# Patient Record
Sex: Male | Born: 1935 | Race: White | Hispanic: No | Smoking: Former smoker
Health system: Southern US, Community
[De-identification: ages and names within clinical notes are randomized; demographics above are authoritative.]

## PROBLEM LIST (undated history)

## (undated) DIAGNOSIS — Z950 Presence of cardiac pacemaker: Secondary | ICD-10-CM

## (undated) DIAGNOSIS — E782 Mixed hyperlipidemia: Secondary | ICD-10-CM

## (undated) DIAGNOSIS — I739 Peripheral vascular disease, unspecified: Secondary | ICD-10-CM

## (undated) DIAGNOSIS — I10 Essential (primary) hypertension: Secondary | ICD-10-CM

## (undated) DIAGNOSIS — F99 Mental disorder, not otherwise specified: Secondary | ICD-10-CM

## (undated) DIAGNOSIS — J309 Allergic rhinitis, unspecified: Secondary | ICD-10-CM

## (undated) DIAGNOSIS — E1129 Type 2 diabetes mellitus with other diabetic kidney complication: Secondary | ICD-10-CM

## (undated) DIAGNOSIS — R531 Weakness: Secondary | ICD-10-CM

## (undated) DIAGNOSIS — I442 Atrioventricular block, complete: Secondary | ICD-10-CM

## (undated) DIAGNOSIS — E1165 Type 2 diabetes mellitus with hyperglycemia: Secondary | ICD-10-CM

## (undated) DIAGNOSIS — D638 Anemia in other chronic diseases classified elsewhere: Secondary | ICD-10-CM

## (undated) DIAGNOSIS — IMO0001 Reserved for inherently not codable concepts without codable children: Secondary | ICD-10-CM

## (undated) DIAGNOSIS — D696 Thrombocytopenia, unspecified: Secondary | ICD-10-CM

## (undated) DIAGNOSIS — G629 Polyneuropathy, unspecified: Secondary | ICD-10-CM

## (undated) DIAGNOSIS — F0390 Unspecified dementia without behavioral disturbance: Secondary | ICD-10-CM

## (undated) DIAGNOSIS — J45909 Unspecified asthma, uncomplicated: Secondary | ICD-10-CM

## (undated) DIAGNOSIS — N400 Enlarged prostate without lower urinary tract symptoms: Secondary | ICD-10-CM

## (undated) DIAGNOSIS — I5042 Chronic combined systolic (congestive) and diastolic (congestive) heart failure: Secondary | ICD-10-CM

## (undated) HISTORY — PX: ROTATOR CUFF REPAIR: SHX139

## (undated) HISTORY — DX: Mixed hyperlipidemia: E78.2

## (undated) HISTORY — DX: Anemia in other chronic diseases classified elsewhere: D63.8

## (undated) HISTORY — DX: Unspecified asthma, uncomplicated: J45.909

## (undated) HISTORY — DX: Chronic combined systolic (congestive) and diastolic (congestive) heart failure: I50.42

## (undated) HISTORY — DX: Type 2 diabetes mellitus with hyperglycemia: E11.65

## (undated) HISTORY — DX: Allergic rhinitis, unspecified: J30.9

## (undated) HISTORY — DX: Presence of cardiac pacemaker: Z95.0

## (undated) HISTORY — DX: Polyneuropathy, unspecified: G62.9

## (undated) HISTORY — DX: Atrioventricular block, complete: I44.2

## (undated) HISTORY — DX: Weakness: R53.1

## (undated) HISTORY — DX: Peripheral vascular disease, unspecified: I73.9

## (undated) HISTORY — DX: Type 2 diabetes mellitus with other diabetic kidney complication: E11.29

## (undated) HISTORY — DX: Thrombocytopenia, unspecified: D69.6

## (undated) HISTORY — DX: Benign prostatic hyperplasia without lower urinary tract symptoms: N40.0

## (undated) HISTORY — DX: Essential (primary) hypertension: I10

## (undated) HISTORY — DX: Reserved for inherently not codable concepts without codable children: IMO0001

---

## 2000-06-16 ENCOUNTER — Ambulatory Visit (HOSPITAL_COMMUNITY): Admission: RE | Admit: 2000-06-16 | Discharge: 2000-06-16 | Payer: Self-pay | Admitting: Gastroenterology

## 2002-08-17 ENCOUNTER — Emergency Department (HOSPITAL_COMMUNITY): Admission: EM | Admit: 2002-08-17 | Discharge: 2002-08-17 | Payer: Self-pay | Admitting: Emergency Medicine

## 2002-11-29 ENCOUNTER — Observation Stay (HOSPITAL_COMMUNITY): Admission: RE | Admit: 2002-11-29 | Discharge: 2002-11-30 | Payer: Self-pay | Admitting: Orthopedic Surgery

## 2004-12-02 ENCOUNTER — Ambulatory Visit: Payer: Self-pay | Admitting: Internal Medicine

## 2004-12-03 ENCOUNTER — Ambulatory Visit (HOSPITAL_COMMUNITY): Admission: RE | Admit: 2004-12-03 | Discharge: 2004-12-04 | Payer: Self-pay | Admitting: Urology

## 2005-12-10 ENCOUNTER — Ambulatory Visit: Payer: Self-pay | Admitting: Internal Medicine

## 2006-10-28 ENCOUNTER — Ambulatory Visit: Payer: Self-pay | Admitting: Internal Medicine

## 2007-07-04 ENCOUNTER — Encounter: Admission: RE | Admit: 2007-07-04 | Discharge: 2007-07-04 | Payer: Self-pay | Admitting: Internal Medicine

## 2007-07-13 ENCOUNTER — Encounter: Admission: RE | Admit: 2007-07-13 | Discharge: 2007-10-11 | Payer: Self-pay | Admitting: Internal Medicine

## 2007-11-08 DIAGNOSIS — J42 Unspecified chronic bronchitis: Secondary | ICD-10-CM | POA: Insufficient documentation

## 2007-11-08 DIAGNOSIS — J309 Allergic rhinitis, unspecified: Secondary | ICD-10-CM | POA: Insufficient documentation

## 2007-11-08 DIAGNOSIS — G609 Hereditary and idiopathic neuropathy, unspecified: Secondary | ICD-10-CM | POA: Insufficient documentation

## 2007-11-09 ENCOUNTER — Ambulatory Visit: Payer: Self-pay | Admitting: Internal Medicine

## 2007-11-17 DIAGNOSIS — J45909 Unspecified asthma, uncomplicated: Secondary | ICD-10-CM

## 2007-11-17 DIAGNOSIS — I10 Essential (primary) hypertension: Secondary | ICD-10-CM

## 2008-06-04 ENCOUNTER — Emergency Department (HOSPITAL_COMMUNITY): Admission: EM | Admit: 2008-06-04 | Discharge: 2008-06-04 | Payer: Self-pay | Admitting: Emergency Medicine

## 2008-09-22 ENCOUNTER — Encounter: Admission: RE | Admit: 2008-09-22 | Discharge: 2008-09-22 | Payer: Self-pay | Admitting: Orthopedic Surgery

## 2008-10-15 ENCOUNTER — Ambulatory Visit (HOSPITAL_COMMUNITY): Admission: RE | Admit: 2008-10-15 | Discharge: 2008-10-16 | Payer: Self-pay | Admitting: Orthopedic Surgery

## 2008-11-08 ENCOUNTER — Ambulatory Visit: Payer: Self-pay | Admitting: Internal Medicine

## 2008-12-19 ENCOUNTER — Ambulatory Visit: Payer: Self-pay | Admitting: Internal Medicine

## 2008-12-25 ENCOUNTER — Telehealth: Payer: Self-pay | Admitting: Internal Medicine

## 2009-03-03 ENCOUNTER — Ambulatory Visit: Payer: Self-pay | Admitting: Surgery

## 2009-05-07 ENCOUNTER — Encounter: Admission: RE | Admit: 2009-05-07 | Discharge: 2009-05-07 | Payer: Self-pay | Admitting: Internal Medicine

## 2009-11-10 ENCOUNTER — Telehealth: Payer: Self-pay | Admitting: Internal Medicine

## 2009-12-09 ENCOUNTER — Encounter (INDEPENDENT_AMBULATORY_CARE_PROVIDER_SITE_OTHER): Payer: Self-pay | Admitting: Internal Medicine

## 2009-12-09 ENCOUNTER — Inpatient Hospital Stay (HOSPITAL_COMMUNITY)
Admission: EM | Admit: 2009-12-09 | Discharge: 2009-12-14 | Payer: Self-pay | Source: Home / Self Care | Admitting: Internal Medicine

## 2009-12-19 ENCOUNTER — Ambulatory Visit: Payer: Self-pay | Admitting: Internal Medicine

## 2010-03-03 NOTE — Assessment & Plan Note (Signed)
Summary: 12 months/apc   Primary Provider/Referring Provider:  Dr. Selena Batten  CC:  yearly follow up visit-requests flu shot.  History of Present Illness:   From 10/28/06: 1. Allergic rhinitis. 2. Chronic bronchitis. 3. Diabetes with peripheral neuropathy.   HISTORY:  He continues followup through the [place].    Cough has been much better and he used Tussionex only in latewinter.  Does use Combivent 2 puffs t.i.d. and rarely uses nebulizer, especially if he is exerting himself off of lisinopril.  We discussed lisinopril.  11/09/07- 1 yr f/u requesting flu vac. Med talk. Walking limited by back pain, up to 2 miles per day, rides stationary bike, treadmill for 40 minutes each day.  No routine cough.  Rarely needs nebulizer.  Uses Combivent inhaler t.i.d..  Denies sputum.  Had chest x-ray at primary office.  Has had pneumococcal vaccine twice. [facility] for meds.  November 08, 2008- Allergic rhinits, chronic bronchitis, DM/ peripheral neuropathy yearly follow up visit-requests flu shot 3 weeks s/p left rotator cuff surgery without respiratory complication. His allergy complaints are mild and controlled with loratadine. He uses Combivent three times a day and neb albuterol once / month. He notices dyspnea with stable exertion level "I'm satisfied". He denies heart problem or chest pain.   Allergies (verified): 1)  ! * Altace  Past History:  Past Surgical History: bilateral rotator cuffs  cystoscopic kidney stone removal  Social History: Patient states former smoker. - 69 Married with 2 children  Still working IT trainer  Review of Systems      See HPI       The patient complains of dyspnea on exertion.  The patient denies anorexia, fever, weight loss, weight gain, vision loss, decreased hearing, hoarseness, chest pain, syncope, peripheral edema, prolonged cough, headaches, hemoptysis, abdominal pain, melena, hematochezia, severe indigestion/heartburn, suspicious skin lesions, transient blindness, abnormal  bleeding, and enlarged lymph nodes.    Vital Signs:  Patient profile:   77 year old male Weight:      196 pounds O2 Sat:      98 % on Room air Pulse rate:   83 / minute BP sitting:   154 / 96  (right arm) Cuff size:   regular  Vitals Entered By: Reynaldo Minium CMA (November 08, 2008 9:02 AM)  O2 Flow:  Room air  Physical Exam  Additional Exam:  General: A/Ox3; pleasant and cooperative, NAD, SKIN: no rash, lesions NODES: no lymphadenopathy HEENT: [place]/AT, EOM- WNL, Conjuctivae- clear, PERRLA, TM-WNL, hearing aid,, Nose- clear, Throat- clear and wnl NECK: Supple w/ fair ROM, JVD- none, normal carotid impulses w/o bruits Thyroid- normal to palpation CHEST:Few crackles, unlabored without dullness. HEART: RRR, no m/g/r heard ABDOMEN: Soft and nl; nml bowel sounds; no organomegaly or masses noted ZOX:WRUE, nl pulses, no edema, left arm sling NEURO: Grossly intact to observation      Impression & Recommendations:  Problem # 1:  ASTHMA (ICD-493.90) Fair control with stable medication. We will want to get update CXR and PFT in a month or so when he is out of his sling.  Problem # 2:  ALLERGIC RHINITIS (ICD-477.9)  Loratadine gives satisfactory control.  His updated medication list for this problem includes:    Loratadine 10 Mg Tabs (Loratadine) .Marland Kitchen... Take 1 tablet by mouth once a day    Zyrtec Allergy 10 Mg Tabs (Cetirizine hcl) .Marland Kitchen... As needed    Benadryl 25 Mg Caps (Diphenhydramine hcl) ..... Use as directed as needed  Orders: Est. Patient Level III (  52841)  Other Orders: Future Orders: T-2 View CXR (71020TC) ... 12/13/2008 Pulmonary Referral (Pulmonary) ... 12/13/2008  Patient Instructions: 1)  Schedule appointment for 1 year follow-up, earlier if needed 2)  We are setting up PFT and CXR to be done next month, when you are out of your sling. You will be able to call for those results. 3)  Flu vax

## 2010-03-03 NOTE — Assessment & Plan Note (Signed)
Summary: rov/jd   Primary Provider/Referring Provider:  Dr. Selena Batten  CC:  Follow up visit.  History of Present Illness: HISTORY:  He continues followup through the [place].    Cough has been much better and he used Tussionex only in latewinter.  Does use Combivent 2 puffs t.i.d. and rarely uses nebulizer, especially if he is exerting himself off of lisinopril.  We discussed lisinopril.  11/09/07- 1 yr f/u requesting flu vac. Med talk. Walking limited by back pain, up to 2 miles per day, rides stationary bike, treadmill for 40 minutes each day.  No routine cough.  Rarely needs nebulizer.  Uses Combivent inhaler t.i.d..  Denies sputum.  Had chest x-ray at primary office.  Has had pneumococcal vaccine twice. [facility] for meds.  November 08, 2008- Allergic rhinits, chronic bronchitis, DM/ peripheral neuropathy yearly follow up visit-requests flu shot 3 weeks s/p left rotator cuff surgery without respiratory complication. His allergy complaints are mild and controlled with loratadine. He uses Combivent three times a day and neb albuterol once / month. He notices dyspnea with stable exertion level "I'm satisfied". He denies heart problem or chest pain.   December 19, 2009- Allergic rhinits, chronic bronchitis, DM/ peripheral neuropathy.........dtr here Nurse-CC: Follow up visit Just out of hospital for diabetes control with renal insufficiency- Cr 2.0 PFT- mild small airway obstruction w/ insignif resp to BD, otw WNL. FEV1/FVC 0.67 Using Advair rarely as a rescue med- not needed. He hasn't needed his nebulizer in 6 months. Uses Combivent rarely. He denies routine cough, dyspnea with usual activity, or waking from dyspnea. Had flu shot. Daughter observes he coughs less and is not short of breath.      Asthma History    Initial Asthma Severity Rating:    Age range: 12+ years    Symptoms: 0-2 days/week    Nighttime Awakenings: 0-2/month    Interferes w/ normal activity: no limitations    SABA use (not for  EIB): 0-2 days/week    Asthma Severity Assessment: Intermittent   Preventive Screening-Counseling & Management  Alcohol-Tobacco     Smoking Status: quit     Year Quit: 05-16-1965  Allergies: 1)  ! * Altace  Past History:  Past Surgical History: Last updated: 11/08/2008 bilateral rotator cuffs  cystoscopic kidney stone removal  Family History: Last updated: 11-27-2007 Mother- deceased age 27 Father- deceased age 22 Sibling 1- living age 62 Sibling 2- living age 31 Sibling 34- living age 49  Social History: Last updated: 11/08/2008 Patient states former smoker. - 49 Married with 2 children  Still working IT trainer  Risk Factors: Smoking Status: quit (12/19/2009)  Past Medical History: Allergic Rhinitis Asthma/ chronic bronchitis- PFT 12/19/08- FEV1 2.68/ 100%, FEV1/FVC 0.67, mild reversible small airway obst. Diabetes, Type 2 Hypertension BPH  Review of Systems      See HPI  The patient denies shortness of breath with activity, shortness of breath at rest, productive cough, non-productive cough, coughing up blood, chest pain, irregular heartbeats, acid heartburn, indigestion, loss of appetite, weight change, abdominal pain, difficulty swallowing, sore throat, tooth/dental problems, headaches, nasal congestion/difficulty breathing through nose, sneezing, itching, ear ache, rash, change in color of mucus, and fever.    Vital Signs:  Patient profile:   77 year old male Weight:      180 pounds O2 Sat:      99 % on Room air Pulse rate:   81 / minute BP sitting:   110 / 64  (left arm) Cuff size:   regular  Vitals Entered By: Reynaldo Minium CMA (December 19, 2009 9:28 AM)  O2 Flow:  Room air CC: Follow up visit   Physical Exam  Additional Exam:  General: A/Ox3; pleasant and cooperative, NAD, SKIN: no rash, lesions NODES: no lymphadenopathy HEENT: [place]/AT, EOM- WNL, Conjuctivae- clear, PERRLA, TM-WNL, hearing aid,, Nose- clear, Throat- clear and wnl.m II-III NECK:  Supple w/ fair ROM, JVD- none, normal carotid impulses w/o bruits Thyroid- normal to palpation CHEST:Few crackles, unlabored without dullness. HEART: RRR, no m/g/r heard ABDOMEN:medium build ZOX:WRUE, nl pulses, no edema NEURO: Grossly intact to observation      CXR  Procedure date:  12/19/2008  Findings:      DG CHEST 2 VIEW - [id number]   Clinical Data: bronchitis   CHEST - 2 VIEW   Comparison: Chest radiograph 10/11/2008   Findings: Normal mediastinum and heart silhouette.  Costophrenic angles are clear.  No evidence effusion, infiltrates, or pneumothorax. Bronchitic markings are stable.   IMPRESSION:   1.  No acute cardiopulmonary process. 2.  Central bronchitic markings   Read By:  Genevive Bi,  M.D.     Released By:  Genevive Bi,  M.D.  Impression & Recommendations:  Problem # 1:  BRONCHITIS, CHRONIC (ICD-491.9)  He denies routine cough, phlegm, wheeze or dyspnea now and pulmonary status seems under good control. He doesn't need a maintenance inhaler.  I suggested he follow with Dr Selena Batten and I will be happy to see as needed.  Other Orders: Est. Patient Level IV (91478)  Patient Instructions: 1)  Please schedule a follow-up appointment as needed. I will be happy to see you as needed. Dr Selena Batten can manage your routine needs and refill yuour meds. 2)  You don't seem to need Advair now.  3)  Suggest you keep Combivent available as your rescue inhaler for occasional use, and a small amount of medicine for your nebulizer machine.  4)  cc Dr Selena Batten   CXR  Procedure date:  12/19/2008  Findings:      DG CHEST 2 VIEW - [id number]   Clinical Data: bronchitis   CHEST - 2 VIEW   Comparison: Chest radiograph 10/11/2008   Findings: Normal mediastinum and heart silhouette.  Costophrenic angles are clear.  No evidence effusion, infiltrates, or pneumothorax. Bronchitic markings are stable.   IMPRESSION:   1.  No acute cardiopulmonary process. 2.  Central  bronchitic markings   Read By:  Genevive Bi,  M.D.     Released By:  Genevive Bi,  M.D.

## 2010-03-03 NOTE — Progress Notes (Signed)
Summary: nos appt  Phone Note Call from Patient   Caller: [email or web address]  Call For: Rajanee Schuelke Summary of Call: Rsc nos from 10/7 to 11/18. Initial call taken by: Darletta Moll,  November 10, 2009 9:39 AM

## 2010-04-14 LAB — URINALYSIS, ROUTINE W REFLEX MICROSCOPIC
Bilirubin Urine: NEGATIVE
Glucose, UA: >1000 mg/dL — AB
Hgb urine dipstick: NEGATIVE
Ketones, ur: NEGATIVE mg/dL
Leukocytes, UA: NEGATIVE
Nitrite: NEGATIVE
Protein, ur: NEGATIVE mg/dL
Specific Gravity, Urine: 1.011 (ref 1.005–1.030)
Urobilinogen, UA: 0.2 mg/dL (ref 0.0–1.0)
pH: 5.5 (ref 5.0–8.0)

## 2010-04-14 LAB — UIFE/LIGHT CHAINS/TP QN, 24-HR UR
Albumin, U: DETECTED
Alpha 1, Urine: DETECTED — AB
Alpha 2, Urine: DETECTED — AB
Beta, Urine: DETECTED — AB
Free Kappa Lt Chains,Ur: 16.8 mg/dL — ABNORMAL HIGH (ref 0.04–1.51)
Free Lambda Lt Chains,Ur: 1.18 mg/dL — ABNORMAL HIGH (ref 0.08–1.01)
Gamma Globulin, Urine: DETECTED — AB
Total Protein, Urine: 18.7 mg/dL

## 2010-04-14 LAB — GLUCOSE, CAPILLARY
Glucose-Capillary: 101 mg/dL — ABNORMAL HIGH (ref 70–99)
Glucose-Capillary: 138 mg/dL — ABNORMAL HIGH (ref 70–99)
Glucose-Capillary: 155 mg/dL — ABNORMAL HIGH (ref 70–99)
Glucose-Capillary: 171 mg/dL — ABNORMAL HIGH (ref 70–99)
Glucose-Capillary: 173 mg/dL — ABNORMAL HIGH (ref 70–99)
Glucose-Capillary: 232 mg/dL — ABNORMAL HIGH (ref 70–99)
Glucose-Capillary: 349 mg/dL — ABNORMAL HIGH (ref 70–99)
Glucose-Capillary: 350 mg/dL — ABNORMAL HIGH (ref 70–99)
Glucose-Capillary: 393 mg/dL — ABNORMAL HIGH (ref 70–99)
Glucose-Capillary: 46 mg/dL — ABNORMAL LOW (ref 70–99)
Glucose-Capillary: 60 mg/dL — ABNORMAL LOW (ref 70–99)
Glucose-Capillary: 73 mg/dL (ref 70–99)
Glucose-Capillary: 93 mg/dL (ref 70–99)

## 2010-04-14 LAB — CBC
HCT: 43 % (ref 39.0–52.0)
Hemoglobin: 15.1 g/dL (ref 13.0–17.0)
MCH: 32.2 pg (ref 26.0–34.0)
MCHC: 35.1 g/dL (ref 30.0–36.0)
MCV: 91.7 fL (ref 78.0–100.0)
Platelets: 132 10*3/uL — ABNORMAL LOW (ref 150–400)
RBC: 4.69 MIL/uL (ref 4.22–5.81)
RDW: 13.8 % (ref 11.5–15.5)
WBC: 7.3 10*3/uL (ref 4.0–10.5)

## 2010-04-14 LAB — PROTEIN ELECTROPHORESIS, SERUM
Albumin ELP: 61.3 % (ref 55.8–66.1)
Alpha-1-Globulin: 3.8 % (ref 2.9–4.9)
Alpha-2-Globulin: 11.6 % (ref 7.1–11.8)
Beta 2: 5.3 % (ref 3.2–6.5)
Beta Globulin: 5.1 % (ref 4.7–7.2)
Gamma Globulin: 12.9 % (ref 11.1–18.8)
M-Spike, %: NOT DETECTED g/dL
Total Protein ELP: 7.5 g/dL (ref 6.0–8.3)

## 2010-04-14 LAB — COMPREHENSIVE METABOLIC PANEL
ALT: 16 U/L (ref 0–53)
AST: 20 U/L (ref 0–37)
Albumin: 4 g/dL (ref 3.5–5.2)
Alkaline Phosphatase: 73 U/L (ref 39–117)
BUN: 67 mg/dL — ABNORMAL HIGH (ref 6–23)
CO2: 23 mEq/L (ref 19–32)
Calcium: 8.9 mg/dL (ref 8.4–10.5)
Chloride: 92 mEq/L — ABNORMAL LOW (ref 96–112)
Creatinine, Ser: 6.14 mg/dL — ABNORMAL HIGH (ref 0.4–1.5)
GFR calc Af Amer: 11 mL/min — ABNORMAL LOW (ref >60)
GFR calc non Af Amer: 9 mL/min — ABNORMAL LOW (ref >60)
Glucose, Bld: 297 mg/dL — ABNORMAL HIGH (ref 70–99)
Potassium: 4 mEq/L (ref 3.5–5.1)
Sodium: 129 mEq/L — ABNORMAL LOW (ref 135–145)
Total Bilirubin: 0.9 mg/dL (ref 0.3–1.2)
Total Protein: 7.4 g/dL (ref 6.0–8.3)

## 2010-04-14 LAB — SODIUM, URINE, RANDOM: Sodium, Ur: 25 mEq/L

## 2010-04-14 LAB — BASIC METABOLIC PANEL
BUN: 43 mg/dL — ABNORMAL HIGH (ref 6–23)
CO2: 24 mEq/L (ref 19–32)
CO2: 26 mEq/L (ref 19–32)
Calcium: 8.3 mg/dL — ABNORMAL LOW (ref 8.4–10.5)
Calcium: 8.4 mg/dL (ref 8.4–10.5)
Creatinine, Ser: 1.81 mg/dL — ABNORMAL HIGH (ref 0.4–1.5)
GFR calc Af Amer: 27 mL/min — ABNORMAL LOW (ref >60)
GFR calc Af Amer: 39 mL/min — ABNORMAL LOW (ref >60)
GFR calc non Af Amer: 22 mL/min — ABNORMAL LOW (ref >60)
Glucose, Bld: 86 mg/dL (ref 70–99)
Potassium: 3.3 mEq/L — ABNORMAL LOW (ref 3.5–5.1)
Sodium: 137 mEq/L (ref 135–145)
Sodium: 138 mEq/L (ref 135–145)

## 2010-04-14 LAB — PTH-RELATED PEPTIDE

## 2010-04-14 LAB — URINE MICROSCOPIC-ADD ON

## 2010-04-14 LAB — PHOSPHORUS: Phosphorus: 6.1 mg/dL — ABNORMAL HIGH (ref 2.3–4.6)

## 2010-04-14 LAB — MAGNESIUM: Magnesium: 2.8 mg/dL — ABNORMAL HIGH (ref 1.5–2.5)

## 2010-04-14 LAB — CREATININE, URINE, RANDOM: Creatinine, Urine: 76.3 mg/dL

## 2010-04-14 LAB — CK: Total CK: 92 U/L (ref 7–232)

## 2010-05-08 LAB — PROTIME-INR
INR: 1 (ref 0.00–1.49)
Prothrombin Time: 13 seconds (ref 11.6–15.2)

## 2010-05-08 LAB — COMPREHENSIVE METABOLIC PANEL
AST: 33 U/L (ref 0–37)
Albumin: 4.5 g/dL (ref 3.5–5.2)
CO2: 25 mEq/L (ref 19–32)
Calcium: 9.4 mg/dL (ref 8.4–10.5)
Creatinine, Ser: 1.6 mg/dL — ABNORMAL HIGH (ref 0.4–1.5)
GFR calc Af Amer: 51 mL/min — ABNORMAL LOW (ref >60)
GFR calc non Af Amer: 42 mL/min — ABNORMAL LOW (ref >60)

## 2010-05-08 LAB — DIFFERENTIAL
Eosinophils Relative: 2 % (ref 0–5)
Lymphocytes Relative: 17 % (ref 12–46)
Lymphs Abs: 1 10*3/uL (ref 0.7–4.0)
Neutro Abs: 4.3 10*3/uL (ref 1.7–7.7)

## 2010-05-08 LAB — GLUCOSE, CAPILLARY
Glucose-Capillary: 143 mg/dL — ABNORMAL HIGH (ref 70–99)
Glucose-Capillary: 84 mg/dL (ref 70–99)

## 2010-05-08 LAB — URINALYSIS, ROUTINE W REFLEX MICROSCOPIC
Nitrite: NEGATIVE
Protein, ur: NEGATIVE mg/dL
Urobilinogen, UA: 0.2 mg/dL (ref 0.0–1.0)

## 2010-05-08 LAB — CBC
MCHC: 34.7 g/dL (ref 30.0–36.0)
MCV: 97.8 fL (ref 78.0–100.0)
Platelets: 138 10*3/uL — ABNORMAL LOW (ref 150–400)

## 2010-06-10 ENCOUNTER — Other Ambulatory Visit: Payer: Self-pay | Admitting: Internal Medicine

## 2010-06-10 DIAGNOSIS — E041 Nontoxic single thyroid nodule: Secondary | ICD-10-CM

## 2010-06-15 ENCOUNTER — Inpatient Hospital Stay: Admission: RE | Admit: 2010-06-15 | Payer: Self-pay | Source: Ambulatory Visit

## 2010-06-16 NOTE — Procedures (Signed)
DUPLEX DEEP VENOUS EXAM - LOWER EXTREMITY   INDICATION:  Venous stasis, blue feet.   HISTORY:  Edema:  No  Trauma/Surgery:  No  Pain:  Right lower extremity  PE:  No  Previous DVT:  No  Anticoagulants:  No  Other:  Patient complains of speckles in the bilateral lower  extremities.   DUPLEX EXAM:                CFV   SFV   PopV  PTV    GSV                R  L  R  L  R  L  R   L  R  L  Thrombosis    o  o  o  o  o  o  o   o  o  o  Spontaneous   +  +  +  +  +  +  +   +  +  +  Phasic        +  +  +  +  +  +  +   +  +  +  Augmentation  +  +  +  +  +  +  +   +  +  +  Compressible  +  +  +  +  +  +  +   +  +  +  Competent     o  o  o  o  o  o  +   +  +  +   Legend:  + - yes  o - no  p - partial  D - decreased   IMPRESSION:  1. No evidence of thrombus noted in the bilateral lower extremities.  2. Reflux is noted in the bilateral common femoral, superficial      femoral, and popliteal veins.    _____________________________  V. Charlena Cross, MD   CH/MEDQ  D:  03/04/2009  T:  03/05/2009  Job:  [id number]

## 2010-06-16 NOTE — Assessment & Plan Note (Signed)
OFFICE VISIT   WING, SCHOCH  DOB:  10/26/1928                                       03/03/2009  ZOXWR#:[id number]   REASON FOR VISIT:  Skin color change, both legs.   HISTORY:  This is a 77 year old gentleman I am seeing at the request of  Dr. Selena Batten for evaluation of hyperpigmentation and blue toes on both lower  extremities.  The patient states that this has been present for quite  some time without significant change.  He denies having any swelling.  He does complains of some pain in both of his ankles that is described  as annoying, constant and dull in nature.  He also has right hip pain.   The patient has a longstanding history of diabetes, hypertension and  hypercholesterolemia, all of which are medically managed.   FAMILY HISTORY:  Negative for cardiovascular disease at an early age.   SOCIAL HISTORY:  He is widowed with two children.  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  Does not smoke.  Has a history smoking, quit in 1967.  Does  not drink alcohol.   REVIEW OF SYSTEMS:  CARDIAC:  Positive for shortness of breath with  exertion.  VASCULAR:  Positive for pain in legs.  All other systems are negative as documented in the encounter form.   ALLERGIES:  None.   PHYSICAL EXAMINATION:  Heart rate 76, blood pressure 117/73, O2  saturation is 99%.  Generally, he is well-appearing, in no distress.  HEENT is normal.  Respirations are nonlabored.  Cardiovascular:  He has  palpable dorsalis pedis and posterior tibial pulses bilaterally and  palpable popliteal pulses.  Musculoskeletal is without major  deformities.  He does have hyperpigmentation in both lower extremities  from the knee down.  There is mild cyanosis of the toes.  Neurologically, he does not any focal deficits.  Skin is without rash  and without ulceration.   DIAGNOSTIC STUDIES:  Vein mapping was ordered and independently  reviewed.  This shows reflux in both common femoral and superficial  femoral veins.   ASSESSMENT:  Mild venous insufficiency.   PLAN:  At this point the patient does not have significant edema.  He  has hyperpigmentation in both legs.  I have talked to him about  compression stockings and leg elevation; however, without significant  swelling and/or ulceration, I do not think he would benefit from these  at this time.  Certainly, in the future if this progresses, this would  be the next course of action.  He will follow up with me on a p.r.n.  basis.     Jorge Ny, MD  Electronically Signed   VWB/MEDQ  D:  03/03/2009  T:  03/04/2009  Job:  [id number]   cc:   Massie Maroon, MD

## 2010-06-16 NOTE — Assessment & Plan Note (Signed)
[facility]                             PULMONARY OFFICE NOTE   NAME:White, Dakota YOUTZ                      MRN:          [id number]  DATE:10/28/2006                            DOB:          04/06/1928    PULMONARY FOLLOWUP   PROBLEM LIST:  1. Allergic rhinitis.  2. Chronic bronchitis.  3. Diabetes with peripheral neuropathy.   HISTORY:  He continues followup through the [place].  With Dr. Bascom Levels  retirement, he is establishing with a different physician for primary  care.  Cough has been much better and he used Tussionex only in late  winter.  Does use Combivent 2 puffs t.i.d. and rarely uses nebulizer,  especially if he is exerting himself off of lisinopril.  We discussed  lisinopril.   MEDICATIONS:  1. Nebulized albuterol 0.83 mg q.i.d. p.r.n.  2. Lantus.  3. Lisinopril 40 mg.  4. Avandia 80 mg.  5. Finasteride 5 mg.  6. Aspirin 81 mg.  7. Loratadine 10 mg p.r.n.  8. Zyrtec.  9. Testosterone.  10.Lyrica.  11.P.r.n. use of Combivent.  12.Promethazine with codeine.  13.Tussionex.   DRUG INTOLERANCES:  ALTACE.   OBJECTIVE:  Weight 198 pounds, BP 120/66, pulse 76, room air saturation  96%.  He looks comfortable and his chest is very clear.  There is no post-nasal drainage visible.  No stridor.  Voice quality is  good.  HEART:  Sounds are regular.  No cyanosis, clubbing, or edema.   IMPRESSION:  Allergic rhinitis and bronchitis.   PLAN:  Flu vaccine discussed and given.  Tussionex refilled.  Schedule  return in 1 year, earlier p.r.n.     Clinton D. Maple Hudson, MD, Tonny Bollman, FACP  Electronically Signed    CDY/MedQ  DD: 10/28/2006  DT: 10/29/2006  Job #: [id number]   cc:   Juluis Rainier, M.D., Deboraha Sprang at [facility]

## 2010-06-19 NOTE — Assessment & Plan Note (Signed)
[facility]                               PULMONARY OFFICE NOTE   NAME:Dakota White, Dakota White                      MRN:          [id number]  DATE:12/10/2005                            DOB:          1929-01-12    PULMONARY/ALLERGY FOLLOWUP   PROBLEMS:  1. Allergic rhinitis.  2. Chronic bronchitis.  3. Diabetes with peripheral neuropathy.   HISTORY:  He continues Combivent using 2 puffs b.i.d. and only very rarely  using his nebulized albuterol.  He asks to keep a prescription of Tussionex  available and I agreed after discussion.  Dr. Janey Greaser has handled some minor  bronchitis flares, but overall, Mr. Vansickle notes little change in exercise  tolerance or cough, and feels pretty functional.  He is going to water  aerobics 3 times a week.  He has had pneumococcal vaccine twice and flu  vaccine for this year.  He is satisfied with his current status.   MEDICATIONS:  1. Home nebulizer with albuterol.  2. Insulin.  3. Lisinopril 40 mg.  4. Avandia 8 mg.  5. Zocor 80 mg.  6. Finasteride 5 mg.  7. Aspirin 81 mg.  8. Loratadine 10 mg or Zyrtec.  9. Testosterone injection twice a month.  10.Lyrica.  11.Combivent rescue inhaler.  12.Phenergan with codeine cough syrup or Tussionex used very occasionally.   DRUG INTOLERANCES:  ALTACE.   OBJECTIVE:  Weight 203 pounds, BP 146/72, pulse regular 68, room air  saturation 98%.  I heard a few trace crackles right base.  There was no cough with deep  breath or forced exhalation and he looked comfortable.  HEART:  Sounds were regular with no neck vein distension or edema.  Moderate abdominal obesity.  I saw no evidence of significant nasal congestion or post-nasal drip today.   IMPRESSION:  Allergic rhinitis and asthmatic bronchitis are controlled.  We  have discussed the potential for his angiotensin-converting enzyme inhibitor  to cause cough.  We reviewed his past history of positive allergy skin  testing  without allergy vaccine trial.  He is satisfied with his present  status recognizing maintenance use of a short-term bronchodilator as  maintenance therapy without use of an anti-inflammatory medication.  After  discussion we have chosen to leave him where he is for now, but if he gets  worse, appropriate adjustments in medications should be considered.  Schedule return in 1 year, earlier p.r.n.     Clinton D. Maple Hudson, MD, Tonny Bollman, FACP  Electronically Signed    CDY/MedQ  DD: 12/12/2005  DT: 12/12/2005  Job #: [id number]   cc:   Al Decant. Janey Greaser, MD  Claudette Laws, M.D.

## 2010-06-19 NOTE — Discharge Summary (Signed)
[name], Dakota White               ACCOUNT NO.:  [id number]   MEDICAL RECORD NO.:  [id number]          PATIENT TYPE:  OIB   LOCATION:  1431                         FACILITY:  [facility]   PHYSICIAN:  Claudette Laws, M.D.  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  Over the years, he has tried various regimens, including the  phosphodiesterase inhibitors without much success.  Recently, he was on  penile injections with prostaglandin.  He has also tried the vacuum device  without much success.  Patient otherwise is rather active.  We discussed the  possibility of inflatable penile prosthesis preoperatively.  We discussed  this in detail.  A decision was made to put in a three piece inflatable  penile prosthesis.  We discussed the operation in detail, including possible  complications such as postop bleeding and infection, which may require  removal of the prosthesis.  He was counseled that approximately 8% of  diabetics end up with infection.  He understands and agreed to the proposed  surgery.   His chest x-ray showed no acute changes.  He does have mild COPD, for which  he sees Dr. Jetty Duhamel.  His EKG showed a normal sinus rhythm.   LABORATORY DATA:  His electrolytes were normal.  His BUN was 23.  Creatinine  1.4.  His hemoglobin was 12.9, hematocrit 37.7.   HOSPITAL COURSE:  The patient came in on the morning of the day of surgery  and underwent insertion of a three piece AMS CX100 penile prosthesis without  incident.  Postop, he was left with a Foley catheter, which we removed the  next day.  He was sent home with the incision dry.  He was able to void on  his own.   FINAL DIAGNOSES:  1.  Organic erectile dysfunction.  2.   Insulin-dependent diabetes mellitus.  3.  History of chronic obstructive pulmonary disease.   OPERATION:  Insertion of a three piece inflatable AMS CX penile prosthesis.   COMPLICATIONS:  None.   CONDITION ON DISCHARGE:  Stable.   DISCHARGE MEDICATIONS:  To include Vicodin 1-2 every 4 hours #40 p.r.n.  pain, Cipro 500 mg 1 b.i.d. #14, and also to renew all of his home  medications, including albuterol sulfate 0.5 mg as needed, Combivent 2 puffs  three times a day, __________ insulin 50 units a day, lisinopril 40 mg  daily, Rosiglitazone/Avandia 8 mg once daily, Zocor 80 mg daily.   DISPOSITION:  Regular diet.  Force fluids.  Limited activity.  He was  instructed to keep the penis up on his abdomen for the next three weeks and  not to engage in any sexual activity for at least six weeks.   He is to come back in the office in one week for followup.      Claudette Laws, M.D.  Electronically Signed  RFS/MEDQ  D:  12/04/2004  T:  12/04/2004  Job:  [id number]

## 2010-06-19 NOTE — Op Note (Signed)
[place]  Patient:    HERSEY, Dakota White                        MRN: [id number] Proc. Date: 06/16/00 Attending:  Verlin Grills, M.D. CC:         Al Decant. Janey Greaser, M.D.                           Operative Report  PROCEDURE PERFORMED:  Surveillance colonoscopy  INDICATION FOR PROCEDURE:  Dakota White (date of birth Dec 15, 1928) is a 77 year old male who is due for surveillance colonoscopy and possible polypectomy to prevent colon cancer.  ENDOSCOPIST:  Verlin Grills, M.D.  PREMEDICATION:  Fentanyl 50 mcg, Versed 5 mg.  ENDOSCOPE:  Olympus pediatric colonoscope.  DESCRIPTION OF PROCEDURE:  After obtaining informed consent, the patient was placed in the left lateral decubitus position.  I administered intravenous Fentanyl and intravenous Versed to achieve conscious sedation for the procedure.  The patients blood pressure, oxygen saturation and cardiac rhythm were monitored throughout the procedure and documented in the medical record.  Anal inspection was normal.  Digital rectal exam revealed a non-nodular prostate.  The Olympus pediatric video colonoscope was introduced into the rectum and easily advanced to the cecum.  Colonic preparation for the exam today was excellent.  Rectum normal.  Sigmoid colon and descending colon:  Left colonic diverticulosis without diverticulitis or stricture.  Splenic flexure normal.  Transverse colon normal.  Hepatic flexure normal.  Ascending colon normal.  Cecum and ileocecal valve normal.  ASSESSMENT:  Left colonic diverticulosis; otherwise normal proctocolonoscopy to the cecum.  No endoscopic evidence for the presence of colorectal neoplasia.  RECOMMENDATIONS:  Repeat colonoscopy in five - ten years. DD:  06/16/00 TD:  06/16/00 Job: [id number]

## 2010-06-19 NOTE — Op Note (Signed)
NAME:  RIEN, MARLAND                         ACCOUNT NO.:  [id number]   MEDICAL RECORD NO.:  [id number]                   PATIENT TYPE:  AMB   LOCATION:  DAY                                  FACILITY:  [facility]   PHYSICIAN:  Vania Rea. Supple, M.D.               DATE OF BIRTH:  12-Sep-1928   DATE OF PROCEDURE:  11/29/2002  DATE OF DISCHARGE:                                 OPERATIVE REPORT   PREOPERATIVE DIAGNOSES:  1. Right shoulder full-thickness retracted rotator cuff tear.  2. Right shoulder acromioclavicular joint arthrosis.   POSTOPERATIVE DIAGNOSES:  1. Right shoulder full-thickness retracted rotator cuff tear.  2. Right shoulder acromioclavicular joint arthrosis.   PROCEDURES:  1. Open right shoulder rotator cuff repair.  2. Open subacromial decompression.  3. Open distal clavicle resection.   SURGEON OF RECORD:  Vania Rea. Supple, M.D.   Threasa HeadsFrench Ana A. Shuford, P.A.-C.   ANESTHESIA:  General endotracheal as well as a preoperative interscalene  block.   ESTIMATED BLOOD LOSS:  Minimal.   DRAINS:  None.   HISTORY:  Mr. Youngberg is a 77 year old gentleman who has had persistent right  shoulder pain with limitations in motion which has been refractory to  attempts at conservative management.  His clinical examination shows  weakness of testing of the rotator cuff, and preoperative MRI scan confirms  a large retracted tear of the rotator cuff as well as advanced AC joint  arthrosis.  Due to his ongoing pain and functional limitations, he is  brought to the operating room at this time for planned right shoulder  surgery as described below.   Preoperatively I counseled Mr. Forero on treatment options as well as risks  versus benefits thereof.  Possible surgical complications of bleeding,  infection, neurovascular injury, persistence of pain, loss of motion,  recurrence of rotator cuff tear, potential anesthetic complications, and  possible need for additional surgery  are reviewed.  He understands and  accepts and agrees with our planned procedure.   PROCEDURE IN DETAIL:  After undergoing routine preoperative evaluation, the  patient received prophylactic antibiotics and interscalene block established  in the holding area by the anesthesia department.  Placed supine on the  operating table and underwent smooth induction of general endotracheal  anesthesia.  Placed in the beach chair position, appropriately padded and  protected.  The right shoulder girdle region was sterilely prepped and  draped in a standard fashion.  The proposed skin incision was outlined  beginning at the [facility] joint and extending laterally and distally for the  length of approximately 6 cm.  Skin flaps were elevated medially and  laterally with sharp dissection and electrocautery was used for hemostasis.  We should mention that we did place 5 mL of 0.5% Marcaine with epinephrine  along the proposed skin incision at the beginning of the case to improve  hemostasis.  The deep fascia was identified in  line with the skin incision,  developing the interval between the anterior and mid-thirds of the deltoid.  The deltoid was split along the course of its fibers and then careful  subperiosteal elevation was used to expose the anterior acromion and the  distal clavicle.  An oscillating saw was then used to perform a distal  clavicle resection, removing approximately 1 cm of bone.  Palpation  confirmed that there was appropriate decompression of the subacromial space  at the level of the distal clavicle.  We then performed a subacromial  bursectomy and then used an oscillating saw to perform an acromionectomy,  creating a type 1 morphology.  There had been a previous surgical procedure  on the [facility] joint and anterior deltoid and multiple Ethibond sutures were  identified, and these were removed.  We then completed a  subdeltoid/subacromial bursectomy and meticulously removed all adhesions  from  the subacromial space.  The rotator cuff was inspected and there was  found to be a large longitudinal split between the supraspinatus and  infraspinatus extending back all the way to the glenoid.  However, the  supraspinatus and infraspinatus maintained distal attachments and had good  elasticity as the tendons were placed under traction.  Anteriorly there  appeared to be a small defect at the rotator interval between the  subscapularis and supraspinatus.  We mobilized this region and the  subscapularis also appeared to be in good condition.  The biceps tendon was  identified, found to be in good condition.  After the rotator cuff was  completely mobilized and freed up of adhesions, we then placed two figure-of-  eight sutures for closure of the rotator interval defect using #2 Fibrewire.  We then trimmed the torn margins of the supraspinatus and infraspinatus back  to the level of the glenoid, removing all apparently necrotic and  degenerative tissue.  We then placed two figure-of-eight side-by-side repair  sutures, and this nicely reapproximated the defect between the supraspinatus  and infraspinatus.  More distally at the greater tuberosity insertion, there  was a large exposed portion of the greater tuberosity and we used an  osteotome to create a small bony cancellous bed approximately 2 cm wide by 1  cm long, just off the osteoarticular junction.  We placed an Arthrex  Biocorkscrew suture anchor in the center of this and then brought the limbs  up through the adjacent portions of the supraspinatus and infraspinatus.  All these sutures were then tied beginning with the rotator interval sutures  and then the side-to-side sutures in the supraspinatus and infraspinatus,  and then the suture anchor sutures.  This allowed excellent reapposition of  all aspects of the rotator cuff with excellent coverage of the humeral head. We did reinforce the repair at the greater tuberosity with an  additional  figure-of-eight Vicryl suture, #1.  The arm was taken through a range of  motion and no evidence for excessive tension on the repair.  Final  irrigation was performed.  Hemostasis was obtained.  The deltoid split was  then closed initially with a #2 Fibrewire, bringing the upper margin of the  anterior deltoid back through bony tunnels on the anterior acromion.  The  remaining aspects of the deltoid split were closed with interrupted figure-  of-eight #1 Vicryl sutures.  Vicryl 2-0 used for the subcu and  intracuticular 3-0 Monocryl used for the skin, followed by Steri-Strips.  Bulky dry dressing was placed.  The arm was placed in a shoulder  immobilizer.  The patient was then placed supine, extubated, and taken to  the recovery room in stable condition.                                               Vania Rea. Supple, M.D.    KMS/MEDQ  D:  11/29/2002  T:  11/29/2002  Job:  [id number]

## 2010-06-19 NOTE — Op Note (Signed)
Dakota White, Dakota White               ACCOUNT NO.:  [id number]   MEDICAL RECORD NO.:  [id number]          PATIENT TYPE:  AMB   LOCATION:  DAY                          FACILITY:  [facility]   PHYSICIAN:  Claudette Laws, M.D.  DATE OF BIRTH:  Feb 20, 1928   DATE OF PROCEDURE:  12/03/2004  DATE OF DISCHARGE:                                 OPERATIVE REPORT   PREOPERATIVE DIAGNOSIS:  Erectile dysfunction.   POSTOPERATIVE DIAGNOSIS:  Erectile dysfunction.   PROCEDURE:  Placement of inflatable penile prosthesis using infrapubic  approach.   SURGEON:  Claudette Laws, MD.   ASSISTANT:  Glade Nurse, MD.   ANESTHESIA:  General endotracheal.   IMPLANT:  AMS 700CX 15 cm inflatable penile prosthesis with bilateral 2 cm  rear tip extension.   DESCRIPTION OF PROCEDURE:  The patient was identified by his wrist bracelet  and brought to room 10 where he received preprocedural antibiotics and was  administered general anesthesia. He was prepped and draped in the usual  sterile fashion taking care to minimize the risk of peripheral neuropathy  and compartment syndrome. Next, the patient underwent a 10-minute prep.  Following this, we placed a Foley catheter to the level of his urinary  bladder. His bladder was drained and the Foley catheter was capped. Next, we  made a 5 cm infrapubic incision extending from the base of his penis at the  midline to the area just over his pubic symphysis. We dissected down through  the dermis, subcutaneous fat and Scarpa's fascia to the level of the  anterior fascial sheath with Bovie cautery. Hemostasis was maintained  throughout. Next at the superior apex of the incision, we made a 1.5-cm  incision in the anterior sheath with Bovie cautery. Next with the surgeon's  first finger we developed a plane between the bodies of the rectus abdominis  and placed the surgeon's finger in the space of Retzius. We developed the  space bluntly so it would accommodate the  reservoir. Next we placed our 60  mL reservoir in the aforementioned space. We inflated it with 60 mL of  sterile normal saline and then we checked for back pressure and there was no  back pressure. We then removed 5 mL of sterile saline and shotted the  tubing. Next, we closed the fascia with interrupted figure-of-eight 2-0  Vicryl being careful not to involve the tubing over the reservoir pump in  our closure. Following this, we bluntly developed the inferior aspect of the  incision exposing the corporal bodies. We were able to bluntly separate  areolar tissue over the base of the penis. Being cognizant to palpate the  Foley catheter and the urethra, we palpated and exposed the corporal bodies  in the inferior portion of the incision. Next, we placed stay sutures  adjacent to our planned corporotomy sites on each corpora. These were done  with interrupted 2-0 Vicryl's approximately 0.5 cm apart. This was done at  the same level bilaterally. We then made a corporotomy between each pair of  stay sutures with Bovie cautery. The corporotomy was approximately 1 cm in  length. We then started our proximal and distal dissection with Metzenbaum  scissors. We then used the Mentor dilators bilaterally dilating bilaterally  from 8 sequentially to 14-French with the Mentor dilators. This was done  without any great resistance. We were careful to dilate to the glans penis  bilaterally and all the dilations posteriorly. The dilator seated nicely on  the ischium. Next, we bluntly dissected a subdartos pouch along the right  hemiscrotum to the most dependent portion of the scrotum with the surgeon's  first and second finger. We everted the scrotum to bring the subdartos pouch  out the infrapubic incision. We then checked for hemostasis which was noted  to be excellent. Next, we measured each corporal length. When measuring to  the mid glandular level, they were found to be 17 cm bilaterally. Next the   three-piece device was prepped off the field. Next using a furlough, we  passed the right corporal cylinder into place. We then placed a 2 cm rear  tip extender and it seated nicely proximally and distally. This was then  repeated on the left. Next we inflated the corporal cylinders with  approximately 30 mL of sterile normal saline. The corporotomy was then  inspected and there was no buckling of the device noted. The contour of the  erect phallus was noted to be excellent. However known preoperatively there  was some Peyronie's disease with a mild 10-15 degree curvature to the left.  Penile modeling was then carried out and the penis was noted to be markedly  straight after this was completed. Next we again irrigated the wound and the  protective sleeves over the tubing were removed. Following this, we closed  our corporotomy using the preplaced sutures. Each corporotomy required a  distal interrupted 2-0 Vicryl placed along the proximal aspect of the  corporotomy. We used the blue plastic instrument included in the three-piece  device kit to ensure we did not damage the corporal cylinder on our closure.  Next, we placed the pump in our aforementioned subdartos pouch. Next, we  trimmed the tubing from reservoir to the pump using the connection kit being  careful to make sure there was no debris or air in the tubing. The tubing  was trimmed to length and connected. Following this, we inflated our  prosthesis. It functioned well, it inflated to erect itself after  approximately 4 cycles of a pump. We then deflated the corporal cylinders  leaving a moderately flaccid phallus but providing some tamponade on the  interior of the corpora. We then closed the wound in two layers closing  Scarpa's fascia with running 2-0 Vicryl and then closing the skin with a  running 4-0 subcuticular Monocryl. Dressings were applied. Scrotal support  was applied. The Foley catheter was attached to the drainage  bag and taped to the patient's abdomen. He was then reversed from his anesthesia which he  tolerated without complications. Please note Dr. Javier Glazier was present  and participated in all aspects of this case.     ______________________________  Glade Nurse, MD      Claudette Laws, M.D.  Electronically Signed    MT/MEDQ  D:  12/03/2004  T:  12/03/2004  Job:  [id number]

## 2012-01-02 HISTORY — PX: PACEMAKER INSERTION: SHX728

## 2012-01-30 ENCOUNTER — Encounter (HOSPITAL_COMMUNITY): Payer: Self-pay | Admitting: Emergency Medicine

## 2012-01-30 ENCOUNTER — Emergency Department (HOSPITAL_COMMUNITY): Payer: Medicare Other

## 2012-01-30 ENCOUNTER — Inpatient Hospital Stay (HOSPITAL_COMMUNITY)
Admission: EM | Admit: 2012-01-30 | Discharge: 2012-02-02 | DRG: 242 | Disposition: A | Discharge status: Home Health | Payer: Medicare Other | Attending: Interventional Cardiology | Admitting: Interventional Cardiology

## 2012-01-30 DIAGNOSIS — F0390 Unspecified dementia without behavioral disturbance: Secondary | ICD-10-CM

## 2012-01-30 DIAGNOSIS — I5033 Acute on chronic diastolic (congestive) heart failure: Secondary | ICD-10-CM | POA: Diagnosis present

## 2012-01-30 DIAGNOSIS — IMO0001 Reserved for inherently not codable concepts without codable children: Secondary | ICD-10-CM | POA: Diagnosis present

## 2012-01-30 DIAGNOSIS — N189 Chronic kidney disease, unspecified: Secondary | ICD-10-CM

## 2012-01-30 DIAGNOSIS — IMO0002 Reserved for concepts with insufficient information to code with codable children: Secondary | ICD-10-CM | POA: Diagnosis present

## 2012-01-30 DIAGNOSIS — I5032 Chronic diastolic (congestive) heart failure: Secondary | ICD-10-CM

## 2012-01-30 DIAGNOSIS — Z79899 Other long term (current) drug therapy: Secondary | ICD-10-CM

## 2012-01-30 DIAGNOSIS — I442 Atrioventricular block, complete: Principal | ICD-10-CM | POA: Diagnosis present

## 2012-01-30 DIAGNOSIS — Z7982 Long term (current) use of aspirin: Secondary | ICD-10-CM

## 2012-01-30 DIAGNOSIS — Z794 Long term (current) use of insulin: Secondary | ICD-10-CM

## 2012-01-30 DIAGNOSIS — I498 Other specified cardiac arrhythmias: Secondary | ICD-10-CM | POA: Diagnosis present

## 2012-01-30 DIAGNOSIS — N179 Acute kidney failure, unspecified: Secondary | ICD-10-CM | POA: Diagnosis present

## 2012-01-30 DIAGNOSIS — I10 Essential (primary) hypertension: Secondary | ICD-10-CM

## 2012-01-30 DIAGNOSIS — I5031 Acute diastolic (congestive) heart failure: Secondary | ICD-10-CM

## 2012-01-30 DIAGNOSIS — E1165 Type 2 diabetes mellitus with hyperglycemia: Secondary | ICD-10-CM

## 2012-01-30 DIAGNOSIS — I129 Hypertensive chronic kidney disease with stage 1 through stage 4 chronic kidney disease, or unspecified chronic kidney disease: Secondary | ICD-10-CM | POA: Diagnosis present

## 2012-01-30 DIAGNOSIS — G609 Hereditary and idiopathic neuropathy, unspecified: Secondary | ICD-10-CM | POA: Diagnosis present

## 2012-01-30 HISTORY — DX: Unspecified dementia, unspecified severity, without behavioral disturbance, psychotic disturbance, mood disturbance, and anxiety: F03.90

## 2012-01-30 HISTORY — DX: Mental disorder, not otherwise specified: F99

## 2012-01-30 LAB — CREATININE, SERUM
GFR calc Af Amer: 18 mL/min — ABNORMAL LOW (ref >90)
GFR calc non Af Amer: 16 mL/min — ABNORMAL LOW (ref >90)

## 2012-01-30 LAB — CBC WITH DIFFERENTIAL/PLATELET
Basophils Absolute: 0 10*3/uL (ref 0.0–0.1)
Eosinophils Relative: 2 % (ref 0–5)
Lymphocytes Relative: 25 % (ref 12–46)
MCV: 93.4 fL (ref 78.0–100.0)
Neutro Abs: 5.4 10*3/uL (ref 1.7–7.7)
Neutrophils Relative %: 66 % (ref 43–77)
Platelets: 159 10*3/uL (ref 150–400)
RDW: 14.4 % (ref 11.5–15.5)
WBC: 8.3 10*3/uL (ref 4.0–10.5)

## 2012-01-30 LAB — GLUCOSE, CAPILLARY: Glucose-Capillary: 157 mg/dL — ABNORMAL HIGH (ref 70–99)

## 2012-01-30 LAB — MRSA PCR SCREENING: MRSA by PCR: NEGATIVE

## 2012-01-30 LAB — CBC
MCHC: 32.4 g/dL (ref 30.0–36.0)
Platelets: 134 10*3/uL — ABNORMAL LOW (ref 150–400)
RDW: 14.7 % (ref 11.5–15.5)
WBC: 8.2 10*3/uL (ref 4.0–10.5)

## 2012-01-30 LAB — PROTIME-INR: Prothrombin Time: 14.3 seconds (ref 11.6–15.2)

## 2012-01-30 LAB — TROPONIN I: Troponin I: <0.3 ng/mL (ref <0.30)

## 2012-01-30 LAB — BASIC METABOLIC PANEL
CO2: 20 mEq/L (ref 19–32)
Calcium: 9.2 mg/dL (ref 8.4–10.5)
Sodium: 139 mEq/L (ref 135–145)

## 2012-01-30 MED ORDER — DOPAMINE-DEXTROSE 3.2-5 MG/ML-% IV SOLN
INTRAVENOUS | Status: AC
  Administered 2012-01-30: 3 ug/kg/min via INTRAVENOUS
  Filled 2012-01-30: qty 250

## 2012-01-30 MED ORDER — SERTRALINE HCL 100 MG PO TABS
100.0000 mg | ORAL_TABLET | Freq: Every day | ORAL | Status: DC
  Administered 2012-01-30 – 2012-02-01 (×3): 100 mg via ORAL
  Filled 2012-01-30 (×4): qty 1

## 2012-01-30 MED ORDER — FINASTERIDE 5 MG PO TABS
5.0000 mg | ORAL_TABLET | Freq: Every day | ORAL | Status: DC
  Administered 2012-01-30 – 2012-02-01 (×3): 5 mg via ORAL
  Filled 2012-01-30 (×4): qty 1

## 2012-01-30 MED ORDER — MEMANTINE HCL 10 MG PO TABS
10.0000 mg | ORAL_TABLET | Freq: Every day | ORAL | Status: DC
  Administered 2012-01-30 – 2012-02-01 (×3): 10 mg via ORAL
  Filled 2012-01-30 (×4): qty 1

## 2012-01-30 MED ORDER — INSULIN ASPART 100 UNIT/ML ~~LOC~~ SOLN
0.0000 [IU] | Freq: Three times a day (TID) | SUBCUTANEOUS | Status: DC
  Administered 2012-01-31 – 2012-02-01 (×3): 3 [IU] via SUBCUTANEOUS

## 2012-01-30 MED ORDER — SODIUM CHLORIDE 0.9 % IV SOLN: 250.0000 mL | INTRAVENOUS | Status: DC | PRN

## 2012-01-30 MED ORDER — MIRTAZAPINE 7.5 MG PO TABS
7.5000 mg | ORAL_TABLET | Freq: Every day | ORAL | Status: DC
  Administered 2012-01-30 – 2012-02-01 (×3): 7.5 mg via ORAL
  Filled 2012-01-30 (×4): qty 1

## 2012-01-30 MED ORDER — SODIUM CHLORIDE 0.9 % IJ SOLN: 3.0000 mL | INTRAMUSCULAR | Status: DC | PRN

## 2012-01-30 MED ORDER — DOPAMINE-DEXTROSE 3.2-5 MG/ML-% IV SOLN
3.0000 ug/kg/min | Freq: Once | INTRAVENOUS | Status: AC
  Administered 2012-01-30: 3 ug/kg/min via INTRAVENOUS
  Filled 2012-01-30: qty 250

## 2012-01-30 MED ORDER — DONEPEZIL HCL 10 MG PO TABS
10.0000 mg | ORAL_TABLET | Freq: Every day | ORAL | Status: DC
  Administered 2012-01-30 – 2012-02-01 (×3): 10 mg via ORAL
  Filled 2012-01-30 (×4): qty 1

## 2012-01-30 MED ORDER — INSULIN ASPART 100 UNIT/ML ~~LOC~~ SOLN
0.0000 [IU] | Freq: Every day | SUBCUTANEOUS | Status: DC
  Administered 2012-01-30: 2 [IU] via SUBCUTANEOUS

## 2012-01-30 MED ORDER — SODIUM CHLORIDE 0.9 % IJ SOLN
3.0000 mL | Freq: Two times a day (BID) | INTRAMUSCULAR | Status: DC
  Administered 2012-01-31 – 2012-02-01 (×2): 3 mL via INTRAVENOUS

## 2012-01-30 MED ORDER — ASPIRIN EC 81 MG PO TBEC
81.0000 mg | DELAYED_RELEASE_TABLET | Freq: Every day | ORAL | Status: DC
  Administered 2012-01-30 – 2012-02-01 (×3): 81 mg via ORAL
  Filled 2012-01-30 (×4): qty 1

## 2012-01-30 MED ORDER — ATORVASTATIN CALCIUM 10 MG PO TABS
10.0000 mg | ORAL_TABLET | Freq: Every day | ORAL | Status: DC
  Administered 2012-01-30 – 2012-02-01 (×3): 10 mg via ORAL
  Filled 2012-01-30 (×4): qty 1

## 2012-01-30 MED ORDER — SODIUM CHLORIDE 0.9 % IJ SOLN
3.0000 mL | Freq: Two times a day (BID) | INTRAMUSCULAR | Status: DC
  Administered 2012-01-30: 3 mL via INTRAVENOUS

## 2012-01-30 MED ORDER — ENOXAPARIN SODIUM 30 MG/0.3ML ~~LOC~~ SOLN
30.0000 mg | SUBCUTANEOUS | Status: DC
  Administered 2012-01-30 – 2012-02-01 (×3): 30 mg via SUBCUTANEOUS
  Filled 2012-01-30 (×4): qty 0.3

## 2012-01-30 MED ORDER — SODIUM CHLORIDE 0.9 % IV SOLN
INTRAVENOUS | Status: DC
  Administered 2012-01-30 – 2012-01-31 (×2): via INTRAVENOUS

## 2012-01-30 MED ORDER — DOPAMINE-DEXTROSE 3.2-5 MG/ML-% IV SOLN
5.0000 ug/kg/min | INTRAVENOUS | Status: DC
  Administered 2012-01-30: 5 ug/kg/min via INTRAVENOUS
  Filled 2012-01-30: qty 250

## 2012-01-30 MED ORDER — INSULIN ASPART 100 UNIT/ML ~~LOC~~ SOLN: 12.0000 [IU] | Freq: Three times a day (TID) | SUBCUTANEOUS | Status: DC

## 2012-01-30 MED ORDER — INSULIN GLARGINE 100 UNIT/ML ~~LOC~~ SOLN
20.0000 [IU] | Freq: Every day | SUBCUTANEOUS | Status: DC
  Administered 2012-01-30 – 2012-01-31 (×2): 70 [IU] via SUBCUTANEOUS

## 2012-01-30 NOTE — ED Notes (Signed)
Pt's son, Diante Barley, can be reached at [phone], if pt gets transported to [facility] before he returns, went home to get pt's belongings

## 2012-01-30 NOTE — H&P (Signed)
Dakota White is a 78 y.o. male  Admit date: 01/30/2012 Referring Physician: [facility] department Primary Cardiologist::  Gwynneth Albright, M.D. Chief complaint / reason for admission: Recurrent syncope and third-degree heart block  HPI: The patient is 34 and suffers with dementia. He is a 6-8 week history of recurring weakness and fainting with seizure-like activity. He has been admitted to the [facility] and [facility] twice during this time frame but no abnormalities found. This morning he was basically unable to get out of bed. His gait was abnormal. His son fell the villous something wrong and decided to bring him to the emergency room here at [facility]. Over the weakness he has no complaints of chest pain, dyspnea, or other cardiac symptoms. There is no peripheral edema to   PMH:    Past Medical History  Diagnosis Date  . Diabetes mellitus without complication   . Hypertension   . Mental disorder   . Dementia     PSH:    Past Surgical History  Procedure Date  . Rotator cuff repair     ALLERGIES:   Actos and Ramipril  Prior to Admit Meds:   (Not in a hospital admission) Family HX:   No family history on file. Social HX:    History   Social History  . Marital Status: Married    Spouse Name: N/A    Number of Children: N/A  . Years of Education: N/A   Occupational History  . Retired Cytogeneticist    Social History Main Topics  . Smoking status: Never Smoker   . Smokeless tobacco: Never Used  . Alcohol Use: No  . Drug Use: No  . Sexually Active: No   Other Topics Concern  . Not on file   Social History Narrative   The patient is widowed     ROS: He denies chest pain, acute/new neurological symptoms, bleeding, melena, head trauma, prior myocardial infarction, heart failure, stroke, and lung disease.  Physical Exam: Blood pressure 152/36, pulse 26, temperature 97.3 F (36.3 C), temperature source Oral, resp. rate 16, weight 84.1 kg (185 lb  6.5 oz), SpO2 98.00%.    Patient is lying comfortably in the hospital gurney and nearly 0. There is no dyspnea. This is despite a heart rate of 30 beats per minute.  The patient's hands and feet are cold but not cyanotic.  A left carotid bruit is heard. No JVD is noted.  Cardiac exam is difficult to 2 external pacemaker pads. No obvious murmurs heard. No gallop is heard.  Lungs clear to auscultation and percussion.  Abdomen is nontender and soft. Bowel sounds are normal. No bruits.  Femoral pulses are 2+. Posterior tibial pulses are 2+. Is no peripheral edema.  Neurological exam reveals no focal motor or sensory deficits however the patient does have significant decrease in memory noted while attempting to obtain a history. Labs:   Lab Results  Component Value Date   WBC 8.3 01/30/2012   HGB 12.8* 01/30/2012   HCT 38.2* 01/30/2012   MCV 93.4 01/30/2012   PLT 159 01/30/2012    Lab 01/30/12 1345  NA 139  K 5.1  CL 104  CO2 20  BUN 41*  CREATININE 2.86*  CALCIUM 9.2  PROT --  BILITOT --  ALKPHOS --  ALT --  AST --  GLUCOSE 153*   Lab Results  Component Value Date   CKTOTAL 92 12/09/2009   TROPONINI <0.30 01/30/2012  Radiology:   Clinical Data: Elevated blood pressure  PORTABLE CHEST - 1 VIEW  Comparison: Chest radiograph 12/19/2008  Findings: Normal cardiac silhouette. There is mild increase in  interstitial edema pattern compared to prior. No focal  consolidation. No pneumothorax.  IMPRESSION:  Mild interstitial edema pattern.   EKG:  Marked bradycardia with narrow QRS complex, third-degree heart block, and a rate of 30 beats per minute  ASSESSMENT: 1. 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 Long-standing diabetes  3. Acute diastolic heart failure due to bradycardia/heart block.  4. Dementia  5. Acute on chronic kidney injury related to low cardiac output  Plan:  1. The mid to the coronary care unit at  [facility] and have external pacing capability. I have decided against a temporary pacemaker this time given the patient's stability.  2. Add low-dose dopamine to see if this will increase the junctional rhythm  3. N.p.o. after midnight for permanent pacemaker insertion on 01/30/2012  4. The patient desires to be a no CODE BLUE, I have discussed this with the patient and his son. This order will be temporarily rescinded until pacemaker therapy has been completed. Lesleigh Noe 01/30/2012 2:25 PM

## 2012-01-30 NOTE — ED Notes (Addendum)
Notified Dr. Katrinka Blazing of pt's HR remaining around 25-28 with the Dopamine at 32mcg/kg/min. No new orders are given at this time

## 2012-01-30 NOTE — ED Notes (Signed)
Called to give report to floor at [facility]. RN not available at this time, awaiting call back.

## 2012-01-30 NOTE — ED Notes (Signed)
Per pt's son, pt has had several "syncopial/stroke/fainting" spells over the past couple months and has been seen at the [place] but havent been able to figure out the cause. Pt presents today with HR 29-30bpm.  Son states that pt felt "bad" on Friday but by the evening felt better, then yesterday was doing pretty good, but this morning just hasnt "felt well" and son felt he needed to be evaluated.

## 2012-01-30 NOTE — ED Notes (Signed)
Bed:RESB<BR> Expected date:<BR> Expected time:<BR> Means of arrival:<BR> Comments:<BR>

## 2012-01-30 NOTE — ED Provider Notes (Signed)
History     CSN: [id number]  Arrival date & time 01/30/12  1239   First MD Initiated Contact with Patient 01/30/12 1322      Chief Complaint  Patient presents with  . Bradycardia    (Consider location/radiation/quality/duration/timing/severity/associated sxs/prior treatment) HPI Comments: Patient comes to the ER for evaluation of weakness and dizziness. He says that he has not been feeling well for quite some time. In the last 2 days, he has had increased shortness of breath, confusion and dizziness. The patient's son reports that he has been having episodes of syncope and possible seizure activity over the last several months. He has been admitted to the [place] twice for this and had multiple workups but the reason was never found. Patient denies any chest pain.   Past Medical History  Diagnosis Date  . Diabetes mellitus without complication   . Dementia   . Hypertension     Past Surgical History  Procedure Date  . Rotator cuff repair     No family history on file.  History  Substance Use Topics  . Smoking status: Never Smoker   . Smokeless tobacco: Never Used  . Alcohol Use: No      Review of Systems  Respiratory: Positive for shortness of breath.   Cardiovascular: Negative for chest pain.  Musculoskeletal: Positive for back pain.  Neurological: Positive for seizures and syncope.  Psychiatric/Behavioral: Positive for confusion.  All other systems reviewed and are negative.    Allergies  Actos and Ramipril  Home Medications   Current Outpatient Rx  Name  Route  Sig  Dispense  Refill  . AMLODIPINE BESYLATE 10 MG PO TABS   Oral   Take 10 mg by mouth at bedtime.         . ASPIRIN EC 81 MG PO TBEC   Oral   Take 81 mg by mouth at bedtime.         . DONEPEZIL HCL 10 MG PO TABS   Oral   Take 10 mg by mouth at bedtime.         Marland Kitchen FINASTERIDE 5 MG PO TABS   Oral   Take 5 mg by mouth at bedtime.         . INSULIN ASPART 100 UNIT/ML [place] SOLN  Subcutaneous   Inject 12-16 Units into the skin 3 (three) times daily before meals. Sliding scale if glucose is over 150         . INSULIN GLARGINE 100 UNIT/ML [place] SOLN   Subcutaneous   Inject 20-70 Units into the skin at bedtime. 20 units in the morning and  70 units in the evening         . MEMANTINE HCL 10 MG PO TABS   Oral   Take 10 mg by mouth at bedtime.         Marland Kitchen MIRTAZAPINE 15 MG PO TABS   Oral   Take 7.5 mg by mouth at bedtime.         Marland Kitchen ROSUVASTATIN CALCIUM 5 MG PO TABS   Oral   Take 5 mg by mouth at bedtime.         . SERTRALINE HCL 100 MG PO TABS   Oral   Take 100 mg by mouth at bedtime.           BP 152/36  Pulse 29  Temp 97.3 F (36.3 C) (Oral)  Resp 16  SpO2 98%  Physical Exam  Constitutional: He is oriented to person,  place, and time. He appears well-developed and well-nourished. No distress.  HENT:  Head: Normocephalic and atraumatic.  Right Ear: Hearing normal.  Nose: Nose normal.  Mouth/Throat: Oropharynx is clear and moist and mucous membranes are normal.  Eyes: Conjunctivae normal and EOM are normal. Pupils are equal, round, and reactive to light.  Neck: Normal range of motion. Neck supple.  Cardiovascular: Regular rhythm, S1 normal, S2 normal and normal heart sounds.  Bradycardia present.  Exam reveals no gallop and no friction rub.   No murmur heard. Pulmonary/Chest: Effort normal and breath sounds normal. No respiratory distress. He exhibits no tenderness.  Abdominal: Soft. Normal appearance and bowel sounds are normal. There is no hepatosplenomegaly. There is no tenderness. There is no rebound, no guarding, no tenderness at McBurney's point and negative Murphy's sign. No hernia.  Musculoskeletal: Normal range of motion.  Neurological: He is alert and oriented to person, place, and time. He has normal strength. No cranial nerve deficit or sensory deficit. Coordination normal. GCS eye subscore is 4. GCS verbal subscore is 5. GCS motor  subscore is 6.  Skin: Skin is warm, dry and intact. No rash noted. No cyanosis.  Psychiatric: He has a normal mood and affect. His speech is normal and behavior is normal. Thought content normal.    ED Course  Procedures (including critical care time)  Labs Reviewed  GLUCOSE, CAPILLARY - Abnormal; Notable for the following:    Glucose-Capillary 157 (*)     All other components within normal limits  CBC WITH DIFFERENTIAL - Abnormal; Notable for the following:    RBC 4.09 (*)     Hemoglobin 12.8 (*)     HCT 38.2 (*)     All other components within normal limits  BASIC METABOLIC PANEL - Abnormal; Notable for the following:    Glucose, Bld 153 (*)     BUN 41 (*)     Creatinine, Ser 2.86 (*)     GFR calc non Af Amer 19 (*)     GFR calc Af Amer 22 (*)     All other components within normal limits  TROPONIN I   Dg Chest Port 1 View  01/30/2012  *RADIOLOGY REPORT*  Clinical Data: Elevated blood pressure  PORTABLE CHEST - 1 VIEW  Comparison: Chest radiograph 12/19/2008  Findings: Normal cardiac silhouette.  There is mild increase in interstitial edema pattern compared to prior.  No focal consolidation.  No pneumothorax.  IMPRESSION: Mild interstitial edema pattern.   Original Report Authenticated By: Genevive Bi, M.D.      1. Complete heart block       MDM  Patient presents to the ER for evaluation of generalized weakness and dizziness. The patient's son tells me that the patient has been worked up several times at the [place] for syncopal episodes. The son indicates that the [place] was working him up for possible seizures. Arrival to the ER, however, history is evaluation revealed significant bradycardia. An EKG was performed and it was revealed as complete heart block. Patient has been slightly hypertensive throughout, remains stable. Cardiology, Dr. Katrinka Blazing, was consulted and has evaluated the patient in the ER. Patient to be admitted for definitive treatment of complete heart  block.        Gilda Crease, MD 01/30/12 [phone]

## 2012-01-31 ENCOUNTER — Encounter (HOSPITAL_COMMUNITY)
Admission: EM | Disposition: A | Discharge status: Home Health | Payer: Self-pay | Source: Home / Self Care | Attending: Interventional Cardiology

## 2012-01-31 DIAGNOSIS — I442 Atrioventricular block, complete: Secondary | ICD-10-CM

## 2012-01-31 DIAGNOSIS — N179 Acute kidney failure, unspecified: Secondary | ICD-10-CM | POA: Diagnosis present

## 2012-01-31 HISTORY — PX: PERMANENT PACEMAKER INSERTION: SHX5480

## 2012-01-31 LAB — GLUCOSE, CAPILLARY
Glucose-Capillary: 192 mg/dL — ABNORMAL HIGH (ref 70–99)
Glucose-Capillary: 106 mg/dL — ABNORMAL HIGH (ref 70–99)

## 2012-01-31 LAB — CBC
MCHC: 33.6 g/dL (ref 30.0–36.0)
Platelets: 122 10*3/uL — ABNORMAL LOW (ref 150–400)
RDW: 14.5 % (ref 11.5–15.5)

## 2012-01-31 LAB — HEMOGLOBIN A1C
Hgb A1c MFr Bld: 8.3 % — ABNORMAL HIGH (ref <5.7)
Mean Plasma Glucose: 192 mg/dL — ABNORMAL HIGH (ref <117)

## 2012-01-31 LAB — TROPONIN I: Troponin I: <0.3 ng/mL (ref <0.30)

## 2012-01-31 LAB — BASIC METABOLIC PANEL
BUN: 53 mg/dL — ABNORMAL HIGH (ref 6–23)
Creatinine, Ser: 3.79 mg/dL — ABNORMAL HIGH (ref 0.50–1.35)
GFR calc Af Amer: 16 mL/min — ABNORMAL LOW (ref >90)
GFR calc non Af Amer: 14 mL/min — ABNORMAL LOW (ref >90)

## 2012-01-31 SURGERY — PERMANENT PACEMAKER INSERTION
Anesthesia: LOCAL

## 2012-01-31 MED ORDER — CHLORHEXIDINE GLUCONATE 4 % EX LIQD
CUTANEOUS | Status: AC
  Filled 2012-01-31: qty 30

## 2012-01-31 MED ORDER — SODIUM CHLORIDE 0.9 % IJ SOLN: 3.0000 mL | Freq: Two times a day (BID) | INTRAMUSCULAR | Status: DC

## 2012-01-31 MED ORDER — FENTANYL CITRATE 0.05 MG/ML IJ SOLN
INTRAMUSCULAR | Status: AC
  Filled 2012-01-31: qty 2

## 2012-01-31 MED ORDER — SODIUM CHLORIDE 0.9 % IJ SOLN: 3.0000 mL | INTRAMUSCULAR | Status: DC | PRN

## 2012-01-31 MED ORDER — MIDAZOLAM HCL 5 MG/5ML IJ SOLN
INTRAMUSCULAR | Status: AC
  Filled 2012-01-31: qty 5

## 2012-01-31 MED ORDER — CEFAZOLIN SODIUM 1-5 GM-% IV SOLN
INTRAVENOUS | Status: AC
  Filled 2012-01-31: qty 100

## 2012-01-31 MED ORDER — SODIUM CHLORIDE 0.9 % IR SOLN
80.0000 mg | Status: DC
  Filled 2012-01-31: qty 2

## 2012-01-31 MED ORDER — CEFAZOLIN SODIUM-DEXTROSE 2-3 GM-% IV SOLR
2.0000 g | INTRAVENOUS | Status: DC
  Filled 2012-01-31: qty 50

## 2012-01-31 MED ORDER — CHLORHEXIDINE GLUCONATE 4 % EX LIQD
60.0000 mL | Freq: Once | CUTANEOUS | Status: AC
  Administered 2012-01-31: 4 via TOPICAL

## 2012-01-31 MED ORDER — HEPARIN (PORCINE) IN NACL 2-0.9 UNIT/ML-% IJ SOLN
INTRAMUSCULAR | Status: AC
  Filled 2012-01-31: qty 500

## 2012-01-31 MED ORDER — LIDOCAINE HCL (PF) 1 % IJ SOLN
INTRAMUSCULAR | Status: AC
  Filled 2012-01-31: qty 60

## 2012-01-31 MED ORDER — CEFAZOLIN SODIUM-DEXTROSE 2-3 GM-% IV SOLR
2.0000 g | Freq: Four times a day (QID) | INTRAVENOUS | Status: AC
  Administered 2012-01-31 – 2012-02-01 (×3): 2 g via INTRAVENOUS
  Filled 2012-01-31 (×3): qty 50

## 2012-01-31 MED ORDER — FUROSEMIDE 10 MG/ML IJ SOLN
40.0000 mg | Freq: Once | INTRAMUSCULAR | Status: AC
  Administered 2012-01-31: 40 mg via INTRAVENOUS
  Filled 2012-01-31: qty 4

## 2012-01-31 MED ORDER — SODIUM CHLORIDE 0.45 % IV SOLN: INTRAVENOUS | Status: DC

## 2012-01-31 MED ORDER — SODIUM CHLORIDE 0.9 % IV SOLN: 250.0000 mL | INTRAVENOUS | Status: DC

## 2012-01-31 MED ORDER — CHLORHEXIDINE GLUCONATE 4 % EX LIQD: 60.0000 mL | Freq: Once | CUTANEOUS | Status: DC

## 2012-01-31 NOTE — Progress Notes (Signed)
Orthopedic Tech Progress Note Patient Details:  Dakota White 1928/08/21 [id number] Patient is wearing left arm sling. No action from [employer]. Patient ID: Dakota White, male   DOB: 05-Sep-1928, 78 y.o.   MRN: [id number]   [name] 01/31/2012, 1:21 PM

## 2012-01-31 NOTE — Op Note (Signed)
DDD PPM insertion via the left cephalic vein without immediate complication. [id number].

## 2012-01-31 NOTE — Progress Notes (Signed)
  Echocardiogram 2D Echocardiogram has been performed.  Jorje Guild 01/31/2012, 8:53 AM

## 2012-01-31 NOTE — Progress Notes (Signed)
Patient Name: Dakota White Date of Encounter: 01/31/2012    SUBJECTIVE: No major problems overnight. He denies chest pain and dyspnea. Able to lay flat in bed.  TELEMETRY:  Third degree AV block with effective heart rate of approximately 30 beats per minute: Filed Vitals:   01/31/12 0500 01/31/12 0600 01/31/12 0630 01/31/12 0700  BP: 120/42  135/38 136/103  Pulse: 29 29 29 29   Temp:      TempSrc:      Resp: 17 20 15 18   Height:      Weight:      SpO2: 96% 95% 93% 95%    Intake/Output Summary (Last 24 hours) at 01/31/12 0748 Last data filed at 01/31/12 0700  Gross per 24 hour  Intake 1400.6 ml  Output     80 ml  Net 1320.6 ml    LABS: Basic Metabolic Panel:  Basename 01/31/12 0600 01/30/12 1800 01/30/12 1345  NA 137 -- 139  K 5.1 -- 5.1  CL 103 -- 104  CO2 18* -- 20  GLUCOSE 173* -- 153*  BUN 53* -- 41*  CREATININE 3.79* 3.40* --  CALCIUM 9.0 -- 9.2  MG -- -- --  PHOS -- -- --   CBC:  Basename 01/31/12 0600 01/30/12 1800 01/30/12 1345  WBC 8.4 8.2 --  NEUTROABS -- -- 5.4  HGB 11.4* 10.8* --  HCT 33.9* 33.3* --  MCV 93.4 94.9 --  PLT 122* 134* --   Cardiac Enzymes:  Basename 01/31/12 0600 01/30/12 1345  CKTOTAL -- --  CKMB -- --  CKMBINDEX -- --  TROPONINI <0.30 <0.30    Hemoglobin A1C:  Basename 01/30/12 1800  HGBA1C 8.3*   Radiology/Studies:   RADIOLOGY REPORT*  Clinical Data: Elevated blood pressure  PORTABLE CHEST - 1 VIEW  Comparison: Chest radiograph 12/19/2008  Findings: Normal cardiac silhouette. There is mild increase in  interstitial edema pattern compared to prior. No focal  consolidation. No pneumothorax.  IMPRESSION:  Mild interstitial edema pattern.  Original Report Authenticated By: Genevive Bi, M.D.  Physical Exam: Blood pressure 136/103, pulse 29, temperature 97.9 F (36.6 C), temperature source Oral, resp. rate 18, height 5\' 11"  (1.803 m), weight 81.8 kg (180 lb 5.4 oz), SpO2 95.00%. Weight change:    Lungs  clear anteriorly  Cardiac exam reveals significant bradycardia and one of 6 systolic murmur. No obvious gallop.  ASSESSMENT:  1. Third-degree heart block with Stokes-Adams attacks over the past 6 months. Assume that he has had intermittent third-degree heart block until this admission.  2. Heart failure, suspect acute on chronic diastolic, but echo will assess LV function. Heart patient improved with increased heart rate and diuresis.  3. Acute on chronic kidney failure. Suspect a major component of the kidney injury is related to low cardiac output from third-degree heart block.  Plan:  1. Plan diuresis after pacemaker insertion  2. Clear liquid breakfast and n.p.o. for DDD pacer insertion by Dr. Lewayne Bunting  Signed, Lesleigh Noe 01/31/2012, 7:48 AM

## 2012-01-31 NOTE — Care Management Note (Addendum)
    Page 1 of 2   02/03/2012     3:03:09 PM   CARE MANAGEMENT NOTE 02/03/2012  Patient:  NAJEH, CREDIT   Account Number:  [id number]  Date Initiated:  01/31/2012  Documentation initiated by:  Junius Creamer  Subjective/Objective Assessment:   adem w thrid degree av block, pacer on 12-30     Action/Plan:   lives w fam   Anticipated DC Date:  02/02/2012   Anticipated DC Plan:  HOME W HOME HEALTH SERVICES      DC Planning Services  CM consult  Medication Assistance      [facility] Choice  HOME HEALTH  Resumption Of Svcs/PTA Provider   Choice offered to / List presented to:  C-1 Patient        HH arranged  HH-1 RN  HH-2 PT  HH-3 OT  HH-4 NURSE'S AIDE      HH agency  [facility]   Status of service:  Completed, signed off Medicare Important Message given?   (If response is "NO", the following Medicare IM given date fields will be blank) Date Medicare IM given:   Date Additional Medicare IM given:    Discharge Disposition:  HOME W HOME HEALTH SERVICES  Per UR Regulation:  Reviewed for med. necessity/level of care/duration of stay  If discussed at Long Length of Stay Meetings, dates discussed:    Comments:  02/03/12 Daune Divirgilio,RN,BSN [phone] PT ACTIVE WITH [facility] HEALTH SERVICES PRIOR TO ADMISSION.  [facility] AWARE OF PT DC ON 02/02/12 AND IS TO RESUME SERVICES AS PRIOR TO ADMISSION--VERIFIED WITH DEBBIE TAYLOR FROM [facility].  12/30 1255p debbie dowell rn,bsn [phone]

## 2012-01-31 NOTE — Progress Notes (Signed)
EP physician and NP spoke with family regarding procedure. All questions answered at this time. Pt states understanding. Daughter signed consent due to patients short term memory impairment. Hibclens complete. Leads and linen changed. EKG leads cleaned prior to reapplication to patient.

## 2012-01-31 NOTE — Progress Notes (Signed)
Inpatient Diabetes Program Recommendations  AACE/ADA: New Consensus Statement on Inpatient Glycemic Control (2013)  Target Ranges:  Prepandial:   less than 140 mg/dL      Peak postprandial:   less than 180 mg/dL (1-2 hours)      Critically ill patients:  140 - 180 mg/dL   Inpatient Diabetes Program Recommendations HgbA1C: =8.3 Cr=3.79 Pharmacy communication sent this morning to clarify Lantus range order.   Will follow. Thank you  Dakota White [facility] Inpatient Diabetes Coordinator [phone]

## 2012-01-31 NOTE — Consult Note (Signed)
ELECTROPHYSIOLOGY CONSULT NOTE  Patient ID: Dakota White MRN: [id number], DOB/AGE: [birth year]   Admit date: 01/30/2012 Date of Consult: 01/31/2012  Primary Physician: [facility], [place], [place] Primary Cardiologist: Verdis Prime, MD Reason for Consultation: Complete heart block  History of Present Illness Dakota White is a 78 year old man with HTN, DM and dementia who was admitted with recurrent weakness, dizziness and syncope x 6-8 weeks. On admission he was found to be significantly bradycardic with a heart rate in the 20s. His 12-lead ECG and telemetry show complete heart block with ventricular escape at 28 bpm. He is not taking any AV nodal blockers. There have been no reversible causes identified. He denies CP or SOB. His daughter who is his healthcare POA is at the bedside and assists with history questions.   Past Medical History Past Medical History  Diagnosis Date  . Diabetes mellitus without complication   . Hypertension   . Mental disorder   . Dementia     Past Surgical History Past Surgical History  Procedure Date  . Rotator cuff repair      Allergies/Intolerances Allergies  Allergen Reactions  . Actos (Pioglitazone)   . Ramipril     Inpatient Medications    . aspirin EC  81 mg Oral QHS  . atorvastatin  10 mg Oral q1800  . donepezil  10 mg Oral QHS  . enoxaparin (LOVENOX) injection  30 mg Subcutaneous Q24H  . finasteride  5 mg Oral QHS  . furosemide  40 mg Intravenous Once  . insulin aspart  0-15 Units Subcutaneous TID WC  . insulin aspart  0-5 Units Subcutaneous QHS  . insulin glargine  20-70 Units Subcutaneous QHS  . memantine  10 mg Oral QHS  . mirtazapine  7.5 mg Oral QHS  . sertraline  100 mg Oral QHS  . sodium chloride  3 mL Intravenous Q12H      . sodium chloride 75 mL/hr at 01/31/12 0135  . DOPamine 5 mcg/kg/min (01/30/12 1638)    Family History Positive for CAD   Social History Social History  . Marital Status: Widowed    Occupational History  . Retired Cytogeneticist    Social History Main Topics  . Smoking status: Never Smoker   . Smokeless tobacco: Never Used  . Alcohol Use: No  . Drug Use: No   Social History Narrative   The patient is widowed    Review of Systems General: No chills, fever, night sweats or weight changes  Cardiovascular: No chest pain, dyspnea on exertion, edema, orthopnea, palpitations, paroxysmal nocturnal dyspnea Dermatological: No rash, lesions or masses Respiratory: No cough, dyspnea Urologic: No hematuria, dysuria Abdominal: No nausea, vomiting, diarrhea, bright red blood per rectum, melena, or hematemesis Neurologic: +weakness +LOC  No visual changes All other systems reviewed and are otherwise negative except as noted above.  Physical Exam Blood pressure 136/103, pulse 29, temperature 97.9 F (36.6 C), temperature source Oral, resp. rate 18, height 5\' 11"  (1.803 m), weight 180 lb 5.4 oz (81.8 kg), SpO2 95.00%.  General: Well developed, elderly 78 year old male in no acute distress. HEENT: Normocephalic, atraumatic. EOMs intact. Sclera nonicteric. Oropharynx clear.  Neck: Supple without bruits. No JVD. Lungs: Respirations regular and unlabored, CTA bilaterally. No wheezes, rales or rhonchi. Heart: Bradycardic. S1, S2 present. No murmurs, rub, S3 or S4. Abdomen: Soft, non-tender, non-distended. BS present x 4 quadrants. No hepatosplenomegaly.  Extremities: No clubbing, cyanosis or edema. DP/PT/Radials 2+ and equal bilaterally. Psych: Normal affect.  Neuro: Alert. Moves all extremities spontaneously. Musculoskeletal: No kyphosis. Skin: Intact. Warm and dry. No rashes or petechiae in exposed areas.   Labs  Basename 01/31/12 0600 01/30/12 1345  CKTOTAL -- --  CKMB -- --  TROPONINI <0.30 <0.30   Lab Results  Component Value Date   WBC 8.4 01/31/2012   HGB 11.4* 01/31/2012   HCT 33.9* 01/31/2012   MCV 93.4 01/31/2012   PLT 122* 01/31/2012    Lab 01/31/12 0600  NA  137  K 5.1  CL 103  CO2 18*  BUN 53*  CREATININE 3.79*  CALCIUM 9.0  PROT --  BILITOT --  ALKPHOS --  ALT --  AST --  GLUCOSE 173*    Basename 01/30/12 1800  TSH 2.933  T4TOTAL --  T3FREE --  THYROIDAB --    Basename 01/30/12 1800  INR 1.13    Radiology/Studies Dg Chest Port 1 View  01/30/2012  *RADIOLOGY REPORT*  Clinical Data: Elevated blood pressure  PORTABLE CHEST - 1 VIEW  Comparison: Chest radiograph 12/19/2008  Findings: Normal cardiac silhouette.  There is mild increase in interstitial edema pattern compared to prior.  No focal consolidation.  No pneumothorax.  IMPRESSION: Mild interstitial edema pattern.   Original Report Authenticated By: Genevive Bi, M.D.     Echocardiogram done but report is pending this AM  12-lead ECG on admission shows complete heart block at 28 bpm  Telemetry shows complete heart block with ventricular escape at 28 bpm   Assessment and Plan 1. Complete heart block 2. Dementia 3. HTN 4. DM 5.Renal insufficiency,question acute on chronic vs acute vs chronic renal failure, possibly due to low output state. Dakota White has complete heart block. He will need PPM. Risks, benefits and alternatives to PPM implantation were discussed in detail with Dakota White and his daughter today. These risks include, but are not limited to, bleeding, infection, pneumothorax, perforation, tamponade, vascular damage, renal failure, MI, lead dislodgement, stroke and death. Dakota White and his daughter expressed verbal understanding and agree to proceed.   Signed, EDMISTEN, Nehemiah Settle, PA-C 01/31/2012, 8:16 AM  EP Attending  Patient seen and independently examined. I have reviewed and revised as needed the note above. I agree with the history, physical exam, assessment and plan as outlined. He has complete heart block and Stokes-Adams syncope and will undergo PPM. The risks/benefits/goals/expectations of the procedure have been discussed with the patient and he  wishes to proceed.  Leonia Reeves.D.

## 2012-02-01 ENCOUNTER — Inpatient Hospital Stay (HOSPITAL_COMMUNITY): Payer: Medicare Other

## 2012-02-01 DIAGNOSIS — E1129 Type 2 diabetes mellitus with other diabetic kidney complication: Secondary | ICD-10-CM | POA: Diagnosis present

## 2012-02-01 LAB — URINALYSIS, ROUTINE W REFLEX MICROSCOPIC
Bilirubin Urine: NEGATIVE
Glucose, UA: NEGATIVE mg/dL
Ketones, ur: NEGATIVE mg/dL
Leukocytes, UA: NEGATIVE
Nitrite: NEGATIVE
Protein, ur: NEGATIVE mg/dL
Specific Gravity, Urine: 1.013 (ref 1.005–1.030)
Urobilinogen, UA: 0.2 mg/dL (ref 0.0–1.0)
pH: 6.5 (ref 5.0–8.0)

## 2012-02-01 LAB — GLUCOSE, CAPILLARY
Glucose-Capillary: 53 mg/dL — ABNORMAL LOW (ref 70–99)
Glucose-Capillary: 60 mg/dL — ABNORMAL LOW (ref 70–99)
Glucose-Capillary: 199 mg/dL — ABNORMAL HIGH (ref 70–99)
Glucose-Capillary: 173 mg/dL — ABNORMAL HIGH (ref 70–99)

## 2012-02-01 MED ORDER — INSULIN GLARGINE 100 UNIT/ML ~~LOC~~ SOLN
20.0000 [IU] | Freq: Every day | SUBCUTANEOUS | Status: DC
Start: 2012-02-01 — End: 2012-02-01

## 2012-02-01 MED ORDER — INSULIN GLARGINE 100 UNIT/ML ~~LOC~~ SOLN: 70.0000 [IU] | Freq: Every day | SUBCUTANEOUS | Status: DC

## 2012-02-01 MED ORDER — INSULIN GLARGINE 100 UNIT/ML ~~LOC~~ SOLN
60.0000 [IU] | Freq: Every day | SUBCUTANEOUS | Status: DC
  Administered 2012-02-01: 60 [IU] via SUBCUTANEOUS

## 2012-02-01 NOTE — Op Note (Signed)
Dakota [name], [name]               ACCOUNT NO.:  [id number]  MEDICAL RECORD NO.:  [id number]  LOCATION:  2901                         FACILITY:  [facility]  PHYSICIAN:  Doylene Canning. Ladona Ridgel, MD    DATE OF BIRTH:  1928-04-29  DATE OF PROCEDURE:  01/31/2012 DATE OF DISCHARGE:                              OPERATIVE REPORT   PROCEDURE PERFORMED:  Insertion of a dual-chamber pacemaker.  INDICATION:  Complete heart block with syncope.  INTRODUCTION:  The patient is an 78 year old male with recurrent syncopal episodes, which were initially undiagnosed.  He was subsequently found to have complete heart block and weakness in the setting of syncope and was admitted to the hospital.  He is now referred for insertion of a dual-chamber pacemaker.  PROCEDURE:  After informed consent was obtained, the patient was taken to the diagnostic EP lab in a fasting state.  After usual preparation draping, intravenous fentanyl and midazolam was given for sedation.  30 mL of lidocaine was infiltrated into the left infraclavicular region.  A 6-cm incision was carried out over this region and electrocautery was utilized to dissect down to the fascial plane.  Initial attempts to puncture the left subclavian vein were unsuccessful.  At this point, the incision was expanded slightly and electrocautery was utilized along with blunt dissection to dissect down to the cephalic vein.  As I was dissecting the cephalic vein out, the patient developed asystole.  He had no escape rhythm.  Transcutaneous pacing was then carried out allowing the patient to awaken and regain a palpable pulse.  At this point, attempts to cannulate the cephalic vein were unsuccessful, although it has been isolated.  The mechanical motion of the subcutaneous pacemaker made his chest move up and down in a way that made unsuitable to try to cannulate the cephalic vein further.  At this point, the left subclavian vein was punctured and a St. Jude 58  cm active fixation pacing lead was advanced into the right ventricle.  At its placement, pacing was confirmed.  The lead was actively fixed and the transcutaneous pacemaker was discontinued.  At this point, the lead was secured to the subpectoral fascia while pacing was commenced.  The lead was inadvertently punctured with a silk suture, and the patient again developed asystole.  Unipolar pacing was then carried out resulting in the pacing gaining a pulse back.  At this point, the cephalic vein was cannulated and a new St. Jude 2088 T 58 cm lead was advanced into the right ventricle and the St. Jude model 2088 T 52 cm pacing lead advanced to the right atrium.  The ventricular lead serial number was [id number] and the atrial lead serial number was [id number]. The leads were placed in the right ventricle and the right atrium in the usual manner.  The R-waves (paced) were greater than 30 mV and the P- waves were 3 mV.  The impedance was 980 ohms in the ventricle and 430 in the atrium.  The threshold was less than a V at 0.5 milliseconds in both right ventricle and right atrium.  With these satisfactory parameters, the atrial and ventricular leads were secured to the subpectoral fascia with  a figure-of-eight silk suture.  The sewing sleeve was secured with silk suture.  Electrocautery was then utilized to assure hemostasis. The St. Jude Accent DR RF dual-chamber pacemaker serial number [id number] was connected to the atrial and RV lead.  At this point, the damaged initial RV lead was removed and electrocautery was utilized to make a subcutaneous pocket.  The generator was secured to the fascial plane with silk suture.  The pocket was again irrigated with antibiotic irrigation and the incision was closed with 2-0 and 3-0 Vicryl.  Benzoin and Steri-Strips were painted on the skin and pressure dressing was applied.  The patient was returned to his room in satisfactory condition.  COMPLICATIONS:   There were no immediate procedure complications.  The patient did develop asystole spontaneously, which was treated with transcutaneous pacing.     Doylene Canning. Ladona Ridgel, MD     GWT/MEDQ  D:  01/31/2012  T:  01/31/2012  Job:  [id number]  cc:   Lyn Records, M.D.

## 2012-02-01 NOTE — Progress Notes (Signed)
   ELECTROPHYSIOLOGY ROUNDING NOTE    Patient Name: Dakota [name] Date of Encounter: 02-01-2012    SUBJECTIVE:Patient feels well.  No chest pain or shortness of breath.  Minimal incisional soreness.  S/p dual chamber pacemaker yesterday for symptomatic complete heart block.   TELEMETRY: Reviewed telemetry pt in sinus rhythm with ventricular pacing Filed Vitals:   02/01/12 0400 02/01/12 0500 02/01/12 0600 02/01/12 0624  BP: 149/47 142/50 149/59 158/51  Pulse: 74 72 81 81  Temp:      TempSrc:      Resp: 21 29 21 22   Height:      Weight:    179 lb 14.3 oz (81.6 kg)  SpO2: 91% 93% 96% 95%    Intake/Output Summary (Last 24 hours) at 02/01/12 0755 Last data filed at 02/01/12 0600  Gross per 24 hour  Intake  554.8 ml  Output   2050 ml  Net -1495.2 ml    LABS: Basic Metabolic Panel:  Basename 01/31/12 0600 01/30/12 1800 01/30/12 1345  NA 137 -- 139  K 5.1 -- 5.1  CL 103 -- 104  CO2 18* -- 20  GLUCOSE 173* -- 153*  BUN 53* -- 41*  CREATININE 3.79* 3.40* --  CALCIUM 9.0 -- 9.2  MG -- -- --  PHOS -- -- --   CBC:  Basename 01/31/12 0600 01/30/12 1800 01/30/12 1345  WBC 8.4 8.2 --  NEUTROABS -- -- 5.4  HGB 11.4* 10.8* --  HCT 33.9* 33.3* --  MCV 93.4 94.9 --  PLT 122* 134* --   Cardiac Enzymes:  Basename 01/31/12 0600 01/30/12 1345  CKTOTAL -- --  CKMB -- --  CKMBINDEX -- --  TROPONINI <0.30 <0.30   Hemoglobin A1C:  Basename 01/30/12 1800  HGBA1C 8.3*   Thyroid Function Tests:  Basename 01/30/12 1800  TSH 2.933  T4TOTAL --  T3FREE --  THYROIDAB --    Radiology/Studies:  Dg Chest Port 1 View 01/30/2012  *RADIOLOGY REPORT*  Clinical Data: Elevated blood pressure  PORTABLE CHEST - 1 VIEW  Comparison: Chest radiograph 12/19/2008  Findings: Normal cardiac silhouette.  There is mild increase in interstitial edema pattern compared to prior.  No focal consolidation.  No pneumothorax.  IMPRESSION: Mild interstitial edema pattern.   Original Report  Authenticated By: Genevive Bi, M.D.    Final result pending, leads in stable position.  PHYSICAL EXAM Left chest without hematoma or ecchymosis.   DEVICE INTERROGATION: Device interrogated by industry.  Lead values including impedence, sensing, threshold within normal values.    Wound care, arm mobility, restrictions reviewed with patient.  Dr Katrinka Blazing to do wound check appt in 1 week.  We will see as needed.   EP Attending  Patient seen and examined. Agree with above exam, assessment and plan.  Leonia Reeves.D.

## 2012-02-01 NOTE — Progress Notes (Signed)
Inpatient Diabetes Program Recommendations  AACE/ADA: New Consensus Statement on Inpatient Glycemic Control (2013)  Target Ranges:  Prepandial:   less than 140 mg/dL      Peak postprandial:   less than 180 mg/dL (1-2 hours)      Critically ill patients:  140 - 180 mg/dL  Inpatient Diabetes Program Recommendations Insulin - Basal: Decrease Lantus to 55 units at HS  HgbA1C: =8.3 HYPOglycemia this morning. CBG=55,60 Thank you  Piedad Climes [facility] Inpatient Diabetes Coordinator [phone]

## 2012-02-01 NOTE — Progress Notes (Signed)
Patient Name: Dakota [name] Date of Encounter: 02/01/2012    SUBJECTIVE: The patient is accompanied this morning by his daughter. The pacemaker was eventfully placed yesterday by Dr. Ladona Ridgel. He has done well overnight. He denies chest pain and dyspnea.  TELEMETRY:  Appropriate AV sequential paced: Filed Vitals:   02/01/12 0900 02/01/12 1000 02/01/12 1100 02/01/12 1200  BP: 139/77 128/55 147/57 138/52  Pulse: 80 75 75 78  Temp:   98.5 F (36.9 C)   TempSrc:      Resp: 23 16 20 19   Height:      Weight:      SpO2: 93% 93% 95% 94%    Intake/Output Summary (Last 24 hours) at 02/01/12 1240 Last data filed at 02/01/12 1200  Gross per 24 hour  Intake    509 ml  Output   2450 ml  Net  -1941 ml    LABS: Basic Metabolic Panel:  Basename 01/31/12 0600 01/30/12 1800 01/30/12 1345  NA 137 -- 139  K 5.1 -- 5.1  CL 103 -- 104  CO2 18* -- 20  GLUCOSE 173* -- 153*  BUN 53* -- 41*  CREATININE 3.79* 3.40* --  CALCIUM 9.0 -- 9.2  MG -- -- --  PHOS -- -- --   CBC:  Basename 01/31/12 0600 01/30/12 1800 01/30/12 1345  WBC 8.4 8.2 --  NEUTROABS -- -- 5.4  HGB 11.4* 10.8* --  HCT 33.9* 33.3* --  MCV 93.4 94.9 --  PLT 122* 134* --   Cardiac Enzymes:  Basename 01/31/12 0600 01/30/12 1345  CKTOTAL -- --  CKMB -- --  CKMBINDEX -- --  TROPONINI <0.30 <0.30   Hemoglobin A1C:  Basename 01/30/12 1800  HGBA1C 8.3*   Radiology/Studies:  Clinical Data: Status post pacemaker placement.  CHEST - 2 VIEW  Comparison: Chest x-ray 01/30/2012.  Findings: Interval placement of a left sided pacemaker device with  leads projecting over the expected location of the right atrium and  right ventricular apex. No associated pneumothorax. Linear  opacity in the left base is most compatible with an area of  subsegmental atelectasis. No consolidative airspace disease.  Trace bilateral pleural effusions. Pulmonary venous congestion  without frank pulmonary edema. Heart size is within normal  limits.  Mediastinal contours are unremarkable. Atherosclerosis in the  thoracic aorta.  IMPRESSION:  1. Pacemaker in place, as above, without acute complicating  features.  2. Trace bilateral pleural effusions.  3. Subsegmental atelectasis in the left lower lobe.  4. Atherosclerosis.  Original Report Authenticated By: Trudie Reed, M.D  ECHOCARDIOGRAM: 01/31/2012 ------------------------------------------------------------ LV EF: 60% - 65%  ------------------------------------------------------------ History: PMH: Third degree heart block. Risk factors: Peripheral neuropathy. Asthma. Chronic bronchitis. Hypertension. Diabetes mellitus.  ------------------------------------------------------------ Study Conclusions  - Left ventricle: The cavity size was normal. There was mild concentric hypertrophy. Systolic function was normal. The estimated ejection fraction was in the range of 60% to 65%. Although no diagnostic regional wall motion abnormality was identified, this possibility cannot be completely excluded on the basis of this study. - Mitral valve: Calcified annulus. - Left atrium: The atrium was mildly dilated. Impressions:  - Severe bradycardia noted.   Physical Exam: Blood pressure 138/52, pulse 78, temperature 98.5 F (36.9 C), temperature source Oral, resp. rate 19, height 5\' 11"  (1.803 m), weight 81.6 kg (179 lb 14.3 oz), SpO2 94.00%. Weight change: -2.5 kg (-5 lb 8.2 oz)   No pericardial rub is heard.  Trace rales are heard at the bases. Breath sounds are diminished bilaterally  The left subclavicular pacer pocket is unremarkable. No hematoma or bleeding  No edema.  Neurologically the patient is alert and awake. He appears at times to be confused.  ASSESSMENT:  1. Third-degree heart block treated with DDD pacemaker, which is now functioning normally.  2.  Acute on chronic diastolic heart failure secondary to severe bradycardia, hopefully the acute  component will improve with pacing.  3. Acute on chronic kidney injury, at least some of which I believe is secondary to low cardiac output.  4. Diabetes, not well:   Plan:  1. Diuresis  2. Ambulate as tolerated  3. Follow renal function  4. Consider discharge home when stable. This may take another one to 3 days depending upon his rate of improvement  5. Regulate diabetes  Signed, Lesleigh Noe 02/01/2012, 12:40 PM

## 2012-02-02 LAB — BASIC METABOLIC PANEL
CO2: 25 mEq/L (ref 19–32)
Calcium: 9.4 mg/dL (ref 8.4–10.5)
Creatinine, Ser: 1.85 mg/dL — ABNORMAL HIGH (ref 0.50–1.35)
GFR calc Af Amer: 38 mL/min — ABNORMAL LOW (ref >90)

## 2012-02-02 LAB — CBC
MCV: 93 fL (ref 78.0–100.0)
Platelets: 127 10*3/uL — ABNORMAL LOW (ref 150–400)
RDW: 14.1 % (ref 11.5–15.5)
WBC: 5 10*3/uL (ref 4.0–10.5)

## 2012-02-02 LAB — GLUCOSE, CAPILLARY: Glucose-Capillary: 207 mg/dL — ABNORMAL HIGH (ref 70–99)

## 2012-02-02 MED ORDER — INSULIN GLARGINE 100 UNIT/ML ~~LOC~~ SOLN: 60.0000 [IU] | Freq: Every day | SUBCUTANEOUS | Status: DC

## 2012-02-02 MED ORDER — YOU HAVE A PACEMAKER BOOK
Freq: Once | Status: DC
  Filled 2012-02-02: qty 1

## 2012-02-02 NOTE — Progress Notes (Signed)
02/02/2012 5:05 PM Nursing note Discharge avs form, medications already taken today and those due this evening given and explained to patient and daughter. Follow up appointments, when to call MD and incision site care reviewed. Gauze dressing removed from pacer site and betadine applied to steri strips site per protocol.  Activity restrictions sheet reviewed. D/c iv line. D/c tele. D/c home per orders. Pt. Had RW and HHRN, PT, PT and Aide set up with [facility] at time of discharge.

## 2012-02-02 NOTE — Progress Notes (Signed)
02/02/2012 3:12 PM Nursing notes  Pt. And daughter viewed educational video #118 on pacemakers as well as received Life with pacemaker booklet. Questions and concerns addressed.  Shalva Rozycki, Blanchard Kelch

## 2012-02-02 NOTE — Discharge Summary (Signed)
Patient ID: Dakota White MRN: [id number] DOB/AGE: [birth year] 79 y.o.  Admit date: 01/30/2012 Discharge date: 02/02/2012  Patient Active Problem List  Diagnosis  . PERIPHERAL NEUROPATHY  . HYPERTENSION  . ALLERGIC RHINITIS  . BRONCHITIS, CHRONIC  . ASTHMA  . Third degree heart block  . Acute diastolic heart failure  . Dementia  . Acute on chronic kidney failure  . DM (diabetes mellitus), type 2, uncontrolled    Primary Discharge Diagnosis: Third-degree heart block with recurrent Stokes-Adams attacks  Secondary Discharge Diagnosis: 1. Dementia   2. Diabetes mellitus, poorly controlled  3. Acute on chronic kidney injury secondary to poor cardiac output  4. Acute diastolic heart failure secondary to bradycardia  6. Hypertension  Significant Diagnostic Studies:  1. 2-D Doppler echocardiogram  2. DDD pacemaker implantation 01/31/2012, Carver Fila  Consults: Electrophysiology service, Dr. Nena Jordan Course:  This 79 year old gentleman with a history of dementia was brought to the [facility] long emergency room by his son because of fatigue and recurring episodes of loss of consciousness associated with twitching. He been seen at the [facility] on a least 2 prior occasions over the past 6-8 weeks with these episodes but no abnormalities were identified. In the emergency room the [facility] long he was then complete heart block with a ventricular rate of 29 beats per minute.  He was admitted to the hospital and the next morning underwent DDD pacemaker implantation by Dr. Ladona Ridgel.  Of note is significant elevation in creatinine of 3.8 on admission that decreased to 1.8 by the time of discharge due to improved cardiac output. The patient's mobility appeared to be increased. He also notes that his breathing had improved by the morning of discharge.   Discharge Exam: Blood pressure 155/71, pulse 74, temperature 98.1 F (36.7 C), temperature source Oral, resp. rate  18, height 5\' 11"  (1.803 m), weight 81.6 kg (179 lb 14.3 oz), SpO2 97.00%.   Clear lung fields.  No rub or murmur.  The neurological exam reveals no focal abnormality. Somewhat bland affect. Memory deficit is noticeable .    Labs:   Lab Results  Component Value Date   WBC 5.0 02/02/2012   HGB 11.2* 02/02/2012   HCT 33.2* 02/02/2012   MCV 93.0 02/02/2012   PLT 127* 02/02/2012    Lab 02/02/12 0550  NA 142  K 4.9  CL 107  CO2 25  BUN 34*  CREATININE 1.85*  CALCIUM 9.4  PROT --  BILITOT --  ALKPHOS --  ALT --  AST --  GLUCOSE 97   Lab Results  Component Value Date   CKTOTAL 92 12/09/2009   TROPONINI <0.30 01/31/2012      Radiology: CHEST - 2 VIEW  Comparison: Chest x-ray 01/30/2012.  Findings: Interval placement of a left sided pacemaker device with  leads projecting over the expected location of the right atrium and  right ventricular apex. No associated pneumothorax. Linear  opacity in the left base is most compatible with an area of  subsegmental atelectasis. No consolidative airspace disease.  Trace bilateral pleural effusions. Pulmonary venous congestion  without frank pulmonary edema. Heart size is within normal limits.  Mediastinal contours are unremarkable. Atherosclerosis in the  thoracic aorta.  IMPRESSION:  1. Pacemaker in place, as above, without acute complicating  features.  2. Trace bilateral pleural effusions.  3. Subsegmental atelectasis in the left lower lobe.  4. Atherosclerosis.  Original Report Authenticated By: Trudie Reed, M.D.   EKG: Telemetry  after pacemaker insertion revealed AV sequential pacing  ECHOCARDIOGRAM: 01/31/2012 ------------------------------------------------------------ LV EF: 60% - 65%  ------------------------------------------------------------ History: PMH: Third degree heart block. Risk factors: Peripheral neuropathy. Asthma. Chronic bronchitis. Hypertension. Diabetes  mellitus.  ------------------------------------------------------------ Study Conclusions  - Left ventricle: The cavity size was normal. There was mild concentric hypertrophy. Systolic function was normal. The estimated ejection fraction was in the range of 60% to 65%. Although no diagnostic regional wall motion abnormality was identified, this possibility cannot be completely excluded on the basis of this study. - Mitral valve: Calcified annulus. - Left atrium: The atrium was mildly dilated. Impressions:  - Severe bradycardia noted.   FOLLOW UP PLANS AND APPOINTMENTS    Medication List     As of 02/02/2012 12:13 PM    TAKE these medications         amLODipine 10 MG tablet   Commonly known as: NORVASC   Take 10 mg by mouth at bedtime.      aspirin EC 81 MG tablet   Take 81 mg by mouth at bedtime.      donepezil 10 MG tablet   Commonly known as: ARICEPT   Take 10 mg by mouth at bedtime.      finasteride 5 MG tablet   Commonly known as: PROSCAR   Take 5 mg by mouth at bedtime.      insulin aspart 100 UNIT/ML injection   Commonly known as: novoLOG   Inject 12-16 Units into the skin 3 (three) times daily before meals. Sliding scale if glucose is over 150      insulin glargine 100 UNIT/ML injection   Commonly known as: LANTUS   Inject 60 Units into the skin at bedtime.      memantine 10 MG tablet   Commonly known as: NAMENDA   Take 10 mg by mouth at bedtime.      mirtazapine 15 MG tablet   Commonly known as: REMERON   Take 7.5 mg by mouth at bedtime.      rosuvastatin 5 MG tablet   Commonly known as: CRESTOR   Take 5 mg by mouth at bedtime.      sertraline 100 MG tablet   Commonly known as: ZOLOFT   Take 100 mg by mouth at bedtime.           Follow-up Information    Follow up with Lesleigh Noe, MD. (Office will call with appointment to see the doctor and enroll in the pacer clinic)    Contact information:   [address] [place] [address] [phone]          BRING ALL MEDICATIONS WITH YOU TO FOLLOW UP APPOINTMENTS  Time spent with patient to include physician time: 40 minutes . Additional time was required to arrange home health care, coordinated care, and arrange followup  Signed: Lesleigh Noe 02/02/2012, 12:13 PM

## 2012-10-18 ENCOUNTER — Encounter: Payer: Self-pay | Admitting: *Deleted

## 2012-11-13 ENCOUNTER — Encounter: Payer: Medicare Other | Admitting: *Deleted

## 2012-11-15 ENCOUNTER — Encounter: Payer: Self-pay | Admitting: *Deleted

## 2013-02-19 ENCOUNTER — Encounter: Payer: Self-pay | Admitting: Family Medicine

## 2013-02-19 ENCOUNTER — Ambulatory Visit (INDEPENDENT_AMBULATORY_CARE_PROVIDER_SITE_OTHER): Payer: Medicare Other | Admitting: Family Medicine

## 2013-02-19 VITALS — BP 127/71 | HR 86 | Temp 98.0°F | Ht 71.0 in | Wt 181.0 lb

## 2013-02-19 DIAGNOSIS — IMO0002 Reserved for concepts with insufficient information to code with codable children: Secondary | ICD-10-CM

## 2013-02-19 DIAGNOSIS — IMO0001 Reserved for inherently not codable concepts without codable children: Secondary | ICD-10-CM

## 2013-02-19 DIAGNOSIS — E1165 Type 2 diabetes mellitus with hyperglycemia: Secondary | ICD-10-CM

## 2013-02-19 LAB — CBC WITH DIFFERENTIAL/PLATELET
BASOS ABS: 0 10*3/uL (ref 0.0–0.1)
Basophils Relative: 1 % (ref 0–1)
EOS PCT: 2 % (ref 0–5)
Eosinophils Absolute: 0.1 10*3/uL (ref 0.0–0.7)
HCT: 39.2 % (ref 39.0–52.0)
Hemoglobin: 13.5 g/dL (ref 13.0–17.0)
LYMPHS PCT: 26 % (ref 12–46)
Lymphs Abs: 1.4 10*3/uL (ref 0.7–4.0)
MCH: 32.2 pg (ref 26.0–34.0)
MCHC: 34.4 g/dL (ref 30.0–36.0)
MCV: 93.6 fL (ref 78.0–100.0)
Monocytes Absolute: 0.4 10*3/uL (ref 0.1–1.0)
Monocytes Relative: 7 % (ref 3–12)
NEUTROS ABS: 3.4 10*3/uL (ref 1.7–7.7)
NEUTROS PCT: 64 % (ref 43–77)
PLATELETS: 132 10*3/uL — AB (ref 150–400)
RBC: 4.19 MIL/uL — AB (ref 4.22–5.81)
RDW: 14.6 % (ref 11.5–15.5)
WBC: 5.4 10*3/uL (ref 4.0–10.5)

## 2013-02-19 LAB — COMPREHENSIVE METABOLIC PANEL
ALT: 24 U/L (ref 0–53)
AST: 21 U/L (ref 0–37)
Albumin: 4.2 g/dL (ref 3.5–5.2)
Alkaline Phosphatase: 49 U/L (ref 39–117)
BUN: 31 mg/dL — ABNORMAL HIGH (ref 6–23)
CALCIUM: 9 mg/dL (ref 8.4–10.5)
CHLORIDE: 102 meq/L (ref 96–112)
CO2: 24 mEq/L (ref 19–32)
CREATININE: 1.47 mg/dL — AB (ref 0.50–1.35)
Glucose, Bld: 195 mg/dL — ABNORMAL HIGH (ref 70–99)
POTASSIUM: 4.8 meq/L (ref 3.5–5.3)
SODIUM: 137 meq/L (ref 135–145)
TOTAL PROTEIN: 6.8 g/dL (ref 6.0–8.3)
Total Bilirubin: 0.5 mg/dL (ref 0.3–1.2)

## 2013-02-19 LAB — POCT GLYCOSYLATED HEMOGLOBIN (HGB A1C): HEMOGLOBIN A1C: 10.9

## 2013-02-19 MED ORDER — METFORMIN HCL 500 MG PO TABS: 500.0000 mg | ORAL_TABLET | Freq: Two times a day (BID) | ORAL | Status: DC

## 2013-02-19 MED ORDER — ONETOUCH DELICA LANCETS 33G MISC: 1.0000 | Freq: Four times a day (QID) | Status: DC | PRN

## 2013-02-19 MED ORDER — ROSUVASTATIN CALCIUM 5 MG PO TABS: 5.0000 mg | ORAL_TABLET | Freq: Every day | ORAL | Status: DC

## 2013-02-19 MED ORDER — GLUCOSE BLOOD VI STRP: ORAL_STRIP | Status: DC

## 2013-02-19 MED ORDER — DONEPEZIL HCL 10 MG PO TABS: 10.0000 mg | ORAL_TABLET | Freq: Every day | ORAL | Status: DC

## 2013-02-19 MED ORDER — AMLODIPINE BESYLATE 10 MG PO TABS: 10.0000 mg | ORAL_TABLET | Freq: Every day | ORAL | Status: DC

## 2013-02-19 MED ORDER — SERTRALINE HCL 100 MG PO TABS: 100.0000 mg | ORAL_TABLET | Freq: Every day | ORAL | Status: DC

## 2013-02-19 MED ORDER — MEMANTINE HCL 10 MG PO TABS: 10.0000 mg | ORAL_TABLET | Freq: Every day | ORAL | Status: DC

## 2013-02-19 NOTE — Patient Instructions (Addendum)
Great to meet you guys!  Lets get records Dr. Selena BattenKim sent  Add back 3 units novolog three times daily with meals.   Keep track of fasting blood sugars and some 2 hours after meals.   Come back in 1 month

## 2013-02-19 NOTE — Progress Notes (Signed)
Patient ID: Dakota White, male   DOB: 04-19-1928, 80 y.o.   MRN: [id number]  Kevin Fenton, MD Phone: [phone]  Subjective:  Chief complaint-noted  # Pt here to establish care  His daughter [name] ([phone]) is his health care power of attorney accompanied him to his visit this morning. She explains that he is at least moderately demented on Namenda and Aricept and is having lots of troubles with controlling his diabetes.  She lives in [place] her lawn and so cannot help him with day-to-day control. He lives at home with his son whom it sounds like cannot help him 100% with his medications either.   He takes 20 units of Lantus in the morning and 70 2 at night. He's not taken NovoLog and several years but is currently prescribed. With his dementia it's very difficult to control his diet and he eats lots of sugar in the form of candy. He also forgets to brush his teeth and says had several cavities needing pulling teeth recently.  Review blood sugars range is 80-450. Average is 175 to 250  Patient is seen at the [place] and states that he does not have a primary care physician  ROS- Per HPI  Past Medical History Patient Active Problem List   Diagnosis Date Noted  . DM (diabetes mellitus), type 2, uncontrolled 02/01/2012    Class: Chronic  . Acute on chronic kidney failure 01/31/2012    Class: Acute  . Third degree heart block 01/30/2012    Class: Acute  . Acute diastolic heart failure 01/30/2012    Class: Acute  . Dementia 01/30/2012    Class: Chronic  . HYPERTENSION 11/17/2007  . ASTHMA 11/17/2007  . PERIPHERAL NEUROPATHY 11/08/2007  . ALLERGIC RHINITIS 11/08/2007  . BRONCHITIS, CHRONIC 11/08/2007    Medications- reviewed and updated Current Outpatient Prescriptions  Medication Sig Dispense Refill  . amLODipine (NORVASC) 10 MG tablet Take 1 tablet (10 mg total) by mouth at bedtime.  30 tablet  11  . aspirin EC 81 MG tablet Take 81 mg by mouth at bedtime.       . donepezil (ARICEPT) 10 MG tablet Take 1 tablet (10 mg total) by mouth at bedtime.  30 tablet  11  . finasteride (PROSCAR) 5 MG tablet Take 5 mg by mouth at bedtime.      . insulin glargine (LANTUS) 100 UNIT/ML injection Inject 60 Units into the skin at bedtime.  10 mL  1  . memantine (NAMENDA) 10 MG tablet Take 1 tablet (10 mg total) by mouth at bedtime.  30 tablet  11  . mirtazapine (REMERON) 15 MG tablet Take 7.5 mg by mouth at bedtime.      . rosuvastatin (CRESTOR) 5 MG tablet Take 1 tablet (5 mg total) by mouth at bedtime.  30 tablet  11  . saw palmetto 500 MG capsule Take 450 mg by mouth daily.      . sertraline (ZOLOFT) 100 MG tablet Take 1 tablet (100 mg total) by mouth at bedtime.  30 tablet  11  . Bromocriptine Mesylate (CYCLOSET) 0.8 MG TABS Take 0.8 mg by mouth every morning.      Marland Kitchen glucose blood (ONE TOUCH ULTRA TEST) test strip Use as instructed  200 each  12  . insulin aspart (NOVOLOG) 100 UNIT/ML injection Inject 12-16 Units into the skin 3 (three) times daily before meals. Sliding scale if glucose is over 150      . ONETOUCH DELICA LANCETS 33G MISC 1 Device  by Does not apply route 4 (four) times daily as needed.  200 each  11   No current facility-administered medications for this visit.    Objective: BP 127/71  Pulse 86  Temp(Src) 98 F (36.7 C) (Oral)  Ht 5\' 11"  (1.803 m)  Wt 181 lb (82.101 kg)  BMI 25.26 kg/m2 Gen: NAD, alert, cooperative with exam,  HEENT: NCAT, MMM CV:   regular rate, no murmur,  S1/S2, pacemaker palpable in left upper chest Resp: CTABL, no wheezes, non-labored Abd: SNTND, BS present, no guarding or organomegaly Ext: No edema, warm   Assessment/Plan:  DM (diabetes mellitus), type 2, uncontrolled Uncontrolled with A1c of 10.9 today Continue current Lantus dose 20 units in the morning and 72 units at night, discussed that 45 twice a day but had the same effect Had back 5 units of NovoLog 3 times a day a.c. Consider metformin, prescribed,  but this is contraindicated with his last creatinine of 1.8 so called daughter and advised not to take the medication. The big challenge will be getting his medications and blood sugar recordings given his moderate dementia. Will followup in one month.     Orders Placed This Encounter  Procedures  . CBC with Differential  . Comprehensive metabolic panel  . POCT A1C    Meds ordered this encounter  Medications  . saw palmetto 500 MG capsule    Sig: Take 450 mg by mouth daily.  . Bromocriptine Mesylate (CYCLOSET) 0.8 MG TABS    Sig: Take 0.8 mg by mouth every morning.  . sertraline (ZOLOFT) 100 MG tablet    Sig: Take 1 tablet (100 mg total) by mouth at bedtime.    Dispense:  30 tablet    Refill:  11  . rosuvastatin (CRESTOR) 5 MG tablet    Sig: Take 1 tablet (5 mg total) by mouth at bedtime.    Dispense:  30 tablet    Refill:  11  . donepezil (ARICEPT) 10 MG tablet    Sig: Take 1 tablet (10 mg total) by mouth at bedtime.    Dispense:  30 tablet    Refill:  11  . amLODipine (NORVASC) 10 MG tablet    Sig: Take 1 tablet (10 mg total) by mouth at bedtime.    Dispense:  30 tablet    Refill:  11  . memantine (NAMENDA) 10 MG tablet    Sig: Take 1 tablet (10 mg total) by mouth at bedtime.    Dispense:  30 tablet    Refill:  11  . glucose blood (ONE TOUCH ULTRA TEST) test strip    Sig: Use as instructed    Dispense:  200 each    Refill:  12  . ONETOUCH DELICA LANCETS 33G MISC    Sig: 1 Device by Does not apply route 4 (four) times daily as needed.    Dispense:  200 each    Refill:  11  . DISCONTD: metFORMIN (GLUCOPHAGE) 500 MG tablet    Sig: Take 1 tablet (500 mg total) by mouth 2 (two) times daily with a meal.    Dispense:  60 tablet    Refill:  2

## 2013-02-19 NOTE — Assessment & Plan Note (Signed)
Uncontrolled with A1c of 10.9 today Continue current Lantus dose 20 units in the morning and 72 units at night, discussed that 45 twice a day but had the same effect Had back 5 units of NovoLog 3 times a day a.c. Consider metformin, prescribed, but this is contraindicated with his last creatinine of 1.8 so called daughter and advised not to take the medication. The big challenge will be getting his medications and blood sugar recordings given his moderate dementia. Will followup in one month.

## 2013-02-23 ENCOUNTER — Encounter: Payer: Self-pay | Admitting: Family Medicine

## 2013-03-29 ENCOUNTER — Ambulatory Visit: Payer: Medicare Other | Admitting: Family Medicine

## 2013-04-05 ENCOUNTER — Encounter: Payer: Self-pay | Admitting: Family Medicine

## 2013-04-05 ENCOUNTER — Ambulatory Visit (INDEPENDENT_AMBULATORY_CARE_PROVIDER_SITE_OTHER): Payer: Medicare Other | Admitting: Family Medicine

## 2013-04-05 VITALS — BP 145/65 | HR 70 | Ht 71.0 in | Wt 180.0 lb

## 2013-04-05 DIAGNOSIS — IMO0001 Reserved for inherently not codable concepts without codable children: Secondary | ICD-10-CM

## 2013-04-05 DIAGNOSIS — E1165 Type 2 diabetes mellitus with hyperglycemia: Secondary | ICD-10-CM

## 2013-04-05 DIAGNOSIS — IMO0002 Reserved for concepts with insufficient information to code with codable children: Secondary | ICD-10-CM

## 2013-04-05 DIAGNOSIS — F0390 Unspecified dementia without behavioral disturbance: Secondary | ICD-10-CM

## 2013-04-05 DIAGNOSIS — G609 Hereditary and idiopathic neuropathy, unspecified: Secondary | ICD-10-CM | POA: Diagnosis not present

## 2013-04-05 MED ORDER — GLUCOSE BLOOD VI STRP: ORAL_STRIP | Status: DC

## 2013-04-05 NOTE — Assessment & Plan Note (Signed)
Diabetic neuropathy, he's experienced some pain intermittently As this seems mild and not limiting his activities I will avoid the use of gabapentin or similar drugs considering his dementia and mental status. Continue to follow this and encouraged his caretaker to check his feet daily.

## 2013-04-05 NOTE — Assessment & Plan Note (Signed)
And controlled last A1c 10.9 At this time I think it's most appropriate to shift our focus from trying to get an A1c of 7.0 to keeping him from becoming dehydrated or hypoglycemic. The use of insulin this patient given his social situation and support seems like a very dangerous situation. Currently he scheduled 70 units of Lantus at night when his college student grandson is responsible for injecting it We'll discontinue NovoLog and reduce the dose of his Lantus to 70 units daily to be given by his caretaker. He is currently on 90 units total of Lantus daily, however I am hesitant to start 90 units by his caretaker once daily as I think he'll get much better compliance this way and may experience hypoglycemia. Continue frequent blood sugar checks, followup in one month.

## 2013-04-05 NOTE — Patient Instructions (Signed)
Decrease his insulin regimen to just 70 units of lantus once a day.   Stop the novolog Continue the 2-3 times a day blood sugar checks  Come back in 1 month

## 2013-04-05 NOTE — Assessment & Plan Note (Signed)
So far I have not had a chance to formally test him using and in an MMSE Will plan to do this on my next visit in 2 months.  Will see my colleague next month to ensure safety with his insulin.

## 2013-04-05 NOTE — Progress Notes (Signed)
Patient ID: Dakota White, male   DOB: 1928-05-14, 80 y.o.   MRN: [id number]  Kevin Fenton, MD Phone: [phone]  Subjective:  Chief complaint-noted  # Followup for diabetes and diabetic neuropathy  Diabetes Provided log of blood sugars with fasting blood sugars ranging 90 to 130 and postprandial blood sugars from 170-340  Medication compliance is definitely concern with him, he has severe dementia and is dependent on caretaker during the day and family members to help him with insulin at night. I talked to his daughter has been most involved with his health care, but lives in [place], who states that her brother who lives in the house with them and has recently gotten a DUI had a red and is in alcohol rehabilitation now. If person lives in the house is Christiane Ha his grandson who is a Archivist and comes and goes.  His caretaker, rose, takes care of him 5 days a week and ensure that he gets the Lantus shots and no one shots prescribed She is concerned that he's not getting insulin or blood sugar checks during the weekend as everyone is frequently gone from the house.  Diabetic neuropathy Caretaker states that he sometimes complains of pain in his bilateral feet whenever walking but this is infrequent. It also does not seem to keep him from walking. His daughter states that he stumbles around the house.  During the interviewdiscuss his case by phone with his daughter. I explained that I am concerned that his insulin use is unsafe at this time and obviously unobtainable to do correctly with his current social support. I feel that he can get good care during the day 5 days a week but as of right now he is relying on a college student to help him at night on the weekends with the bulk of his diabetic treatment. She explains that she agrees that he's not getting great care with her brother and grandchild and has considered having him move to [facility] relevant. She  states that he very much does not want to leave the house that he and his wife built, and also to her house and she also will not support him as he stumbles around in the night. She's consider pursuing nursing home care but is afraid that he be in the memory unit and that that would be very hard on him.   As of right now he is taking 20 units of Lantus in the morning and 70 units at night. He was previously taking a sliding scale of NovoLog which we changed to 5 units at every meal.  ROS- No fever, chills, sweats   some numbness and tingling in the hands and feet No chest pain No dyspnea    Past Medical History Patient Active Problem List   Diagnosis Date Noted  . DM (diabetes mellitus), type 2, uncontrolled 02/01/2012    Class: Chronic  . Acute on chronic kidney failure 01/31/2012    Class: Acute  . Third degree heart block 01/30/2012    Class: Acute  . Acute diastolic heart failure 01/30/2012    Class: Acute  . Dementia 01/30/2012    Class: Chronic  . HYPERTENSION 11/17/2007  . ASTHMA 11/17/2007  . PERIPHERAL NEUROPATHY 11/08/2007  . ALLERGIC RHINITIS 11/08/2007  . BRONCHITIS, CHRONIC 11/08/2007    Medications- reviewed and updated Current Outpatient Prescriptions  Medication Sig Dispense Refill  . amLODipine (NORVASC) 10 MG tablet Take 1 tablet (10 mg total) by mouth at bedtime.  30 tablet  11  . aspirin EC 81 MG tablet Take 81 mg by mouth at bedtime.      . Bromocriptine Mesylate (CYCLOSET) 0.8 MG TABS Take 0.8 mg by mouth every morning.      . donepezil (ARICEPT) 10 MG tablet Take 1 tablet (10 mg total) by mouth at bedtime.  30 tablet  11  . finasteride (PROSCAR) 5 MG tablet Take 5 mg by mouth at bedtime.      Marland Kitchen. glucose blood (ONE TOUCH ULTRA TEST) test strip Use as instructed  200 each  12  . insulin aspart (NOVOLOG) 100 UNIT/ML injection Inject 12-16 Units into the skin 3 (three) times daily before meals. Sliding scale if glucose is over 150      . insulin glargine  (LANTUS) 100 UNIT/ML injection Inject 60 Units into the skin at bedtime.  10 mL  1  . memantine (NAMENDA) 10 MG tablet Take 1 tablet (10 mg total) by mouth at bedtime.  30 tablet  11  . mirtazapine (REMERON) 15 MG tablet Take 7.5 mg by mouth at bedtime.      [name]. ONETOUCH DELICA LANCETS 33G MISC 1 Device by Does not apply route 4 (four) times daily as needed.  200 each  11  . rosuvastatin (CRESTOR) 5 MG tablet Take 1 tablet (5 mg total) by mouth at bedtime.  30 tablet  11  . saw palmetto 500 MG capsule Take 450 mg by mouth daily.      . sertraline (ZOLOFT) 100 MG tablet Take 1 tablet (100 mg total) by mouth at bedtime.  30 tablet  11   No current facility-administered medications for this visit.    Objective: BP 145/65  Pulse 70  Ht 5\' 11"  (1.803 m)  Wt 180 lb (81.647 kg)  BMI 25.12 kg/m2 Gen: NAD, alert, cooperative with exam HEENT: NCAT CV: RRR, good S1/S2, no murmur Resp: CTABL, no wheezes, non-labored Ext: No edema, warm  Diabetic foot exam: Sensation present to monofilament except on left great toe and the pad of his right heel. 2+ DP pulse, no lesions   Assessment/Plan:  DM (diabetes mellitus), type 2, uncontrolled And controlled last A1c 10.9 At this time I think it's most appropriate to shift our focus from trying to get an A1c of 7.0 to keeping him from becoming dehydrated or hypoglycemic. The use of insulin this patient given his social situation and support seems like a very dangerous situation. Currently he scheduled 70 units of Lantus at night when his college student grandson is responsible for injecting it We'll discontinue NovoLog and reduce the dose of his Lantus to 70 units daily to be given by his caretaker. He is currently on 90 units total of Lantus daily, however I am hesitant to start 90 units by his caretaker once daily as I think he'll get much better compliance this way and may experience hypoglycemia. Continue frequent blood sugar checks, followup in one  month.  PERIPHERAL NEUROPATHY Diabetic neuropathy, he's experienced some pain intermittently As this seems mild and not limiting his activities I will avoid the use of gabapentin or similar drugs considering his dementia and mental status. Continue to follow this and encouraged his caretaker to check his feet daily.  Dementia So far I have not had a chance to formally test him using and in an MMSE Will plan to do this on my next visit in 2 months.  Will see my colleague next month to ensure safety with his insulin.

## 2013-04-13 ENCOUNTER — Other Ambulatory Visit: Payer: Self-pay | Admitting: Family Medicine

## 2013-04-13 DIAGNOSIS — E1165 Type 2 diabetes mellitus with hyperglycemia: Secondary | ICD-10-CM

## 2013-04-13 DIAGNOSIS — IMO0002 Reserved for concepts with insufficient information to code with codable children: Secondary | ICD-10-CM

## 2013-04-13 MED ORDER — GLUCOSE BLOOD VI STRP: ORAL_STRIP | Status: DC

## 2013-04-16 ENCOUNTER — Telehealth: Payer: Self-pay | Admitting: Family Medicine

## 2013-04-16 ENCOUNTER — Other Ambulatory Visit: Payer: Self-pay | Admitting: Family Medicine

## 2013-04-16 DIAGNOSIS — IMO0002 Reserved for concepts with insufficient information to code with codable children: Secondary | ICD-10-CM

## 2013-04-16 DIAGNOSIS — E1165 Type 2 diabetes mellitus with hyperglycemia: Secondary | ICD-10-CM

## 2013-04-16 MED ORDER — GLUCOSE BLOOD VI STRP: ORAL_STRIP | Status: DC

## 2013-04-16 NOTE — Telephone Encounter (Signed)
Sent another Rx for Ultra blue test strips with indication.   Murtis SinkSam Bradshaw, MD [facility] Resident, PGY-2 04/16/2013, 3:49 PM

## 2013-04-16 NOTE — Telephone Encounter (Signed)
Per [facility], they need a new prescription for patient's test strips.  Medicare requires a written Rx, faxed, with specific quantity of strips and specific directions, along with ICD9 code.  Will fwd to MD for completion.  Dakota White, Darlyne RussianKristen L, CMA

## 2013-04-16 NOTE — Telephone Encounter (Signed)
Daughter called because [facility] said that the diagnoses code is incorrect for the one touch test strips and can not fill them. Can we call over there and get this straighten out since [facility] is out of strips and the daughter is 5 hours away.  Please call Tamela OddiBetsy and let her know when this is done. jw

## 2013-04-18 NOTE — Telephone Encounter (Signed)
Mrs. Newman NipBranch-Lewis,daughter of [name] called back regarding corrected rx for Mr. Verna CzechBranch's test strips.  Had to purchase 10 from pharmacy until the right rx came through.  Were purchased on Monday, so need to have these sent in asap.  Please call her at [phone] when sent.  Don't want to keep calling [facility] if not processed.

## 2013-04-18 NOTE — Telephone Encounter (Signed)
Will ask staff to call pharmacy to fax the test strip Rx form. I have sent this Rx several times but it doesn't seem to be working.  \ Murtis SinkSam Tan Clopper, MD [facility] Resident, PGY-2 04/18/2013, 3:48 PM

## 2013-04-20 NOTE — Telephone Encounter (Signed)
Spoke with Clydie BraunKaren the pharmacist at [facility].  A new Rx faxed with all current info for One Touch Ultra Test Strips, check sugar four times daily, disp #200 with 11RFs, ICD9 code: 250.02.  Faxed to [phone].  Notified patient's daughter.  Alfrieda Tarry, Darlyne RussianKristen L, CMA

## 2013-04-28 ENCOUNTER — Encounter: Payer: Self-pay | Admitting: *Deleted

## 2013-05-08 ENCOUNTER — Ambulatory Visit: Payer: Medicare Other | Admitting: Family Medicine

## 2013-05-21 ENCOUNTER — Ambulatory Visit: Payer: Medicare Other | Admitting: Family Medicine

## 2013-05-21 ENCOUNTER — Telehealth: Payer: Self-pay | Admitting: Family Medicine

## 2013-05-21 NOTE — Telephone Encounter (Signed)
Pt cancelled appt today.  Will FWD to MD as Lorain ChildesFYI.  Radene OuKristen L Yudit Modesitt, CMA

## 2013-05-21 NOTE — Telephone Encounter (Signed)
Covering for Dr. Ermalinda MemosBradshaw while he is away. Will leave this message in his inbox so he can see it when he returns.  Latrelle DodrillBrittany J McIntyre, MD
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
## 2013-05-28 NOTE — Telephone Encounter (Signed)
Labs and OV from 02/19/2013 and OV from 04/05/2013 printed and mailed to [place], healthcare power of attorney for patient.  Radene OuKristen L Keyle Doby, CMA

## 2013-05-28 NOTE — Telephone Encounter (Signed)
Will ask nursing to arrange printing and mailing notes to [name]. It sounds like his dementia is progressing, will plan to [facility].   Murtis SinkSam Ziomara Birenbaum, MD [facility] Resident, PGY-2 05/28/2013, 11:23 AM

## 2013-06-13 ENCOUNTER — Encounter: Payer: Self-pay | Admitting: Family Medicine

## 2013-06-13 ENCOUNTER — Ambulatory Visit (INDEPENDENT_AMBULATORY_CARE_PROVIDER_SITE_OTHER): Payer: Medicare Other | Admitting: Family Medicine

## 2013-06-13 VITALS — BP 109/69 | HR 77 | Temp 97.7°F | Wt 178.9 lb

## 2013-06-13 DIAGNOSIS — F0390 Unspecified dementia without behavioral disturbance: Secondary | ICD-10-CM | POA: Diagnosis not present

## 2013-06-13 DIAGNOSIS — N400 Enlarged prostate without lower urinary tract symptoms: Secondary | ICD-10-CM | POA: Diagnosis not present

## 2013-06-13 DIAGNOSIS — IMO0002 Reserved for concepts with insufficient information to code with codable children: Secondary | ICD-10-CM

## 2013-06-13 DIAGNOSIS — E1165 Type 2 diabetes mellitus with hyperglycemia: Secondary | ICD-10-CM

## 2013-06-13 DIAGNOSIS — I442 Atrioventricular block, complete: Secondary | ICD-10-CM | POA: Diagnosis not present

## 2013-06-13 DIAGNOSIS — IMO0001 Reserved for inherently not codable concepts without codable children: Secondary | ICD-10-CM

## 2013-06-13 LAB — POCT GLYCOSYLATED HEMOGLOBIN (HGB A1C): Hemoglobin A1C: 8.5

## 2013-06-13 NOTE — Assessment & Plan Note (Signed)
Denies urinary problems on questioning, continue finasteride and saw palmetto

## 2013-06-13 NOTE — Assessment & Plan Note (Signed)
A1c today improved to 8.5. CBGs reasonable, he's only had better compliance for the last month or so so I think his A1c will improve to better than 0.5 in 3 months. Continue Lantus 70 units daily, no hypoglycemic episodes. Followup 3 months
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
## 2013-06-13 NOTE — Assessment & Plan Note (Addendum)
Patient appears to be slowly declining for his dementia, now making sexual enhances. Continue Aricept and Namenda Continue Zoloft and Remeron I think the Zoloft may provide some benefit with his hypersexuality and this is likely a normal progression of his dementia. I think a neurologist opinion be helpful, asked them to see if they could [facility] with [facility], otherwise they'll gladly make referral. Interestingly, was previously on, bromocriptine and the family can explain it, no signs of parkinsonism today. MMSE today is scored 23 of 30 losing points mostly on orientation questions as well as copying a picture. I wrote a letter today stating that his dementia severe enough to limit his financial making responsibilities and ask that his daughter, Kathie RhodesBetty Lipton, act on his behalf.    I think he probably needs 24-hour supervision as he clearly can't perform all of his ADLs and time alone for him is dangerous. It will be difficult for him and the family that this time we should pursue placement in a memory unit. Will ask social work for assistance accomplishing this.

## 2013-06-13 NOTE — Patient Instructions (Signed)
Great to see you guys today!  I will talk to our social worker about trying to get him placed in a nursing home.   Check to see [facility].

## 2013-06-14 NOTE — Progress Notes (Signed)
Is the family wanting placement for the pt? Unfortunately they will have to pay privately... I can call the patient's family with this information.  Theresia BoughNorma Ember Gottwald, MSW, LCSW [phone]

## 2013-06-18 ENCOUNTER — Telehealth: Payer: Self-pay | Admitting: Clinical

## 2013-06-18 NOTE — Telephone Encounter (Signed)
CSW received a referral to contact pt daughter to discuss SNF placement. Daughter very appreciative of call however stated she does not feel pt would do well in a nursing home for several reasons (new environment, small space and being away from his home.) CSW suggested an [facility] program [facility] as pt could receive care during the week (8-5p?) and return home in the evenings. Daughter stated she would do some research and possibly call [facility] as this seemed like a better option for pt. Daughter also confirmed that pt has a caretaker from [phone] and pt son lives with pt. Daughter will contact CSW with outcome/support.  Theresia BoughNorma Chozen Latulippe, MSW, LCSW [phone]

## 2013-07-01 ENCOUNTER — Other Ambulatory Visit: Payer: Self-pay | Admitting: Family Medicine

## 2013-07-10 ENCOUNTER — Encounter: Payer: Self-pay | Admitting: Interventional Cardiology

## 2013-07-25 ENCOUNTER — Encounter: Payer: Self-pay | Admitting: *Deleted

## 2013-08-08 ENCOUNTER — Ambulatory Visit: Payer: Medicare Other | Admitting: Interventional Cardiology

## 2013-08-20 ENCOUNTER — Ambulatory Visit (INDEPENDENT_AMBULATORY_CARE_PROVIDER_SITE_OTHER): Payer: Medicare Other | Admitting: *Deleted

## 2013-08-20 ENCOUNTER — Encounter: Payer: Self-pay | Admitting: Interventional Cardiology

## 2013-08-20 ENCOUNTER — Ambulatory Visit (INDEPENDENT_AMBULATORY_CARE_PROVIDER_SITE_OTHER): Payer: Medicare Other | Admitting: Interventional Cardiology

## 2013-08-20 VITALS — BP 103/58 | HR 86 | Ht 71.0 in | Wt 173.0 lb

## 2013-08-20 DIAGNOSIS — I10 Essential (primary) hypertension: Secondary | ICD-10-CM | POA: Diagnosis not present

## 2013-08-20 DIAGNOSIS — N179 Acute kidney failure, unspecified: Secondary | ICD-10-CM | POA: Diagnosis not present

## 2013-08-20 DIAGNOSIS — I442 Atrioventricular block, complete: Secondary | ICD-10-CM | POA: Diagnosis not present

## 2013-08-20 DIAGNOSIS — Z950 Presence of cardiac pacemaker: Secondary | ICD-10-CM

## 2013-08-20 DIAGNOSIS — N189 Chronic kidney disease, unspecified: Secondary | ICD-10-CM

## 2013-08-20 LAB — MDC_IDC_ENUM_SESS_TYPE_INCLINIC
Battery Remaining Longevity: 139.2 mo
Battery Voltage: 2.99 V
Brady Statistic RV Percent Paced: 99.88 %
Date Time Interrogation Session: 20150720130425
Implantable Pulse Generator Model: 2210
Implantable Pulse Generator Serial Number: 7438506
Lead Channel Pacing Threshold Amplitude: 0.625 V
Lead Channel Pacing Threshold Amplitude: 0.625 V
Lead Channel Pacing Threshold Pulse Width: 0.5 ms
Lead Channel Sensing Intrinsic Amplitude: 3.4 mV
Lead Channel Setting Pacing Amplitude: 0.875
Lead Channel Setting Pacing Pulse Width: 0.5 ms
MDC IDC MSMT LEADCHNL RA IMPEDANCE VALUE: 512.5 Ohm
MDC IDC MSMT LEADCHNL RV IMPEDANCE VALUE: 637.5 Ohm
MDC IDC MSMT LEADCHNL RV PACING THRESHOLD PULSEWIDTH: 0.5 ms
MDC IDC MSMT LEADCHNL RV SENSING INTR AMPL: 12 mV
MDC IDC SET LEADCHNL RA PACING AMPLITUDE: 1.625
MDC IDC SET LEADCHNL RV SENSING SENSITIVITY: 4 mV
MDC IDC STAT BRADY RA PERCENT PACED: 0.68 %

## 2013-08-20 NOTE — Progress Notes (Signed)
Pacemaker check in clinic. Normal device function. Thresholds, sensing, impedances consistent with previous measurements. Device programmed to maximize longevity. 3 mode switches--- <1%, longest 302min54 sec, peak-A 167bpm. No high ventricular rates noted. 1 PMT. Device programmed at appropriate safety margins. Histogram distribution appropriate for patient activity level. Device programmed to optimize intrinsic conduction. Estimated longevity 11.0-11.4172yrs. ROV w/ Dr. Ladona Ridgelaylor in 33mo unless pt connects Merlin.

## 2013-08-20 NOTE — Progress Notes (Addendum)
Patient ID: Dakota White, male   DOB: 08/21/1928, 80 y.o.   MRN: [id number]    [address] [place], [place]  [address] Phone: [phone] Fax:  [phone]  Date:  08/20/2013   ID:  Dakota White, DOB 16-Mar-1928, MRN [id number]  PCP:  Kevin Fenton, MD   ASSESSMENT:  1. Third-degree heart block with DDD pacemaker, and normal function. No interval symptoms 2. Dementia 3. Chronic diastolic heart failure, asymptomatic 4. Hypertension with good blood pressure control  PLAN:  1. Continue to monitor pacemaker function device clinic 2. Clinical followup in one year   SUBJECTIVE: Dakota White is a 80 y.o. male who has no complaints. He is accompanied by his daughter. He has significant dementia. He voices no complaints.   Wt Readings from Last 3 Encounters:  08/20/13 173 lb (78.472 kg)  06/13/13 178 lb 14.4 oz (81.149 kg)  04/05/13 180 lb (81.647 kg)     Past Medical History  Diagnosis Date  . Type II or unspecified type diabetes mellitus without mention of complication, uncontrolled   . Essential hypertension, benign   . Mental disorder   . Dementia   . Peripheral neuropathy   . Allergic rhinitis   . Asthma with bronchitis   . Complete heart block     Requiring permanent [employer] DDD pacemaker by Dr. Ladona Ridgel  . Peripheral vascular disease, unspecified   . Cardiac pacemaker in situ     Normal function  . History of echocardiogram 01/2012    EF 60-65%   . Mixed hyperlipidemia     Current Outpatient Prescriptions  Medication Sig Dispense Refill  . amLODipine (NORVASC) 10 MG tablet Take 1 tablet (10 mg total) by mouth at bedtime.  30 tablet  11  . aspirin EC 81 MG tablet Take 81 mg by mouth at bedtime.      . donepezil (ARICEPT) 10 MG tablet Take 1 tablet (10 mg total) by mouth at bedtime.  30 tablet  11  . finasteride (PROSCAR) 5 MG tablet TAKE 1 TABLET BY MOUTH EVERY DAY  60 tablet  1  . glucose blood test strip Once daily      . insulin  glargine (LANTUS) 100 UNIT/ML injection Inject 70 Units into the skin at bedtime.      . memantine (NAMENDA) 10 MG tablet Take 1 tablet (10 mg total) by mouth at bedtime.  30 tablet  11  . mirtazapine (REMERON) 15 MG tablet Take 7.5 mg by mouth at bedtime.      [name] DELICA LANCETS 33G MISC 1 Device by Does not apply route 4 (four) times daily as needed.  200 each  11  . rosuvastatin (CRESTOR) 5 MG tablet Take 1 tablet (5 mg total) by mouth at bedtime.  30 tablet  11  . saw palmetto 500 MG capsule Take 450 mg by mouth daily.      . sertraline (ZOLOFT) 100 MG tablet Take 1 tablet (100 mg total) by mouth at bedtime.  30 tablet  11   No current facility-administered medications for this visit.    Allergies:    Allergies  Allergen Reactions  . Actos [Pioglitazone]   . Ramipril     Social History:  The patient  reports that he has quit smoking. His smoking use included Cigarettes. He smoked 0.00 packs per day. He has never used smokeless tobacco. He reports that he does not drink alcohol or use illicit drugs.   ROS:  Please see the history of present illness.   No claudication or neurological complaints   All other systems reviewed and negative.   OBJECTIVE: VS:  BP 103/58  Pulse 86  Ht 5\' 11"  (1.803 m)  Wt 173 lb (78.472 kg)  BMI 24.14 kg/m2 Well nourished, well developed, in no acute distress, elderly HEENT: normal Neck: JVD flat. Carotid bruit absent  Cardiac:  normal S1, S2; RRR; no murmur Lungs:  clear to auscultation bilaterally, no wheezing, rhonchi or rales Abd: soft, nontender, no hepatomegaly Ext: Edema absent. Pulses 2+ Skin: warm and dry Neuro:  CNs 2-12 intact, no focal abnormalities noted  EKG:  Left atrial with atrial tracking and ventricular pacing appear    Signed, Darci NeedleHenry W. B. Chirsty Armistead III, MD 08/20/2013 11:52 AM

## 2013-08-20 NOTE — Patient Instructions (Signed)
Your physician recommends that you continue on your current medications as directed. Please refer to the Current Medication list given to you today.  Your physician wants you to follow-up in: 1 year with Dr.Smith You will receive a reminder letter in the mail two months in advance. If you don't receive a letter, please call our office to schedule the follow-up appointment.  

## 2013-09-26 ENCOUNTER — Encounter: Payer: Self-pay | Admitting: Internal Medicine

## 2014-01-10 ENCOUNTER — Encounter (HOSPITAL_COMMUNITY): Payer: Self-pay | Admitting: Internal Medicine

## 2014-01-30 ENCOUNTER — Telehealth: Payer: Self-pay | Admitting: *Deleted

## 2014-01-30 NOTE — Telephone Encounter (Signed)
Spoke with pt daughter and she said she would need to contact her brother to see if her father had already had his flu shot and if not she would get him scheduled to get it after he gets over sinus/bronchitis type symptoms. Dakota White, [name] D

## 2014-03-07 ENCOUNTER — Encounter: Payer: Self-pay | Admitting: *Deleted

## 2014-03-28 ENCOUNTER — Encounter: Payer: Self-pay | Admitting: *Deleted

## 2014-05-02 ENCOUNTER — Encounter: Payer: Self-pay | Admitting: *Deleted

## 2014-05-13 ENCOUNTER — Ambulatory Visit (INDEPENDENT_AMBULATORY_CARE_PROVIDER_SITE_OTHER): Payer: Medicare Other | Admitting: *Deleted

## 2014-05-13 DIAGNOSIS — I442 Atrioventricular block, complete: Secondary | ICD-10-CM

## 2014-05-13 LAB — MDC_IDC_ENUM_SESS_TYPE_INCLINIC
Brady Statistic RA Percent Paced: 0.28 %
Brady Statistic RV Percent Paced: 99.98 %
Date Time Interrogation Session: 20160411121344
Implantable Pulse Generator Model: 2210
Lead Channel Impedance Value: 425 Ohm
Lead Channel Impedance Value: 600 Ohm
Lead Channel Pacing Threshold Pulse Width: 0.5 ms
Lead Channel Pacing Threshold Pulse Width: 0.5 ms
Lead Channel Sensing Intrinsic Amplitude: 12 mV
Lead Channel Setting Pacing Amplitude: 1.625
Lead Channel Setting Sensing Sensitivity: 4 mV
MDC IDC MSMT BATTERY REMAINING LONGEVITY: 139.2 mo
MDC IDC MSMT BATTERY VOLTAGE: 2.99 V
MDC IDC MSMT LEADCHNL RA PACING THRESHOLD AMPLITUDE: 0.625 V
MDC IDC MSMT LEADCHNL RA SENSING INTR AMPL: 3.5 mV
MDC IDC MSMT LEADCHNL RV PACING THRESHOLD AMPLITUDE: 0.625 V
MDC IDC PG SERIAL: 7438506
MDC IDC SET LEADCHNL RV PACING AMPLITUDE: 0.875
MDC IDC SET LEADCHNL RV PACING PULSEWIDTH: 0.5 ms

## 2014-05-13 NOTE — Progress Notes (Signed)
Pacemaker check in clinic. Normal device function. Thresholds, sensing, impedances consistent with previous measurements. Device programmed to maximize longevity. 1 mode switch episode x 10 sec @ 205/70---AT. No high ventricular rates noted. Device programmed at appropriate safety margins. Histogram distribution appropriate for patient activity level. Device programmed to optimize intrinsic conduction. Estimated longevity 10.9-11.6 years. Patient will follow up with [facility] in 6 months.

## 2014-05-30 ENCOUNTER — Encounter: Payer: Self-pay | Admitting: Internal Medicine

## 2014-06-17 DIAGNOSIS — L03114 Cellulitis of left upper limb: Secondary | ICD-10-CM | POA: Diagnosis not present

## 2014-06-24 DIAGNOSIS — L03114 Cellulitis of left upper limb: Secondary | ICD-10-CM | POA: Diagnosis not present

## 2014-10-16 ENCOUNTER — Encounter (HOSPITAL_COMMUNITY): Payer: Self-pay | Admitting: Emergency Medicine

## 2014-10-16 ENCOUNTER — Emergency Department (HOSPITAL_COMMUNITY): Payer: Medicare Other

## 2014-10-16 ENCOUNTER — Inpatient Hospital Stay (HOSPITAL_COMMUNITY)
Admission: EM | Admit: 2014-10-16 | Discharge: 2014-10-17 | DRG: 392 | Disposition: A | Discharge status: Home / Self Care | Payer: Medicare Other | Attending: Internal Medicine | Admitting: Internal Medicine

## 2014-10-16 DIAGNOSIS — Z79899 Other long term (current) drug therapy: Secondary | ICD-10-CM | POA: Diagnosis not present

## 2014-10-16 DIAGNOSIS — E1165 Type 2 diabetes mellitus with hyperglycemia: Secondary | ICD-10-CM | POA: Diagnosis present

## 2014-10-16 DIAGNOSIS — R0600 Dyspnea, unspecified: Secondary | ICD-10-CM | POA: Diagnosis not present

## 2014-10-16 DIAGNOSIS — E1129 Type 2 diabetes mellitus with other diabetic kidney complication: Secondary | ICD-10-CM | POA: Diagnosis present

## 2014-10-16 DIAGNOSIS — E782 Mixed hyperlipidemia: Secondary | ICD-10-CM | POA: Diagnosis present

## 2014-10-16 DIAGNOSIS — Z950 Presence of cardiac pacemaker: Secondary | ICD-10-CM | POA: Diagnosis not present

## 2014-10-16 DIAGNOSIS — E1140 Type 2 diabetes mellitus with diabetic neuropathy, unspecified: Secondary | ICD-10-CM | POA: Diagnosis present

## 2014-10-16 DIAGNOSIS — E1122 Type 2 diabetes mellitus with diabetic chronic kidney disease: Secondary | ICD-10-CM | POA: Diagnosis present

## 2014-10-16 DIAGNOSIS — N390 Urinary tract infection, site not specified: Secondary | ICD-10-CM | POA: Diagnosis present

## 2014-10-16 DIAGNOSIS — F0390 Unspecified dementia without behavioral disturbance: Secondary | ICD-10-CM | POA: Diagnosis present

## 2014-10-16 DIAGNOSIS — R0609 Other forms of dyspnea: Secondary | ICD-10-CM | POA: Diagnosis not present

## 2014-10-16 DIAGNOSIS — I442 Atrioventricular block, complete: Secondary | ICD-10-CM | POA: Diagnosis present

## 2014-10-16 DIAGNOSIS — Z820 Family history of epilepsy and other diseases of the nervous system: Secondary | ICD-10-CM

## 2014-10-16 DIAGNOSIS — Z8249 Family history of ischemic heart disease and other diseases of the circulatory system: Secondary | ICD-10-CM

## 2014-10-16 DIAGNOSIS — J45909 Unspecified asthma, uncomplicated: Secondary | ICD-10-CM | POA: Diagnosis present

## 2014-10-16 DIAGNOSIS — IMO0002 Reserved for concepts with insufficient information to code with codable children: Secondary | ICD-10-CM | POA: Diagnosis present

## 2014-10-16 DIAGNOSIS — Z87891 Personal history of nicotine dependence: Secondary | ICD-10-CM

## 2014-10-16 DIAGNOSIS — I129 Hypertensive chronic kidney disease with stage 1 through stage 4 chronic kidney disease, or unspecified chronic kidney disease: Secondary | ICD-10-CM | POA: Diagnosis present

## 2014-10-16 DIAGNOSIS — E1151 Type 2 diabetes mellitus with diabetic peripheral angiopathy without gangrene: Secondary | ICD-10-CM | POA: Diagnosis present

## 2014-10-16 DIAGNOSIS — T3695XA Adverse effect of unspecified systemic antibiotic, initial encounter: Secondary | ICD-10-CM | POA: Diagnosis present

## 2014-10-16 DIAGNOSIS — R55 Syncope and collapse: Secondary | ICD-10-CM | POA: Diagnosis not present

## 2014-10-16 DIAGNOSIS — Z833 Family history of diabetes mellitus: Secondary | ICD-10-CM

## 2014-10-16 DIAGNOSIS — I5032 Chronic diastolic (congestive) heart failure: Secondary | ICD-10-CM | POA: Diagnosis present

## 2014-10-16 DIAGNOSIS — N400 Enlarged prostate without lower urinary tract symptoms: Secondary | ICD-10-CM | POA: Diagnosis present

## 2014-10-16 DIAGNOSIS — R112 Nausea with vomiting, unspecified: Secondary | ICD-10-CM | POA: Diagnosis present

## 2014-10-16 DIAGNOSIS — N183 Chronic kidney disease, stage 3 (moderate): Secondary | ICD-10-CM | POA: Diagnosis present

## 2014-10-16 DIAGNOSIS — Z7982 Long term (current) use of aspirin: Secondary | ICD-10-CM

## 2014-10-16 DIAGNOSIS — E860 Dehydration: Secondary | ICD-10-CM | POA: Diagnosis present

## 2014-10-16 DIAGNOSIS — Z794 Long term (current) use of insulin: Secondary | ICD-10-CM

## 2014-10-16 LAB — CBC
HCT: 39.2 % (ref 39.0–52.0)
HEMATOCRIT: 41.5 % (ref 39.0–52.0)
HEMOGLOBIN: 13.3 g/dL (ref 13.0–17.0)
Hemoglobin: 13.8 g/dL (ref 13.0–17.0)
MCH: 31 pg (ref 26.0–34.0)
MCH: 31.1 pg (ref 26.0–34.0)
MCHC: 33.3 g/dL (ref 30.0–36.0)
MCHC: 33.9 g/dL (ref 30.0–36.0)
MCV: 93.3 fL (ref 78.0–100.0)
MCV: 91.6 fL (ref 78.0–100.0)
PLATELETS: 173 10*3/uL (ref 150–400)
PLATELETS: 177 10*3/uL (ref 150–400)
RBC: 4.45 MIL/uL (ref 4.22–5.81)
RBC: 4.28 MIL/uL (ref 4.22–5.81)
RDW: 13.8 % (ref 11.5–15.5)
RDW: 13.9 % (ref 11.5–15.5)
WBC: 5.8 10*3/uL (ref 4.0–10.5)
WBC: 4.8 10*3/uL (ref 4.0–10.5)

## 2014-10-16 LAB — URINALYSIS, ROUTINE W REFLEX MICROSCOPIC
BILIRUBIN URINE: NEGATIVE
Glucose, UA: NEGATIVE mg/dL
Hgb urine dipstick: NEGATIVE
KETONES UR: NEGATIVE mg/dL
LEUKOCYTES UA: NEGATIVE
NITRITE: NEGATIVE
Protein, ur: NEGATIVE mg/dL
Specific Gravity, Urine: 1.015 (ref 1.005–1.030)
UROBILINOGEN UA: 0.2 mg/dL (ref 0.0–1.0)
pH: 5 (ref 5.0–8.0)

## 2014-10-16 LAB — COMPREHENSIVE METABOLIC PANEL
ALT: 30 U/L (ref 17–63)
AST: 34 U/L (ref 15–41)
Albumin: 4.5 g/dL (ref 3.5–5.0)
Alkaline Phosphatase: 55 U/L (ref 38–126)
Anion gap: 9 (ref 5–15)
BILIRUBIN TOTAL: 0.7 mg/dL (ref 0.3–1.2)
BUN: 31 mg/dL — AB (ref 6–20)
CHLORIDE: 109 mmol/L (ref 101–111)
CO2: 18 mmol/L — ABNORMAL LOW (ref 22–32)
Calcium: 9.3 mg/dL (ref 8.9–10.3)
Creatinine, Ser: 1.66 mg/dL — ABNORMAL HIGH (ref 0.61–1.24)
GFR, EST AFRICAN AMERICAN: 42 mL/min — AB (ref >60)
GFR, EST NON AFRICAN AMERICAN: 36 mL/min — AB (ref >60)
Glucose, Bld: 153 mg/dL — ABNORMAL HIGH (ref 65–99)
POTASSIUM: 5.3 mmol/L — AB (ref 3.5–5.1)
Sodium: 136 mmol/L (ref 135–145)
TOTAL PROTEIN: 8.5 g/dL — AB (ref 6.5–8.1)

## 2014-10-16 LAB — LIPASE, BLOOD: LIPASE: 39 U/L (ref 22–51)

## 2014-10-16 LAB — TSH: TSH: 3.281 u[IU]/mL (ref 0.350–4.500)

## 2014-10-16 LAB — MAGNESIUM
MAGNESIUM: 1.7 mg/dL (ref 1.7–2.4)
Magnesium: 1.8 mg/dL (ref 1.7–2.4)

## 2014-10-16 LAB — PHOSPHORUS: Phosphorus: 2.8 mg/dL (ref 2.5–4.6)

## 2014-10-16 LAB — GLUCOSE, CAPILLARY: GLUCOSE-CAPILLARY: 179 mg/dL — AB (ref 65–99)

## 2014-10-16 LAB — CREATININE, SERUM
Creatinine, Ser: 1.5 mg/dL — ABNORMAL HIGH (ref 0.61–1.24)
GFR calc non Af Amer: 41 mL/min — ABNORMAL LOW (ref >60)
GFR, EST AFRICAN AMERICAN: 48 mL/min — AB (ref >60)

## 2014-10-16 LAB — TROPONIN I: Troponin I: <0.03 ng/mL (ref <0.031)

## 2014-10-16 LAB — I-STAT TROPONIN, ED: TROPONIN I, POC: 0.01 ng/mL (ref 0.00–0.08)

## 2014-10-16 MED ORDER — LEVALBUTEROL HCL 0.63 MG/3ML IN NEBU: 0.6300 mg | INHALATION_SOLUTION | Freq: Four times a day (QID) | RESPIRATORY_TRACT | Status: DC | PRN

## 2014-10-16 MED ORDER — ACETAMINOPHEN 325 MG PO TABS: 650.0000 mg | ORAL_TABLET | Freq: Four times a day (QID) | ORAL | Status: DC | PRN

## 2014-10-16 MED ORDER — DONEPEZIL HCL 5 MG PO TABS
10.0000 mg | ORAL_TABLET | Freq: Every day | ORAL | Status: DC
  Administered 2014-10-16: 10 mg via ORAL
  Filled 2014-10-16: qty 2

## 2014-10-16 MED ORDER — SODIUM CHLORIDE 0.9 % IJ SOLN
3.0000 mL | Freq: Two times a day (BID) | INTRAMUSCULAR | Status: DC
  Administered 2014-10-17: 3 mL via INTRAVENOUS

## 2014-10-16 MED ORDER — HEPARIN SODIUM (PORCINE) 5000 UNIT/ML IJ SOLN
5000.0000 [IU] | Freq: Three times a day (TID) | INTRAMUSCULAR | Status: DC
  Administered 2014-10-16 – 2014-10-17 (×2): 5000 [IU] via SUBCUTANEOUS
  Filled 2014-10-16 (×6): qty 1

## 2014-10-16 MED ORDER — ROSUVASTATIN CALCIUM 5 MG PO TABS
5.0000 mg | ORAL_TABLET | Freq: Every day | ORAL | Status: DC
  Administered 2014-10-16: 5 mg via ORAL
  Filled 2014-10-16: qty 1

## 2014-10-16 MED ORDER — SERTRALINE HCL 100 MG PO TABS
100.0000 mg | ORAL_TABLET | Freq: Every day | ORAL | Status: DC
  Administered 2014-10-16: 100 mg via ORAL
  Filled 2014-10-16: qty 1

## 2014-10-16 MED ORDER — SODIUM CHLORIDE 0.9 % IV SOLN
INTRAVENOUS | Status: DC
  Administered 2014-10-16 – 2014-10-17 (×2): via INTRAVENOUS

## 2014-10-16 MED ORDER — ONDANSETRON HCL 4 MG PO TABS
4.0000 mg | ORAL_TABLET | Freq: Four times a day (QID) | ORAL | Status: DC | PRN
Start: 2014-10-16 — End: 2014-10-17

## 2014-10-16 MED ORDER — MEMANTINE HCL 10 MG PO TABS
10.0000 mg | ORAL_TABLET | Freq: Every day | ORAL | Status: DC
  Administered 2014-10-16: 10 mg via ORAL
  Filled 2014-10-16: qty 1

## 2014-10-16 MED ORDER — MIRTAZAPINE 15 MG PO TABS
7.5000 mg | ORAL_TABLET | Freq: Every day | ORAL | Status: DC
  Administered 2014-10-16: 7.5 mg via ORAL
  Filled 2014-10-16: qty 1

## 2014-10-16 MED ORDER — FINASTERIDE 5 MG PO TABS
5.0000 mg | ORAL_TABLET | Freq: Every day | ORAL | Status: DC
  Administered 2014-10-16 – 2014-10-17 (×2): 5 mg via ORAL
  Filled 2014-10-16 (×2): qty 1

## 2014-10-16 MED ORDER — INSULIN ASPART 100 UNIT/ML ~~LOC~~ SOLN
0.0000 [IU] | SUBCUTANEOUS | Status: DC
  Administered 2014-10-16: 2 [IU] via SUBCUTANEOUS

## 2014-10-16 MED ORDER — INSULIN GLARGINE 100 UNIT/ML ~~LOC~~ SOLN
55.0000 [IU] | Freq: Every day | SUBCUTANEOUS | Status: DC
  Administered 2014-10-16: 55 [IU] via SUBCUTANEOUS
  Filled 2014-10-16 (×2): qty 0.55

## 2014-10-16 MED ORDER — ASPIRIN EC 81 MG PO TBEC
81.0000 mg | DELAYED_RELEASE_TABLET | Freq: Every day | ORAL | Status: DC
  Administered 2014-10-16: 81 mg via ORAL
  Filled 2014-10-16: qty 1

## 2014-10-16 MED ORDER — ONDANSETRON HCL 4 MG/2ML IJ SOLN: 4.0000 mg | Freq: Four times a day (QID) | INTRAMUSCULAR | Status: DC | PRN

## 2014-10-16 MED ORDER — ACETAMINOPHEN 650 MG RE SUPP: 650.0000 mg | Freq: Four times a day (QID) | RECTAL | Status: DC | PRN

## 2014-10-16 NOTE — ED Notes (Signed)
Pt c/o 5 episodes of emesis in the last hour, denies blood in same. Denies pain, diarrhea, fever/chills.

## 2014-10-16 NOTE — ED Notes (Signed)
Pt is aware of urine sample 

## 2014-10-16 NOTE — ED Notes (Signed)
18                    Pt can go to floor at 18:57         ;57

## 2014-10-16 NOTE — ED Provider Notes (Signed)
CSN: [id number]     Arrival date & time 10/16/14  1137 History   First MD Initiated Contact with Patient 10/16/14 1221     Chief Complaint  Patient presents with  . Emesis     (Consider location/radiation/quality/duration/timing/severity/associated sxs/prior Treatment) HPI  81 year old male who presents with nausea, vomiting, and difficulty breathing.  History of dementia, complete heart block with pacemaker placements, HTN, HLD, and DM.  His caregiver reports that this morning, while watching TV patient looked suddenly unwell. Began breathing heavily, appeared pale, and had subsequent vomiting 5. He became very diaphoretic. Patient with history of dementia and does not recall incident. Currently denies chest pain, SOB, abdominal pain or distension, diarrhea, constipation. He has not had fevers or chills. Denies associating headache, confusion, or neurological complaints. Diagnosed with UTI one week ago, and was on two antibiotics. He finished one course and is on the last day of the other.    Past Medical History  Diagnosis Date  . Type II or unspecified type diabetes mellitus without mention of complication, uncontrolled   . Essential hypertension, benign   . Mental disorder   . Dementia   . Peripheral neuropathy   . Allergic rhinitis   . Asthma with bronchitis   . Complete heart block     Requiring permanent St. Jude DDD pacemaker by Dr. Ladona Ridgel  . Peripheral vascular disease, unspecified   . Cardiac pacemaker in situ     Normal function  . History of echocardiogram 01/2012    EF 60-65%   . Mixed hyperlipidemia    Past Surgical History  Procedure Laterality Date  . Rotator cuff repair    . Pacemaker insertion  Dec 2013  . Permanent pacemaker insertion N/A 01/31/2012    Procedure: PERMANENT PACEMAKER INSERTION;  Surgeon: Marinus Maw, MD;  Location: [facility] CATH LAB;  Service: Cardiovascular;  Laterality: N/A;   Family History  Problem Relation Age of Onset  . Heart disease  Mother   . Alzheimer's disease Father   . Diabetes Sister   . Alzheimer's disease Brother   . COPD Brother    Social History  Substance Use Topics  . Smoking status: Former Smoker    Types: Cigarettes  . Smokeless tobacco: Never Used  . Alcohol Use: No    Review of Systems 10/14 systems reviewed and are negative other than those stated in the HPI    Allergies  Actos and Ramipril  Home Medications   Prior to Admission medications   Medication Sig Start Date End Date Taking? Authorizing Provider  amLODipine (NORVASC) 10 MG tablet Take 1 tablet (10 mg total) by mouth at bedtime. 02/19/13  Yes Elenora Gamma, MD  aspirin EC 81 MG tablet Take 81 mg by mouth at bedtime.   Yes Historical Provider, MD  cefpodoxime (VANTIN) 200 MG tablet Take 200 mg by mouth at bedtime.   Yes Historical Provider, MD  donepezil (ARICEPT) 10 MG tablet Take 1 tablet (10 mg total) by mouth at bedtime. 02/19/13  Yes Elenora Gamma, MD  finasteride (PROSCAR) 5 MG tablet TAKE 1 TABLET BY MOUTH EVERY DAY   Yes Elenora Gamma, MD  glucose blood test strip Once daily 04/16/13  Yes Elenora Gamma, MD  insulin glargine (LANTUS) 100 UNIT/ML injection Inject 70 Units into the skin at bedtime. 02/02/12  Yes Lyn Records, MD  memantine (NAMENDA) 10 MG tablet Take 1 tablet (10 mg total) by mouth at bedtime. 02/19/13  Yes Samuel L  Ermalinda Memos, MD  mirtazapine (REMERON) 15 MG tablet Take 7.5 mg by mouth at bedtime.   Yes Historical Provider, MD  [facility] DELICA LANCETS 33G MISC 1 Device by Does not apply route 4 (four) times daily as needed. 02/19/13  Yes Elenora Gamma, MD  rosuvastatin (CRESTOR) 5 MG tablet Take 1 tablet (5 mg total) by mouth at bedtime. 02/19/13  Yes Elenora Gamma, MD  saw palmetto 500 MG capsule Take 450 mg by mouth daily.   Yes Historical Provider, MD  sertraline (ZOLOFT) 100 MG tablet Take 1 tablet (100 mg total) by mouth at bedtime. 02/19/13  Yes Elenora Gamma, MD  phenazopyridine  (PYRIDIUM) 100 MG tablet Take 100 mg by mouth 3 (three) times daily.    Historical Provider, MD   BP 147/63 mmHg  Pulse 72  Temp(Src) 97.9 F (36.6 C) (Oral)  Resp 15  Ht 5\' 10"  (1.778 m)  Wt 166 lb 4.8 oz (75.433 kg)  BMI 23.86 kg/m2  SpO2 100% Physical Exam Physical Exam  Nursing note and vitals reviewed. Constitutional: Elderly man, appearing well developed, well nourished, non-toxic, and in no acute distress Head: Normocephalic and atraumatic.  Mouth/Throat: Oropharynx is clear and moist.  Neck: Normal range of motion. Neck supple.  Cardiovascular: Normal rate and regular rhythm.  No edema.  Pulmonary/Chest: Effort normal and breath sounds normal.  Abdominal: Soft. There is no tenderness. No distension. There is no rebound and no guarding.  Musculoskeletal: Normal range of motion.  Neurological: Alert, no facial droop, fluent speech, moves all extremities symmetrically Skin: Skin is warm and dry.  Psychiatric: Cooperative  ED Course  Procedures (including critical care time) Labs Review Labs Reviewed  COMPREHENSIVE METABOLIC PANEL - Abnormal; Notable for the following:    Potassium 5.3 (*)    CO2 18 (*)    Glucose, Bld 153 (*)    BUN 31 (*)    Creatinine, Ser 1.66 (*)    Total Protein 8.5 (*)    GFR calc non Af Amer 36 (*)    GFR calc Af Amer 42 (*)    All other components within normal limits  URINALYSIS, ROUTINE W REFLEX MICROSCOPIC (NOT AT [facility]) - Abnormal; Notable for the following:    APPearance CLOUDY (*)    All other components within normal limits  CREATININE, SERUM - Abnormal; Notable for the following:    Creatinine, Ser 1.50 (*)    GFR calc non Af Amer 41 (*)    GFR calc Af Amer 48 (*)    All other components within normal limits  LIPASE, BLOOD  CBC  MAGNESIUM  PHOSPHORUS  CBC  MAGNESIUM  TSH  TROPONIN I  TROPONIN I  TROPONIN I  COMPREHENSIVE METABOLIC PANEL  CBC  I-STAT TROPOININ, ED  Rosezena Sensor, ED    Imaging Review Dg Chest 2  View  10/16/2014   CLINICAL DATA:  Nausea, vomiting and difficulty breathing for 3 days.  EXAM: CHEST - 2 VIEW; ABDOMEN - 2 VIEW  COMPARISON:  02/01/2012 chest x-ray.  FINDINGS: Chest x-ray:  The cardiac silhouette, mediastinal and hilar contours are normal. The pacer wires are stable. The lungs are clear. Stable eventration of the right hemidiaphragm.  Abdomen:  The bowel gas pattern is unremarkable. There is scattered air and stool throughout the colon and down into the rectum. There are some scattered air-filled small bowel loops but no distention or air-fluid levels. The soft tissue shadows are maintained. No worrisome calcifications. The bony structures are intact.  IMPRESSION: 1. No acute cardiopulmonary findings. 2. No plain film findings for an acute abdominal process.   Electronically Signed   By: Rudie Meyer M.D.   On: 10/16/2014 15:23   Dg Abd 2 Views  10/16/2014   CLINICAL DATA:  Nausea, vomiting and difficulty breathing for 3 days.  EXAM: CHEST - 2 VIEW; ABDOMEN - 2 VIEW  COMPARISON:  02/01/2012 chest x-ray.  FINDINGS: Chest x-ray:  The cardiac silhouette, mediastinal and hilar contours are normal. The pacer wires are stable. The lungs are clear. Stable eventration of the right hemidiaphragm.  Abdomen:  The bowel gas pattern is unremarkable. There is scattered air and stool throughout the colon and down into the rectum. There are some scattered air-filled small bowel loops but no distention or air-fluid levels. The soft tissue shadows are maintained. No worrisome calcifications. The bony structures are intact.  IMPRESSION: 1. No acute cardiopulmonary findings. 2. No plain film findings for an acute abdominal process.   Electronically Signed   By: Rudie Meyer M.D.   On: 10/16/2014 15:23   I have personally reviewed and evaluated these images and lab results as part of my medical decision-making.   EKG Interpretation   Date/Time:  Wednesday October 16 2014 12:39:54 EDT Ventricular Rate:   68 PR Interval:  220 QRS Duration: 152 QT Interval:  450 QTC Calculation: 479 R Axis:   -79 Text Interpretation:  Ventricular-paced complexes Confirmed by Karmel Patricelli MD,  Annabelle Harman ([id number]) on 10/16/2014 2:34:04 PM      MDM   Final diagnoses:  Nausea & vomiting    In short, this is an 81 year old male with history of dementia, DM, HTN, HLD and pacemaker for complete heart block who presents with nausea, vomiting, diaphoresis. He is asymptomatic on arrival and poor historian. VS unremarkable and exam non-focal.   No headaches and no neuro complaints or deficits and does not seem neurological in nature. Abdomen benign on exam and unremarkable CBC, CMP, lipase, UA. XR unremarkable and patient does not clinically appear obstructed. Given age and DM, may be atypical ACS presentation. EKg showing paced rhythm without ischemia. Troponin x 1 negative. Unclear of etiology of symptoms at this time, and may be benign.  However, given risk factors will admit to observation for ACS rule out.     Lavera Guise, MD 10/16/14 2007

## 2014-10-16 NOTE — ED Notes (Signed)
Unable to provide urine specimen at this time.  

## 2014-10-16 NOTE — H&P (Addendum)
[facility] History and Physical  RODRIQUES BADIE RUE:[id number] DOB: 1928-10-14 DOA: 10/16/2014  Referring physician: ER  PCP: Mickie Hillier, MD   Chief Complaint: Near syncope   HPI:  81 year old male presents with history of diabetes, hypertension, dementia, history of complete heart block with a pacemaker. presents with recurrent nausea vomiting episodes, at least 5 episodes that started this morning, witnessed by the patient's caregiver. Patient has dementia at baseline and is unable to provide any history. Caregiver confirms that the patient has not had any chest pain or shortness of breath. She   found the patient to be cold and clammy during his episodes of emesis, he became very diaphoretic. She was concerned about aspiration and brought him to the ER. No evidence of slurred speech, unilateral weakness or seizure-like activity.      Review of Systems: negative for the following  Constitutional: Denies fever, chills, diaphoresis, appetite change and fatigue.  HEENT: Denies photophobia, eye pain, redness, hearing loss, ear pain, congestion, sore throat, rhinorrhea, sneezing, mouth sores, trouble swallowing, neck pain, neck stiffness and tinnitus.  Respiratory: Positive for diaphoresis, denies SOB, DOE, cough, chest tightness, and wheezing.  Cardiovascular: Denies chest pain, palpitations and leg swelling.  Gastrointestinal: Positive for nausea, vomiting, abdominal pain, diarrhea, constipation, blood in stool and abdominal distention.  Genitourinary: Denies dysuria, urgency, frequency, hematuria, flank pain and difficulty urinating.  Musculoskeletal: Denies myalgias, back pain, joint swelling, arthralgias and gait problem.  Skin: Denies pallor, rash and wound.  Neurological: Denies dizziness, seizures, syncope, weakness, light-headedness, numbness and headaches.  Hematological: Denies adenopathy. Easy bruising, personal or family bleeding history  Psychiatric/Behavioral: Denies  suicidal ideation, mood changes, confusion, nervousness, sleep disturbance and agitation       Past Medical History  Diagnosis Date  . Type II or unspecified type diabetes mellitus without mention of complication, uncontrolled   . Essential hypertension, benign   . Mental disorder   . Dementia   . Peripheral neuropathy   . Allergic rhinitis   . Asthma with bronchitis   . Complete heart block     Requiring permanent St. Jude DDD pacemaker by Dr. Ladona Ridgel  . Peripheral vascular disease, unspecified   . Cardiac pacemaker in situ     Normal function  . History of echocardiogram 01/2012    EF 60-65%   . Mixed hyperlipidemia      Past Surgical History  Procedure Laterality Date  . Rotator cuff repair    . Pacemaker insertion  Dec 2013  . Permanent pacemaker insertion N/A 01/31/2012    Procedure: PERMANENT PACEMAKER INSERTION;  Surgeon: Marinus Maw, MD;  Location: [facility] CATH LAB;  Service: Cardiovascular;  Laterality: N/A;      Social History:  reports that he has quit smoking. His smoking use included Cigarettes. He has never used smokeless tobacco. He reports that he does not drink alcohol or use illicit drugs.    Allergies  Allergen Reactions  . Actos [Pioglitazone]     "makes me feel lousy and makes me bleed"  . Ramipril     "pt not aware of allergy"    Family History  Problem Relation Age of Onset  . Heart disease Mother   . Alzheimer's disease Father   . Diabetes Sister   . Alzheimer's disease Brother   . COPD Brother         Prior to Admission medications   Medication Sig Start Date End Date Taking? Authorizing Provider  amLODipine (NORVASC) 10 MG tablet Take 1 tablet (  10 mg total) by mouth at bedtime. 02/19/13  Yes Elenora Gamma, MD  aspirin EC 81 MG tablet Take 81 mg by mouth at bedtime.   Yes Historical Provider, MD  cefpodoxime (VANTIN) 200 MG tablet Take 200 mg by mouth at bedtime.   Yes Historical Provider, MD  donepezil (ARICEPT) 10 MG tablet  Take 1 tablet (10 mg total) by mouth at bedtime. 02/19/13  Yes Elenora Gamma, MD  finasteride (PROSCAR) 5 MG tablet TAKE 1 TABLET BY MOUTH EVERY DAY   Yes Elenora Gamma, MD  glucose blood test strip Once daily 04/16/13  Yes Elenora Gamma, MD  insulin glargine (LANTUS) 100 UNIT/ML injection Inject 70 Units into the skin at bedtime. 02/02/12  Yes Lyn Records, MD  memantine (NAMENDA) 10 MG tablet Take 1 tablet (10 mg total) by mouth at bedtime. 02/19/13  Yes Elenora Gamma, MD  mirtazapine (REMERON) 15 MG tablet Take 7.5 mg by mouth at bedtime.   Yes Historical Provider, MD  [facility] DELICA LANCETS 33G MISC 1 Device by Does not apply route 4 (four) times daily as needed. 02/19/13  Yes Elenora Gamma, MD  rosuvastatin (CRESTOR) 5 MG tablet Take 1 tablet (5 mg total) by mouth at bedtime. 02/19/13  Yes Elenora Gamma, MD  saw palmetto 500 MG capsule Take 450 mg by mouth daily.   Yes Historical Provider, MD  sertraline (ZOLOFT) 100 MG tablet Take 1 tablet (100 mg total) by mouth at bedtime. 02/19/13  Yes Elenora Gamma, MD  phenazopyridine (PYRIDIUM) 100 MG tablet Take 100 mg by mouth 3 (three) times daily.    Historical Provider, MD     Physical Exam: Filed Vitals:   10/16/14 1151 10/16/14 1450 10/16/14 1704  BP: 137/65 101/61 135/85  Pulse: 73 70 75  Temp: 97.7 F (36.5 C) 98.2 F (36.8 C) 98.1 F (36.7 C)  TempSrc: Oral Oral Oral  Resp: 16 13 14   SpO2: 100% 100% 100%     Constitutional: Vital signs reviewed. Patient is  in no acute distress and cooperative with exam. Alert and oriented to self, pleasantly confused Head: Normocephalic and atraumatic  Ear: TM normal bilaterally  Mouth: no erythema or exudates, dry oral mucosa Eyes: PERRL, EOMI, conjunctivae normal, No scleral icterus.  Neck: Supple, Trachea midline normal ROM, No JVD, mass, thyromegaly, or carotid bruit present.  Cardiovascular: RRR, S1 normal, S2 normal, no MRG, pulses symmetric and intact bilaterally   Pulmonary/Chest: CTAB, no wheezes, rales, or rhonchi  Abdominal: Soft. Non-tender, non-distended, bowel sounds are normal, no masses, organomegaly, or guarding present.  GU: no CVA tenderness Musculoskeletal: No joint deformities, erythema, or stiffness, ROM full and no nontender Ext: no edema and no cyanosis, pulses palpable bilaterally (DP and PT)  Hematology: no cervical, inginal, or axillary adenopathy.  Neurological: A&O x1, Strenght is normal and symmetric bilaterally, cranial nerve II-XII are grossly intact, no focal motor deficit, sensory intact to light touch bilaterally.  Skin: Warm, dry and intact. No rash, cyanosis, or clubbing.  Psychiatric: Normal mood and affect. speech and behavior is normal. Able to recognize his caregiver   Data Review   Micro Results No results found for this or any previous visit (from the past 240 hour(s)).  Radiology Reports Dg Chest 2 View  10/16/2014   CLINICAL DATA:  Nausea, vomiting and difficulty breathing for 3 days.  EXAM: CHEST - 2 VIEW; ABDOMEN - 2 VIEW  COMPARISON:  02/01/2012 chest x-ray.  FINDINGS: Chest x-ray:  The cardiac silhouette, mediastinal and hilar contours are normal. The pacer wires are stable. The lungs are clear. Stable eventration of the right hemidiaphragm.  Abdomen:  The bowel gas pattern is unremarkable. There is scattered air and stool throughout the colon and down into the rectum. There are some scattered air-filled small bowel loops but no distention or air-fluid levels. The soft tissue shadows are maintained. No worrisome calcifications. The bony structures are intact.  IMPRESSION: 1. No acute cardiopulmonary findings. 2. No plain film findings for an acute abdominal process.   Electronically Signed   By: Rudie Meyer M.D.   On: 10/16/2014 15:23   Dg Abd 2 Views  10/16/2014   CLINICAL DATA:  Nausea, vomiting and difficulty breathing for 3 days.  EXAM: CHEST - 2 VIEW; ABDOMEN - 2 VIEW  COMPARISON:  02/01/2012 chest x-ray.   FINDINGS: Chest x-ray:  The cardiac silhouette, mediastinal and hilar contours are normal. The pacer wires are stable. The lungs are clear. Stable eventration of the right hemidiaphragm.  Abdomen:  The bowel gas pattern is unremarkable. There is scattered air and stool throughout the colon and down into the rectum. There are some scattered air-filled small bowel loops but no distention or air-fluid levels. The soft tissue shadows are maintained. No worrisome calcifications. The bony structures are intact.  IMPRESSION: 1. No acute cardiopulmonary findings. 2. No plain film findings for an acute abdominal process.   Electronically Signed   By: Rudie Meyer M.D.   On: 10/16/2014 15:23     CBC  Recent Labs Lab 10/16/14 1214  WBC 5.8  HGB 13.8  HCT 41.5  PLT 173  MCV 93.3  MCH 31.0  MCHC 33.3  RDW 13.8    Chemistries   Recent Labs Lab 10/16/14 1214 10/16/14 1228  NA 136  --   K 5.3*  --   CL 109  --   CO2 18*  --   GLUCOSE 153*  --   BUN 31*  --   CREATININE 1.66*  --   CALCIUM 9.3  --   MG  --  1.8  AST 34  --   ALT 30  --   ALKPHOS 55  --   BILITOT 0.7  --    ------------------------------------------------------------------------------------------------------------------ CrCl cannot be calculated (Unknown ideal weight.). ------------------------------------------------------------------------------------------------------------------ No results for input(s): HGBA1C in the last 72 hours. ------------------------------------------------------------------------------------------------------------------ No results for input(s): CHOL, HDL, LDLCALC, TRIG, CHOLHDL, LDLDIRECT in the last 72 hours. ------------------------------------------------------------------------------------------------------------------ No results for input(s): TSH, T4TOTAL, T3FREE, THYROIDAB in the last 72 hours.  Invalid input(s):  FREET3 ------------------------------------------------------------------------------------------------------------------ No results for input(s): VITAMINB12, FOLATE, FERRITIN, TIBC, IRON, RETICCTPCT in the last 72 hours.  Coagulation profile No results for input(s): INR, PROTIME in the last 168 hours.  No results for input(s): DDIMER in the last 72 hours.  Cardiac Enzymes No results for input(s): CKMB, TROPONINI, MYOGLOBIN in the last 168 hours.  Invalid input(s): CK ------------------------------------------------------------------------------------------------------------------ Invalid input(s): POCBNP   CBG: No results for input(s): GLUCAP in the last 168 hours.     EKG: Independently reviewed.   Assessment/Plan Principal Problem:   Nausea & vomiting-likely a side effect of antibiotics which  he has been taking for UTI. Patient on Pyridium and Vantin. According to caregiver he has completed antibiotics. KUB negative. UA negative, no associated diarrhea or abdominal pain. No evidence of viral gastroenteritis. No history of gastroparesis. No history of constipation either  Near syncope-check orthostatics, could be dehydrated secondary to nausea vomiting this morning versus vasovagal syncope,  start the patient on IV fluids, patient has a pacemaker, had a recent device check on 05/13/14. Will admit to telemetry, cycle cardiac enzymes, 2-D echo to rule out cardiac etiology, no focal neurologic deficits therefore no neuroimaging required     Third degree heart block-patient has a pacemaker in place , followed by Dr. Verdis Prime    Chronic diastolic heart failure-repeat 2-D echo    Dementia-has 24-hour caregiver at home, continue Remeron, Namenda, Aricept    DM (diabetes mellitus), type 2, uncontrolled-start the patient on sliding scale insulin, continue Lantus and start Accu-Cheks  Chronic kidney disease stage III creatinine stable and at baseline  BPH-continue  Proscar   Benign hypertension, controlled, hold Norvasc until tomorrow and restart when the patient's blood pressure is more stable  Code Status:   full Family Communication: bedside Disposition Plan: admit   Total time spent 55 minutes.Greater than 50% of this time was spent in counseling, explanation of diagnosis, planning of further management, and coordination of care  [facility] Pager [phone]  If 7PM-7AM, please contact night-coverage [email or web address] Password [facility] 10/16/2014, 6:15 PM

## 2014-10-16 NOTE — ED Notes (Signed)
Patient transported to X-ray 

## 2014-10-17 ENCOUNTER — Observation Stay (HOSPITAL_COMMUNITY): Payer: Medicare Other

## 2014-10-17 ENCOUNTER — Observation Stay (HOSPITAL_BASED_OUTPATIENT_CLINIC_OR_DEPARTMENT_OTHER): Payer: Medicare Other

## 2014-10-17 DIAGNOSIS — R55 Syncope and collapse: Secondary | ICD-10-CM | POA: Diagnosis not present

## 2014-10-17 DIAGNOSIS — R0609 Other forms of dyspnea: Secondary | ICD-10-CM | POA: Diagnosis not present

## 2014-10-17 LAB — CBC
HCT: 34.5 % — ABNORMAL LOW (ref 39.0–52.0)
Hemoglobin: 11.9 g/dL — ABNORMAL LOW (ref 13.0–17.0)
MCH: 32 pg (ref 26.0–34.0)
MCHC: 34.5 g/dL (ref 30.0–36.0)
MCV: 92.7 fL (ref 78.0–100.0)
PLATELETS: 165 10*3/uL (ref 150–400)
RBC: 3.72 MIL/uL — ABNORMAL LOW (ref 4.22–5.81)
RDW: 13.8 % (ref 11.5–15.5)
WBC: 5.1 10*3/uL (ref 4.0–10.5)

## 2014-10-17 LAB — COMPREHENSIVE METABOLIC PANEL
ALK PHOS: 45 U/L (ref 38–126)
ALT: 24 U/L (ref 17–63)
AST: 24 U/L (ref 15–41)
Albumin: 3.6 g/dL (ref 3.5–5.0)
Anion gap: 7 (ref 5–15)
BILIRUBIN TOTAL: 0.4 mg/dL (ref 0.3–1.2)
BUN: 29 mg/dL — AB (ref 6–20)
CALCIUM: 8.6 mg/dL — AB (ref 8.9–10.3)
CO2: 18 mmol/L — ABNORMAL LOW (ref 22–32)
CREATININE: 1.39 mg/dL — AB (ref 0.61–1.24)
Chloride: 112 mmol/L — ABNORMAL HIGH (ref 101–111)
GFR, EST AFRICAN AMERICAN: 52 mL/min — AB (ref >60)
GFR, EST NON AFRICAN AMERICAN: 45 mL/min — AB (ref >60)
Glucose, Bld: 68 mg/dL (ref 65–99)
Potassium: 4.2 mmol/L (ref 3.5–5.1)
Sodium: 137 mmol/L (ref 135–145)
TOTAL PROTEIN: 6.8 g/dL (ref 6.5–8.1)

## 2014-10-17 LAB — TROPONIN I
Troponin I: <0.03 ng/mL (ref <0.031)
Troponin I: <0.03 ng/mL (ref <0.031)

## 2014-10-17 LAB — GLUCOSE, CAPILLARY
Glucose-Capillary: 95 mg/dL (ref 65–99)
Glucose-Capillary: 86 mg/dL (ref 65–99)
Glucose-Capillary: 112 mg/dL — ABNORMAL HIGH (ref 65–99)

## 2014-10-17 NOTE — Progress Notes (Signed)
*  PRELIMINARY RESULTS* Vascular Ultrasound Carotid Duplex (Doppler) has been completed.  Preliminary findings: Bilateral: No significant (1-39%) ICA stenosis. Antegrade vertebral flow.     Farrel Demark, RDMS, RVT  10/17/2014, 9:21 AM

## 2014-10-17 NOTE — Discharge Summary (Signed)
Physician Discharge Summary  Dakota White MRN: [id number] DOB/AGE: [birth year] 81 y.o.  PCP: Georges Lynch, MD   Admit date: 10/16/2014 Discharge date: 10/17/2014  Discharge Diagnoses:   Principal Problem:   Nausea & vomiting Active Problems:   Third degree heart block   Chronic diastolic heart failure   Dementia   DM (diabetes mellitus), type 2, uncontrolled    Follow-up recommendations Follow-up with PCP in 3-5 days , including all  additional recommended appointments as below Follow-up CBC, CMP in 3-5 days      Medication List    STOP taking these medications        cefpodoxime 200 MG tablet  Commonly known as:  VANTIN     phenazopyridine 100 MG tablet  Commonly known as:  PYRIDIUM     saw palmetto 500 MG capsule      TAKE these medications        amLODipine 10 MG tablet  Commonly known as:  NORVASC  Take 1 tablet (10 mg total) by mouth at bedtime.     aspirin EC 81 MG tablet  Take 81 mg by mouth at bedtime.     donepezil 10 MG tablet  Commonly known as:  ARICEPT  Take 1 tablet (10 mg total) by mouth at bedtime.     finasteride 5 MG tablet  Commonly known as:  PROSCAR  TAKE 1 TABLET BY MOUTH EVERY DAY     glucose blood test strip  Once daily     insulin glargine 100 UNIT/ML injection  Commonly known as:  LANTUS  Inject 70 Units into the skin at bedtime.     memantine 10 MG tablet  Commonly known as:  NAMENDA  Take 1 tablet (10 mg total) by mouth at bedtime.     mirtazapine 15 MG tablet  Commonly known as:  REMERON  Take 7.5 mg by mouth at bedtime.     ONETOUCH DELICA LANCETS 70Y Misc  1 Device by Does not apply route 4 (four) times daily as needed.     rosuvastatin 5 MG tablet  Commonly known as:  CRESTOR  Take 1 tablet (5 mg total) by mouth at bedtime.     sertraline 100 MG tablet  Commonly known as:  ZOLOFT  Take 1 tablet (100 mg total) by mouth at bedtime.         Discharge Condition: Stable    Disposition: 06-Home-Health  Care Svc   Consults:  None  Significant Diagnostic Studies:  Dg Chest 2 View  10/16/2014   CLINICAL DATA:  Nausea, vomiting and difficulty breathing for 3 days.  EXAM: CHEST - 2 VIEW; ABDOMEN - 2 VIEW  COMPARISON:  02/01/2012 chest x-ray.  FINDINGS: Chest x-ray:  The cardiac silhouette, mediastinal and hilar contours are normal. The pacer wires are stable. The lungs are clear. Stable eventration of the right hemidiaphragm.  Abdomen:  The bowel gas pattern is unremarkable. There is scattered air and stool throughout the colon and down into the rectum. There are some scattered air-filled small bowel loops but no distention or air-fluid levels. The soft tissue shadows are maintained. No worrisome calcifications. The bony structures are intact.  IMPRESSION: 1. No acute cardiopulmonary findings. 2. No plain film findings for an acute abdominal process.   Electronically Signed   By: Marijo Sanes M.D.   On: 10/16/2014 15:23   Dg Abd 2 Views  10/16/2014   CLINICAL DATA:  Nausea, vomiting and difficulty breathing for 3 days.  EXAM: CHEST -  2 VIEW; ABDOMEN - 2 VIEW  COMPARISON:  02/01/2012 chest x-ray.  FINDINGS: Chest x-ray:  The cardiac silhouette, mediastinal and hilar contours are normal. The pacer wires are stable. The lungs are clear. Stable eventration of the right hemidiaphragm.  Abdomen:  The bowel gas pattern is unremarkable. There is scattered air and stool throughout the colon and down into the rectum. There are some scattered air-filled small bowel loops but no distention or air-fluid levels. The soft tissue shadows are maintained. No worrisome calcifications. The bony structures are intact.  IMPRESSION: 1. No acute cardiopulmonary findings. 2. No plain film findings for an acute abdominal process.   Electronically Signed   By: Marijo Sanes M.D.   On: 10/16/2014 15:23      Filed Weights   10/16/14 1939  Weight: 75.433 kg (166 lb 4.8 oz)     Microbiology: No results found for this or any  previous visit (from the past 240 hour(s)).     Blood Culture No results found for: SDES, [place], CULT, REPTSTATUS    Labs: Results for orders placed or performed during the hospital encounter of 10/16/14 (from the past 48 hour(s))  Lipase, blood     Status: None   Collection Time: 10/16/14 12:14 PM  Result Value Ref Range   Lipase 39 22 - 51 U/L  Comprehensive metabolic panel     Status: Abnormal   Collection Time: 10/16/14 12:14 PM  Result Value Ref Range   Sodium 136 135 - 145 mmol/L   Potassium 5.3 (H) 3.5 - 5.1 mmol/L   Chloride 109 101 - 111 mmol/L   CO2 18 (L) 22 - 32 mmol/L   Glucose, Bld 153 (H) 65 - 99 mg/dL   BUN 31 (H) 6 - 20 mg/dL   Creatinine, Ser 1.66 (H) 0.61 - 1.24 mg/dL   Calcium 9.3 8.9 - 10.3 mg/dL   Total Protein 8.5 (H) 6.5 - 8.1 g/dL   Albumin 4.5 3.5 - 5.0 g/dL   AST 34 15 - 41 U/L   ALT 30 17 - 63 U/L   Alkaline Phosphatase 55 38 - 126 U/L   Total Bilirubin 0.7 0.3 - 1.2 mg/dL   GFR calc non Af Amer 36 (L) >60 mL/min   GFR calc Af Amer 42 (L) >60 mL/min    Comment: (NOTE) The eGFR has been calculated using the CKD EPI equation. This calculation has not been validated in all clinical situations. eGFR's persistently <60 mL/min signify possible Chronic Kidney Disease.    Anion gap 9 5 - 15  CBC     Status: None   Collection Time: 10/16/14 12:14 PM  Result Value Ref Range   WBC 5.8 4.0 - 10.5 K/uL   RBC 4.45 4.22 - 5.81 MIL/uL   Hemoglobin 13.8 13.0 - 17.0 g/dL   HCT 41.5 39.0 - 52.0 %   MCV 93.3 78.0 - 100.0 fL   MCH 31.0 26.0 - 34.0 pg   MCHC 33.3 30.0 - 36.0 g/dL   RDW 13.8 11.5 - 15.5 %   Platelets 173 150 - 400 K/uL  Magnesium     Status: None   Collection Time: 10/16/14 12:28 PM  Result Value Ref Range   Magnesium 1.8 1.7 - 2.4 mg/dL  Phosphorus     Status: None   Collection Time: 10/16/14 12:28 PM  Result Value Ref Range   Phosphorus 2.8 2.5 - 4.6 mg/dL  I-Stat Troponin, ED (not at [facility])     Status: None  Collection Time:  10/16/14  2:20 PM  Result Value Ref Range   Troponin i, poc 0.01 0.00 - 0.08 ng/mL   Comment 3            Comment: Due to the release kinetics of cTnI, a negative result within the first hours of the onset of symptoms does not rule out myocardial infarction with certainty. If myocardial infarction is still suspected, repeat the test at appropriate intervals.   Urinalysis, Routine w reflex microscopic (not at [facility])     Status: Abnormal   Collection Time: 10/16/14  2:35 PM  Result Value Ref Range   Color, Urine YELLOW YELLOW   APPearance CLOUDY (A) CLEAR   Specific Gravity, Urine 1.015 1.005 - 1.030   pH 5.0 5.0 - 8.0   Glucose, UA NEGATIVE NEGATIVE mg/dL   Hgb urine dipstick NEGATIVE NEGATIVE   Bilirubin Urine NEGATIVE NEGATIVE   Ketones, ur NEGATIVE NEGATIVE mg/dL   Protein, ur NEGATIVE NEGATIVE mg/dL   Urobilinogen, UA 0.2 0.0 - 1.0 mg/dL   Nitrite NEGATIVE NEGATIVE   Leukocytes, UA NEGATIVE NEGATIVE    Comment: MICROSCOPIC NOT DONE ON URINES WITH NEGATIVE PROTEIN, BLOOD, LEUKOCYTES, NITRITE, OR GLUCOSE <1000 mg/dL.  CBC     Status: None   Collection Time: 10/16/14  6:08 PM  Result Value Ref Range   WBC 4.8 4.0 - 10.5 K/uL   RBC 4.28 4.22 - 5.81 MIL/uL   Hemoglobin 13.3 13.0 - 17.0 g/dL   HCT 39.2 39.0 - 52.0 %   MCV 91.6 78.0 - 100.0 fL   MCH 31.1 26.0 - 34.0 pg   MCHC 33.9 30.0 - 36.0 g/dL   RDW 13.9 11.5 - 15.5 %   Platelets 177 150 - 400 K/uL  Creatinine, serum     Status: Abnormal   Collection Time: 10/16/14  6:08 PM  Result Value Ref Range   Creatinine, Ser 1.50 (H) 0.61 - 1.24 mg/dL   GFR calc non Af Amer 41 (L) >60 mL/min   GFR calc Af Amer 48 (L) >60 mL/min    Comment: (NOTE) The eGFR has been calculated using the CKD EPI equation. This calculation has not been validated in all clinical situations. eGFR's persistently <60 mL/min signify possible Chronic Kidney Disease.   Magnesium     Status: None   Collection Time: 10/16/14  6:08 PM  Result Value Ref  Range   Magnesium 1.7 1.7 - 2.4 mg/dL  TSH     Status: None   Collection Time: 10/16/14  6:08 PM  Result Value Ref Range   TSH 3.281 0.350 - 4.500 uIU/mL  Troponin I     Status: None   Collection Time: 10/16/14  6:08 PM  Result Value Ref Range   Troponin I <0.03 <0.031 ng/mL    Comment:        NO INDICATION OF MYOCARDIAL INJURY.   Glucose, capillary     Status: Abnormal   Collection Time: 10/16/14 11:01 PM  Result Value Ref Range   Glucose-Capillary 179 (H) 65 - 99 mg/dL  Troponin I     Status: None   Collection Time: 10/17/14 12:15 AM  Result Value Ref Range   Troponin I <0.03 <0.031 ng/mL    Comment:        NO INDICATION OF MYOCARDIAL INJURY.   Glucose, capillary     Status: None   Collection Time: 10/17/14  4:10 AM  Result Value Ref Range   Glucose-Capillary 95 65 - 99 mg/dL  Troponin  I     Status: None   Collection Time: 10/17/14  5:28 AM  Result Value Ref Range   Troponin I <0.03 <0.031 ng/mL    Comment:        NO INDICATION OF MYOCARDIAL INJURY.   Comprehensive metabolic panel     Status: Abnormal   Collection Time: 10/17/14  5:28 AM  Result Value Ref Range   Sodium 137 135 - 145 mmol/L   Potassium 4.2 3.5 - 5.1 mmol/L    Comment: DELTA CHECK NOTED REPEATED TO VERIFY    Chloride 112 (H) 101 - 111 mmol/L   CO2 18 (L) 22 - 32 mmol/L   Glucose, Bld 68 65 - 99 mg/dL   BUN 29 (H) 6 - 20 mg/dL   Creatinine, Ser 1.39 (H) 0.61 - 1.24 mg/dL   Calcium 8.6 (L) 8.9 - 10.3 mg/dL   Total Protein 6.8 6.5 - 8.1 g/dL   Albumin 3.6 3.5 - 5.0 g/dL   AST 24 15 - 41 U/L   ALT 24 17 - 63 U/L   Alkaline Phosphatase 45 38 - 126 U/L   Total Bilirubin 0.4 0.3 - 1.2 mg/dL   GFR calc non Af Amer 45 (L) >60 mL/min   GFR calc Af Amer 52 (L) >60 mL/min    Comment: (NOTE) The eGFR has been calculated using the CKD EPI equation. This calculation has not been validated in all clinical situations. eGFR's persistently <60 mL/min signify possible Chronic Kidney Disease.    Anion  gap 7 5 - 15  CBC     Status: Abnormal   Collection Time: 10/17/14  5:28 AM  Result Value Ref Range   WBC 5.1 4.0 - 10.5 K/uL   RBC 3.72 (L) 4.22 - 5.81 MIL/uL   Hemoglobin 11.9 (L) 13.0 - 17.0 g/dL   HCT 34.5 (L) 39.0 - 52.0 %   MCV 92.7 78.0 - 100.0 fL   MCH 32.0 26.0 - 34.0 pg   MCHC 34.5 30.0 - 36.0 g/dL   RDW 13.8 11.5 - 15.5 %   Platelets 165 150 - 400 K/uL  Glucose, capillary     Status: None   Collection Time: 10/17/14  7:42 AM  Result Value Ref Range   Glucose-Capillary 86 65 - 99 mg/dL  Glucose, capillary     Status: Abnormal   Collection Time: 10/17/14 11:04 AM  Result Value Ref Range   Glucose-Capillary 112 (H) 65 - 99 mg/dL     Lipid Panel  No results found for: CHOL, TRIG, HDL, CHOLHDL, VLDL, LDLCALC, LDLDIRECT   Lab Results  Component Value Date   HGBA1C 8.5 06/13/2013   HGBA1C 10.9 02/19/2013   HGBA1C 8.3* 01/30/2012     Lab Results  Component Value Date   CREATININE 1.39* 10/17/2014     HPI :81 year old male presents with history of diabetes, hypertension, dementia, history of complete heart block with a pacemaker. presents with recurrent nausea vomiting episodes, at least 5 episodes that started this morning, witnessed by the patient's caregiver. Patient has dementia at baseline and is unable to provide any history. Caregiver confirms that the patient has not had any chest pain or shortness of breath. She found the patient to be cold and clammy during his episodes of emesis, he became very diaphoretic. She was concerned about aspiration and brought him to the ER. No evidence of slurred speech, unilateral weakness or seizure-like activity.  HOSPITAL COURSE: * Nausea & vomiting-likely a side effect of antibiotics which he has been  taking for UTI. Patient has been on Pyridium and Vantin. According to caregiver he has completed antibiotics. KUB negative. UA negative, no associated diarrhea or abdominal pain. No evidence of viral gastroenteritis. No  history of gastroparesis. No history of constipation either. This was resolved and the patient tolerated a regular diet prior to discharge  Near syncope-negative orthostatics, could be dehydrated secondary to nausea vomiting this morning versus vasovagal syncope, patient hydrated overnight with IV fluids, patient has a pacemaker, had a recent device check on 05/13/14. Telemetry uneventful overnight, negative   cardiac enzymes, 2-D echo done but results pending at this time, carotid Doppler negative,, no focal neurologic deficits therefore no neuroimaging required at this time. Patient's son and caregiver by the bedside and they agree.    Third degree heart block-patient has a pacemaker in place , followed by Dr. Daneen Schick, follow-up with cardiology outpatient   Chronic diastolic heart failure-repeat 2-D echo completed results pending   Dementia-has 24-hour caregiver at home, continue Remeron, Namenda, Aricept   DM (diabetes mellitus), type 2, uncontrolled-start the patient on sliding scale insulin, continue Lantus and start Accu-Cheks  Chronic kidney disease stage III creatinine stable and at baseline  BPH-continue Proscar   Benign hypertension, controlled, resume Norvasc     Discharge Exam:  Blood pressure 125/44, pulse 68, temperature 98 F (36.7 C), temperature source Oral, resp. rate 16, height 5' 10"  (1.778 m), weight 75.433 kg (166 lb 4.8 oz), SpO2 100 %. Cardiovascular: RRR, S1 normal, S2 normal, no MRG, pulses symmetric and intact bilaterally  Pulmonary/Chest: CTAB, no wheezes, rales, or rhonchi  Abdominal: Soft. Non-tender, non-distended, bowel sounds are normal, no masses, organomegaly, or guarding present.  GU: no CVA tenderness Musculoskeletal: No joint deformities, erythema        Discharge Instructions    Diet - low sodium heart healthy    Complete by:  As directed      Increase activity slowly    Complete by:  As directed            Follow-up  Information    Follow up with Georges Lynch, MD. Go in 4 days.   Specialty:  Family Medicine   Why:  Appointment made for Monday September 19 at 0930   Contact information:   [address] [address] [phone]       Follow up with Sinclair Grooms, MD. Go in 4 days.   Specialty:  Cardiology   Why:  Appointment is made for Monday September 19th at 3:00   Contact information:   [address] [place] [address] [phone]       Signed: Reyne Dumas 10/17/2014, 12:35 PM        Time spent >45 mins

## 2014-10-17 NOTE — Progress Notes (Signed)
Echocardiogram 2D Echocardiogram has been performed.  Dakota White 10/17/2014, 11:55 AM

## 2014-10-20 NOTE — Progress Notes (Signed)
Cardiology Office Note   Date:  10/21/2014   ID:  ZIGGY CHANTHAVONG, DOB 1928-02-27, MRN [id number]  PCP:  Mickie Hillier, MD  Cardiologist:  Lesleigh Noe, MD   Chief Complaint  Patient presents with  . Coronary Artery Disease  . Congestive Heart Failure      History of Present Illness: Dakota White is a 81 y.o. male who presents for chronic diastolic heart failure progressing to systolic heart failure (LVEF 35% by recent echo, and DDD pacemaker, dementia, type 2 diabetes, and essential hypertension.  Patient was recently admitted with nausea and vomiting. No specific cardiac abnormalities were found. He did have evaluation because there was near syncope associated with nausea and vomiting. The workup identified new reduction in LV systolic function with EF 35-40%. This is in comparison with an EF estimated at 60-65% in 2013 around the time of pacemaker insertion.  He has no active cardiopulmonary complaints.     Past Medical History  Diagnosis Date  . Type II or unspecified type diabetes mellitus without mention of complication, uncontrolled   . Essential hypertension, benign   . Mental disorder   . Dementia   . Peripheral neuropathy   . Allergic rhinitis   . Asthma with bronchitis   . Complete heart block     Requiring permanent [employer] DDD pacemaker by Dr. Ladona Ridgel  . Peripheral vascular disease, unspecified   . Cardiac pacemaker in situ     Normal function  . History of echocardiogram 01/2012    EF 60-65%   . Mixed hyperlipidemia     Past Surgical History  Procedure Laterality Date  . Rotator cuff repair    . Pacemaker insertion  Dec 2013  . Permanent pacemaker insertion N/A 01/31/2012    Procedure: PERMANENT PACEMAKER INSERTION;  Surgeon: Marinus Maw, MD;  Location: [facility] CATH LAB;  Service: Cardiovascular;  Laterality: N/A;     Current Outpatient Prescriptions  Medication Sig Dispense Refill  . aspirin EC 81 MG tablet Take 81 mg by mouth at bedtime.     . donepezil (ARICEPT) 10 MG tablet Take 1 tablet (10 mg total) by mouth at bedtime. 30 tablet 11  . finasteride (PROSCAR) 5 MG tablet TAKE 1 TABLET BY MOUTH EVERY DAY 60 tablet 1  . glucose blood test strip Once daily    . insulin glargine (LANTUS) 100 UNIT/ML injection Inject 70 Units into the skin at bedtime.    . memantine (NAMENDA) 10 MG tablet Take 1 tablet (10 mg total) by mouth at bedtime. 30 tablet 2  . mirtazapine (REMERON) 15 MG tablet Take 7.5 mg by mouth at bedtime.    [name] DELICA LANCETS 33G MISC 1 Device by Does not apply route 4 (four) times daily as needed. 200 each 11  . rosuvastatin (CRESTOR) 5 MG tablet Take 1 tablet (5 mg total) by mouth at bedtime. 30 tablet 11  . sertraline (ZOLOFT) 100 MG tablet Take 1 tablet (100 mg total) by mouth at bedtime. 30 tablet 11  . carvedilol (COREG) 3.125 MG tablet Take 1 tablet (3.125 mg total) by mouth 2 (two) times daily. 60 tablet 5  . lisinopril (PRINIVIL,ZESTRIL) 2.5 MG tablet Take 1 tablet (2.5 mg total) by mouth daily. 30 tablet 5   No current facility-administered medications for this visit.    Allergies:   Actos and Ramipril    Social History:  The patient  reports that he has quit smoking. His smoking use included Cigarettes. He  has never used smokeless tobacco. He reports that he does not drink alcohol or use illicit drugs.   Family History:  The patient's family history includes Alzheimer's disease in his father and sister; COPD in his brother; Diabetes in his sister; Heart disease in his mother.    ROS:  Please see the history of present illness.   Otherwise, review of systems are positive for resolution of nausea and vomiting. Decreased memory..   All other systems are reviewed and negative.    PHYSICAL EXAM: VS:  BP 110/60 mmHg  Pulse 93  Ht  (1.803 m)  Wt 76.259 kg (168 lb 1.9 oz)  BMI 23.46 kg/m2  SpO2 97% , BMI Body mass index is 23.46 kg/(m^2). GEN: Well nourished, well developed, in no acute  distress HEENT: normal Neck: no JVD, carotid bruits, or masses Cardiac: RRR.  There is no murmur, rub, or gallop. There is no edema. Respiratory:  clear to auscultation bilaterally, normal work of breathing. GI: soft, nontender, nondistended, + BS MS: no deformity or atrophy Skin: warm and dry, no rash Neuro:  Strength and sensation are intact Psych: euthymic mood, full affect   EKG:  EKG is not ordered today. The ekg reveals atrial tracking with ventricular pacing on 10/16/2014.   Recent Labs: 10/16/2014: Magnesium 1.7; TSH 3.281 10/17/2014: ALT 24; BUN 29*; Creatinine, Ser 1.39*; Hemoglobin 11.9*; Platelets 165; Potassium 4.2; Sodium 137    Lipid Panel No results found for: CHOL, TRIG, HDL, CHOLHDL, VLDL, LDLCALC, LDLDIRECT    Wt Readings from Last 3 Encounters:  10/21/14 76.259 kg (168 lb 1.9 oz)  10/21/14 75.66 kg (166 lb 12.8 oz)  10/16/14 75.433 kg (166 lb 4.8 oz)      Other studies Reviewed: Additional studies/ records that were reviewed today include: Reviewed the discharge summary from the hospital.. The findings include personally reviewed the new echo report. New EF of 35-40%. No significant carotid disease is noted.    ASSESSMENT AND PLAN:  1. Chronic diastolic heart failure - new systolic heart failure (asymptomatic) Deterioration in LV function since the prior echo with EF now down to 35-40%. We will need to discontinue amlodipine. We will start low-dose beta blocker therapy. We will start low-dose ACE inhibitor therapy.  2. Third degree heart block Treated with DDD pacemaker  3. DM (diabetes mellitus), type 2, uncontrolled Followed by primary care  4. Chronic kidney disease, stage III We will have to follow kidney function closely after starting ACE inhibitor therapy.  5 Essential hypertension Well controlled.  6. Combined diastolic and Systolic heart failure Please see above under diastolic heart failure  Current medicines are reviewed at length  with the patient today.  The patient has the following concerns regarding medicines: We discussed the current medical regimen. He has good blood pressure control with amlodipine but now with new LV systolic dysfunction we need heart failure therapy..  The following changes/actions have been instituted:    Start very low-dose ACE inhibitor therapy with lisinopril 2.5 mg per day. This will need to be titrated based upon laboratory data and response.  Start low-dose beta blocker therapy, carvedilol 3.125 mg twice a day  Basic metabolic panel in one week  Follow-up with extender in one week for further heart failure therapy titration.  Labs/ tests ordered today include:  No orders of the defined types were placed in this encounter.     Disposition:   FU with HS in 3 months  Signed, Lesleigh Noe, MD  10/21/2014  4:03 PM    [facility] [address] [place], [place], [place]  [address] Phone: [phone]; Fax: [phone]

## 2014-10-21 ENCOUNTER — Encounter: Payer: Self-pay | Admitting: Interventional Cardiology

## 2014-10-21 ENCOUNTER — Inpatient Hospital Stay: Payer: Medicare Other | Admitting: Family Medicine

## 2014-10-21 ENCOUNTER — Encounter: Payer: Self-pay | Admitting: Family Medicine

## 2014-10-21 ENCOUNTER — Emergency Department (HOSPITAL_COMMUNITY): Payer: Medicare Other

## 2014-10-21 ENCOUNTER — Other Ambulatory Visit: Payer: Self-pay

## 2014-10-21 ENCOUNTER — Ambulatory Visit (INDEPENDENT_AMBULATORY_CARE_PROVIDER_SITE_OTHER): Payer: Medicare Other | Admitting: Interventional Cardiology

## 2014-10-21 ENCOUNTER — Observation Stay (HOSPITAL_COMMUNITY)
Admission: EM | Admit: 2014-10-21 | Discharge: 2014-10-24 | Disposition: A | Discharge status: Home / Self Care | Payer: Medicare Other | Attending: Internal Medicine | Admitting: Internal Medicine

## 2014-10-21 ENCOUNTER — Ambulatory Visit (INDEPENDENT_AMBULATORY_CARE_PROVIDER_SITE_OTHER): Payer: Medicare Other | Admitting: Family Medicine

## 2014-10-21 VITALS — BP 127/64 | HR 81 | Temp 98.4°F | Ht 70.0 in | Wt 166.8 lb

## 2014-10-21 VITALS — BP 110/60 | HR 93 | Ht 71.0 in | Wt 168.1 lb

## 2014-10-21 DIAGNOSIS — F0390 Unspecified dementia without behavioral disturbance: Secondary | ICD-10-CM | POA: Diagnosis not present

## 2014-10-21 DIAGNOSIS — R531 Weakness: Secondary | ICD-10-CM | POA: Diagnosis not present

## 2014-10-21 DIAGNOSIS — E782 Mixed hyperlipidemia: Secondary | ICD-10-CM | POA: Insufficient documentation

## 2014-10-21 DIAGNOSIS — R55 Syncope and collapse: Secondary | ICD-10-CM | POA: Diagnosis not present

## 2014-10-21 DIAGNOSIS — E1165 Type 2 diabetes mellitus with hyperglycemia: Secondary | ICD-10-CM

## 2014-10-21 DIAGNOSIS — Z950 Presence of cardiac pacemaker: Secondary | ICD-10-CM | POA: Diagnosis not present

## 2014-10-21 DIAGNOSIS — I5032 Chronic diastolic (congestive) heart failure: Secondary | ICD-10-CM

## 2014-10-21 DIAGNOSIS — I442 Atrioventricular block, complete: Secondary | ICD-10-CM

## 2014-10-21 DIAGNOSIS — Z888 Allergy status to other drugs, medicaments and biological substances status: Secondary | ICD-10-CM | POA: Diagnosis not present

## 2014-10-21 DIAGNOSIS — N400 Enlarged prostate without lower urinary tract symptoms: Secondary | ICD-10-CM | POA: Diagnosis not present

## 2014-10-21 DIAGNOSIS — Z09 Encounter for follow-up examination after completed treatment for conditions other than malignant neoplasm: Secondary | ICD-10-CM | POA: Diagnosis not present

## 2014-10-21 DIAGNOSIS — D649 Anemia, unspecified: Secondary | ICD-10-CM | POA: Diagnosis not present

## 2014-10-21 DIAGNOSIS — I5042 Chronic combined systolic (congestive) and diastolic (congestive) heart failure: Secondary | ICD-10-CM | POA: Diagnosis not present

## 2014-10-21 DIAGNOSIS — I739 Peripheral vascular disease, unspecified: Secondary | ICD-10-CM | POA: Diagnosis not present

## 2014-10-21 DIAGNOSIS — I5023 Acute on chronic systolic (congestive) heart failure: Secondary | ICD-10-CM | POA: Diagnosis not present

## 2014-10-21 DIAGNOSIS — N183 Chronic kidney disease, stage 3 unspecified: Secondary | ICD-10-CM | POA: Insufficient documentation

## 2014-10-21 DIAGNOSIS — E1122 Type 2 diabetes mellitus with diabetic chronic kidney disease: Secondary | ICD-10-CM | POA: Diagnosis not present

## 2014-10-21 DIAGNOSIS — E1129 Type 2 diabetes mellitus with other diabetic kidney complication: Secondary | ICD-10-CM | POA: Diagnosis present

## 2014-10-21 DIAGNOSIS — N179 Acute kidney failure, unspecified: Secondary | ICD-10-CM | POA: Insufficient documentation

## 2014-10-21 DIAGNOSIS — E1142 Type 2 diabetes mellitus with diabetic polyneuropathy: Secondary | ICD-10-CM | POA: Diagnosis not present

## 2014-10-21 DIAGNOSIS — R112 Nausea with vomiting, unspecified: Secondary | ICD-10-CM | POA: Diagnosis not present

## 2014-10-21 DIAGNOSIS — K625 Hemorrhage of anus and rectum: Secondary | ICD-10-CM | POA: Diagnosis not present

## 2014-10-21 DIAGNOSIS — Z794 Long term (current) use of insulin: Secondary | ICD-10-CM | POA: Insufficient documentation

## 2014-10-21 DIAGNOSIS — I129 Hypertensive chronic kidney disease with stage 1 through stage 4 chronic kidney disease, or unspecified chronic kidney disease: Secondary | ICD-10-CM | POA: Insufficient documentation

## 2014-10-21 DIAGNOSIS — Z7982 Long term (current) use of aspirin: Secondary | ICD-10-CM | POA: Diagnosis not present

## 2014-10-21 DIAGNOSIS — R4781 Slurred speech: Secondary | ICD-10-CM | POA: Diagnosis not present

## 2014-10-21 DIAGNOSIS — G459 Transient cerebral ischemic attack, unspecified: Secondary | ICD-10-CM | POA: Diagnosis not present

## 2014-10-21 DIAGNOSIS — R471 Dysarthria and anarthria: Principal | ICD-10-CM | POA: Insufficient documentation

## 2014-10-21 DIAGNOSIS — D696 Thrombocytopenia, unspecified: Secondary | ICD-10-CM | POA: Insufficient documentation

## 2014-10-21 DIAGNOSIS — Z87891 Personal history of nicotine dependence: Secondary | ICD-10-CM | POA: Insufficient documentation

## 2014-10-21 DIAGNOSIS — I10 Essential (primary) hypertension: Secondary | ICD-10-CM

## 2014-10-21 DIAGNOSIS — N189 Chronic kidney disease, unspecified: Secondary | ICD-10-CM

## 2014-10-21 DIAGNOSIS — IMO0002 Reserved for concepts with insufficient information to code with codable children: Secondary | ICD-10-CM | POA: Diagnosis present

## 2014-10-21 LAB — COMPREHENSIVE METABOLIC PANEL
ALK PHOS: 63 U/L (ref 40–115)
ALT: 20 U/L (ref 9–46)
ALT: 21 U/L (ref 17–63)
AST: 21 U/L (ref 10–35)
AST: 26 U/L (ref 15–41)
Albumin: 4.6 g/dL (ref 3.6–5.1)
Albumin: 4.2 g/dL (ref 3.5–5.0)
Alkaline Phosphatase: 54 U/L (ref 38–126)
Anion gap: 9 (ref 5–15)
BUN: 23 mg/dL (ref 7–25)
BUN: 32 mg/dL — AB (ref 6–20)
CALCIUM: 10.4 mg/dL — AB (ref 8.6–10.3)
CHLORIDE: 100 mmol/L (ref 98–110)
CHLORIDE: 104 mmol/L (ref 101–111)
CO2: 22 mmol/L (ref 20–31)
CO2: 21 mmol/L — AB (ref 22–32)
CREATININE: 2 mg/dL — AB (ref 0.61–1.24)
Calcium: 10.2 mg/dL (ref 8.9–10.3)
Creat: 1.36 mg/dL — ABNORMAL HIGH (ref 0.70–1.11)
GFR calc Af Amer: 34 mL/min — ABNORMAL LOW (ref >60)
GFR calc non Af Amer: 29 mL/min — ABNORMAL LOW (ref >60)
GLUCOSE: 175 mg/dL — AB (ref 65–99)
Glucose, Bld: 189 mg/dL — ABNORMAL HIGH (ref 65–99)
POTASSIUM: 5 mmol/L (ref 3.5–5.3)
Potassium: 4.8 mmol/L (ref 3.5–5.1)
SODIUM: 134 mmol/L — AB (ref 135–145)
Sodium: 134 mmol/L — ABNORMAL LOW (ref 135–146)
Total Bilirubin: 0.7 mg/dL (ref 0.2–1.2)
Total Bilirubin: 0.7 mg/dL (ref 0.3–1.2)
Total Protein: 8 g/dL (ref 6.1–8.1)
Total Protein: 7.7 g/dL (ref 6.5–8.1)

## 2014-10-21 LAB — CBC
HCT: 44.8 % (ref 39.0–52.0)
HEMOGLOBIN: 15.1 g/dL (ref 13.0–17.0)
MCH: 31.3 pg (ref 26.0–34.0)
MCHC: 33.7 g/dL (ref 30.0–36.0)
MCV: 92.9 fL (ref 78.0–100.0)
MPV: 9.8 fL (ref 8.6–12.4)
Platelets: 190 10*3/uL (ref 150–400)
RBC: 4.82 MIL/uL (ref 4.22–5.81)
RDW: 14.4 % (ref 11.5–15.5)
WBC: 9.2 10*3/uL (ref 4.0–10.5)

## 2014-10-21 LAB — CBG MONITORING, ED: GLUCOSE-CAPILLARY: 203 mg/dL — AB (ref 65–99)

## 2014-10-21 MED ORDER — MEMANTINE HCL 10 MG PO TABS: 10.0000 mg | ORAL_TABLET | Freq: Every day | ORAL | Status: DC

## 2014-10-21 MED ORDER — LISINOPRIL 2.5 MG PO TABS: 2.5000 mg | ORAL_TABLET | Freq: Every day | ORAL | Status: DC

## 2014-10-21 MED ORDER — SODIUM CHLORIDE 0.9 % IV BOLUS (SEPSIS)
1000.0000 mL | Freq: Once | INTRAVENOUS | Status: AC
  Administered 2014-10-22: 1000 mL via INTRAVENOUS

## 2014-10-21 MED ORDER — CARVEDILOL 3.125 MG PO TABS: 3.1250 mg | ORAL_TABLET | Freq: Two times a day (BID) | ORAL | Status: DC

## 2014-10-21 NOTE — ED Provider Notes (Signed)
CSN: [id number]     Arrival date & time 10/21/14  2155 History  This chart was scribed for Azalia Bilis, MD by Budd Palmer, ED Scribe. This patient was seen in room WA11/WA11 and the patient's care was started at 11:11 PM.     Chief Complaint  Patient presents with  . Altered Mental Status   The history is provided by the patient and a relative. No language interpreter was used.   HPI Comments: Level 5 Caveat Dakota White is a 81 y.o. male with a PMHx of dementia and DM who presents to the Emergency Department complaining of AMS onset at 9:30 PM. Pt notes he does not know why he is here. He states he feels "okay." He denies awareness of slurred speech.  Tonight, his son states that the pt's speech was very slurred starting at 9:30 PM. Per son his speech has gotten somewhat better, but is still not normal. He notes that this is similar to previous episodes of blood glucose levels above 600. He also reports exacerbation of pt's usual abnormal gait, stating it was a generalized weakness and pt needed to be held up on both sides. Pt was last normal at 9 PM tonight before going to his room. Per son, pt has had loss of appetite onset today. Per son he was admitted to the hospital last week for a vasal episode. He has been to the hospital several times for the past 5 years. 2 weeks ago he was seen in the ED in [place] for dehydration. Pt had his last f/u visit with his cardiologist 1 week ago when his Amlodipine was stopped, and he was put on 2 new medications (Lisinopril 2.5 mg daily, and Carvedilol 3.125 mg 2x daily). Per sons, pt has a PMHx of acute diastolic heart failure.  Past Medical History  Diagnosis Date  . Type II or unspecified type diabetes mellitus without mention of complication, uncontrolled   . Essential hypertension, benign   . Mental disorder   . Dementia   . Peripheral neuropathy   . Allergic rhinitis   . Asthma with bronchitis   . Complete heart block     Requiring  permanent St. Jude DDD pacemaker by Dr. Ladona Ridgel  . Peripheral vascular disease, unspecified   . Cardiac pacemaker in situ     Normal function  . History of echocardiogram 01/2012    EF 60-65%   . Mixed hyperlipidemia    Past Surgical History  Procedure Laterality Date  . Rotator cuff repair    . Pacemaker insertion  Dec 2013  . Permanent pacemaker insertion N/A 01/31/2012    Procedure: PERMANENT PACEMAKER INSERTION;  Surgeon: Marinus Maw, MD;  Location: [facility] CATH LAB;  Service: Cardiovascular;  Laterality: N/A;   Family History  Problem Relation Age of Onset  . Heart disease Mother   . Alzheimer's disease Father   . Diabetes Sister   . COPD Brother   . Alzheimer's disease Sister    Social History  Substance Use Topics  . Smoking status: Former Smoker    Types: Cigarettes  . Smokeless tobacco: Never Used  . Alcohol Use: No    Review of Systems  Unable to perform ROS: Dementia   A complete 10 system review of systems was obtained and all systems are negative except as noted in the HPI and PMH.   Allergies  Actos and Ramipril  Home Medications   Prior to Admission medications   Medication Sig Start Date End Date  Taking? Authorizing Provider  aspirin EC 81 MG tablet Take 81 mg by mouth at bedtime.   Yes Historical Provider, MD  carvedilol (COREG) 3.125 MG tablet Take 1 tablet (3.125 mg total) by mouth 2 (two) times daily. 10/21/14  Yes Lyn Records, MD  donepezil (ARICEPT) 10 MG tablet Take 1 tablet (10 mg total) by mouth at bedtime. 02/19/13  Yes Elenora Gamma, MD  finasteride (PROSCAR) 5 MG tablet TAKE 1 TABLET BY MOUTH EVERY DAY   Yes Elenora Gamma, MD  glucose blood test strip Once daily 04/16/13  Yes Elenora Gamma, MD  insulin glargine (LANTUS) 100 UNIT/ML injection Inject 70 Units into the skin every morning.  02/02/12  Yes Lyn Records, MD  lisinopril (PRINIVIL,ZESTRIL) 2.5 MG tablet Take 1 tablet (2.5 mg total) by mouth daily. 10/21/14  Yes Lyn Records,  MD  memantine (NAMENDA) 10 MG tablet Take 1 tablet (10 mg total) by mouth at bedtime. 10/21/14  Yes Latrelle Dodrill, MD  mirtazapine (REMERON) 15 MG tablet Take 7.5 mg by mouth at bedtime.   Yes Historical Provider, MD  [facility] DELICA LANCETS 33G MISC 1 Device by Does not apply route 4 (four) times daily as needed. 02/19/13  Yes Elenora Gamma, MD  rosuvastatin (CRESTOR) 5 MG tablet Take 1 tablet (5 mg total) by mouth at bedtime. 02/19/13  Yes Elenora Gamma, MD  sertraline (ZOLOFT) 100 MG tablet Take 1 tablet (100 mg total) by mouth at bedtime. 02/19/13  Yes Elenora Gamma, MD   BP 149/67 mmHg  Pulse 68  Temp(Src) 97.7 F (36.5 C) (Oral)  Resp 19  SpO2 96% Physical Exam  Constitutional: He appears well-developed and well-nourished.  HENT:  Head: Normocephalic and atraumatic.  Eyes: EOM are normal. Pupils are equal, round, and reactive to light.  Neck: Normal range of motion.  Cardiovascular: Normal rate, regular rhythm, normal heart sounds and intact distal pulses.   Pulmonary/Chest: Effort normal and breath sounds normal. No respiratory distress.  Abdominal: Soft. He exhibits no distension. There is no tenderness.  Musculoskeletal: Normal range of motion.  Neurological: He is alert.  5/5 strength in major muscle groups of  bilateral upper and lower extremities. Speech normal. No facial asymetry. Oriented x2  Skin: Skin is warm and dry.  Psychiatric: He has a normal mood and affect. Judgment normal.  Nursing note and vitals reviewed.   ED Course  Procedures  DIAGNOSTIC STUDIES: Oxygen Saturation is 98% on RA, normal by my interpretation.    COORDINATION OF CARE: 11:24 PM - Discussed possible TIA. Discussed plans to order diagnostic studies and imaging. Pt advised of plan for treatment and pt agrees.  Labs Review Labs Reviewed  COMPREHENSIVE METABOLIC PANEL - Abnormal; Notable for the following:    Sodium 134 (*)    CO2 21 (*)    Glucose, Bld 189 (*)    BUN 32  (*)    Creatinine, Ser 2.00 (*)    GFR calc non Af Amer 29 (*)    GFR calc Af Amer 34 (*)    All other components within normal limits  URINALYSIS, ROUTINE W REFLEX MICROSCOPIC (NOT AT [facility]) - Abnormal; Notable for the following:    APPearance CLOUDY (*)    All other components within normal limits  CBG MONITORING, ED - Abnormal; Notable for the following:    Glucose-Capillary 203 (*)    All other components within normal limits  PROTIME-INR  CBC WITH DIFFERENTIAL/PLATELET  CBC WITH  DIFFERENTIAL/PLATELET    Imaging Review Ct Head Wo Contrast  10/22/2014   CLINICAL DATA:  Slurred speech, abnormal gait, generalized weakness  EXAM: CT HEAD WITHOUT CONTRAST  TECHNIQUE: Contiguous axial images were obtained from the base of the skull through the vertex without intravenous contrast.  COMPARISON:  05/07/2009  FINDINGS: No evidence of parenchymal hemorrhage or extra-axial fluid collection. No mass lesion, mass effect, or midline shift.  No CT evidence of acute infarction.  Subcortical white matter and periventricular small vessel ischemic changes. Mild intracranial atherosclerosis.  Age related atrophy.  No ventriculomegaly.  The visualized paranasal sinuses are essentially clear. The mastoid air cells are unopacified.  No evidence of calvarial fracture.  IMPRESSION: No evidence of acute intracranial abnormality.  Small vessel ischemic changes.   Electronically Signed   By: Charline Bills M.D.   On: 10/22/2014 02:01   I have personally reviewed and evaluated these images and lab results as part of my medical decision-making.   EKG Interpretation   Date/Time:  Tuesday October 22 2014 02:00:03 EDT Ventricular Rate:  71 PR Interval:  220 QRS Duration: 153 QT Interval:  447 QTC Calculation: 486 R Axis:   -85 Text Interpretation:  Atrial-sensed ventricular-paced complexes No further  analysis attempted due to paced rhythm No significant change was found  Confirmed by CAMPOS  MD, KEVIN  ([id number]) on 10/22/2014 2:06:59 AM      MDM   Final diagnoses:  Dysarthria    No dysarthria this time.  Symptoms concerning for TIA.  Patient will be admitted to the hospital [facility]  I personally performed the services described in this documentation, which was scribed in my presence. The recorded information has been reviewed and is accurate.      Azalia Bilis, MD 10/22/14 [phone]

## 2014-10-21 NOTE — Patient Instructions (Signed)
Repeating bloodwork Prescription given for namenda Keep cardiologist appointment for this afternoon [facility]  Be well, Dr. Pollie Meyer

## 2014-10-21 NOTE — ED Notes (Signed)
Pt's son says that pt was sitting in his recliner tonight and he was lethargic and slurring his words, he was seen today at his cardiologist and was in the ER last week for chest pain, he also has diabetes and was told to come to ER when he becomes lethargic

## 2014-10-21 NOTE — Patient Instructions (Signed)
Medication Instructions:  Your physician has recommended you make the following change in your medication:  1) STOP Amlodipine 2) START Lisinopril 2.5mg  daily. An Rx has been sent to your pharmacy 3) START Carvedilol 3.125mg  twice daily. An Rx has nee sent to your pharmacy  Labwork: Your physician recommends that you return for lab work in: 7-10 days (bmet)  Testing/Procedures: None ordered  Follow-Up: Your physician recommends that you schedule a follow-up appointment in: 7-10 days with a PA/NP  Your physician recommends that you schedule a follow-up appointment in: October 2016 with Dr.Taylor  Your physician recommends that you schedule a follow-up appointment in: 3-4 months with Dr.Smith     Any Other Special Instructions Will Be Listed Below (If Applicable).

## 2014-10-21 NOTE — Progress Notes (Signed)
Patient ID: Dakota White, male   DOB: 1929/02/01, 81 y.o.   MRN: [id number]  HPI:  Dakota White presents for hospital follow up. Patient was hospitalized from 9/14 to 9/15 with vomiting and presyncope, thought to be related to medication for UTI. Patient has history of dementia and is accompanied by his daughter, who provides much of the history. Patient has not been seen here at the [facility] in nearly 1.5 years, gets most of his care at the [place].  They report patient is doing well. Eating and drinking well. Ambulating well. No further episodes of emesis. Had echo and doppler of carotids done in hospital. Has f/u scheduled with cardiologist this afternoon.   Patient does have history of diabetes and takes lantus 70 units daily. Followed by the [facility] for his diabetic care recently. Has in-home aide on weekdays from 9a-7p. Lives with son. Goes to adult enrichment program on Tuesdays and Thursdays, needs note saying he can go back to that. Daughter also needs refill of namenda, states their supply was taken by hospital staff and not returned at discharge.  ROS: See HPI.  PMFSH: history allergic rhinitis, asthma, BPH, dementia, T2DM, hypertension, third degree heartblock with pacer in place  PHYSICAL EXAM: BP 127/64 mmHg  Pulse 81  Temp(Src) 98.4 F (36.9 C) (Oral)  Ht  (1.778 m)  Wt 166 lb 12.8 oz (75.66 kg)  BMI 23.93 kg/m2 Gen: NAD, pleasant, cooperative HEENT: NCAT Heart: RRR no murmur Lungs: CTAB NWOB Neuro: grossly nonfocal, speech normal Abdomen: soft, nontender to palpation  ASSESSMENT/PLAN:  1. Hospital follow up - doing well. No further episodes of emesis. Cardiologist appointment later today. - repeat labs: CBC, CMET as requested in hospital d/c summary - rx written for namenda, encouraged daughter to call hospital unit to see if they can get back the supply that was taken to the pharmacy - letter given saying patient can resume adult enrichment program - continue [facility] for [facility] problems  [place] J. Pollie Meyer, MD [facility]

## 2014-10-21 NOTE — ED Notes (Signed)
Family, son and grandson at bedside.

## 2014-10-22 ENCOUNTER — Other Ambulatory Visit: Payer: Self-pay

## 2014-10-22 ENCOUNTER — Encounter: Payer: Self-pay | Admitting: Family Medicine

## 2014-10-22 ENCOUNTER — Encounter (HOSPITAL_COMMUNITY): Payer: Self-pay

## 2014-10-22 DIAGNOSIS — R4781 Slurred speech: Secondary | ICD-10-CM | POA: Diagnosis not present

## 2014-10-22 DIAGNOSIS — R471 Dysarthria and anarthria: Secondary | ICD-10-CM | POA: Diagnosis present

## 2014-10-22 DIAGNOSIS — R531 Weakness: Secondary | ICD-10-CM | POA: Diagnosis not present

## 2014-10-22 DIAGNOSIS — N179 Acute kidney failure, unspecified: Secondary | ICD-10-CM | POA: Diagnosis not present

## 2014-10-22 DIAGNOSIS — E1165 Type 2 diabetes mellitus with hyperglycemia: Secondary | ICD-10-CM | POA: Diagnosis not present

## 2014-10-22 DIAGNOSIS — N183 Chronic kidney disease, stage 3 (moderate): Secondary | ICD-10-CM | POA: Diagnosis not present

## 2014-10-22 LAB — URINALYSIS, ROUTINE W REFLEX MICROSCOPIC
Bilirubin Urine: NEGATIVE
GLUCOSE, UA: NEGATIVE mg/dL
HGB URINE DIPSTICK: NEGATIVE
KETONES UR: NEGATIVE mg/dL
LEUKOCYTES UA: NEGATIVE
Nitrite: NEGATIVE
PH: 5 (ref 5.0–8.0)
Protein, ur: NEGATIVE mg/dL
Specific Gravity, Urine: 1.011 (ref 1.005–1.030)
Urobilinogen, UA: 0.2 mg/dL (ref 0.0–1.0)

## 2014-10-22 LAB — GLUCOSE, CAPILLARY
GLUCOSE-CAPILLARY: 108 mg/dL — AB (ref 65–99)
Glucose-Capillary: 191 mg/dL — ABNORMAL HIGH (ref 65–99)
Glucose-Capillary: 204 mg/dL — ABNORMAL HIGH (ref 65–99)
Glucose-Capillary: 154 mg/dL — ABNORMAL HIGH (ref 65–99)

## 2014-10-22 LAB — BASIC METABOLIC PANEL
Anion gap: 10 (ref 5–15)
BUN: 32 mg/dL — AB (ref 6–20)
CALCIUM: 9.2 mg/dL (ref 8.9–10.3)
CHLORIDE: 104 mmol/L (ref 101–111)
CO2: 20 mmol/L — ABNORMAL LOW (ref 22–32)
CREATININE: 1.71 mg/dL — AB (ref 0.61–1.24)
GFR, EST AFRICAN AMERICAN: 41 mL/min — AB (ref >60)
GFR, EST NON AFRICAN AMERICAN: 35 mL/min — AB (ref >60)
Glucose, Bld: 169 mg/dL — ABNORMAL HIGH (ref 65–99)
Potassium: 4.6 mmol/L (ref 3.5–5.1)
SODIUM: 134 mmol/L — AB (ref 135–145)

## 2014-10-22 LAB — PROTIME-INR
INR: 1.04 (ref 0.00–1.49)
Prothrombin Time: 13.8 seconds (ref 11.6–15.2)

## 2014-10-22 MED ORDER — DONEPEZIL HCL 10 MG PO TABS
10.0000 mg | ORAL_TABLET | Freq: Every day | ORAL | Status: DC
  Administered 2014-10-22 – 2014-10-23 (×2): 10 mg via ORAL
  Filled 2014-10-22 (×2): qty 1
  Filled 2014-10-22 (×2): qty 2
  Filled 2014-10-22: qty 1

## 2014-10-22 MED ORDER — ASPIRIN EC 81 MG PO TBEC
81.0000 mg | DELAYED_RELEASE_TABLET | Freq: Every day | ORAL | Status: DC
  Administered 2014-10-22 – 2014-10-23 (×2): 81 mg via ORAL
  Filled 2014-10-22 (×2): qty 1

## 2014-10-22 MED ORDER — MIRTAZAPINE 7.5 MG PO TABS
7.5000 mg | ORAL_TABLET | Freq: Every day | ORAL | Status: DC
  Administered 2014-10-22 – 2014-10-23 (×2): 7.5 mg via ORAL
  Filled 2014-10-22 (×3): qty 1

## 2014-10-22 MED ORDER — CARVEDILOL 3.125 MG PO TABS
3.1250 mg | ORAL_TABLET | Freq: Two times a day (BID) | ORAL | Status: DC
  Administered 2014-10-22 – 2014-10-23 (×3): 3.125 mg via ORAL
  Filled 2014-10-22 (×3): qty 1

## 2014-10-22 MED ORDER — MEMANTINE HCL 10 MG PO TABS
10.0000 mg | ORAL_TABLET | Freq: Every day | ORAL | Status: DC
  Administered 2014-10-22 – 2014-10-23 (×2): 10 mg via ORAL
  Filled 2014-10-22 (×2): qty 1

## 2014-10-22 MED ORDER — INSULIN GLARGINE 100 UNIT/ML ~~LOC~~ SOLN
50.0000 [IU] | Freq: Every morning | SUBCUTANEOUS | Status: DC
  Administered 2014-10-22 – 2014-10-23 (×2): 50 [IU] via SUBCUTANEOUS
  Filled 2014-10-22 (×2): qty 0.5

## 2014-10-22 MED ORDER — SODIUM CHLORIDE 0.9 % IJ SOLN
3.0000 mL | Freq: Two times a day (BID) | INTRAMUSCULAR | Status: DC
  Administered 2014-10-22 – 2014-10-24 (×3): 3 mL via INTRAVENOUS

## 2014-10-22 MED ORDER — INSULIN ASPART 100 UNIT/ML ~~LOC~~ SOLN
0.0000 [IU] | Freq: Three times a day (TID) | SUBCUTANEOUS | Status: DC
  Administered 2014-10-22: 5 [IU] via SUBCUTANEOUS
  Administered 2014-10-22 – 2014-10-23 (×2): 3 [IU] via SUBCUTANEOUS

## 2014-10-22 MED ORDER — ROSUVASTATIN CALCIUM 5 MG PO TABS
5.0000 mg | ORAL_TABLET | Freq: Every day | ORAL | Status: DC
  Administered 2014-10-22 – 2014-10-23 (×2): 5 mg via ORAL
  Filled 2014-10-22 (×2): qty 1

## 2014-10-22 MED ORDER — HEPARIN SODIUM (PORCINE) 5000 UNIT/ML IJ SOLN
5000.0000 [IU] | Freq: Three times a day (TID) | INTRAMUSCULAR | Status: DC
  Administered 2014-10-22 – 2014-10-24 (×7): 5000 [IU] via SUBCUTANEOUS
  Filled 2014-10-22 (×7): qty 1

## 2014-10-22 MED ORDER — FINASTERIDE 5 MG PO TABS
5.0000 mg | ORAL_TABLET | Freq: Every day | ORAL | Status: DC
  Administered 2014-10-22 – 2014-10-24 (×3): 5 mg via ORAL
  Filled 2014-10-22 (×3): qty 1

## 2014-10-22 MED ORDER — SERTRALINE HCL 100 MG PO TABS
100.0000 mg | ORAL_TABLET | Freq: Every day | ORAL | Status: DC
  Administered 2014-10-22 – 2014-10-23 (×2): 100 mg via ORAL
  Filled 2014-10-22 (×2): qty 1

## 2014-10-22 MED ORDER — SODIUM CHLORIDE 0.9 % IV SOLN
INTRAVENOUS | Status: AC
  Administered 2014-10-22 – 2014-10-23 (×2): via INTRAVENOUS

## 2014-10-22 NOTE — Progress Notes (Signed)
BP 165/72 mmHg  Pulse 66  Temp(Src) 98.2 F (36.8 C) (Oral)  Resp 18  Ht  (1.778 m)  Wt 76.2 kg (167 lb 15.9 oz)  BMI 24.10 kg/m2  SpO2 99%  Speech has normalized, his blood pressure is trending up, as it was low on admission.. Patient has moderate dementia and cannot give an accurate history. His baseline creatinine is 1.3-1.5 he was on IV hydration on admission his creatinine was 2.0 is now 1.7. Continue to hold ACE inhibitor.  In general he is awake alert and oriented 3 Cardiovascular: Patient placed regular rate and rhythm. Lungs: Good air movement clear to auscultation. Neurological exam: He is awake alert and oriented 3 with coherent and fluent language 3-12 are grossly intact sensation is intact throughout muscle strength is followed 5 no fluctuation deep tendon reflexes 2+ bilaterally and symmetrical Romberg is negative.  PT consult pending.

## 2014-10-22 NOTE — H&P (Signed)
[facility] History and Physical  SLADE PIERPOINT WUJ:[id number] DOB: May 12, 1928 DOA: 10/21/2014  Referring physician: EDP PCP: Mickie Hillier, MD   Chief Complaint: Dysarthria   HPI: Dakota White is a 81 y.o. male with h/o dementia, CHF, presents to ED with slurred speech.  Symptoms onset at 9:30pm, improved by time he got to ED per son who is his caregiver.  Returned for 20 mins in ED.  Patient also had generalized weakness during this time.  He was started on carvedilol and lisinopril today after being found to have new systolic CHF on echo during recent admit.  Review of Systems: Systems reviewed.  As above, otherwise negative  Past Medical History  Diagnosis Date  . Type II or unspecified type diabetes mellitus without mention of complication, uncontrolled   . Essential hypertension, benign   . Mental disorder   . Dementia   . Peripheral neuropathy   . Allergic rhinitis   . Asthma with bronchitis   . Complete heart block     Requiring permanent St. Jude DDD pacemaker by Dr. Ladona Ridgel  . Peripheral vascular disease, unspecified   . Cardiac pacemaker in situ     Normal function  . History of echocardiogram 01/2012    EF 60-65%   . Mixed hyperlipidemia    Past Surgical History  Procedure Laterality Date  . Rotator cuff repair    . Pacemaker insertion  Dec 2013  . Permanent pacemaker insertion N/A 01/31/2012    Procedure: PERMANENT PACEMAKER INSERTION;  Surgeon: Marinus Maw, MD;  Location: [facility] CATH LAB;  Service: Cardiovascular;  Laterality: N/A;   Social History:  reports that he has quit smoking. His smoking use included Cigarettes. He has never used smokeless tobacco. He reports that he does not drink alcohol or use illicit drugs.  Allergies  Allergen Reactions  . Actos [Pioglitazone]     "makes me feel lousy and makes me bleed"  . Ramipril     "pt not aware of allergy"    Family History  Problem Relation Age of Onset  . Heart disease Mother   .  Alzheimer's disease Father   . Diabetes Sister   . COPD Brother   . Alzheimer's disease Sister      Prior to Admission medications   Medication Sig Start Date End Date Taking? Authorizing Provider  aspirin EC 81 MG tablet Take 81 mg by mouth at bedtime.   Yes Historical Provider, MD  carvedilol (COREG) 3.125 MG tablet Take 1 tablet (3.125 mg total) by mouth 2 (two) times daily. 10/21/14  Yes Lyn Records, MD  donepezil (ARICEPT) 10 MG tablet Take 1 tablet (10 mg total) by mouth at bedtime. 02/19/13  Yes Elenora Gamma, MD  finasteride (PROSCAR) 5 MG tablet TAKE 1 TABLET BY MOUTH EVERY DAY   Yes Elenora Gamma, MD  glucose blood test strip Once daily 04/16/13  Yes Elenora Gamma, MD  insulin glargine (LANTUS) 100 UNIT/ML injection Inject 70 Units into the skin every morning.  02/02/12  Yes Lyn Records, MD  memantine (NAMENDA) 10 MG tablet Take 1 tablet (10 mg total) by mouth at bedtime. 10/21/14  Yes Latrelle Dodrill, MD  mirtazapine (REMERON) 15 MG tablet Take 7.5 mg by mouth at bedtime.   Yes Historical Provider, MD  [facility] DELICA LANCETS 33G MISC 1 Device by Does not apply route 4 (four) times daily as needed. 02/19/13  Yes Elenora Gamma, MD  rosuvastatin (CRESTOR) 5 MG tablet  Take 1 tablet (5 mg total) by mouth at bedtime. 02/19/13  Yes Elenora Gamma, MD  sertraline (ZOLOFT) 100 MG tablet Take 1 tablet (100 mg total) by mouth at bedtime. 02/19/13  Yes Elenora Gamma, MD   Physical Exam: Filed Vitals:   10/22/14 0300  BP: 149/67  Pulse: 68  Temp:   Resp: 19    BP 149/67 mmHg  Pulse 68  Temp(Src) 97.7 F (36.5 C) (Oral)  Resp 19  SpO2 96%  General Appearance:    Alert, oriented, no distress, appears stated age  Head:    Normocephalic, atraumatic  Eyes:    PERRL, EOMI, sclera non-icteric        Nose:   Nares without drainage or epistaxis. Mucosa, turbinates normal  Throat:   Moist mucous membranes. Oropharynx without erythema or exudate.  Neck:   Supple.  No carotid bruits.  No thyromegaly.  No lymphadenopathy.   Back:     No CVA tenderness, no spinal tenderness  Lungs:     Clear to auscultation bilaterally, without wheezes, rhonchi or rales  Chest wall:    No tenderness to palpitation  Heart:    Regular rate and rhythm without murmurs, gallops, rubs  Abdomen:     Soft, non-tender, nondistended, normal bowel sounds, no organomegaly  Genitalia:    deferred  Rectal:    deferred  Extremities:   No clubbing, cyanosis or edema.  Pulses:   2+ and symmetric all extremities  Skin:   Skin color, texture, turgor normal, no rashes or lesions  Lymph nodes:   Cervical, supraclavicular, and axillary nodes normal  Neurologic:   CNII-XII intact. Normal strength, sensation and reflexes      throughout    Labs on Admission:  Basic Metabolic Panel:  Recent Labs Lab 10/16/14 1214 10/16/14 1228 10/16/14 1808 10/17/14 0528 10/21/14 1014 10/21/14 2223  NA 136  --   --  137 134* 134*  K 5.3*  --   --  4.2 5.0 4.8  CL 109  --   --  112* 100 104  CO2 18*  --   --  18* 22 21*  GLUCOSE 153*  --   --  68 175* 189*  BUN 31*  --   --  29* 23 32*  CREATININE 1.66*  --  1.50* 1.39* 1.36* 2.00*  CALCIUM 9.3  --   --  8.6* 10.4* 10.2  MG  --  1.8 1.7  --   --   --   PHOS  --  2.8  --   --   --   --    Liver Function Tests:  Recent Labs Lab 10/16/14 1214 10/17/14 0528 10/21/14 1014 10/21/14 2223  AST 34 ALT ALKPHOS 55 45 63 54  BILITOT 0.7 0.4 0.7 0.7  PROT 8.5* 6.8 8.0 7.7  ALBUMIN 4.5 3.6 4.6 4.2    Recent Labs Lab 10/16/14 1214  LIPASE 39   No results for input(s): AMMONIA in the last 168 hours. CBC:  Recent Labs Lab 10/16/14 1214 10/16/14 1808 10/17/14 0528 10/21/14 1014  WBC 5.8 4.8 5.1 9.2  HGB 13.8 13.3 11.9* 15.1  HCT 41.5 39.2 34.5* 44.8  MCV 93.3 91.6 92.7 92.9  PLT 173 177 165 190   Cardiac Enzymes:  Recent Labs Lab 10/16/14 1808 10/17/14 0015 10/17/14 0528  TROPONINI <0.03 <0.03 <0.03     BNP (last 3 results) No results for input(s): PROBNP in  the last 8760 hours. CBG:  Recent Labs Lab 10/16/14 2301 10/17/14 0410 10/17/14 0742 10/17/14 1104 10/21/14 2205  GLUCAP 179* 95 86 112* 203*    Radiological Exams on Admission: Ct Head Wo Contrast  10/22/2014   CLINICAL DATA:  Slurred speech, abnormal gait, generalized weakness  EXAM: CT HEAD WITHOUT CONTRAST  TECHNIQUE: Contiguous axial images were obtained from the base of the skull through the vertex without intravenous contrast.  COMPARISON:  05/07/2009  FINDINGS: No evidence of parenchymal hemorrhage or extra-axial fluid collection. No mass lesion, mass effect, or midline shift.  No CT evidence of acute infarction.  Subcortical white matter and periventricular small vessel ischemic changes. Mild intracranial atherosclerosis.  Age related atrophy.  No ventriculomegaly.  The visualized paranasal sinuses are essentially clear. The mastoid air cells are unopacified.  No evidence of calvarial fracture.  IMPRESSION: No evidence of acute intracranial abnormality.  Small vessel ischemic changes.   Electronically Signed   By: Charline Bills M.D.   On: 10/22/2014 02:01    EKG: Independently reviewed.  Assessment/Plan Principal Problem:   Dysarthria Active Problems:   Acute on chronic kidney failure   DM (diabetes mellitus), type 2, uncontrolled   Chronic kidney disease, stage III (moderate)   1. Dysarthria - 1. While TIA is possible, feel that more likely his symptoms today are a result of BP med changes that occurred today coupled with his acute deterioration in kidney function (creatinine jumped from 1.3 this morning to 2.0 in just 12 hours!) 2. Unable to get MRI due to presence of pacemaker 3. Admitting for observation 2. AKI on CKD stage 3 - 1. Stopping ACEi that got started yesterday 2. Repeat BMP in AM 3. Will continue his carvedilol that got started yesterday for now 3. DM2 - 1. Reduce home lantus to 50 units  Qam while inpatient 2. Add moderate dose SSI ac/hs    Code Status: Full Code  Family Communication: Spoke with son on phone Disposition Plan: Admit to obs   Time spent: 70 min  GARDNER, JARED M. [facility] Pager [phone]  If 7AM-7PM, please contact the day team taking care of the patient [email or web address] Password [facility] 10/22/2014, 4:41 AM

## 2014-10-22 NOTE — ED Notes (Signed)
Patient is resting comfortably. 

## 2014-10-22 NOTE — Evaluation (Signed)
Physical Therapy Evaluation Patient Details Name: Dakota White MRN: [id number] DOB: [birth year] Today's Date: 10/22/2014   History of Present Illness  Dakota White is a 81 y.o. male with h/o dementia, CHF, presents to ED 10/21/14 with slurred speech. Symptoms onset at 9:30pm, improved by time he got to ED per son who is his caregiver. Returned for 20 mins in ED. Patient also had generalized weakness during this time.Patient had recent BP medication change.  Clinical Impression  Patient tolerated ambulation in hall. Patient needs close supervision for ambulation at this time. Patient will benefit from PT to address problems listed in note below.   Follow Up Recommendations Home health PT;Supervision/Assistance - 24 hour    Equipment Recommendations  None recommended by PT    Recommendations for Other Services       Precautions / Restrictions Precautions Precautions: Fall      Mobility  Bed Mobility Overal bed mobility: Needs Assistance Bed Mobility: Supine to Sit     Supine to sit: Supervision        Transfers Overall transfer level: Needs assistance Equipment used: Rolling walker (2 wheeled) Transfers: Sit to/from [employer] Sit to Stand: Min guard Stand pivot transfers: Min guard          Ambulation/Gait Ambulation/Gait assistance: Min guard;Min assist Ambulation Distance (Feet): 440 Feet Assistive device: Rolling walker (2 wheeled) Gait Pattern/deviations: Step-to pattern;Step-through pattern     General Gait Details: slow, assist with turns, cues to stay near to [employer] Mobility    Modified Rankin (Stroke Patients Only)       Balance Overall balance assessment: History of Falls;Needs assistance Sitting-balance support: Feet supported;No upper extremity supported Sitting balance-Leahy Scale: Fair     Standing balance support: During functional activity;Single extremity supported Standing  balance-Leahy Scale: Fair Standing balance comment: manipulates clothing to urinate,                              Pertinent Vitals/Pain Pain Assessment: No/denies pain    Home Living Family/patient expects to be discharged to:: Private residence Living Arrangements: Children;Other relatives Available Help at Discharge: Personal care attendant;Available 24 hours/day Type of Home: House Home Access: Stairs to enter     Home Layout: One level Home Equipment: Walker - 2 wheels Additional Comments: has caregiver [phone], son and grandson stay at night.    Prior Function Level of Independence: Needs assistance   Gait / Transfers Assistance Needed: caregiver reports that patient does get up to bathroom without assist, does not use RW even with instruction.     Comments: caregiver reports that patient needs a lot of encouragement to mobilize.     Hand Dominance        Extremity/Trunk Assessment   Upper Extremity Assessment: Generalized weakness           Lower Extremity Assessment: Generalized weakness      Cervical / Trunk Assessment: Kyphotic  Communication      Cognition Arousal/Alertness: Awake/alert Behavior During Therapy: WFL for tasks assessed/performed;Flat affect Overall Cognitive Status: History of cognitive impairments - at baseline                      General Comments      Exercises        Assessment/Plan    PT Assessment Patient needs continued PT services  PT  Diagnosis Difficulty walking;Generalized weakness   PT Problem List Decreased strength;Decreased activity tolerance;Decreased balance;Decreased mobility;Decreased knowledge of use of DME;Decreased safety awareness;Decreased knowledge of precautions  PT Treatment Interventions DME instruction;Gait training;Functional mobility training;Therapeutic activities;Therapeutic exercise;Patient/family education   PT Goals (Current goals can be found in the Care Plan section)  Acute Rehab PT Goals Patient Stated Goal: none PT Goal Formulation: With family Time For Goal Achievement: 11/05/14 Potential to Achieve Goals: Good    Frequency Min 3X/week   Barriers to discharge        Co-evaluation               End of Session Equipment Utilized During Treatment: Gait belt Activity Tolerance: Patient tolerated treatment well Patient left: in chair;with call bell/phone within reach;with chair alarm set;with family/visitor present Nurse Communication: Mobility status    Functional Assessment Tool Used: clinical judgement Functional Limitation: Mobility: Walking and moving around Mobility: Walking and Moving Around Current Status [phone]): At least 20 percent but less than 40 percent impaired, limited or restricted Mobility: Walking and Moving Around Goal Status [phone]): At least 1 percent but less than 20 percent impaired, limited or restricted    Time: 6213-0865 PT Time Calculation (min) (ACUTE ONLY): 22 min   Charges:   PT Evaluation $Initial PT Evaluation Tier I: 1 Procedure     PT G Codes:   PT G-Codes **NOT FOR INPATIENT CLASS** Functional Assessment Tool Used: clinical judgement Functional Limitation: Mobility: Walking and moving around Mobility: Walking and Moving Around Current Status (H8469): At least 20 percent but less than 40 percent impaired, limited or restricted Mobility: Walking and Moving Around Goal Status [phone]): At least 1 percent but less than 20 percent impaired, limited or restricted    Rada Hay 10/22/2014, 1:11 PM

## 2014-10-23 DIAGNOSIS — R471 Dysarthria and anarthria: Secondary | ICD-10-CM | POA: Diagnosis not present

## 2014-10-23 DIAGNOSIS — N183 Chronic kidney disease, stage 3 (moderate): Secondary | ICD-10-CM

## 2014-10-23 DIAGNOSIS — I10 Essential (primary) hypertension: Secondary | ICD-10-CM

## 2014-10-23 DIAGNOSIS — E11649 Type 2 diabetes mellitus with hypoglycemia without coma: Secondary | ICD-10-CM

## 2014-10-23 DIAGNOSIS — N179 Acute kidney failure, unspecified: Secondary | ICD-10-CM

## 2014-10-23 LAB — BASIC METABOLIC PANEL
Anion gap: 6 (ref 5–15)
BUN: 33 mg/dL — ABNORMAL HIGH (ref 6–20)
CALCIUM: 8.8 mg/dL — AB (ref 8.9–10.3)
CHLORIDE: 108 mmol/L (ref 101–111)
CO2: 22 mmol/L (ref 22–32)
Creatinine, Ser: 1.64 mg/dL — ABNORMAL HIGH (ref 0.61–1.24)
GFR calc Af Amer: 43 mL/min — ABNORMAL LOW (ref >60)
GFR, EST NON AFRICAN AMERICAN: 37 mL/min — AB (ref >60)
GLUCOSE: 87 mg/dL (ref 65–99)
Potassium: 4.5 mmol/L (ref 3.5–5.1)
Sodium: 136 mmol/L (ref 135–145)

## 2014-10-23 LAB — GLUCOSE, CAPILLARY
GLUCOSE-CAPILLARY: 67 mg/dL (ref 65–99)
GLUCOSE-CAPILLARY: 129 mg/dL — AB (ref 65–99)
Glucose-Capillary: 89 mg/dL (ref 65–99)
Glucose-Capillary: 160 mg/dL — ABNORMAL HIGH (ref 65–99)
Glucose-Capillary: 171 mg/dL — ABNORMAL HIGH (ref 65–99)

## 2014-10-23 MED ORDER — SODIUM CHLORIDE 0.9 % IV SOLN
INTRAVENOUS | Status: AC
  Administered 2014-10-23: 20:00:00 via INTRAVENOUS

## 2014-10-23 MED ORDER — INSULIN ASPART 100 UNIT/ML ~~LOC~~ SOLN
0.0000 [IU] | Freq: Three times a day (TID) | SUBCUTANEOUS | Status: DC
  Administered 2014-10-23 – 2014-10-24 (×3): 2 [IU] via SUBCUTANEOUS

## 2014-10-23 MED ORDER — INSULIN GLARGINE 100 UNIT/ML ~~LOC~~ SOLN
40.0000 [IU] | Freq: Every morning | SUBCUTANEOUS | Status: DC
  Administered 2014-10-24: 40 [IU] via SUBCUTANEOUS
  Filled 2014-10-23 (×2): qty 0.4

## 2014-10-23 MED ORDER — CARVEDILOL 6.25 MG PO TABS
6.2500 mg | ORAL_TABLET | Freq: Two times a day (BID) | ORAL | Status: DC
  Administered 2014-10-23 – 2014-10-24 (×2): 6.25 mg via ORAL
  Filled 2014-10-23 (×2): qty 1

## 2014-10-23 NOTE — Progress Notes (Addendum)
PROGRESS NOTE    Dakota White XBJ:[id number] DOB: September 11, 1928 DOA: 10/21/2014 PCP: Mickie Hillier, MD  Cardiology: Dr. Verdis Prime Endocrinology: Dr. Jules Husbands at the [facility]  HPI/Brief narrative 81 year old male with history of moderate dementia, lives with son, ambulates with the help of a walker, PMH of chronic diastolic heart failure progressing to new systolic heart failure (LVEF 35% by recent echo), DDD pacemaker, DM 2, stage III CKD, BPH and HTN, recently hospitalized 10/16/14-10/17/14 for nausea, vomiting & near syncope, readmitted on 10/21/14 to [facility] with transient slurred speech and noted to have acute kidney injury and creatinine of 2. Post recent discharge, seen by his cardiologist-amlodipine discontinued and newly started on low-dose carvedilol and ACEI with plans to follow-up in one week with repeat BMP.   Assessment/Plan:  Acute on stage III chronic kidney disease - Likely secondary to ACEI-discontinued - Treating with IV fluids and creatinine has gradually improved to 1.64. Continue additional 24 hours of IV fluids and follow BMP in a.m. - Baseline creatinine probably in the 1.3-1.4 range.  Chronic diastolic CHF-new systolic CHF - Deterioration in LV function since prior echo with EF now down to 35-40 percent. Continue newly started carvedilol. ACEI discontinued secondary to acute kidney injury. Discussed with Dr. Katrinka Blazing, cardiology: Recommended increasing carvedilol to 6.25 leg grams twice a day and avoid BiDil at this time.  Essential hypertension - Mildly uncontrolled. Continue current medications and monitor.  Third-degree heart block - Status post DDD pacemaker  DM 2 with renal complications - Transient hypoglycemia this morning. Possibly secondary to poor oral intake. Reduce Lantus dose. Change NovoLog sliding scale to sensitive. Monitor closely.  Transient slurred speech - Possibly related to metabolic encephalopathy from acute kidney injury. Low  index of suspicion for CVA or TIA. CT head negative for stroke. Unable to perform MRI secondary to pacemaker. Will check fasting lipids for completion. Resolved and mental status at baseline as per son  Moderate dementia - Mental status at baseline now.    DVT prophylaxis: Heparin subcutaneous Code Status: Full Family Communication: Discussed with son at bedside Disposition Plan: DC possibly 9/22   Consultants:  None  Procedures:  None  Antibiotics:  None   Subjective: Patient denies complaints. As per son at bedside, no recurrence of slurred speech and mental status is at baseline.  Objective: Filed Vitals:   10/22/14 2127 10/23/14 0514 10/23/14 1000 10/23/14 1300  BP: 151/70 166/78 166/78 136/62  Pulse: 71 60  61  Temp: 98.2 F (36.8 C) 97.6 F (36.4 C)  97.4 F (36.3 C)  TempSrc: Oral Oral  Oral  Resp: Height:      Weight:      SpO2: 100% 98%  94%    Intake/Output Summary (Last 24 hours) at 10/23/14 1459 Last data filed at 10/23/14 0900  Gross per 24 hour  Intake 1192.5 ml  Output   1500 ml  Net -307.5 ml   Filed Weights   10/22/14 0528  Weight: 76.2 kg (167 lb 15.9 oz)     Exam:  General exam: Pleasant elderly male sitting up in bed eating breakfast this morning. Respiratory system: Clear. No increased work of breathing. Cardiovascular system: S1 & S2 heard, RRR. No JVD, murmurs, gallops, clicks or pedal edema. Telemetry: AV paced rhythm. Gastrointestinal system: Abdomen is nondistended, soft and nontender. Normal bowel sounds heard. Central nervous system: Alert and oriented. No focal neurological deficits. Extremities: Symmetric 5 x 5 power.   Data Reviewed:  Basic Metabolic Panel:  Recent Labs Lab 10/16/14 1808 10/17/14 0528 10/21/14 1014 10/21/14 2223 10/22/14 0501 10/23/14 0820  NA  --  137 134* 134* 134* 136  K  --  4.2 5.0 4.8 4.6 4.5  CL  --  112* 100 104 104 108  CO2  --  18* 22 21* 20* 22  GLUCOSE  --  68 175*  189* 169* 87  BUN  --  29* 23 32* 32* 33*  CREATININE 1.50* 1.39* 1.36* 2.00* 1.71* 1.64*  CALCIUM  --  8.6* 10.4* 10.2 9.2 8.8*  MG 1.7  --   --   --   --   --    Liver Function Tests:  Recent Labs Lab 10/17/14 0528 10/21/14 1014 10/21/14 2223  AST ALT ALKPHOS 45 63 54  BILITOT 0.4 0.7 0.7  PROT 6.8 8.0 7.7  ALBUMIN 3.6 4.6 4.2   No results for input(s): LIPASE, AMYLASE in the last 168 hours. No results for input(s): AMMONIA in the last 168 hours. CBC:  Recent Labs Lab 10/16/14 1808 10/17/14 0528 10/21/14 1014  WBC 4.8 5.1 9.2  HGB 13.3 11.9* 15.1  HCT 39.2 34.5* 44.8  MCV 91.6 92.7 92.9  PLT 177 165 190   Cardiac Enzymes:  Recent Labs Lab 10/16/14 1808 10/17/14 0015 10/17/14 0528  TROPONINI <0.03 <0.03 <0.03   BNP (last 3 results) No results for input(s): PROBNP in the last 8760 hours. CBG:  Recent Labs Lab 10/22/14 1632 10/22/14 2149 10/23/14 0755 10/23/14 0815 10/23/14 1158  GLUCAP 204* 154* 67 89 160*    No results found for this or any previous visit (from the past 240 hour(s)).         Studies: Ct Head Wo Contrast  10/22/2014   CLINICAL DATA:  Slurred speech, abnormal gait, generalized weakness  EXAM: CT HEAD WITHOUT CONTRAST  TECHNIQUE: Contiguous axial images were obtained from the base of the skull through the vertex without intravenous contrast.  COMPARISON:  05/07/2009  FINDINGS: No evidence of parenchymal hemorrhage or extra-axial fluid collection. No mass lesion, mass effect, or midline shift.  No CT evidence of acute infarction.  Subcortical white matter and periventricular small vessel ischemic changes. Mild intracranial atherosclerosis.  Age related atrophy.  No ventriculomegaly.  The visualized paranasal sinuses are essentially clear. The mastoid air cells are unopacified.  No evidence of calvarial fracture.  IMPRESSION: No evidence of acute intracranial abnormality.  Small vessel ischemic changes.    Electronically Signed   By: Charline Bills M.D.   On: 10/22/2014 02:01        Scheduled Meds: . aspirin EC  81 mg Oral QHS  . carvedilol  3.125 mg Oral BID  . donepezil  10 mg Oral QHS  . finasteride  5 mg Oral Daily  . heparin  5,000 Units Subcutaneous 3 times per day  . insulin aspart  0-15 Units Subcutaneous TID WC  . insulin glargine  50 Units Subcutaneous q morning - 10a  . memantine  10 mg Oral QHS  . mirtazapine  7.5 mg Oral QHS  . rosuvastatin  5 mg Oral QHS  . sertraline  100 mg Oral QHS  . sodium chloride  3 mL Intravenous Q12H   Continuous Infusions: . sodium chloride 60 mL/hr at 10/23/14 1107    Principal Problem:   Dysarthria Active Problems:   Acute on chronic kidney failure   DM (diabetes mellitus), type 2, uncontrolled   Chronic  kidney disease, stage III (moderate)    Time spent: 30 minutes    HONGALGI,ANAND, MD, FACP, FHM. [facility] Pager [phone]  If 7PM-7AM, please contact night-coverage [email or web address] Password [facility] 10/23/2014, 2:59 PM

## 2014-10-23 NOTE — Plan of Care (Signed)
Problem: Phase II Progression Outcomes Goal: Progress activity as tolerated unless otherwise ordered Outcome: Progressing Patient eating, drinking and walking to the restroom  Goal: Discharge plan established Outcome: Adequate for Discharge D/C tomorrow. Home with son and caregivers.  Goal: Vital signs remain stable Outcome: Adequate for Discharge VSS

## 2014-10-23 NOTE — Progress Notes (Signed)
Per NT patient with small bright red bloody stool. Pt denies abdominal pain. NP on call notified. New order placed. Will continue to monitor closely.

## 2014-10-23 NOTE — Progress Notes (Signed)
Physical Therapy Treatment Patient Details Name: INDIO SANTILLI MRN: [id number] DOB: Mar 11, 1928 Today's Date: 10/30/14    History of Present Illness 81 y.o. male with h/o dementia, CHF, cardiac pacemaker, peripheral neuropathy presents to ED 10/21/14 with slurred speech and generalized weakness. Patient had recent BP medication change.    PT Comments    Pt ambulated in hallway with encouragement.  Pt's caregiver (with pt M-F 9 am-7pm) reports pt usually requires some coaxing for mobility and does not like to use RW.  Pt likely close to his mobility baseline.  Follow Up Recommendations  Supervision/Assistance - 24 hour;No PT follow up     Equipment Recommendations  None recommended by PT    Recommendations for Other Services       Precautions / Restrictions Precautions Precautions: Fall    Mobility  Bed Mobility Overal bed mobility: Needs Assistance Bed Mobility: Supine to Sit;Sit to Supine     Supine to sit: Supervision Sit to supine: Supervision   General bed mobility comments: cues for intitiating  Transfers Overall transfer level: Needs assistance Equipment used: Rolling walker (2 wheeled) Transfers: Sit to/from Stand Sit to Stand: Min guard            Ambulation/Gait Ambulation/Gait assistance: Min guard Ambulation Distance (Feet): 200 Feet Assistive device: Rolling walker (2 wheeled) Gait Pattern/deviations: Step-through pattern;Decreased stride length     General Gait Details: slow, assist with turns, cues for safe use of RW   Stairs            Wheelchair Mobility    Modified Rankin (Stroke Patients Only)       Balance                                    Cognition Arousal/Alertness: Awake/alert Behavior During Therapy: WFL for tasks assessed/performed;Flat affect Overall Cognitive Status: History of cognitive impairments - at baseline                      Exercises      General Comments         Pertinent Vitals/Pain Pain Assessment: No/denies pain    Home Living                      Prior Function            PT Goals (current goals can now be found in the care plan section) Progress towards PT goals: Progressing toward goals    Frequency  Min 3X/week    PT Plan Current plan remains appropriate    Co-evaluation             End of Session Equipment Utilized During Treatment: Gait belt Activity Tolerance: Patient tolerated treatment well Patient left: with call bell/phone within reach;with family/visitor present;in bed;with bed alarm set     Time: 1045-1057 PT Time Calculation (min) (ACUTE ONLY): 12 min  Charges:  $Gait Training: 8-22 mins                    G Codes:      LEMYRE,KATHrine E 10/30/2014, 1:02 PM Zenovia Jarred, PT, DPT 2014-10-30 Pager: [phone]

## 2014-10-23 NOTE — Progress Notes (Signed)
Spoke with pt's son Dakota White via phone [phone] concerning Home with [facility]. Brett Canales declined at present time, states that someone is there 24/7. [facility] is there during the day and son is there at night with pt.

## 2014-10-23 NOTE — Progress Notes (Signed)
Hypoglycemic Event  CBG: 67    Treatment: 15 GM carbohydrate snack  Symptoms: None  Follow-up CBG: Time:0800 CBG Result:89  Possible Reasons for Event: Inadequate meal intake  Comments/MD notified:Dakota White     Burns, Dakota White  Remember to initiate Hypoglycemia Order Set & complete

## 2014-10-24 DIAGNOSIS — N189 Chronic kidney disease, unspecified: Secondary | ICD-10-CM

## 2014-10-24 DIAGNOSIS — K625 Hemorrhage of anus and rectum: Secondary | ICD-10-CM | POA: Diagnosis not present

## 2014-10-24 DIAGNOSIS — N179 Acute kidney failure, unspecified: Secondary | ICD-10-CM | POA: Diagnosis not present

## 2014-10-24 DIAGNOSIS — E1165 Type 2 diabetes mellitus with hyperglycemia: Secondary | ICD-10-CM

## 2014-10-24 DIAGNOSIS — R471 Dysarthria and anarthria: Secondary | ICD-10-CM | POA: Diagnosis not present

## 2014-10-24 LAB — BASIC METABOLIC PANEL
ANION GAP: 5 (ref 5–15)
BUN: 31 mg/dL — ABNORMAL HIGH (ref 6–20)
CALCIUM: 8.4 mg/dL — AB (ref 8.9–10.3)
CO2: 22 mmol/L (ref 22–32)
Chloride: 113 mmol/L — ABNORMAL HIGH (ref 101–111)
Creatinine, Ser: 1.49 mg/dL — ABNORMAL HIGH (ref 0.61–1.24)
GFR, EST AFRICAN AMERICAN: 48 mL/min — AB (ref >60)
GFR, EST NON AFRICAN AMERICAN: 41 mL/min — AB (ref >60)
Glucose, Bld: 85 mg/dL (ref 65–99)
POTASSIUM: 4.5 mmol/L (ref 3.5–5.1)
Sodium: 140 mmol/L (ref 135–145)

## 2014-10-24 LAB — CBC
HCT: 32.4 % — ABNORMAL LOW (ref 39.0–52.0)
Hemoglobin: 10.6 g/dL — ABNORMAL LOW (ref 13.0–17.0)
MCH: 31.5 pg (ref 26.0–34.0)
MCHC: 32.7 g/dL (ref 30.0–36.0)
MCV: 96.1 fL (ref 78.0–100.0)
PLATELETS: 125 10*3/uL — AB (ref 150–400)
RBC: 3.37 MIL/uL — AB (ref 4.22–5.81)
RDW: 14 % (ref 11.5–15.5)
WBC: 4.9 10*3/uL (ref 4.0–10.5)

## 2014-10-24 LAB — GLUCOSE, CAPILLARY
GLUCOSE-CAPILLARY: 177 mg/dL — AB (ref 65–99)
Glucose-Capillary: 172 mg/dL — ABNORMAL HIGH (ref 65–99)

## 2014-10-24 MED ORDER — CARVEDILOL 3.125 MG PO TABS
6.2500 mg | ORAL_TABLET | Freq: Two times a day (BID) | ORAL | Status: DC
Start: 2014-10-24 — End: 2015-01-06

## 2014-10-24 MED ORDER — INSULIN GLARGINE 100 UNIT/ML ~~LOC~~ SOLN: 50.0000 [IU] | Freq: Every morning | SUBCUTANEOUS | Status: DC

## 2014-10-24 NOTE — Discharge Summary (Signed)
Physician Discharge Summary  Dakota White RUE:[id number] DOB: July 15, 1928 DOA: 10/21/2014  PCP: Jules Husbands, ERIC, PA-C  Admit date: 10/21/2014 Discharge date: 10/24/2014  Time spent: Greater than 30 minutes  Recommendations for Outpatient Follow-up:  1. Carolynne Edouard, PA-C/PCP in 5 days with repeat labs (CBC & BMP) 2. Dr. Verdis Prime, cardiology on 10/28/14 at 11:30 AM  Discharge Diagnoses:  Principal Problem:   Dysarthria Active Problems:   Acute on chronic kidney failure   DM (diabetes mellitus), type 2, uncontrolled   Chronic kidney disease, stage III (moderate)   Rectal bleeding   Discharge Condition: Improved & Stable  Diet recommendation: Heart healthy diet.  Filed Weights   10/22/14 0528 10/24/14 1141  Weight: 76.2 kg (167 lb 15.9 oz) 78.7 kg (173 lb 8 oz)    History of present illness:  81 year old male with history of moderate dementia, lives with son, ambulates with the help of a walker, PMH of chronic diastolic heart failure progressing to new systolic heart failure (LVEF 35% by recent echo), DDD pacemaker, DM 2, stage III CKD, BPH and HTN, recently hospitalized 10/16/14-10/17/14 for nausea, vomiting & near syncope, readmitted on 10/21/14 to [facility] with transient slurred speech and noted to have acute kidney injury and creatinine of 2. Post recent discharge, seen by his cardiologist-amlodipine discontinued and newly started on low-dose carvedilol and ACEI with plans to follow-up in one week with repeat BMP.  Hospital Course:   Acute on stage III chronic kidney disease - Likely secondary to ACEI-discontinued >should not be used in the future - Treated with IV fluids and creatinine has gradually improved to 1.49. - Baseline creatinine probably in the 1.3-1.4 range.  Chronic diastolic CHF-new systolic CHF - Deterioration in LV function since prior echo with EF now down to 35-40 percent. Continue newly started carvedilol. ACEI discontinued secondary to acute kidney  injury. Discussed with Dr. Katrinka Blazing, cardiology: Recommended increasing carvedilol to 6.25 mg twice a day and avoid BiDil at this time.  Essential hypertension - Better controlled.  Third-degree heart block - Status post DDD pacemaker  DM 2 with renal complications - Transient hypoglycemia 9/21 morning. Possibly secondary to poor oral intake. Reduced Lantus dose. Monitor outpatient and can be adjusted.  Transient slurred speech - Possibly related to metabolic encephalopathy from acute kidney injury. Low index of suspicion for CVA or TIA. CT head negative for stroke. Unable to perform MRI secondary to pacemaker. Resolved and mental status at baseline as per son  Moderate dementia - Mental status at baseline now.  Mild rectal bleeding - Overnight report of small bright red bloody stool without any other complaints that occurred at approximately 7:45 PM on 9/21. No recurrence since. As per son, may have had colonoscopy a long time ago but cannot remember details. Rectal exam does not show any melena or blood at this time. - Patient's son was counseled that patient may follow-up with PCP to determine if he would be a candidate for any further aggressive evaluation i.e. colonoscopy. He was also advised to seek immediate medical attention if patient has recurrence of rectal bleeding and he verbalized understanding.  Anemia - Likely chronic. Hemoglobin after recent discharge was in the 11.9 g range. Admitting hemoglobin this time was 15 g which was probably elevated due to hemoconcentration from dehydration. Hemoglobin today is 10.6. Recommend repeating CBCs in a few days during outpatient follow-up.  Thrombocytopenia - Unclear etiology. Seems to have had chronic intermittent mild thrombocytopenia in the past. Follow CBCs in a few  days.    Consultants:  None  Procedures:  None  Antibiotics:  None  Discharge Exam:  Complaints: Overnight events noted. Mild blood in stools last night.  Patient denies abdominal pain. No further BM since.  Filed Vitals:   10/23/14 2111 10/24/14 0542 10/24/14 1141 10/24/14 1433  BP: 160/58 129/57  126/54  Pulse: 65 61  61  Temp: 98.7 F (37.1 C) 97.5 F (36.4 C)  98.7 F (37.1 C)  TempSrc: Oral Oral  Oral  Resp: Height:      Weight:   78.7 kg (173 lb 8 oz)   SpO2: 97% 97%  96%    General exam: Pleasant elderly male sitting up in bed comfortably. Respiratory system: Clear. No increased work of breathing. Cardiovascular system: S1 & S2 heard, RRR. No JVD, murmurs, gallops, clicks or pedal edema. Telemetry: AV paced rhythm. Gastrointestinal system: Abdomen is nondistended, soft and nontender. Normal bowel sounds heard. Central nervous system: Alert and oriented. No focal neurological deficits. Extremities: Symmetric 5 x 5 power.  Discharge Instructions      Discharge Instructions    Call MD for:  difficulty breathing, headache or visual disturbances    Complete by:  As directed      Call MD for:  extreme fatigue    Complete by:  As directed      Call MD for:  hives    Complete by:  As directed      Call MD for:  persistant dizziness or light-headedness    Complete by:  As directed      Call MD for:  persistant nausea and vomiting    Complete by:  As directed      Call MD for:  severe uncontrolled pain    Complete by:  As directed      Call MD for:  temperature >100.4    Complete by:  As directed      Call MD for:    Complete by:  As directed   Confusion/altered mental state. Rectal bleeding.     Diet - low sodium heart healthy    Complete by:  As directed      Diet Carb Modified    Complete by:  As directed      Increase activity slowly    Complete by:  As directed             Medication List    TAKE these medications        aspirin EC 81 MG tablet  Take 81 mg by mouth at bedtime.     carvedilol 3.125 MG tablet  Commonly known as:  COREG  Take 2 tablets (6.25 mg total) by mouth 2 (two) times  daily.     donepezil 10 MG tablet  Commonly known as:  ARICEPT  Take 1 tablet (10 mg total) by mouth at bedtime.     finasteride 5 MG tablet  Commonly known as:  PROSCAR  TAKE 1 TABLET BY MOUTH EVERY DAY     glucose blood test strip  Once daily     insulin glargine 100 UNIT/ML injection  Commonly known as:  LANTUS  Inject 0.5 mLs (50 Units total) into the skin every morning.     memantine 10 MG tablet  Commonly known as:  NAMENDA  Take 1 tablet (10 mg total) by mouth at bedtime.     mirtazapine 15 MG tablet  Commonly known as:  REMERON  Take 7.5 mg by  mouth at bedtime.     ONETOUCH DELICA LANCETS 33G Misc  1 Device by Does not apply route 4 (four) times daily as needed.     rosuvastatin 5 MG tablet  Commonly known as:  CRESTOR  Take 1 tablet (5 mg total) by mouth at bedtime.     sertraline 100 MG tablet  Commonly known as:  ZOLOFT  Take 1 tablet (100 mg total) by mouth at bedtime.       Follow-up Information    Follow up with DYER, ERIC, PA-C. Schedule an appointment as soon as possible for a visit in 5 days.   Specialty:  Physician Assistant   Why:  to be seen with repeat labs (CBC & BMP)   Contact information:   [address] [place] [address] [phone]       Follow up with Lesleigh Noe, MD. Go on 10/28/2014.   Specialty:  Cardiology   Why:  at 11:30am   For Post Hospitalization Follow Up with Norma Fredrickson   Contact information:   [address] [place] [address] [phone]        The results of significant diagnostics from this hospitalization (including imaging, microbiology, ancillary and laboratory) are listed below for reference.    Significant Diagnostic Studies: Dg Chest 2 View  10/16/2014   CLINICAL DATA:  Nausea, vomiting and difficulty breathing for 3 days.  EXAM: CHEST - 2 VIEW; ABDOMEN - 2 VIEW  COMPARISON:  02/01/2012 chest x-ray.  FINDINGS: Chest x-ray:  The cardiac silhouette,  mediastinal and hilar contours are normal. The pacer wires are stable. The lungs are clear. Stable eventration of the right hemidiaphragm.  Abdomen:  The bowel gas pattern is unremarkable. There is scattered air and stool throughout the colon and down into the rectum. There are some scattered air-filled small bowel loops but no distention or air-fluid levels. The soft tissue shadows are maintained. No worrisome calcifications. The bony structures are intact.  IMPRESSION: 1. No acute cardiopulmonary findings. 2. No plain film findings for an acute abdominal process.   Electronically Signed   By: Rudie Meyer M.D.   On: 10/16/2014 15:23   Ct Head Wo Contrast  10/22/2014   CLINICAL DATA:  Slurred speech, abnormal gait, generalized weakness  EXAM: CT HEAD WITHOUT CONTRAST  TECHNIQUE: Contiguous axial images were obtained from the base of the skull through the vertex without intravenous contrast.  COMPARISON:  05/07/2009  FINDINGS: No evidence of parenchymal hemorrhage or extra-axial fluid collection. No mass lesion, mass effect, or midline shift.  No CT evidence of acute infarction.  Subcortical white matter and periventricular small vessel ischemic changes. Mild intracranial atherosclerosis.  Age related atrophy.  No ventriculomegaly.  The visualized paranasal sinuses are essentially clear. The mastoid air cells are unopacified.  No evidence of calvarial fracture.  IMPRESSION: No evidence of acute intracranial abnormality.  Small vessel ischemic changes.   Electronically Signed   By: Charline Bills M.D.   On: 10/22/2014 02:01   Dg Abd 2 Views  10/16/2014   CLINICAL DATA:  Nausea, vomiting and difficulty breathing for 3 days.  EXAM: CHEST - 2 VIEW; ABDOMEN - 2 VIEW  COMPARISON:  02/01/2012 chest x-ray.  FINDINGS: Chest x-ray:  The cardiac silhouette, mediastinal and hilar contours are normal. The pacer wires are stable. The lungs are clear. Stable eventration of the right hemidiaphragm.  Abdomen:  The bowel  gas pattern is unremarkable. There is scattered air and stool throughout the  colon and down into the rectum. There are some scattered air-filled small bowel loops but no distention or air-fluid levels. The soft tissue shadows are maintained. No worrisome calcifications. The bony structures are intact.  IMPRESSION: 1. No acute cardiopulmonary findings. 2. No plain film findings for an acute abdominal process.   Electronically Signed   By: Rudie Meyer M.D.   On: 10/16/2014 15:23    Microbiology: No results found for this or any previous visit (from the past 240 hour(s)).   Labs: Basic Metabolic Panel:  Recent Labs Lab 10/21/14 1014 10/21/14 2223 10/22/14 0501 10/23/14 0820 10/24/14 0437  NA 134* 134* 134* 136 140  K 5.0 4.8 4.6 4.5 4.5  CL 100 104 104 108 113*  CO2 22 21* 20* 22 22  GLUCOSE 175* 189* 169* 87 85  BUN 23 32* 32* 33* 31*  CREATININE 1.36* 2.00* 1.71* 1.64* 1.49*  CALCIUM 10.4* 10.2 9.2 8.8* 8.4*   Liver Function Tests:  Recent Labs Lab 10/21/14 1014 10/21/14 2223  AST 21 26  ALT 20 21  ALKPHOS 63 54  BILITOT 0.7 0.7  PROT 8.0 7.7  ALBUMIN 4.6 4.2   No results for input(s): LIPASE, AMYLASE in the last 168 hours. No results for input(s): AMMONIA in the last 168 hours. CBC:  Recent Labs Lab 10/21/14 1014 10/24/14 0437  WBC 9.2 4.9  HGB 15.1 10.6*  HCT 44.8 32.4*  MCV 92.9 96.1  PLT 190 125*   Cardiac Enzymes: No results for input(s): CKTOTAL, CKMB, CKMBINDEX, TROPONINI in the last 168 hours. BNP: BNP (last 3 results) No results for input(s): BNP in the last 8760 hours.  ProBNP (last 3 results) No results for input(s): PROBNP in the last 8760 hours.  CBG:  Recent Labs Lab 10/23/14 1158 10/23/14 1816 10/23/14 2208 10/24/14 0940 10/24/14 1142  GLUCAP 160* 171* 129* 172* 177*      Signed:  Marcellus Scott, MD, FACP, FHM. [facility] Pager [phone]  If 7PM-7AM, please contact night-coverage [email or web address] Password  [facility] 10/24/2014, 5:56 PM

## 2014-10-24 NOTE — Progress Notes (Signed)
Pt discharged per MD order. Educated pt and caregiver on follow up appointments and medication instructions. Answered all pt and caregiver questions. Caregiver verbalized understanding. All IV's and telemetry removed from pt. Pt taken off unit by wheelchair.

## 2014-10-28 ENCOUNTER — Encounter: Payer: Self-pay | Admitting: Nurse Practitioner

## 2014-10-28 ENCOUNTER — Ambulatory Visit (INDEPENDENT_AMBULATORY_CARE_PROVIDER_SITE_OTHER): Payer: Medicare Other | Admitting: Nurse Practitioner

## 2014-10-28 ENCOUNTER — Other Ambulatory Visit: Payer: Medicare Other

## 2014-10-28 VITALS — BP 130/60 | HR 67 | Ht 71.0 in | Wt 165.8 lb

## 2014-10-28 DIAGNOSIS — I10 Essential (primary) hypertension: Secondary | ICD-10-CM

## 2014-10-28 DIAGNOSIS — I5041 Acute combined systolic (congestive) and diastolic (congestive) heart failure: Secondary | ICD-10-CM | POA: Diagnosis not present

## 2014-10-28 DIAGNOSIS — Z950 Presence of cardiac pacemaker: Secondary | ICD-10-CM | POA: Diagnosis not present

## 2014-10-28 LAB — CBC
HCT: 40 % (ref 39.0–52.0)
Hemoglobin: 13.2 g/dL (ref 13.0–17.0)
MCHC: 32.9 g/dL (ref 30.0–36.0)
MCV: 94 fl (ref 78.0–100.0)
Platelets: 142 10*3/uL — ABNORMAL LOW (ref 150.0–400.0)
RBC: 4.26 Mil/uL (ref 4.22–5.81)
RDW: 14.1 % (ref 11.5–15.5)
WBC: 6.1 10*3/uL (ref 4.0–10.5)

## 2014-10-28 NOTE — Progress Notes (Signed)
CARDIOLOGY OFFICE NOTE  Date:  10/28/2014    [name] Date of Birth: October 31, 1928 Medical Record #[id number]  PCP:  Mickie Hillier, MD  Cardiologist:  Katrinka Blazing and Ladona Ridgel    Chief Complaint  Patient presents with  . Shortness of Breath    Follow up visit - seen for Dr. Katrinka Blazing    History of Present Illness: Dakota White is a 81 y.o. male who presents today for a follow up visit. Seen for Dr. Katrinka Blazing and Ladona Ridgel. He has chronic diastolic heart failure progressing to systolic heart failure (LVEF 35% by recent echo), and DDD pacemaker, dementia, type 2 diabetes, and essential hypertension.  Patient was recently admitted earlier this month with nausea and vomiting. No specific cardiac abnormalities were found. He did have evaluation because there was near syncope associated with nausea and vomiting. The workup identified new reduction in LV systolic function with EF 35-40%. This is in comparison with an EF estimated at 60-65% in 2013 around the time of pacemaker insertion.  Seen here in the office about 10 days ago - felt to be doing ok. Low dose ACE and beta blocker were started.   Ended up going to the ER the night after his visit here - had some slurred speech. Noted to have a creatinine of 2.0 (I wonder if this was on an Istat - I cannot see this on any lab from that admission - looks like his creatinine was stable in the 1.3 to 1.5 range. Nevertheless, ACE was stopped. Coreg was increased. Negative CT of the head. No MRI due to his PPM in place.   Comes back today. Here with his daughter [name]. Has sitters that assist with his care. Lives with his son & nephew. Daughter manages money and health issues. [facility] but due to the recent 2 admissions - now needs a letter to go back - he has really enjoyed this program. [name] feels like he is doing ok. No reports of shortness of breath. No swelling. They do note some fatigue. This seems like it may have  gotten more progressive. Now on higher dose of Coreg. Seems to be fairly compliant with his medicines but [name] admits that his medicines goes better when they are just once a day. Needs labs rechecked today.   Past Medical History  Diagnosis Date  . Type II or unspecified type diabetes mellitus without mention of complication, uncontrolled   . Essential hypertension, benign   . Mental disorder   . Dementia   . Peripheral neuropathy   . Allergic rhinitis   . Asthma with bronchitis   . Complete heart block     Requiring permanent [employer] DDD pacemaker by Dr. Ladona Ridgel  . Peripheral vascular disease, unspecified   . Cardiac pacemaker in situ     Normal function  . History of echocardiogram 01/2012    EF 60-65%   . Mixed hyperlipidemia     Past Surgical History  Procedure Laterality Date  . Rotator cuff repair    . Pacemaker insertion  Dec 2013  . Permanent pacemaker insertion N/A 01/31/2012    Procedure: PERMANENT PACEMAKER INSERTION;  Surgeon: Marinus Maw, MD;  Location: [facility] CATH LAB;  Service: Cardiovascular;  Laterality: N/A;     Medications: Current Outpatient Prescriptions  Medication Sig Dispense Refill  . aspirin EC 81 MG tablet Take 81 mg by mouth at bedtime.    . carvedilol (COREG) 3.125 MG  tablet Take 2 tablets (6.25 mg total) by mouth 2 (two) times daily. 60 tablet 0  . donepezil (ARICEPT) 10 MG tablet Take 1 tablet (10 mg total) by mouth at bedtime. 30 tablet 11  . finasteride (PROSCAR) 5 MG tablet TAKE 1 TABLET BY MOUTH EVERY DAY 60 tablet 1  . glucose blood test strip Once daily    . insulin glargine (LANTUS) 100 UNIT/ML injection Inject 0.5 mLs (50 Units total) into the skin every morning.    . memantine (NAMENDA) 10 MG tablet Take 1 tablet (10 mg total) by mouth at bedtime. 30 tablet 2  . mirtazapine (REMERON) 15 MG tablet Take 7.5 mg by mouth at bedtime.    [name] DELICA LANCETS 33G MISC 1 Device by Does not apply route 4 (four) times daily as needed. 200  each 11  . rosuvastatin (CRESTOR) 5 MG tablet Take 1 tablet (5 mg total) by mouth at bedtime. 30 tablet 11  . sertraline (ZOLOFT) 100 MG tablet Take 1 tablet (100 mg total) by mouth at bedtime. 30 tablet 11   No current facility-administered medications for this visit.    Allergies: Allergies  Allergen Reactions  . Actos [Pioglitazone]     "makes me feel lousy and makes me bleed"  . Ramipril     "pt not aware of allergy"    Social History: The patient  reports that he has quit smoking. His smoking use included Cigarettes. He has never used smokeless tobacco. He reports that he does not drink alcohol or use illicit drugs.   Family History: The patient's family history includes Alzheimer's disease in his father and sister; COPD in his brother; Diabetes in his sister; Heart disease in his mother.   Review of Systems: Please see the history of present illness.   Otherwise, the review of systems is positive for none.   All other systems are reviewed and negative.   Physical Exam: VS:  BP 130/60 mmHg  Pulse 67  Ht  (1.803 m)  Wt 165 lb 12.8 oz (75.206 kg)  BMI 23.13 kg/m2  SpO2 100% .  BMI Body mass index is 23.13 kg/(m^2).  Wt Readings from Last 3 Encounters:  10/28/14 165 lb 12.8 oz (75.206 kg)  10/24/14 173 lb 8 oz (78.7 kg)  10/21/14 168 lb 1.9 oz (76.259 kg)    General: Pleasant. He is thin. Little confused but in no acute distress.  HEENT: Normal. Neck: Supple, no JVD, carotid bruits, or masses noted.  Cardiac: Regular rate and rhythm. No murmurs, rubs, or gallops. No edema.  Respiratory:  Lungs are clear to auscultation bilaterally with normal work of breathing.  GI: Soft and nontender.  MS: No deformity or atrophy. Gait and ROM intact. He is using a walker.  Skin: Warm and dry. Color is normal.  Neuro:  Strength and sensation are intact and no gross focal deficits noted.  Psych: Alert, appropriate and with normal affect.   LABORATORY DATA:  EKG:  EKG is  not ordered today.   Lab Results  Component Value Date   WBC 4.9 10/24/2014   HGB 10.6* 10/24/2014   HCT 32.4* 10/24/2014   PLT 125* 10/24/2014   GLUCOSE 85 10/24/2014   ALT 21 10/21/2014   AST 26 10/21/2014   NA 140 10/24/2014   K 4.5 10/24/2014   CL 113* 10/24/2014   CREATININE 1.49* 10/24/2014   BUN 31* 10/24/2014   CO2 22 10/24/2014   TSH 3.281 10/16/2014   INR 1.04 10/22/2014  HGBA1C 8.5 06/13/2013    BNP (last 3 results) No results for input(s): BNP in the last 8760 hours.  ProBNP (last 3 results) No results for input(s): PROBNP in the last 8760 hours.   Other Studies Reviewed Today:  Echo Study Conclusions from 10/2014  - Left ventricle: The cavity size was normal. There was mild concentric hypertrophy. Systolic function was moderately reduced. The estimated ejection fraction was in the range of 35% to 40%. Moderate hypokinesis of the mid-apicalanteroseptal and anterior myocardium. Hypokinesis of the inferior myocardium. Doppler parameters are consistent with abnormal left ventricular relaxation (grade 1 diastolic dysfunction). - Mitral valve: Mildly calcified annulus. There was mild regurgitation.  Assessment/Plan: 1. Chronic systolic & diastolic heart failure - new systolic heart failure (asymptomatic) Deterioration in LV function since the prior echo with EF now down to 35-40%. This may be due to chronic RV pacing. No longer on Norvasc. Only got one dose of ACE - then stopped due to creatinine of 2 - recheck his labs today. Coreg increased at his last admission. I would favor leaving him on his current regimen and manage conservatively.  2. Third degree heart block Treated with DDD pacemaker - seeing Dr. Ladona Ridgel next month.  3. DM (diabetes mellitus), type 2, uncontrolled Followed by primary care  4. Chronic kidney disease, stage III  5 Essential hypertension Well controlled.  6. Dementia - ok to [facility].  Current medicines are reviewed with the patient today.  The patient does not have concerns regarding medicines other than what has been noted above.  The following changes have been made:  See above.  Labs/ tests ordered today include:    Orders Placed This Encounter  Procedures  . Basic metabolic panel  . CBC     Disposition:   FU with Dr. Katrinka Blazing and Dr. Ladona Ridgel as planned.    Patient is agreeable to this plan and will call if any problems develop in the interim.   Signed: Rosalio Macadamia, RN, ANP-C 10/28/2014 12:30 PM  [facility] [address] [place], [place]  [address] Phone: [phone] Fax: [phone]

## 2014-10-28 NOTE — Patient Instructions (Addendum)
We will be checking the following labs today - BMET and CBC   Medication Instructions:    Continue with your current medicines.   Let us know if you want a RX for the 6.25 mg of Coreg - we can send that in for you    Testing/Procedures To Be Arranged:  N/A  Follow-Up:   See me in 6 to 8 weeks    Other Special Instructions:   Try to limit his salt as best as you can  Keep an eye out for symptoms - swelling, short of breath, etc.   Ok [facility]  Call the [facility] office at [phone] if you have any questions, problems or concerns.

## 2014-10-30 ENCOUNTER — Other Ambulatory Visit: Payer: Medicare Other

## 2014-10-30 ENCOUNTER — Telehealth: Payer: Self-pay

## 2014-10-30 ENCOUNTER — Other Ambulatory Visit: Payer: Self-pay | Admitting: *Deleted

## 2014-10-30 ENCOUNTER — Other Ambulatory Visit (INDEPENDENT_AMBULATORY_CARE_PROVIDER_SITE_OTHER): Payer: Medicare Other | Admitting: *Deleted

## 2014-10-30 DIAGNOSIS — I10 Essential (primary) hypertension: Secondary | ICD-10-CM | POA: Diagnosis not present

## 2014-10-30 DIAGNOSIS — N183 Chronic kidney disease, stage 3 unspecified: Secondary | ICD-10-CM

## 2014-10-30 DIAGNOSIS — I5023 Acute on chronic systolic (congestive) heart failure: Secondary | ICD-10-CM

## 2014-10-30 DIAGNOSIS — Z950 Presence of cardiac pacemaker: Secondary | ICD-10-CM | POA: Diagnosis not present

## 2014-10-30 DIAGNOSIS — I5041 Acute combined systolic (congestive) and diastolic (congestive) heart failure: Secondary | ICD-10-CM | POA: Diagnosis not present

## 2014-10-30 NOTE — Telephone Encounter (Signed)
-----   Message from Rosalio Macadamia, NP sent at 10/29/2014  3:50 PM EDT ----- Ok to report to his daughter [name]. Waiting on BMET - can we see where this went??? CBC is ok.

## 2014-10-30 NOTE — Telephone Encounter (Signed)
Pt aware need to get another lab draw. Orders placed and lab appt made for today 9/28

## 2014-10-30 NOTE — Addendum Note (Signed)
Addended by: Rosalio Macadamia on: 10/30/2014 11:14 AM   Modules accepted: Orders

## 2014-10-30 NOTE — Telephone Encounter (Signed)
Called and spoke with patient's daughter, Darshawn Boateng, and notified her of CBC results and that there was not enough blood drawn for BMP on 10/28/2014.  Informed her that Norma Fredrickson, NP requests that patient come back to have BMP redrawn.  Verbalizes understanding, and states that the patient's son Treysen Sudbeck) or caregiver Lily Lovings) will bring resident to lab today - 10/30/2014.

## 2014-10-31 ENCOUNTER — Emergency Department (HOSPITAL_COMMUNITY): Payer: Medicare Other

## 2014-10-31 ENCOUNTER — Encounter (HOSPITAL_COMMUNITY): Payer: Self-pay | Admitting: Emergency Medicine

## 2014-10-31 ENCOUNTER — Inpatient Hospital Stay (HOSPITAL_COMMUNITY)
Admission: EM | Admit: 2014-10-31 | Discharge: 2014-11-04 | DRG: 690 | Disposition: A | Discharge status: Home / Self Care | Payer: Medicare Other | Attending: Internal Medicine | Admitting: Internal Medicine

## 2014-10-31 DIAGNOSIS — L8991 Pressure ulcer of unspecified site, stage 1: Secondary | ICD-10-CM | POA: Diagnosis present

## 2014-10-31 DIAGNOSIS — I130 Hypertensive heart and chronic kidney disease with heart failure and stage 1 through stage 4 chronic kidney disease, or unspecified chronic kidney disease: Secondary | ICD-10-CM | POA: Diagnosis present

## 2014-10-31 DIAGNOSIS — N183 Chronic kidney disease, stage 3 unspecified: Secondary | ICD-10-CM | POA: Diagnosis present

## 2014-10-31 DIAGNOSIS — N179 Acute kidney failure, unspecified: Secondary | ICD-10-CM | POA: Diagnosis present

## 2014-10-31 DIAGNOSIS — F0390 Unspecified dementia without behavioral disturbance: Secondary | ICD-10-CM | POA: Diagnosis present

## 2014-10-31 DIAGNOSIS — D696 Thrombocytopenia, unspecified: Secondary | ICD-10-CM | POA: Diagnosis not present

## 2014-10-31 DIAGNOSIS — E872 Acidosis, unspecified: Secondary | ICD-10-CM

## 2014-10-31 DIAGNOSIS — A419 Sepsis, unspecified organism: Secondary | ICD-10-CM | POA: Diagnosis not present

## 2014-10-31 DIAGNOSIS — J45909 Unspecified asthma, uncomplicated: Secondary | ICD-10-CM | POA: Diagnosis present

## 2014-10-31 DIAGNOSIS — Z79899 Other long term (current) drug therapy: Secondary | ICD-10-CM

## 2014-10-31 DIAGNOSIS — E861 Hypovolemia: Secondary | ICD-10-CM | POA: Diagnosis present

## 2014-10-31 DIAGNOSIS — G629 Polyneuropathy, unspecified: Secondary | ICD-10-CM | POA: Diagnosis present

## 2014-10-31 DIAGNOSIS — I739 Peripheral vascular disease, unspecified: Secondary | ICD-10-CM | POA: Diagnosis present

## 2014-10-31 DIAGNOSIS — N390 Urinary tract infection, site not specified: Principal | ICD-10-CM | POA: Diagnosis present

## 2014-10-31 DIAGNOSIS — IMO0002 Reserved for concepts with insufficient information to code with codable children: Secondary | ICD-10-CM | POA: Diagnosis present

## 2014-10-31 DIAGNOSIS — Z87891 Personal history of nicotine dependence: Secondary | ICD-10-CM

## 2014-10-31 DIAGNOSIS — D631 Anemia in chronic kidney disease: Secondary | ICD-10-CM | POA: Diagnosis present

## 2014-10-31 DIAGNOSIS — Z888 Allergy status to other drugs, medicaments and biological substances status: Secondary | ICD-10-CM

## 2014-10-31 DIAGNOSIS — Z794 Long term (current) use of insulin: Secondary | ICD-10-CM

## 2014-10-31 DIAGNOSIS — D638 Anemia in other chronic diseases classified elsewhere: Secondary | ICD-10-CM | POA: Diagnosis present

## 2014-10-31 DIAGNOSIS — I442 Atrioventricular block, complete: Secondary | ICD-10-CM | POA: Diagnosis present

## 2014-10-31 DIAGNOSIS — E1122 Type 2 diabetes mellitus with diabetic chronic kidney disease: Secondary | ICD-10-CM | POA: Diagnosis present

## 2014-10-31 DIAGNOSIS — R0602 Shortness of breath: Secondary | ICD-10-CM | POA: Diagnosis not present

## 2014-10-31 DIAGNOSIS — E782 Mixed hyperlipidemia: Secondary | ICD-10-CM | POA: Diagnosis present

## 2014-10-31 DIAGNOSIS — I10 Essential (primary) hypertension: Secondary | ICD-10-CM | POA: Diagnosis present

## 2014-10-31 DIAGNOSIS — N400 Enlarged prostate without lower urinary tract symptoms: Secondary | ICD-10-CM | POA: Diagnosis present

## 2014-10-31 DIAGNOSIS — N189 Chronic kidney disease, unspecified: Secondary | ICD-10-CM

## 2014-10-31 DIAGNOSIS — I5042 Chronic combined systolic (congestive) and diastolic (congestive) heart failure: Secondary | ICD-10-CM | POA: Diagnosis present

## 2014-10-31 DIAGNOSIS — Z7982 Long term (current) use of aspirin: Secondary | ICD-10-CM

## 2014-10-31 DIAGNOSIS — B961 Klebsiella pneumoniae [K. pneumoniae] as the cause of diseases classified elsewhere: Secondary | ICD-10-CM | POA: Diagnosis present

## 2014-10-31 DIAGNOSIS — F0391 Unspecified dementia with behavioral disturbance: Secondary | ICD-10-CM | POA: Diagnosis not present

## 2014-10-31 DIAGNOSIS — E860 Dehydration: Secondary | ICD-10-CM | POA: Diagnosis present

## 2014-10-31 DIAGNOSIS — E1165 Type 2 diabetes mellitus with hyperglycemia: Secondary | ICD-10-CM | POA: Diagnosis present

## 2014-10-31 DIAGNOSIS — E1129 Type 2 diabetes mellitus with other diabetic kidney complication: Secondary | ICD-10-CM | POA: Diagnosis present

## 2014-10-31 DIAGNOSIS — Z950 Presence of cardiac pacemaker: Secondary | ICD-10-CM | POA: Diagnosis present

## 2014-10-31 DIAGNOSIS — R531 Weakness: Secondary | ICD-10-CM

## 2014-10-31 LAB — URINALYSIS, ROUTINE W REFLEX MICROSCOPIC
Bilirubin Urine: NEGATIVE
GLUCOSE, UA: NEGATIVE mg/dL
Ketones, ur: NEGATIVE mg/dL
NITRITE: POSITIVE — AB
PH: 6.5 (ref 5.0–8.0)
PROTEIN: 100 mg/dL — AB
Specific Gravity, Urine: 1.013 (ref 1.005–1.030)
Urobilinogen, UA: 1 mg/dL (ref 0.0–1.0)

## 2014-10-31 LAB — BASIC METABOLIC PANEL
ANION GAP: 8 (ref 5–15)
BUN/Creatinine Ratio: 16 (ref 10–22)
BUN: 28 mg/dL — ABNORMAL HIGH (ref 8–27)
BUN: 35 mg/dL — ABNORMAL HIGH (ref 6–20)
CHLORIDE: 105 mmol/L (ref 101–111)
CO2: 15 mmol/L — ABNORMAL LOW (ref 18–29)
CO2: 22 mmol/L (ref 22–32)
Calcium: 9.2 mg/dL (ref 8.6–10.2)
Calcium: 8.9 mg/dL (ref 8.9–10.3)
Chloride: 104 mmol/L (ref 97–108)
Creatinine, Ser: 1.77 mg/dL — ABNORMAL HIGH (ref 0.76–1.27)
Creatinine, Ser: 1.85 mg/dL — ABNORMAL HIGH (ref 0.61–1.24)
GFR calc Af Amer: 40 mL/min/{1.73_m2} — ABNORMAL LOW (ref >59)
GFR calc Af Amer: 37 mL/min — ABNORMAL LOW (ref >60)
GFR calc non Af Amer: 35 mL/min/{1.73_m2} — ABNORMAL LOW (ref >59)
GFR calc non Af Amer: 32 mL/min — ABNORMAL LOW (ref >60)
GLUCOSE: 183 mg/dL — AB (ref 65–99)
Glucose: 60 mg/dL — ABNORMAL LOW (ref 65–99)
POTASSIUM: 4.7 mmol/L (ref 3.5–5.1)
Potassium: 5.8 mmol/L — ABNORMAL HIGH (ref 3.5–5.2)
Sodium: 140 mmol/L (ref 134–144)
Sodium: 135 mmol/L (ref 135–145)

## 2014-10-31 LAB — URINE MICROSCOPIC-ADD ON

## 2014-10-31 LAB — CBC
HEMATOCRIT: 39.5 % (ref 39.0–52.0)
HEMOGLOBIN: 13.1 g/dL (ref 13.0–17.0)
MCH: 31.1 pg (ref 26.0–34.0)
MCHC: 33.2 g/dL (ref 30.0–36.0)
MCV: 93.8 fL (ref 78.0–100.0)
Platelets: 97 10*3/uL — ABNORMAL LOW (ref 150–400)
RBC: 4.21 MIL/uL — ABNORMAL LOW (ref 4.22–5.81)
RDW: 13.8 % (ref 11.5–15.5)
WBC: 5.7 10*3/uL (ref 4.0–10.5)

## 2014-10-31 LAB — I-STAT CG4 LACTIC ACID, ED: LACTIC ACID, VENOUS: 2.05 mmol/L — AB (ref 0.5–2.0)

## 2014-10-31 LAB — I-STAT TROPONIN, ED: Troponin i, poc: 0.02 ng/mL (ref 0.00–0.08)

## 2014-10-31 LAB — CBG MONITORING, ED: GLUCOSE-CAPILLARY: 176 mg/dL — AB (ref 65–99)

## 2014-10-31 LAB — LIPASE, BLOOD: Lipase: 30 U/L (ref 22–51)

## 2014-10-31 MED ORDER — DEXTROSE 5 % IV SOLN
1.0000 g | Freq: Once | INTRAVENOUS | Status: AC
  Administered 2014-10-31: 1 g via INTRAVENOUS
  Filled 2014-10-31: qty 10

## 2014-10-31 MED ORDER — ACETAMINOPHEN 500 MG PO TABS
1000.0000 mg | ORAL_TABLET | Freq: Once | ORAL | Status: AC
  Administered 2014-10-31: 1000 mg via ORAL
  Filled 2014-10-31: qty 2

## 2014-10-31 MED ORDER — SODIUM CHLORIDE 0.9 % IV BOLUS (SEPSIS)
500.0000 mL | Freq: Once | INTRAVENOUS | Status: AC
  Administered 2014-10-31: 500 mL via INTRAVENOUS

## 2014-10-31 NOTE — ED Notes (Signed)
Family states pt has recently been diagnosed with heart failure and renal failure  Family states pt's condition has declined over the past week or so  Today pt is spitting up bile colored liquid and complaining of shortness of breath and is much weaker  Family states it was a struggle to even get him in the car to come here

## 2014-10-31 NOTE — ED Provider Notes (Signed)
CSN: [id number]     Arrival date & time 10/31/14  2029 History   First MD Initiated Contact with Patient 10/31/14 2040     Chief Complaint  Patient presents with  . Shortness of Breath  . Emesis     (Consider location/radiation/quality/duration/timing/severity/associated sxs/prior Treatment) HPI Comments: 81 y.o. Male with history of DM, HTN, pacemaker, multiple recent admissions presents for weakness, shortness of breath.  The patient's son who is his primary caregiver stated that the patient just appeared very ill this evening.  He said that tonight his father was too weak to transfer or use the walker as he usually does and that he noted his father was spitting up what looked like mucus.  He has not had a known fever.  The patient says that his father has been very sick especially recently and little things seem to tip him over all of the sudden.  The patient's son reports that his most recent biggest issues have been managing his heart failure and renal failure.  The patient is demented and not able to contribute to the history.  Patient is a 81 y.o. male presenting with shortness of breath and vomiting.  Shortness of Breath Emesis   Past Medical History  Diagnosis Date  . Type II or unspecified type diabetes mellitus without mention of complication, uncontrolled   . Essential hypertension, benign   . Mental disorder   . Dementia   . Peripheral neuropathy   . Allergic rhinitis   . Asthma with bronchitis   . Complete heart block     Requiring permanent St. Jude DDD pacemaker by Dr. Ladona Ridgel  . Peripheral vascular disease, unspecified   . Cardiac pacemaker in situ     Normal function  . History of echocardiogram 01/2012    EF 60-65%   . Mixed hyperlipidemia    Past Surgical History  Procedure Laterality Date  . Rotator cuff repair    . Pacemaker insertion  Dec 2013  . Permanent pacemaker insertion N/A 01/31/2012    Procedure: PERMANENT PACEMAKER INSERTION;  Surgeon: Marinus Maw, MD;  Location: [facility] CATH LAB;  Service: Cardiovascular;  Laterality: N/A;   Family History  Problem Relation Age of Onset  . Heart disease Mother   . Alzheimer's disease Father   . Diabetes Sister   . COPD Brother   . Alzheimer's disease Sister    Social History  Substance Use Topics  . Smoking status: Former Smoker    Types: Cigarettes  . Smokeless tobacco: Never Used  . Alcohol Use: No    Review of Systems  Unable to perform ROS: Dementia  Respiratory: Positive for shortness of breath.       Allergies  Actos and Ramipril  Home Medications   Prior to Admission medications   Medication Sig Start Date End Date Taking? Authorizing Provider  aspirin EC 81 MG tablet Take 81 mg by mouth at bedtime.   Yes Historical Provider, MD  carvedilol (COREG) 3.125 MG tablet Take 2 tablets (6.25 mg total) by mouth 2 (two) times daily. 10/24/14  Yes Elease Etienne, MD  donepezil (ARICEPT) 10 MG tablet Take 1 tablet (10 mg total) by mouth at bedtime. 02/19/13  Yes Elenora Gamma, MD  finasteride (PROSCAR) 5 MG tablet TAKE 1 TABLET BY MOUTH EVERY DAY   Yes Elenora Gamma, MD  insulin glargine (LANTUS) 100 UNIT/ML injection Inject 0.5 mLs (50 Units total) into the skin every morning. 10/24/14  Yes Elease Etienne, MD  memantine (NAMENDA) 10 MG tablet Take 1 tablet (10 mg total) by mouth at bedtime. 10/21/14  Yes Latrelle Dodrill, MD  mirtazapine (REMERON) 15 MG tablet Take 7.5 mg by mouth at bedtime.   Yes Historical Provider, MD  rosuvastatin (CRESTOR) 5 MG tablet Take 1 tablet (5 mg total) by mouth at bedtime. 02/19/13  Yes Elenora Gamma, MD  sertraline (ZOLOFT) 100 MG tablet Take 1 tablet (100 mg total) by mouth at bedtime. 02/19/13  Yes Elenora Gamma, MD  glucose blood test strip Once daily 04/16/13   Elenora Gamma, MD  [facility] DELICA LANCETS 33G MISC 1 Device by Does not apply route 4 (four) times daily as needed. 02/19/13   Elenora Gamma, MD   BP 110/52 mmHg   Pulse 74  Temp(Src) 102.1 F (38.9 C) (Rectal)  Resp 16  SpO2 95% Physical Exam  Constitutional: No distress.  HENT:  Head: Normocephalic and atraumatic.  Eyes: EOM are normal. Pupils are equal, round, and reactive to light.  Neck: Normal range of motion. Neck supple.  Cardiovascular: Normal rate, regular rhythm, normal heart sounds and intact distal pulses.   No murmur heard. Pulmonary/Chest: Effort normal. No respiratory distress.  Abdominal: Soft. He exhibits no distension. There is no tenderness.  Musculoskeletal: He exhibits no edema.  Neurological: He is alert. He exhibits normal muscle tone.  Oriented to person  Skin: Skin is warm and dry. No rash noted. He is not diaphoretic.  Vitals reviewed.   ED Course  Procedures (including critical care time) Labs Review Labs Reviewed  BASIC METABOLIC PANEL - Abnormal; Notable for the following:    Glucose, Bld 183 (*)    BUN 35 (*)    Creatinine, Ser 1.85 (*)    GFR calc non Af Amer 32 (*)    GFR calc Af Amer 37 (*)    All other components within normal limits  CBC - Abnormal; Notable for the following:    RBC 4.21 (*)    Platelets 97 (*)    All other components within normal limits  URINALYSIS, ROUTINE W REFLEX MICROSCOPIC (NOT AT [facility]) - Abnormal; Notable for the following:    APPearance CLOUDY (*)    Hgb urine dipstick TRACE (*)    Protein, ur 100 (*)    Nitrite POSITIVE (*)    Leukocytes, UA MODERATE (*)    All other components within normal limits  URINE MICROSCOPIC-ADD ON - Abnormal; Notable for the following:    Bacteria, UA MANY (*)    All other components within normal limits  I-STAT CG4 LACTIC ACID, ED - Abnormal; Notable for the following:    Lactic Acid, Venous 2.05 (*)    All other components within normal limits  CBG MONITORING, ED - Abnormal; Notable for the following:    Glucose-Capillary 176 (*)    All other components within normal limits  URINE CULTURE  LIPASE, BLOOD  I-STAT TROPOININ, ED     Imaging Review Dg Chest 2 View  10/31/2014   CLINICAL DATA:  Shortness of breath  EXAM: CHEST  2 VIEW  COMPARISON:  10/16/2014  FINDINGS: Dual-chamber pacer leads from the left are in stable position. Normal heart size and aortic contours.  There is no edema, consolidation, effusion, or pneumothorax.  IMPRESSION: Stable exam.  No evidence of acute disease.   Electronically Signed   By: Marnee Spring M.D.   On: 10/31/2014 22:08   I have personally reviewed and evaluated these images and lab results  as part of my medical decision-making.   EKG Interpretation   Date/Time:  Thursday October 31 2014 20:51:56 EDT Ventricular Rate:  75 PR Interval:  224 QRS Duration: 147 QT Interval:  416 QTC Calculation: 465 R Axis:   -80 Text Interpretation:  Atrial-sensed ventricular-paced complexes No further  analysis attempted due to paced rhythm Baseline wander in lead(s) V3 V4 No  significant change since last tracing Confirmed by Tyrone Apple ([id number])  on 10/31/2014 10:37:41 PM      MDM  Patient was seen and evaluated with son at the bedside.  Patient noted to have lactic acidosis and mild increase in already elevated Cr.  IV fluids given with concern regarding his history of CHF.  UA consistent with infection, febrile on rectal temperature.  Other results unremarkable for patient.  The patient was started on Rocephin.  In light of elevated lactic acid, need for hydration in setting of complicated CHF, discussed case with Dr. Maryfrances Bunnell who agreed with admission and patient admitted under his care for further treatment and evaluation. Final diagnoses:  None    1. UTI  2. Lactic acidosis  3. Dehydration    Leta Baptist, MD 11/01/14 0040

## 2014-10-31 NOTE — Progress Notes (Signed)
Patient recently admitted and discharged from the hospital from 09/19 to 09/22.  Per chart review, patient's son refused home health services at that time.  Patient has services with [facility] during the day and patient's son is with the patient at night, per chart review.

## 2014-10-31 NOTE — ED Notes (Signed)
Notified EDP,[place] pt. i-stat CG4 Lactic acid results 2.05.

## 2014-11-01 ENCOUNTER — Encounter (HOSPITAL_COMMUNITY): Payer: Self-pay | Admitting: Family Medicine

## 2014-11-01 DIAGNOSIS — E1122 Type 2 diabetes mellitus with diabetic chronic kidney disease: Secondary | ICD-10-CM | POA: Diagnosis present

## 2014-11-01 DIAGNOSIS — D631 Anemia in chronic kidney disease: Secondary | ICD-10-CM | POA: Diagnosis present

## 2014-11-01 DIAGNOSIS — N189 Chronic kidney disease, unspecified: Secondary | ICD-10-CM

## 2014-11-01 DIAGNOSIS — Z7982 Long term (current) use of aspirin: Secondary | ICD-10-CM | POA: Diagnosis not present

## 2014-11-01 DIAGNOSIS — D696 Thrombocytopenia, unspecified: Secondary | ICD-10-CM | POA: Diagnosis present

## 2014-11-01 DIAGNOSIS — J45909 Unspecified asthma, uncomplicated: Secondary | ICD-10-CM

## 2014-11-01 DIAGNOSIS — I130 Hypertensive heart and chronic kidney disease with heart failure and stage 1 through stage 4 chronic kidney disease, or unspecified chronic kidney disease: Secondary | ICD-10-CM | POA: Diagnosis present

## 2014-11-01 DIAGNOSIS — I5042 Chronic combined systolic (congestive) and diastolic (congestive) heart failure: Secondary | ICD-10-CM | POA: Diagnosis present

## 2014-11-01 DIAGNOSIS — E872 Acidosis, unspecified: Secondary | ICD-10-CM | POA: Diagnosis present

## 2014-11-01 DIAGNOSIS — I442 Atrioventricular block, complete: Secondary | ICD-10-CM

## 2014-11-01 DIAGNOSIS — E782 Mixed hyperlipidemia: Secondary | ICD-10-CM | POA: Diagnosis present

## 2014-11-01 DIAGNOSIS — N390 Urinary tract infection, site not specified: Secondary | ICD-10-CM | POA: Diagnosis not present

## 2014-11-01 DIAGNOSIS — N400 Enlarged prostate without lower urinary tract symptoms: Secondary | ICD-10-CM | POA: Diagnosis not present

## 2014-11-01 DIAGNOSIS — D638 Anemia in other chronic diseases classified elsewhere: Secondary | ICD-10-CM | POA: Diagnosis not present

## 2014-11-01 DIAGNOSIS — Z794 Long term (current) use of insulin: Secondary | ICD-10-CM | POA: Diagnosis not present

## 2014-11-01 DIAGNOSIS — F0391 Unspecified dementia with behavioral disturbance: Secondary | ICD-10-CM | POA: Diagnosis present

## 2014-11-01 DIAGNOSIS — Z79899 Other long term (current) drug therapy: Secondary | ICD-10-CM | POA: Diagnosis not present

## 2014-11-01 DIAGNOSIS — G629 Polyneuropathy, unspecified: Secondary | ICD-10-CM | POA: Diagnosis present

## 2014-11-01 DIAGNOSIS — I739 Peripheral vascular disease, unspecified: Secondary | ICD-10-CM | POA: Diagnosis present

## 2014-11-01 DIAGNOSIS — E1165 Type 2 diabetes mellitus with hyperglycemia: Secondary | ICD-10-CM | POA: Diagnosis present

## 2014-11-01 DIAGNOSIS — Z87891 Personal history of nicotine dependence: Secondary | ICD-10-CM | POA: Diagnosis not present

## 2014-11-01 DIAGNOSIS — E860 Dehydration: Secondary | ICD-10-CM | POA: Diagnosis present

## 2014-11-01 DIAGNOSIS — N179 Acute kidney failure, unspecified: Secondary | ICD-10-CM

## 2014-11-01 DIAGNOSIS — E861 Hypovolemia: Secondary | ICD-10-CM | POA: Diagnosis present

## 2014-11-01 DIAGNOSIS — N183 Chronic kidney disease, stage 3 (moderate): Secondary | ICD-10-CM | POA: Diagnosis not present

## 2014-11-01 DIAGNOSIS — Z950 Presence of cardiac pacemaker: Secondary | ICD-10-CM | POA: Diagnosis not present

## 2014-11-01 DIAGNOSIS — L8991 Pressure ulcer of unspecified site, stage 1: Secondary | ICD-10-CM | POA: Diagnosis present

## 2014-11-01 DIAGNOSIS — A419 Sepsis, unspecified organism: Secondary | ICD-10-CM | POA: Diagnosis not present

## 2014-11-01 DIAGNOSIS — Z888 Allergy status to other drugs, medicaments and biological substances status: Secondary | ICD-10-CM | POA: Diagnosis not present

## 2014-11-01 DIAGNOSIS — B961 Klebsiella pneumoniae [K. pneumoniae] as the cause of diseases classified elsewhere: Secondary | ICD-10-CM | POA: Diagnosis not present

## 2014-11-01 LAB — BASIC METABOLIC PANEL
Anion gap: 8 (ref 5–15)
BUN: 37 mg/dL — AB (ref 6–20)
CHLORIDE: 103 mmol/L (ref 101–111)
CO2: 24 mmol/L (ref 22–32)
CREATININE: 2.08 mg/dL — AB (ref 0.61–1.24)
Calcium: 8.3 mg/dL — ABNORMAL LOW (ref 8.9–10.3)
GFR calc non Af Amer: 28 mL/min — ABNORMAL LOW (ref >60)
GFR, EST AFRICAN AMERICAN: 32 mL/min — AB (ref >60)
Glucose, Bld: 200 mg/dL — ABNORMAL HIGH (ref 65–99)
POTASSIUM: 4.7 mmol/L (ref 3.5–5.1)
SODIUM: 135 mmol/L (ref 135–145)

## 2014-11-01 LAB — GLUCOSE, CAPILLARY
GLUCOSE-CAPILLARY: 183 mg/dL — AB (ref 65–99)
GLUCOSE-CAPILLARY: 175 mg/dL — AB (ref 65–99)
GLUCOSE-CAPILLARY: 170 mg/dL — AB (ref 65–99)
Glucose-Capillary: 148 mg/dL — ABNORMAL HIGH (ref 65–99)
Glucose-Capillary: 198 mg/dL — ABNORMAL HIGH (ref 65–99)

## 2014-11-01 LAB — CBC
HEMATOCRIT: 32 % — AB (ref 39.0–52.0)
Hemoglobin: 10.9 g/dL — ABNORMAL LOW (ref 13.0–17.0)
MCH: 32 pg (ref 26.0–34.0)
MCHC: 34.1 g/dL (ref 30.0–36.0)
MCV: 93.8 fL (ref 78.0–100.0)
Platelets: 92 10*3/uL — ABNORMAL LOW (ref 150–400)
RBC: 3.41 MIL/uL — AB (ref 4.22–5.81)
RDW: 13.9 % (ref 11.5–15.5)
WBC: 5.6 10*3/uL (ref 4.0–10.5)

## 2014-11-01 LAB — CREATININE, URINE, RANDOM: CREATININE, URINE: 121.13 mg/dL

## 2014-11-01 LAB — HEPATIC FUNCTION PANEL
ALBUMIN: 3.5 g/dL (ref 3.5–5.0)
ALK PHOS: 60 U/L (ref 38–126)
ALT: 19 U/L (ref 17–63)
AST: 26 U/L (ref 15–41)
BILIRUBIN TOTAL: 0.6 mg/dL (ref 0.3–1.2)
Bilirubin, Direct: 0.2 mg/dL (ref 0.1–0.5)
Indirect Bilirubin: 0.4 mg/dL (ref 0.3–0.9)
TOTAL PROTEIN: 6.6 g/dL (ref 6.5–8.1)

## 2014-11-01 LAB — CG4 I-STAT (LACTIC ACID): LACTIC ACID, VENOUS: 0.69 mmol/L (ref 0.5–2.0)

## 2014-11-01 LAB — LACTIC ACID, PLASMA: Lactic Acid, Venous: 1.3 mmol/L (ref 0.5–2.0)

## 2014-11-01 LAB — SODIUM, URINE, RANDOM: Sodium, Ur: 57 mmol/L

## 2014-11-01 MED ORDER — SERTRALINE HCL 100 MG PO TABS
100.0000 mg | ORAL_TABLET | Freq: Every day | ORAL | Status: DC
  Administered 2014-11-01 – 2014-11-03 (×3): 100 mg via ORAL
  Filled 2014-11-01 (×3): qty 1

## 2014-11-01 MED ORDER — MEMANTINE HCL 10 MG PO TABS
10.0000 mg | ORAL_TABLET | Freq: Every day | ORAL | Status: DC
  Administered 2014-11-01 – 2014-11-03 (×3): 10 mg via ORAL
  Filled 2014-11-01 (×3): qty 1

## 2014-11-01 MED ORDER — INSULIN ASPART 100 UNIT/ML ~~LOC~~ SOLN
0.0000 [IU] | Freq: Every day | SUBCUTANEOUS | Status: DC
  Administered 2014-11-02: 2 [IU] via SUBCUTANEOUS

## 2014-11-01 MED ORDER — DONEPEZIL HCL 5 MG PO TABS
10.0000 mg | ORAL_TABLET | Freq: Every day | ORAL | Status: DC
  Administered 2014-11-01 – 2014-11-03 (×3): 10 mg via ORAL
  Filled 2014-11-01 (×3): qty 2

## 2014-11-01 MED ORDER — ROSUVASTATIN CALCIUM 5 MG PO TABS
5.0000 mg | ORAL_TABLET | Freq: Every day | ORAL | Status: DC
  Administered 2014-11-01 – 2014-11-03 (×3): 5 mg via ORAL
  Filled 2014-11-01 (×3): qty 1

## 2014-11-01 MED ORDER — FINASTERIDE 5 MG PO TABS
5.0000 mg | ORAL_TABLET | Freq: Every day | ORAL | Status: DC
  Administered 2014-11-01 – 2014-11-04 (×4): 5 mg via ORAL
  Filled 2014-11-01 (×4): qty 1

## 2014-11-01 MED ORDER — MIRTAZAPINE 15 MG PO TABS
7.5000 mg | ORAL_TABLET | Freq: Every day | ORAL | Status: DC
  Administered 2014-11-01 – 2014-11-03 (×3): 7.5 mg via ORAL
  Filled 2014-11-01 (×3): qty 1

## 2014-11-01 MED ORDER — INSULIN ASPART 100 UNIT/ML ~~LOC~~ SOLN
0.0000 [IU] | Freq: Three times a day (TID) | SUBCUTANEOUS | Status: DC
  Administered 2014-11-01 (×3): 2 [IU] via SUBCUTANEOUS
  Administered 2014-11-02: 1 [IU] via SUBCUTANEOUS
  Administered 2014-11-02 – 2014-11-03 (×2): 3 [IU] via SUBCUTANEOUS
  Administered 2014-11-04: 2 [IU] via SUBCUTANEOUS

## 2014-11-01 MED ORDER — CARVEDILOL 6.25 MG PO TABS
6.2500 mg | ORAL_TABLET | Freq: Two times a day (BID) | ORAL | Status: DC
  Administered 2014-11-01 – 2014-11-04 (×7): 6.25 mg via ORAL
  Filled 2014-11-01 (×7): qty 1

## 2014-11-01 MED ORDER — INSULIN GLARGINE 100 UNIT/ML ~~LOC~~ SOLN
50.0000 [IU] | Freq: Every morning | SUBCUTANEOUS | Status: DC
  Administered 2014-11-01 – 2014-11-04 (×4): 50 [IU] via SUBCUTANEOUS
  Filled 2014-11-01 (×4): qty 0.5

## 2014-11-01 MED ORDER — ENOXAPARIN SODIUM 40 MG/0.4ML ~~LOC~~ SOLN: 40.0000 mg | SUBCUTANEOUS | Status: DC

## 2014-11-01 MED ORDER — INFLUENZA VAC SPLIT QUAD 0.5 ML IM SUSY
0.5000 mL | PREFILLED_SYRINGE | INTRAMUSCULAR | Status: AC
  Administered 2014-11-02: 0.5 mL via INTRAMUSCULAR
  Filled 2014-11-01 (×2): qty 0.5

## 2014-11-01 MED ORDER — ENOXAPARIN SODIUM 30 MG/0.3ML ~~LOC~~ SOLN
30.0000 mg | SUBCUTANEOUS | Status: DC
  Administered 2014-11-01 – 2014-11-03 (×3): 30 mg via SUBCUTANEOUS
  Filled 2014-11-01 (×3): qty 0.3

## 2014-11-01 MED ORDER — SODIUM CHLORIDE 0.9 % IV BOLUS (SEPSIS)
500.0000 mL | Freq: Once | INTRAVENOUS | Status: AC
  Administered 2014-11-01: 500 mL via INTRAVENOUS

## 2014-11-01 MED ORDER — CEFTRIAXONE SODIUM 1 G IJ SOLR
1.0000 g | INTRAMUSCULAR | Status: DC
  Administered 2014-11-02 – 2014-11-03 (×2): 1 g via INTRAVENOUS
  Filled 2014-11-01 (×3): qty 10

## 2014-11-01 MED ORDER — SODIUM CHLORIDE 0.9 % IV SOLN
INTRAVENOUS | Status: DC
  Administered 2014-11-01 – 2014-11-02 (×4): via INTRAVENOUS

## 2014-11-01 MED ORDER — ASPIRIN EC 81 MG PO TBEC
81.0000 mg | DELAYED_RELEASE_TABLET | Freq: Every day | ORAL | Status: DC
  Administered 2014-11-01 – 2014-11-03 (×3): 81 mg via ORAL
  Filled 2014-11-01 (×3): qty 1

## 2014-11-01 NOTE — H&P (Signed)
History and Physical  Dakota White  WUJ:[id number]  DOB: 07-07-1928  DOA: 10/31/2014  Referring physician: Tyrone Apple, MD PCP: Mickie Hillier, MD   Chief Complaint: Altered mental status  HPI: Dakota White is a 81 y.o. male with a past medical history significant for dementia, type 2 diabetes, hypertension, heart block with pacer, and CHF combined systolic and diastolic who presents with increased weakness for 1 day.  All history is collected from the patient who is a poor historian and ED personnel.  The patient was recently admitted 9/14 to 9/15 for new systolic dysfunction and then readmitted 4 days later from 9/19 to 9/22 with AKI from new ACE inhibitor. Since discharge, he has been stable at his nursing home until tonight, when family noted the patient was more weak and ill-appearing than normal.  He was spitting up, but not noted to be febrile, coughing, short of breath.   Family brought him to the ER, where he was noted to have worsening renal function (baseline 1.4 mg/dL), elevated lactic acid, thrombocytopenia and fever.  A urinalysiis was suspicious for UTI, and he was started on ceftriaxone and admitted to [facility] for presumed early sepsis from UTI.    Review of Systems:  Patient seen 1:10 AM on 11/01/2014. Unable to obtain due to patient degree of dementia.  Past Medical History  Diagnosis Date  . Type II or unspecified type diabetes mellitus without mention of complication, uncontrolled   . Essential hypertension, benign   . Mental disorder   . Dementia   . Peripheral neuropathy   . Allergic rhinitis   . Asthma with bronchitis   . Complete heart block     Requiring permanent St. Jude DDD pacemaker by Dr. Ladona Ridgel  . Peripheral vascular disease, unspecified   . Cardiac pacemaker in situ     Normal function  . History of echocardiogram 01/2012    EF 60-65%   . Mixed hyperlipidemia   . CHF (congestive heart failure)   The above past medical history was reviewed.  Past  Surgical History  Procedure Laterality Date  . Rotator cuff repair    . Pacemaker insertion  Dec 2013  . Permanent pacemaker insertion N/A 01/31/2012    Procedure: PERMANENT PACEMAKER INSERTION;  Surgeon: Marinus Maw, MD;  Location: [facility] CATH LAB;  Service: Cardiovascular;  Laterality: N/A;  The above surgical history was reviewed.  Social History: Patient lives in a nursing home.  He appears dependent for all ADLs.  Per chart review, he is a former smoker.    Allergies  Allergen Reactions  . Actos [Pioglitazone]     "makes me feel lousy and makes me bleed"  . Ramipril     "pt not aware of allergy"    Family History  Problem Relation Age of Onset  . Heart disease Mother   . Alzheimer's disease Father   . Diabetes Sister   . COPD Brother   . Alzheimer's disease Sister     Prior to Admission medications   Medication Sig Start Date End Date Taking? Authorizing Carter Kassel  aspirin EC 81 MG tablet Take 81 mg by mouth at bedtime.   Yes Historical Demir Titsworth, MD  carvedilol (COREG) 3.125 MG tablet Take 2 tablets (6.25 mg total) by mouth 2 (two) times daily. 10/24/14  Yes Elease Etienne, MD  donepezil (ARICEPT) 10 MG tablet Take 1 tablet (10 mg total) by mouth at bedtime. 02/19/13  Yes Elenora Gamma, MD  finasteride (PROSCAR) 5 MG tablet  TAKE 1 TABLET BY MOUTH EVERY DAY   Yes Elenora Gamma, MD  insulin glargine (LANTUS) 100 UNIT/ML injection Inject 0.5 mLs (50 Units total) into the skin every morning. 10/24/14  Yes Elease Etienne, MD  memantine (NAMENDA) 10 MG tablet Take 1 tablet (10 mg total) by mouth at bedtime. 10/21/14  Yes Latrelle Dodrill, MD  mirtazapine (REMERON) 15 MG tablet Take 7.5 mg by mouth at bedtime.   Yes Historical Rainee Sweatt, MD  rosuvastatin (CRESTOR) 5 MG tablet Take 1 tablet (5 mg total) by mouth at bedtime. 02/19/13  Yes Elenora Gamma, MD  sertraline (ZOLOFT) 100 MG tablet Take 1 tablet (100 mg total) by mouth at bedtime. 02/19/13  Yes Elenora Gamma,  MD  glucose blood test strip Once daily 04/16/13   Elenora Gamma, MD  [facility] DELICA LANCETS 33G MISC 1 Device by Does not apply route 4 (four) times daily as needed. 02/19/13   Elenora Gamma, MD    Physical Exam: BP 110/52 mmHg  Pulse 74  Temp(Src) 102.1 F (38.9 C) (Rectal)  Resp 16  SpO2 95% General: Elderly adult male, alert and in no distress.  Flushed and sitting up in bed, able to respond articulately, but not oriented to time or situation. HEENT: Head normal.  Corneas clear, conjunctivae and sclerae normal without injection or icteru-s, lids and lashes normal.  PERRL and EOMI.  Nose normal.  OP tacky without erythema, exudates, cobblestoning, or ulcers.  No airway deformities.  Neck supple. No thyromegaly.  Lymph: No cervical or axillary lymphadenopathy. Skin: Warm and flushed.  Cardiac: RRR, nl S1-S2, no murmurs appreciated.  Capillary refill brisk. Respiratory: Normal respiratory rate and rhythm.  CTAB without rales or wheezes. Abdomen: BS present.  Abdomen soft.  No TTP or rebound all quadrants.  No masses or organomegaly.  No striae, dilated veins, rashes, or lesions.  No ascites, distension. Neuro: Sensorium responding to questions, but 3-item recall is impaired, can't remember recent hospitalization.  Cranial nerves intact.  Speech is fluent. Moves all extremities equally and with normal coordination.             Labs on Admission:  The metabolic panel is notable for elevated creatinine to 1.85 mg/dL from most recent previous 1.7 mg/dL.  Elevated glucose.  Normal sodium and potassium.   The complete blood count is notable for no leukocytosis.  Normal Hgb.  Thrombocytopenia, worse than previous.  Radiological Exams on Admission: Personally reviewed: Dg Chest 2 View  10/31/2014   CLINICAL DATA:  Shortness of breath  EXAM: CHEST  2 VIEW  COMPARISON:  10/16/2014  FINDINGS: Dual-chamber pacer leads from the left are in stable position. Normal heart size and aortic  contours.  There is no edema, consolidation, effusion, or pneumothorax.  IMPRESSION: Stable exam.  No evidence of acute disease.   Electronically Signed   By: Marnee Spring M.D.   On: 10/31/2014 22:08     Assessment/Plan Principal Problem:   UTI (lower urinary tract infection) Active Problems:   Essential hypertension   Asthma   Third degree heart block   Dementia   Acute on chronic kidney failure   DM (diabetes mellitus), type 2, uncontrolled   History of permanent cardiac pacemaker placement   Lactic acidosis   Chronic combined systolic and diastolic congestive heart failure    1. UTI with early sepsis:  Lactic acid resolved with fluid bolus in ER.  Patient initiated on IV antibiotics with ceftriaxone within 1 hour.  -  Ceftriaxone 1 g q24 hours -Follow blood cultures. -IVF  2. Chronic systolic and diastolic heart failure:  Stable -Cautious fluid resuscitation -Continue aspirin, BB and statin  3. Acute on chronic kidney failure:  Presumably from sepsis and hypovolemia -Check urine electrolytes -IVF  4. Type 2 diabetes: -Home glargine 50 units -Sliding scale corrections  5. Dementia with behavioral disturbance: -Continue memantine, donepezil, sertraline and mirtazapine  6. BPH:  -Continue finasteride  DVT PPx: Lovenox Diet: Cardiac Consultants: None Code Status: Full, as prior Family Communication: No family present at bedside   Disposition Plan:  Admission for observation is judged to be reasonable and necessary at this time because the patient's presenting symptoms, physical exam findings, and initial radiographic and laboratory data in the context of chronic comorbidities is felt to place him/her at high risk for further clinical deterioration but it is anticipated at this time that the patient may be medically stable for discharge from the hospital within 2 midnights of admission.  A. The patient's presenting symptoms include confusion, weakness, fever. B.  The initial radiographic and laboratory data are worrisome because of thrombocytopenia, elevated creatinine, elevated lactic acid.      Alberteen Sam [facility] Pager [phone]

## 2014-11-01 NOTE — Progress Notes (Signed)
Pt admitted after midnight. He was found to have UTI. Continue Rocephin  Manson Passey [facility] [phone]

## 2014-11-02 DIAGNOSIS — I5042 Chronic combined systolic (congestive) and diastolic (congestive) heart failure: Secondary | ICD-10-CM

## 2014-11-02 DIAGNOSIS — I10 Essential (primary) hypertension: Secondary | ICD-10-CM

## 2014-11-02 DIAGNOSIS — N183 Chronic kidney disease, stage 3 (moderate): Secondary | ICD-10-CM

## 2014-11-02 DIAGNOSIS — Z794 Long term (current) use of insulin: Secondary | ICD-10-CM

## 2014-11-02 DIAGNOSIS — N390 Urinary tract infection, site not specified: Secondary | ICD-10-CM | POA: Diagnosis not present

## 2014-11-02 DIAGNOSIS — F0390 Unspecified dementia without behavioral disturbance: Secondary | ICD-10-CM

## 2014-11-02 DIAGNOSIS — Z950 Presence of cardiac pacemaker: Secondary | ICD-10-CM

## 2014-11-02 DIAGNOSIS — L8991 Pressure ulcer of unspecified site, stage 1: Secondary | ICD-10-CM

## 2014-11-02 DIAGNOSIS — N400 Enlarged prostate without lower urinary tract symptoms: Secondary | ICD-10-CM

## 2014-11-02 DIAGNOSIS — E1165 Type 2 diabetes mellitus with hyperglycemia: Secondary | ICD-10-CM

## 2014-11-02 DIAGNOSIS — E1122 Type 2 diabetes mellitus with diabetic chronic kidney disease: Secondary | ICD-10-CM

## 2014-11-02 DIAGNOSIS — R531 Weakness: Secondary | ICD-10-CM

## 2014-11-02 LAB — GLUCOSE, CAPILLARY
GLUCOSE-CAPILLARY: 207 mg/dL — AB (ref 65–99)
Glucose-Capillary: 87 mg/dL (ref 65–99)
Glucose-Capillary: 224 mg/dL — ABNORMAL HIGH (ref 65–99)
Glucose-Capillary: 135 mg/dL — ABNORMAL HIGH (ref 65–99)

## 2014-11-02 NOTE — Progress Notes (Addendum)
Patient ID: Dakota White, male   DOB: April 25, 1928, 81 y.o.   MRN: [id number] [facility] PROGRESS NOTE  JAVED COTTO OZH:[id number] DOB: February 11, 1928 DOA: 10/31/2014 PCP: Mickie Hillier, MD  Brief narrative:    81 y.o. male with a past medical history significant for dementia, type 2 diabetes, hypertension, heart block status post pacemaker placement, combined systolic and diastolic CHF who presented to [facility] ED with increased weakness for 24 hours prior to this admission. He was found to have UTI and started on empiric rocephin.   Anticipated discharge: Likely by 10/2 or 10/3 depending on when the result of UCx available.  Assessment/Plan:    Principal Problem:   UTI (lower urinary tract infection) - UA on admission showed moderate leukocytes and nitrites along with many bacteria - Urine culture growing gram negative rods, follow up on final reprot - Continue IV rocephin   Active Problems:   Essential hypertension - Continue coreg 6.25 mg BID    Dementia - Stable - Continue aricept and namenda    DM (diabetes mellitus), type 2, uncontrolled, with renal complications with current long term insulin use - Continue Lantus 50 units in am along with SSI - Check A1c    History of permanent cardiac pacemaker placement - Stable    Chronic kidney disease, stage III (moderate) - Baseline creatinine 2.0 - Creatinine on this admission 1.85 - 2.08, at baseline    Chronic combined systolic and diastolic congestive heart failure - Compensated - 2 D ECHO 10/17/2014 with EF of 35% and grade 1 diastolic dysfunction - Continue coreg, Crestor, aspirin    Pressure ulcer, stage 1 - WOC consulted    BPH (benign prostatic hyperplasia) - Continue Proscar    Generalizes weakness - PT eval pending    DVT Prophylaxis  - Lovenox sub Q ordered    Code Status: Full.  Family Communication:  plan of care discussed with the patient Disposition Plan: needs PT eval, also awaiting urine culture  results    IV access:  Peripheral IV  Procedures and diagnostic studies:    Dg Chest 2 View 10/31/2014  Stable exam.  No evidence of acute disease.     Medical Consultants:  None   Other Consultants:  Physical therapy  IAnti-Infectives:   Rocephin 11/01/2014 -->    Manson Passey, MD  [facility] Pager [phone]  Time spent in minutes: 25 minutes  If 7PM-7AM, please contact night-coverage [email or web address] Password [email or web address] 11/02/2014, 12:21 PM   LOS: 1 day    HPI/Subjective: No acute overnight events. Patient reports feeling better.   Objective: Filed Vitals:   11/01/14 0654 11/01/14 1443 11/01/14 2127 11/02/14 0700  BP: 108/81 138/49 143/53 138/60  Pulse: 60 71 65 68  Temp: 97.7 F (36.5 C) 98.8 F (37.1 C) 99.3 F (37.4 C) 98 F (36.7 C)  TempSrc: Oral Oral Oral Oral  Resp: Height:      Weight:    74 kg (163 lb 2.3 oz)  SpO2: 98% 100% 99% 98%    Intake/Output Summary (Last 24 hours) at 11/02/14 1221 Last data filed at 11/02/14 0830  Gross per 24 hour  Intake   2010 ml  Output   1850 ml  Net    160 ml    Exam:   General:  Pt is alert, follows commands appropriately, not in acute distress  Cardiovascular: Regular rate and rhythm, S1/S2 appreciated   Respiratory: Clear to auscultation bilaterally, no wheezing, no crackles, no rhonchi  Abdomen: Soft, non tender, non distended, bowel sounds present  Extremities: No edema, pulses DP and PT palpable bilaterally  Neuro: Grossly nonfocal  Data Reviewed: Basic Metabolic Panel:  Recent Labs Lab 10/30/14 1229 10/31/14 2126 11/01/14 0230  NA 140 135 135  K 5.8* 4.7 4.7  CL 104 105 103  CO2 15* 22 24  GLUCOSE 60* 183* 200*  BUN 28* 35* 37*  CREATININE 1.77* 1.85* 2.08*  CALCIUM 9.2 8.9 8.3*   Liver Function Tests:  Recent Labs Lab 11/01/14 0230  AST 26  ALT 19  ALKPHOS 60  BILITOT 0.6  PROT 6.6  ALBUMIN 3.5    Recent Labs Lab 10/31/14 2126  LIPASE 30   No results  for input(s): AMMONIA in the last 168 hours. CBC:  Recent Labs Lab 10/28/14 1259 10/31/14 2126 11/01/14 0230  WBC 6.1 5.7 5.6  HGB 13.2 13.1 10.9*  HCT 40.0 39.5 32.0*  MCV 94.0 93.8 93.8  PLT 142.0* 97* 92*   Cardiac Enzymes: No results for input(s): CKTOTAL, CKMB, CKMBINDEX, TROPONINI in the last 168 hours. BNP: Invalid input(s): POCBNP CBG:  Recent Labs Lab 11/01/14 1123 11/01/14 1638 11/01/14 2124 11/02/14 0740 11/02/14 1150  GLUCAP 175* 170* 198* 87 224*    Recent Results (from the past 240 hour(s))  Urine culture     Status: None (Preliminary result)   Collection Time: 10/31/14 10:17 PM  Result Value Ref Range Status   Specimen Description URINE, CLEAN CATCH  Final   Special Requests NONE  Final   Culture   Final    >=100,000 COLONIES/mL GRAM NEGATIVE RODS Performed at [facility]    Report Status PENDING  Incomplete     Scheduled Meds: . aspirin EC  81 mg Oral QHS  . carvedilol  6.25 mg Oral BID  . cefTRIAXone (ROCEPHIN)  IV  1 g Intravenous Q24H  . donepezil  10 mg Oral QHS  . enoxaparin (LOVENOX) injection  30 mg Subcutaneous Q24H  . finasteride  5 mg Oral Daily  . Influenza vac split quadrivalent PF  0.5 mL Intramuscular Tomorrow-1000  . insulin aspart  0-5 Units Subcutaneous QHS  . insulin aspart  0-9 Units Subcutaneous TID WC  . insulin glargine  50 Units Subcutaneous q morning - 10a  . memantine  10 mg Oral QHS  . mirtazapine  7.5 mg Oral QHS  . rosuvastatin  5 mg Oral QHS  . sertraline  100 mg Oral QHS   Continuous Infusions: . sodium chloride 100 mL/hr at 11/02/14 [phone]

## 2014-11-03 DIAGNOSIS — D696 Thrombocytopenia, unspecified: Secondary | ICD-10-CM

## 2014-11-03 DIAGNOSIS — D638 Anemia in other chronic diseases classified elsewhere: Secondary | ICD-10-CM

## 2014-11-03 LAB — BASIC METABOLIC PANEL
Anion gap: 6 (ref 5–15)
BUN: 29 mg/dL — ABNORMAL HIGH (ref 6–20)
CALCIUM: 8.2 mg/dL — AB (ref 8.9–10.3)
CO2: 24 mmol/L (ref 22–32)
CREATININE: 1.53 mg/dL — AB (ref 0.61–1.24)
Chloride: 109 mmol/L (ref 101–111)
GFR calc non Af Amer: 40 mL/min — ABNORMAL LOW (ref >60)
GFR, EST AFRICAN AMERICAN: 46 mL/min — AB (ref >60)
Glucose, Bld: 116 mg/dL — ABNORMAL HIGH (ref 65–99)
Potassium: 4.5 mmol/L (ref 3.5–5.1)
Sodium: 139 mmol/L (ref 135–145)

## 2014-11-03 LAB — CBC
HEMATOCRIT: 31.2 % — AB (ref 39.0–52.0)
Hemoglobin: 10.3 g/dL — ABNORMAL LOW (ref 13.0–17.0)
MCH: 30.7 pg (ref 26.0–34.0)
MCHC: 33 g/dL (ref 30.0–36.0)
MCV: 93.1 fL (ref 78.0–100.0)
PLATELETS: 90 10*3/uL — AB (ref 150–400)
RBC: 3.35 MIL/uL — ABNORMAL LOW (ref 4.22–5.81)
RDW: 13.7 % (ref 11.5–15.5)
WBC: 4.6 10*3/uL (ref 4.0–10.5)

## 2014-11-03 LAB — GLUCOSE, CAPILLARY
GLUCOSE-CAPILLARY: 205 mg/dL — AB (ref 65–99)
Glucose-Capillary: 101 mg/dL — ABNORMAL HIGH (ref 65–99)
Glucose-Capillary: 115 mg/dL — ABNORMAL HIGH (ref 65–99)
Glucose-Capillary: 157 mg/dL — ABNORMAL HIGH (ref 65–99)

## 2014-11-03 LAB — URINE CULTURE: Culture: >=100000

## 2014-11-03 MED ORDER — CIPROFLOXACIN HCL 500 MG PO TABS: 500.0000 mg | ORAL_TABLET | Freq: Two times a day (BID) | ORAL | Status: DC

## 2014-11-03 NOTE — Consult Note (Signed)
WOC wound consult note Reason for Consult:Pressure ulcer. Thorough skin inspection with bedsdie RN reveals no pressure ulcers. Wound type:none noted.  Patient with Dm, so prevention skin care plan is in place. Pressure Ulcer POA: No Measurement:N/A Wound bed:N/A Drainage (amount, consistency, odor) None Periwound:N/A Dressing procedure/placement/frequency: Prevention skin care plan is in place for patient with limited mobility and diabetes.  Heels intact, sacrum intact.  Turning and repositioning, also floatation of heels indicated. WOC nursing team will not follow, but will remain available to this patient, the nursing and medical team.  Please re-consult if needed. Thanks, Ladona Mow, MSN, RN, GNP, [place], CWON-AP [phone])

## 2014-11-03 NOTE — Evaluation (Signed)
Physical Therapy Evaluation Patient Details Name: Dakota White MRN: [id number] DOB: 10/29/1928 Today's Date: 11/03/2014   History of Present Illness  81 yo male admitted with UTI. Hx of dementia, DM, HTN, pacemaker, CHF, peripheral neuropathy, CHF  Clinical Impression  On eval, pt was Min guard-Min assist for mobility-walked ~250 feet with RW. Pt tolerated activity well. No family present during session. Pt could likely be close to baseline with respect to mobility.     Follow Up Recommendations Home health PT;Supervision/Assistance - 24 hour (if familly agreeable-may decline HH)    Equipment Recommendations  None recommended by PT    Recommendations for Other Services       Precautions / Restrictions Precautions Precautions: Fall Restrictions Weight Bearing Restrictions: No      Mobility  Bed Mobility Overal bed mobility: Needs Assistance Bed Mobility: Supine to Sit;Sit to Supine     Supine to sit: Supervision        Transfers Overall transfer level: Needs assistance Equipment used: Rolling walker (2 wheeled) Transfers: Sit to/from Stand Sit to Stand: Min assist         General transfer comment: Stood without RW. Assist to rise, stabilize.   Ambulation/Gait Ambulation/Gait assistance: Min guard Ambulation Distance (Feet): 250 Feet Assistive device: Rolling walker (2 wheeled)       General Gait Details: Took a few steps in room without RW, pt very unsteady. Switched to use of RW-stability improved. close guard assist with use of RW. Pt tolerated distance well  Stairs            Wheelchair Mobility    Modified Rankin (Stroke Patients Only)       Balance   Sitting-balance support: Feet supported Sitting balance-Leahy Scale: Good     Standing balance support: During functional activity Standing balance-Leahy Scale: Fair                               Pertinent Vitals/Pain Pain Assessment: No/denies pain    Home Living  Family/patient expects to be discharged to:: Private residence Living Arrangements: Children Available Help at Discharge: Personal care attendant;Available 24 hours/day Type of Home: House Home Access: Stairs to enter     Home Layout: One level Home Equipment: Walker - 2 wheels Additional Comments: has caregiver [phone], son and grandson stay at night.    Prior Function Level of Independence: Needs assistance   Gait / Transfers Assistance Needed: caregiver reports that patient does get up to bathroom without assist, does not use RW even with instruction.     Comments: caregiver reports that patient needs a lot of encouragement to mobilize.     Hand Dominance        Extremity/Trunk Assessment   Upper Extremity Assessment: Generalized weakness           Lower Extremity Assessment: Generalized weakness      Cervical / Trunk Assessment: Kyphotic  Communication   Communication: No difficulties  Cognition Arousal/Alertness: Awake/alert Behavior During Therapy: WFL for tasks assessed/performed Overall Cognitive Status: History of cognitive impairments - at baseline                      General Comments      Exercises        Assessment/Plan    PT Assessment Patient needs continued PT services  PT Diagnosis Difficulty walking;Generalized weakness   PT Problem List Decreased strength;Decreased activity tolerance;Decreased balance;Decreased mobility;Decreased knowledge of  use of DME;Decreased safety awareness;Decreased cognition  PT Treatment Interventions DME instruction;Gait training;Functional mobility training;Therapeutic activities;Patient/family education;Balance training;Therapeutic exercise   PT Goals (Current goals can be found in the Care Plan section) Acute Rehab PT Goals Patient Stated Goal: none PT Goal Formulation: Patient unable to participate in goal setting Time For Goal Achievement: 11/17/14 Potential to Achieve Goals: Good     Frequency Min 3X/week   Barriers to discharge        Co-evaluation               End of Session Equipment Utilized During Treatment: Gait belt Activity Tolerance: Patient tolerated treatment well Patient left: in bed;with call bell/phone within reach;with bed alarm set           Time: 1610-9604 PT Time Calculation (min) (ACUTE ONLY): 16 min   Charges:   PT Evaluation $Initial PT Evaluation Tier I: 1 Procedure     PT G Codes:        Rebeca Alert, MPT Pager: [phone]

## 2014-11-03 NOTE — Discharge Instructions (Signed)

## 2014-11-03 NOTE — Progress Notes (Signed)
Patient ID: Dakota White, male   DOB: December 21, 1928, 80 y.o.   MRN: [id number] [facility] PROGRESS NOTE  Dakota White ION:[id number] DOB: 01/08/1929 DOA: 10/31/2014 PCP: Mickie Hillier, MD  Brief narrative:    81 y.o. male with a past medical history significant for dementia, type 2 diabetes, hypertension, heart block status post pacemaker placement, combined systolic and diastolic CHF who presented to [facility] ED with increased weakness for 24 hours prior to this admission. He was found to have UTI and started on empiric rocephin.   Anticipated discharge: Likely by 10/3 if urine culture results back.   Assessment/Plan:    Principal Problem:   UTI (lower urinary tract infection) - Urine culture - gram negative rods,final reprot pending - Continue rocephin for now  - Blood cultures so far show no growth   Active Problems:   Essential hypertension - Continue coreg 6.25 mg BID - BP 155/57    Dementia - Continue aricept and namenda    DM (diabetes mellitus), type 2, uncontrolled, with renal complications with current long term insulin use - Continue Lantus 50 units in am along with SSI - CBG's in past 24 hours: 207, 101, 205 - A1c pending as of 10/2.    History of permanent cardiac pacemaker placement - Stable - No complaints of chest pain     Chronic kidney disease, stage III (moderate) - Baseline creatinine 2.0 - Creatinine on this admission  Ranges 1.85 - 2.08 - Creatinine improving with IV fluids     Anemia of chronic disease - Due to anemia of CKD - Hemoglobin stable at 10.3 - Continue to monitor CBC     Chronic combined systolic and diastolic congestive heart failure - Compensated - 2 D ECHO 10/17/2014 with EF of 35% and grade 1 diastolic dysfunction - Will continue coreg, statin, aspirin     BPH (benign prostatic hyperplasia) - Continue Proscar daily     Thrombocytopenia - Stable platelets since admission but in 90's so will stop Lovenox and use SCD's for DVT  prophylaxis     Generalizes weakness - PT eval - HHPT, orders placed    DVT Prophylaxis  - Use SCD's bilaterally for DVT prophylaxis due to thrombocytopenia    Code Status: Full.  Family Communication:  plan of care discussed with the patient Disposition Plan: D/C 10/3 if urine culture results back   IV access:  Peripheral IV  Procedures and diagnostic studies:    Dg Chest 2 View 10/31/2014  Stable exam.  No evidence of acute disease.     Medical Consultants:  None   Other Consultants:  Physical therapy WOC  IAnti-Infectives:   Rocephin 11/01/2014 -->    Manson Passey, MD  [facility] Pager [phone]  Time spent in minutes:25 minutes  If 7PM-7AM, please contact night-coverage [email or web address] Password [email or web address] 11/03/2014, 2:02 PM   LOS: 2 days    HPI/Subjective: No acute overnight events. Patient reports no nausea or vomiting.   Objective: Filed Vitals:   11/02/14 1328 11/02/14 2150 11/03/14 0542 11/03/14 0952  BP: 132/48 110/67 149/61 155/57  Pulse: 60 74 62 60  Temp: 97.5 F (36.4 C) 97.5 F (36.4 C) 98.3 F (36.8 C)   TempSrc: Oral Oral Oral   Resp: Height:      Weight:   76.204 kg (168 lb)   SpO2: 99% 100% 97%     Intake/Output Summary (Last 24 hours) at 11/03/14 1402 Last data filed at 11/03/14 1327  Gross  per 24 hour  Intake   1140 ml  Output   1941 ml  Net   -801 ml    Exam:   General:  Pt is  not in acute distress  Cardiovascular: rate controlled, S1, S2 (+)   Respiratory: bilateral air entry, no wheezing   Abdomen: non tender, (+) BS  Extremities: No leg swelling, palpable pulses   Neuro: No focal deficits   Data Reviewed: Basic Metabolic Panel:  Recent Labs Lab 10/30/14 1229 10/31/14 2126 11/01/14 0230 11/03/14 0523  NA 140 135 135 139  K 5.8* 4.7 4.7 4.5  CL 104 105 103 109  CO2 15* GLUCOSE 60* 183* 200* 116*  BUN 28* 35* 37* 29*  CREATININE 1.77* 1.85* 2.08* 1.53*  CALCIUM 9.2 8.9 8.3*  8.2*   Liver Function Tests:  Recent Labs Lab 11/01/14 0230  AST 26  ALT 19  ALKPHOS 60  BILITOT 0.6  PROT 6.6  ALBUMIN 3.5    Recent Labs Lab 10/31/14 2126  LIPASE 30   No results for input(s): AMMONIA in the last 168 hours. CBC:  Recent Labs Lab 10/28/14 1259 10/31/14 2126 11/01/14 0230 11/03/14 0523  WBC 6.1 5.7 5.6 4.6  HGB 13.2 13.1 10.9* 10.3*  HCT 40.0 39.5 32.0* 31.2*  MCV 94.0 93.8 93.8 93.1  PLT 142.0* 97* 92* 90*   Cardiac Enzymes: No results for input(s): CKTOTAL, CKMB, CKMBINDEX, TROPONINI in the last 168 hours. BNP: Invalid input(s): POCBNP CBG:  Recent Labs Lab 11/02/14 1150 11/02/14 1644 11/02/14 2129 11/03/14 0741 11/03/14 1150  GLUCAP 224* 135* 207* 101* 205*    Recent Results (from the past 240 hour(s))  Urine culture     Status: None (Preliminary result)   Collection Time: 10/31/14 10:17 PM  Result Value Ref Range Status   Specimen Description URINE, CLEAN CATCH  Final   Special Requests NONE  Final   Culture   Final    >=100,000 COLONIES/mL GRAM NEGATIVE RODS Performed at [facility]    Report Status PENDING  Incomplete  Blood culture (routine x 2)     Status: None (Preliminary result)   Collection Time: 11/01/14  1:40 AM  Result Value Ref Range Status   Specimen Description BLOOD RIGHT HAND  Final   Special Requests BOTTLES DRAWN AEROBIC AND ANAEROBIC 5CC  Final   Culture   Final    NO GROWTH 1 DAY Performed at [facility]    Report Status PENDING  Incomplete  Blood culture (routine x 2)     Status: None (Preliminary result)   Collection Time: 11/01/14  1:42 AM  Result Value Ref Range Status   Specimen Description BLOOD LEFT FOREARM  Final   Special Requests BOTTLES DRAWN AEROBIC AND ANAEROBIC 5CC  Final   Culture   Final    NO GROWTH 1 DAY Performed at [facility]    Report Status PENDING  Incomplete     Scheduled Meds: . aspirin EC  81 mg Oral QHS  . carvedilol  6.25 mg Oral BID  .  cefTRIAXone (ROCEPHIN)  IV  1 g Intravenous Q24H  . donepezil  10 mg Oral QHS  . enoxaparin (LOVENOX) injection  30 mg Subcutaneous Q24H  . finasteride  5 mg Oral Daily  . insulin aspart  0-5 Units Subcutaneous QHS  . insulin aspart  0-9 Units Subcutaneous TID WC  . insulin glargine  50 Units Subcutaneous q morning - 10a  . memantine  10  mg Oral QHS  . mirtazapine  7.5 mg Oral QHS  . rosuvastatin  5 mg Oral QHS  . sertraline  100 mg Oral QHS   Continuous Infusions:

## 2014-11-04 DIAGNOSIS — B961 Klebsiella pneumoniae [K. pneumoniae] as the cause of diseases classified elsewhere: Secondary | ICD-10-CM

## 2014-11-04 LAB — BASIC METABOLIC PANEL
ANION GAP: 7 (ref 5–15)
BUN: 29 mg/dL — ABNORMAL HIGH (ref 6–20)
CHLORIDE: 109 mmol/L (ref 101–111)
CO2: 21 mmol/L — AB (ref 22–32)
Calcium: 8.3 mg/dL — ABNORMAL LOW (ref 8.9–10.3)
Creatinine, Ser: 1.35 mg/dL — ABNORMAL HIGH (ref 0.61–1.24)
GFR calc non Af Amer: 47 mL/min — ABNORMAL LOW (ref >60)
GFR, EST AFRICAN AMERICAN: 54 mL/min — AB (ref >60)
Glucose, Bld: 133 mg/dL — ABNORMAL HIGH (ref 65–99)
POTASSIUM: 4.3 mmol/L (ref 3.5–5.1)
SODIUM: 137 mmol/L (ref 135–145)

## 2014-11-04 LAB — CBC
HEMATOCRIT: 30.9 % — AB (ref 39.0–52.0)
HEMOGLOBIN: 10.2 g/dL — AB (ref 13.0–17.0)
MCH: 30.3 pg (ref 26.0–34.0)
MCHC: 33 g/dL (ref 30.0–36.0)
MCV: 91.7 fL (ref 78.0–100.0)
Platelets: 105 10*3/uL — ABNORMAL LOW (ref 150–400)
RBC: 3.37 MIL/uL — AB (ref 4.22–5.81)
RDW: 13.7 % (ref 11.5–15.5)
WBC: 4.3 10*3/uL (ref 4.0–10.5)

## 2014-11-04 LAB — GLUCOSE, CAPILLARY
GLUCOSE-CAPILLARY: 190 mg/dL — AB (ref 65–99)
Glucose-Capillary: 107 mg/dL — ABNORMAL HIGH (ref 65–99)

## 2014-11-04 LAB — HEMOGLOBIN A1C
HEMOGLOBIN A1C: 8 % — AB (ref 4.8–5.6)
MEAN PLASMA GLUCOSE: 183 mg/dL

## 2014-11-04 NOTE — Progress Notes (Addendum)
[id number]/[id number] Davis,RN,BSN,CCM: spoke with patient concerning need for hhc states that he has son that lives with him and the [facility] will not be needed.  Did instruct him to call his reg md if the care burden was to great once he got home and some hhc can be arranged.

## 2014-11-04 NOTE — Progress Notes (Signed)
Per MD verbal order, patient can participate in ACES program post hospitalization.  Note given to patient.  IV taken out.  All medications and discharge information gone over with patient by Sidonie Dickens, RN.  All questions answered.  Pt wheeled out by NT.

## 2014-11-04 NOTE — Discharge Summary (Addendum)
Physician Discharge Summary  Dakota White UJW:[id number] DOB: [birth year] DOA: 10/31/2014  PCP: Mickie Hillier, MD  Admit date: 10/31/2014 Discharge date: 11/04/2014  Recommendations for Outpatient Follow-up:  1. Take cipro for 7 days on discharge for Klebsiella UTI.  Discharge Diagnoses:  Principal Problem:   UTI (lower urinary tract infection) Active Problems:   Essential hypertension   Dementia   DM (diabetes mellitus), type 2, uncontrolled, with renal complications (HCC)   History of permanent cardiac pacemaker placement   Chronic kidney disease, stage III (moderate)   Chronic combined systolic and diastolic congestive heart failure (HCC)   BPH (benign prostatic hyperplasia)   General weakness   Anemia of chronic disease   Thrombocytopenia (HCC)    Discharge Condition: stable   Diet recommendation: as tolerated   History of present illness:  81 y.o. male with a past medical history significant for dementia, type 2 diabetes, hypertension, heart block status post pacemaker placement, combined systolic and diastolic CHF who presented to [facility] ED with increased weakness for 24 hours prior to this admission. He was found to have UTI and started on empiric rocephin. Urine culture grew Klebsiella and he will continue cipro on discharge as noted above.   Hospital Course:    Assessment/Plan:    Principal Problem:  UTI (lower urinary tract infection) due to Klebsiella infection  - Urine culture grew Klebsiella - He was on rocephin on admission but will continue cipro on discharge as noted above  - Blood cultures negative so far   Active Problems:  Essential hypertension - Continue coreg 6.25 mg BID on discharge    Dementia - Continue aricept and namenda on discharge    DM (diabetes mellitus), type 2, uncontrolled, with renal complications with current long term insulin use - Continue Lantus 50 units daily on discharge as per prior to this admission  - A1c pending as of  10/3. Will be followed outpatient.    History of permanent cardiac pacemaker placement - Stable   Chronic kidney disease, stage III (moderate) - Baseline creatinine 2.0 - Creatinine improved on this admission with IV fluids    Anemia of chronic disease - Secondary to history of CKD - Hemoglobin stable    Chronic combined systolic and diastolic congestive heart failure - Compensated - 2 D ECHO 10/17/2014 with EF of 35% and grade 1 diastolic dysfunction - Continue coreg, statin, aspirin    BPH (benign prostatic hyperplasia) - Continue Proscar daily    Thrombocytopenia - Platelets improved with stopping Lovenox - Platelets 105 prior to discharge    Generalizes weakness - PT recommended HHPT, orders placed    DVT Prophylaxis  - SCD's bilaterally in hospital for DVT prophylaxis due to thrombocytopenia    Code Status: Full.  Family Communication: plan of care discussed with the patient   IV access:  Peripheral IV  Procedures and diagnostic studies:   Dg Chest 2 View 10/31/2014 Stable exam. No evidence of acute disease.   Medical Consultants:  None   Other Consultants:  Physical therapy  IAnti-Infectives:   Rocephin 11/01/2014 --> 11/04/2014     Signed:  Manson Passey, MD  [facility] 11/04/2014, 8:53 AM  Pager #: [phone]  Time spent in minutes: more than 30 minutes   Discharge Exam: Filed Vitals:   11/04/14 0600  BP: 151/67  Pulse: 59  Temp: 97.6 F (36.4 C)  Resp: 16   Filed Vitals:   11/03/14 1417 11/03/14 2144 11/04/14 0500 11/04/14 0600  BP: 138/64 150/53  151/67  Pulse: 60 61  59  Temp: 97.6 F (36.4 C) 98 F (36.7 C)  97.6 F (36.4 C)  TempSrc: Oral Oral  Oral  Resp: 18 20  16   Height:      Weight:   77.837 kg (171 lb 9.6 oz)   SpO2: 98% 100%  98%    General: Pt is alert, follows commands appropriately, not in acute distress Cardiovascular: Regular rate and rhythm, S1/S2 + Respiratory:  Clear to auscultation bilaterally, no wheezing, no crackles, no rhonchi Abdominal: Soft, non tender, non distended, bowel sounds +, no guarding Extremities: no edema, no cyanosis, pulses palpable bilaterally DP and PT Neuro: Grossly nonfocal  Discharge Instructions  Discharge Instructions    Call MD for:  difficulty breathing, headache or visual disturbances    Complete by:  As directed      Call MD for:  difficulty breathing, headache or visual disturbances    Complete by:  As directed      Call MD for:  persistant dizziness or light-headedness    Complete by:  As directed      Call MD for:  persistant nausea and vomiting    Complete by:  As directed      Call MD for:  persistant nausea and vomiting    Complete by:  As directed      Call MD for:  severe uncontrolled pain    Complete by:  As directed      Call MD for:  severe uncontrolled pain    Complete by:  As directed      Diet - low sodium heart healthy    Complete by:  As directed      Diet - low sodium heart healthy    Complete by:  As directed      Discharge instructions    Complete by:  As directed   1. Take cipro for 7 days on discharge for Klebsiella UTI.     Increase activity slowly    Complete by:  As directed      Increase activity slowly    Complete by:  As directed             Medication List    TAKE these medications        aspirin EC 81 MG tablet  Take 81 mg by mouth at bedtime.     carvedilol 3.125 MG tablet  Commonly known as:  COREG  Take 2 tablets (6.25 mg total) by mouth 2 (two) times daily.     ciprofloxacin 500 MG tablet  Commonly known as:  CIPRO  Take 1 tablet (500 mg total) by mouth 2 (two) times daily.     donepezil 10 MG tablet  Commonly known as:  ARICEPT  Take 1 tablet (10 mg total) by mouth at bedtime.     finasteride 5 MG tablet  Commonly known as:  PROSCAR  TAKE 1 TABLET BY MOUTH EVERY DAY        insulin glargine 100 UNIT/ML injection  Commonly known as:  LANTUS  Inject  0.5 mLs (50 Units total) into the skin every morning.     memantine 10 MG tablet  Commonly known as:  NAMENDA  Take 1 tablet (10 mg total) by mouth at bedtime.     mirtazapine 15 MG tablet  Commonly known as:  REMERON  Take 7.5 mg by mouth at bedtime.        rosuvastatin 5 MG tablet  Commonly known as:  CRESTOR  Take 1 tablet (5 mg total) by mouth at bedtime.     sertraline 100 MG tablet  Commonly known as:  ZOLOFT  Take 1 tablet (100 mg total) by mouth at bedtime.           Follow-up Information    Follow up with Mickie Hillier, MD. Schedule an appointment as soon as possible for a visit in 1 week.   Specialty:  Family Medicine   Why:  Follow up appt after recent hospitalization   Contact information:   [address] [place] [address] [phone]        The results of significant diagnostics from this hospitalization (including imaging, microbiology, ancillary and laboratory) are listed below for reference.    Significant Diagnostic Studies: Dg Chest 2 View  10/31/2014   CLINICAL DATA:  Shortness of breath  EXAM: CHEST  2 VIEW  COMPARISON:  10/16/2014  FINDINGS: Dual-chamber pacer leads from the left are in stable position. Normal heart size and aortic contours.  There is no edema, consolidation, effusion, or pneumothorax.  IMPRESSION: Stable exam.  No evidence of acute disease.   Electronically Signed   By: Marnee Spring M.D.   On: 10/31/2014 22:08   Dg Chest 2 View  10/16/2014   CLINICAL DATA:  Nausea, vomiting and difficulty breathing for 3 days.  EXAM: CHEST - 2 VIEW; ABDOMEN - 2 VIEW  COMPARISON:  02/01/2012 chest x-ray.  FINDINGS: Chest x-ray:  The cardiac silhouette, mediastinal and hilar contours are normal. The pacer wires are stable. The lungs are clear. Stable eventration of the right hemidiaphragm.  Abdomen:  The bowel gas pattern is unremarkable. There is scattered air and stool throughout the colon and down into the rectum. There are some scattered  air-filled small bowel loops but no distention or air-fluid levels. The soft tissue shadows are maintained. No worrisome calcifications. The bony structures are intact.  IMPRESSION: 1. No acute cardiopulmonary findings. 2. No plain film findings for an acute abdominal process.   Electronically Signed   By: Rudie Meyer M.D.   On: 10/16/2014 15:23   Ct Head Wo Contrast  10/22/2014   CLINICAL DATA:  Slurred speech, abnormal gait, generalized weakness  EXAM: CT HEAD WITHOUT CONTRAST  TECHNIQUE: Contiguous axial images were obtained from the base of the skull through the vertex without intravenous contrast.  COMPARISON:  05/07/2009  FINDINGS: No evidence of parenchymal hemorrhage or extra-axial fluid collection. No mass lesion, mass effect, or midline shift.  No CT evidence of acute infarction.  Subcortical white matter and periventricular small vessel ischemic changes. Mild intracranial atherosclerosis.  Age related atrophy.  No ventriculomegaly.  The visualized paranasal sinuses are essentially clear. The mastoid air cells are unopacified.  No evidence of calvarial fracture.  IMPRESSION: No evidence of acute intracranial abnormality.  Small vessel ischemic changes.   Electronically Signed   By: Charline Bills M.D.   On: 10/22/2014 02:01   Dg Abd 2 Views  10/16/2014   CLINICAL DATA:  Nausea, vomiting and difficulty breathing for 3 days.  EXAM: CHEST - 2 VIEW; ABDOMEN - 2 VIEW  COMPARISON:  02/01/2012 chest x-ray.  FINDINGS: Chest x-ray:  The cardiac silhouette, mediastinal and hilar contours are normal. The pacer wires are stable. The lungs are clear. Stable eventration of the right hemidiaphragm.  Abdomen:  The bowel gas pattern is unremarkable. There is scattered air and stool throughout the colon and down into the rectum. There are some scattered air-filled small bowel loops but  no distention or air-fluid levels. The soft tissue shadows are maintained. No worrisome calcifications. The bony structures are  intact.  IMPRESSION: 1. No acute cardiopulmonary findings. 2. No plain film findings for an acute abdominal process.   Electronically Signed   By: Rudie Meyer M.D.   On: 10/16/2014 15:23    Microbiology: Recent Results (from the past 240 hour(s))  Urine culture     Status: None   Collection Time: 10/31/14 10:17 PM  Result Value Ref Range Status   Specimen Description URINE, CLEAN CATCH  Final   Special Requests NONE  Final   Culture   Final    >=100,000 COLONIES/mL KLEBSIELLA PNEUMONIAE Performed at [facility]    Report Status 11/03/2014 FINAL  Final   Organism ID, Bacteria KLEBSIELLA PNEUMONIAE  Final      Susceptibility   Klebsiella pneumoniae - MIC*    AMPICILLIN >=32 RESISTANT Resistant     CEFAZOLIN <=4 SENSITIVE Sensitive     CEFTRIAXONE <=1 SENSITIVE Sensitive     CIPROFLOXACIN <=0.25 SENSITIVE Sensitive     GENTAMICIN <=1 SENSITIVE Sensitive     IMIPENEM <=0.25 SENSITIVE Sensitive     NITROFURANTOIN 64 INTERMEDIATE Intermediate     TRIMETH/SULFA <=20 SENSITIVE Sensitive     AMPICILLIN/SULBACTAM 8 SENSITIVE Sensitive     PIP/TAZO <=4 SENSITIVE Sensitive     * >=100,000 COLONIES/mL KLEBSIELLA PNEUMONIAE  Blood culture (routine x 2)     Status: None (Preliminary result)   Collection Time: 11/01/14  1:40 AM  Result Value Ref Range Status   Specimen Description BLOOD RIGHT HAND  Final   Special Requests BOTTLES DRAWN AEROBIC AND ANAEROBIC 5CC  Final   Culture   Final    NO GROWTH 2 DAYS Performed at [facility]    Report Status PENDING  Incomplete  Blood culture (routine x 2)     Status: None (Preliminary result)   Collection Time: 11/01/14  1:42 AM  Result Value Ref Range Status   Specimen Description BLOOD LEFT FOREARM  Final   Special Requests BOTTLES DRAWN AEROBIC AND ANAEROBIC 5CC  Final   Culture   Final    NO GROWTH 2 DAYS Performed at [facility]    Report Status PENDING  Incomplete     Labs: Basic Metabolic Panel:  Recent  Labs Lab 10/30/14 1229 10/31/14 2126 11/01/14 0230 11/03/14 0523 11/04/14 0438  NA 140 135 135 139 137  K 5.8* 4.7 4.7 4.5 4.3  CL 104 105 103 109 109  CO2 15* 22 24 24  21*  GLUCOSE 60* 183* 200* 116* 133*  BUN 28* 35* 37* 29* 29*  CREATININE 1.77* 1.85* 2.08* 1.53* 1.35*  CALCIUM 9.2 8.9 8.3* 8.2* 8.3*   Liver Function Tests:  Recent Labs Lab 11/01/14 0230  AST 26  ALT 19  ALKPHOS 60  BILITOT 0.6  PROT 6.6  ALBUMIN 3.5    Recent Labs Lab 10/31/14 2126  LIPASE 30   No results for input(s): AMMONIA in the last 168 hours. CBC:  Recent Labs Lab 10/28/14 1259 10/31/14 2126 11/01/14 0230 11/03/14 0523 11/04/14 0438  WBC 6.1 5.7 5.6 4.6 4.3  HGB 13.2 13.1 10.9* 10.3* 10.2*  HCT 40.0 39.5 32.0* 31.2* 30.9*  MCV 94.0 93.8 93.8 93.1 91.7  PLT 142.0* 97* 92* 90* 105*   Cardiac Enzymes: No results for input(s): CKTOTAL, CKMB, CKMBINDEX, TROPONINI in the last 168 hours. BNP: BNP (last 3 results) No results for input(s): BNP in the last 8760  hours.  ProBNP (last 3 results) No results for input(s): PROBNP in the last 8760 hours.  CBG:  Recent Labs Lab 11/03/14 0741 11/03/14 1150 11/03/14 1640 11/03/14 2143 11/04/14 0740  GLUCAP 101* 205* 115* 157* 107*

## 2014-11-06 LAB — CULTURE, BLOOD (ROUTINE X 2)
Culture: NO GROWTH
Culture: NO GROWTH

## 2014-11-11 ENCOUNTER — Encounter: Payer: Self-pay | Admitting: Internal Medicine

## 2014-11-11 ENCOUNTER — Ambulatory Visit (INDEPENDENT_AMBULATORY_CARE_PROVIDER_SITE_OTHER): Payer: Medicare Other | Admitting: Internal Medicine

## 2014-11-11 VITALS — BP 118/72 | HR 68 | Temp 97.6°F | Wt 166.0 lb

## 2014-11-11 DIAGNOSIS — N390 Urinary tract infection, site not specified: Secondary | ICD-10-CM | POA: Diagnosis not present

## 2014-11-11 DIAGNOSIS — F0390 Unspecified dementia without behavioral disturbance: Secondary | ICD-10-CM | POA: Diagnosis not present

## 2014-11-11 LAB — POCT URINALYSIS DIPSTICK
Bilirubin, UA: NEGATIVE
Glucose, UA: NEGATIVE
Ketones, UA: NEGATIVE
Leukocytes, UA: NEGATIVE
Nitrite, UA: NEGATIVE
PH UA: 5.5
PROTEIN UA: NEGATIVE
RBC UA: NEGATIVE
SPEC GRAV UA: 1.025
UROBILINOGEN UA: 0.2

## 2014-11-11 NOTE — Assessment & Plan Note (Signed)
Was concerned that change in mental status from baseline and lethargy could be related to failed treatment of UTI. UA collected today and was clear. Sent urine for culture. Also obtained BMET to check kidney function. Will follow up on results. Pending rule out of infection or worsening of kidney function, this is likely a decline in his dementia due to many recent hospitalizations and illnesses. Explained this likelihood to the caretaker and she was understanding, agreeing that she wanted to rule out other potential causes but that it was reasonable to anticipate that this might be the new "normal" of his dementia. It is possible that he will regain some of his cognition back but unlikely that he will return to the full state he was in prior to these hospitalizations.

## 2014-11-11 NOTE — Patient Instructions (Signed)
Thanks for coming in today. It was nice to meet you!   We will call you if the urine culture grows anything and will send in the correct antibiotic. We will also let you know if your blood work today shows anything concerning.   Please follow up with your PCP Dr. Wende Mott in a couple months.   Thanks,   Dr. Earlene Plater

## 2014-11-11 NOTE — Progress Notes (Signed)
Subjective:    Dakota White - 81 y.o. male MRN [id number]  Date of birth: August 11, 1928  HPI  Dakota White is here for hospital follow up. PMH is signigicant for DM Type II, HTN, Dementia, and BPH. He was discharged on 10/3 from [facility] after a 4 day hospitalization for sepsis 2/2 to UTI. Urine cx at that time grew Klebsiella, and he was discharged with a 7 day course of Ciprofloxacin. He completed the abx course this morning. Prior to this hospitalization, he was hospitalized in September with AKI after initiation of AceI. He was also recently treated for UTI at the [place] prior to these hospital admissions.   Today, [name] presents with his caretaker. Due to his dementia, patient is unable to provide history and most of information was obtained from the caretaker. Care taker reports concern over increased lethargy and more confusion of patient since he was discharged. Notes that he is sleeping until 1pm, when usually he gets up around 9 am. Has also had acute worsening in his short term memory, having needed to be prompted several times today to eat his sandwich at lunch. Has had new incontinence over the alst last few weeks and is now wearing Depends. Caretaker reports that he doesn't urinate frequently and often has to be reminded to drink water.   Dakota White denies dysuria and abdominal pain associated with urination. Caretaker states he has not complained of any symptoms. Has been afebrile at home.   Health Maintenance Due  Topic Date Due  . FOOT EXAM  06/14/1940  . OPHTHALMOLOGY EXAM  06/14/1940  . URINE MICROALBUMIN  06/14/1940  . TETANUS/TDAP  06/14/1949  . ZOSTAVAX  06/15/1990  . PNA vac Low Risk Adult (1 of 2 - PCV13) 06/15/1995    -  reports that he has quit smoking. His smoking use included Cigarettes. He has never used smokeless tobacco. - Review of Systems: Per HPI. - Past Medical History: Patient Active Problem List   Diagnosis Date Noted  . Anemia of chronic  disease 11/03/2014  . Thrombocytopenia (HCC) 11/03/2014  . BPH (benign prostatic hyperplasia) 11/02/2014  . General weakness 11/02/2014  . UTI (lower urinary tract infection) 11/01/2014  . Chronic combined systolic and diastolic congestive heart failure (HCC) 11/01/2014  . Chronic kidney disease, stage III (moderate) 10/21/2014  . History of permanent cardiac pacemaker placement 08/20/2013  . DM (diabetes mellitus), type 2, uncontrolled, with renal complications (HCC) 02/01/2012    Class: Chronic  . Dementia 01/30/2012    Class: Chronic  . Essential hypertension 11/17/2007   - Medications: reviewed and updated Current Outpatient Prescriptions  Medication Sig Dispense Refill  . aspirin EC 81 MG tablet Take 81 mg by mouth at bedtime.    . carvedilol (COREG) 3.125 MG tablet Take 2 tablets (6.25 mg total) by mouth 2 (two) times daily. 60 tablet 0  . ciprofloxacin (CIPRO) 500 MG tablet Take 1 tablet (500 mg total) by mouth 2 (two) times daily. 14 tablet 0  . donepezil (ARICEPT) 10 MG tablet Take 1 tablet (10 mg total) by mouth at bedtime. 30 tablet 11  . finasteride (PROSCAR) 5 MG tablet TAKE 1 TABLET BY MOUTH EVERY DAY 60 tablet 1  . glucose blood test strip Once daily    . insulin glargine (LANTUS) 100 UNIT/ML injection Inject 0.5 mLs (50 Units total) into the skin every morning.    . memantine (NAMENDA) 10 MG tablet Take 1 tablet (10 mg total) by mouth at  bedtime. 30 tablet 2  . mirtazapine (REMERON) 15 MG tablet Take 7.5 mg by mouth at bedtime.    [name] DELICA LANCETS 33G MISC 1 Device by Does not apply route 4 (four) times daily as needed. 200 each 11  . rosuvastatin (CRESTOR) 5 MG tablet Take 1 tablet (5 mg total) by mouth at bedtime. 30 tablet 11  . sertraline (ZOLOFT) 100 MG tablet Take 1 tablet (100 mg total) by mouth at bedtime. 30 tablet 11   No current facility-administered medications for this visit.     Review of Systems See HPI     Objective:   Physical Exam BP  118/72 mmHg  Pulse 68  Temp(Src) 97.6 F (36.4 C) (Oral)  Wt 166 lb (75.297 kg) Gen: NAD, sitting on exam table staring off  HEENT: NCAT, PERRL, clear conjunctiva, oropharynx clear, supple neck CV: RRR, good S1/S2, no murmur, no edema, capillary refill brisk  Resp: CTABL, no wheezes, non-labored Abd: SNTND, BS present, no guarding or organomegaly Skin: no rashes, normal turgor  Psych: Responds to his name and to direct questions. Otherwise not very alert or oriented.         Assessment & Plan:   Dementia Was concerned that change in mental status from baseline and lethargy could be related to failed treatment of UTI. UA collected today and was clear. Sent urine for culture. Also obtained BMET to check kidney function. Will follow up on results. Pending rule out of infection or worsening of kidney function, this is likely a decline in his dementia due to many recent hospitalizations and illnesses. Explained this likelihood to the caretaker and she was understanding, agreeing that she wanted to rule out other potential causes but that it was reasonable to anticipate that this might be the new "normal" of his dementia. It is possible that he will regain some of his cognition back but unlikely that he will return to the full state he was in prior to these hospitalizations.   Recommended follow up with PCP in the next 1-2 months to address chronic medical conditions and health maintenance items.

## 2014-11-12 LAB — BASIC METABOLIC PANEL WITH GFR
BUN: 33 mg/dL — AB (ref 7–25)
CALCIUM: 9.2 mg/dL (ref 8.6–10.3)
CHLORIDE: 104 mmol/L (ref 98–110)
CO2: 22 mmol/L (ref 20–31)
CREATININE: 1.66 mg/dL — AB (ref 0.70–1.11)
GFR, Est African American: 43 mL/min — ABNORMAL LOW (ref >=60)
GFR, Est Non African American: 37 mL/min — ABNORMAL LOW (ref >=60)
Glucose, Bld: 127 mg/dL — ABNORMAL HIGH (ref 65–99)
Potassium: 5 mmol/L (ref 3.5–5.3)
SODIUM: 137 mmol/L (ref 135–146)

## 2014-11-12 LAB — URINE CULTURE
Colony Count: NO GROWTH
Organism ID, Bacteria: NO GROWTH

## 2014-11-26 ENCOUNTER — Encounter: Payer: Medicare Other | Admitting: Internal Medicine

## 2014-12-16 ENCOUNTER — Ambulatory Visit: Payer: Medicare Other | Admitting: Nurse Practitioner

## 2014-12-17 ENCOUNTER — Encounter: Payer: Self-pay | Admitting: *Deleted

## 2014-12-19 ENCOUNTER — Encounter: Payer: Self-pay | Admitting: Physician Assistant

## 2014-12-19 ENCOUNTER — Ambulatory Visit (INDEPENDENT_AMBULATORY_CARE_PROVIDER_SITE_OTHER): Payer: Medicare Other | Admitting: Physician Assistant

## 2014-12-19 VITALS — BP 118/54 | HR 64 | Ht 70.0 in | Wt 166.0 lb

## 2014-12-19 DIAGNOSIS — I10 Essential (primary) hypertension: Secondary | ICD-10-CM

## 2014-12-19 DIAGNOSIS — N183 Chronic kidney disease, stage 3 unspecified: Secondary | ICD-10-CM

## 2014-12-19 DIAGNOSIS — I5042 Chronic combined systolic (congestive) and diastolic (congestive) heart failure: Secondary | ICD-10-CM

## 2014-12-19 DIAGNOSIS — Z950 Presence of cardiac pacemaker: Secondary | ICD-10-CM

## 2014-12-19 DIAGNOSIS — E785 Hyperlipidemia, unspecified: Secondary | ICD-10-CM

## 2014-12-19 DIAGNOSIS — E782 Mixed hyperlipidemia: Secondary | ICD-10-CM | POA: Insufficient documentation

## 2014-12-19 NOTE — Patient Instructions (Signed)
Medication Instructions:  Your physician recommends that you continue on your current medications as directed. Please refer to the Current Medication list given to you today.   Labwork: -None  Testing/Procedures: -None  Follow-Up: Your physician recommends that you keep your scheduled  follow-up appointment as outline on paperwork.   Any Other Special Instructions Will Be Listed Below (If Applicable). Please refill Nameda and Remeron medications as what doctor prescribed medications.    If you need a refill on your cardiac medications before your next appointment, please call your pharmacy.

## 2014-12-19 NOTE — Progress Notes (Signed)
Cardiology Office Note Date:  12/19/2014  Patient ID:  Dakota White, DOB 02/25/1928, MRN [id number] PCP:  Mickie HillierIan McKeag, MD  Cardiologist:  Dr. Mendel RyderH. Smith, EP: Dr. Ladona Ridgelaylor  Chief Complaint: f/u CHF  History of Present Illness: Dakota LambHowell W White is a 81 y.o. male with history of chronic combined CHF with recently diagnosed LV dysfunction, DDD pacemaker, DM, HTN, CKD III, moderate dementia who presents for follow-up.   He has had several admissions lately. He was admitted 9/14-9/15/16 with nausea, vomiting and near-syncope felt to be side effects from recent UTI abx. 2D echo 10/17/14: EF 35-40%, mild LVH,  moderate HK of mid-apical anteroseptal and anterior myocardium, HK of inferior myocardium, grade 1 DD, mild MR. He was seen as outpatient and placed on ACEI and BB. Before he could f/u the following week, he was then admitted 9/19-9/22 with transient slurred speech ultimately felt due to metabolic encephalopathy from acute kidney injury upon CKD stage III. CT head was negative for stroke. He was also noted to have mild anemia/thrombocytopenia. ACEI was discontinued due to the renal insufficiency. He saw Norma FredricksonLori Gerhardt NP in followup 10/28/14 and continued conservative management was recommended. He was readmitted 9/29-10/3 with weakness and UTI.  He comes in today with his caregiver Rose. She reports ever since discharge in October he's been doing fairly well. He denies any CP, SOB, LEE, orthopnea, weight gain, palpitations. He has not had any syncope. She reports that he has a good appetite but mostly for sweet things. Apparently in the middle of the night he's been known to sneak to the kitchen and steal 18-19 packs of Nabs and Nutrigrain bars to hoard in his room.      Past Medical History  Diagnosis Date  . Type II or unspecified type diabetes mellitus without mention of complication, uncontrolled   . Essential hypertension   . Mental disorder   . Dementia   . Peripheral neuropathy (HCC)   .  Allergic rhinitis   . Asthma with bronchitis   . Complete heart block (HCC)     a. Requiring permanent St. Jude DDD pacemaker by Dr. Ladona Ridgelaylor in 2013.  Marland Kitchen. Peripheral vascular disease, unspecified (HCC)   . Cardiac pacemaker in situ   . Mixed hyperlipidemia   . Chronic combined systolic and diastolic CHF (congestive heart failure) (HCC)     a. EF noted to be down 10/2014 - 2D echo 10/17/14: EF 35-40%, mild LVH,  moderate HK of mid-apical anteroseptal and anterior myocardium, HK of inferior myocardium, grade 1 DD, mild MR.  . Diabetes mellitus with renal complications (HCC)   . Dementia   . BPH (benign prostatic hyperplasia)   . General weakness   . Anemia of chronic disease   . Thrombocytopenia [facility](HCC)     Past Surgical History  Procedure Laterality Date  . Rotator cuff repair    . Pacemaker insertion  Dec 2013  . Permanent pacemaker insertion N/A 01/31/2012    Procedure: PERMANENT PACEMAKER INSERTION;  Surgeon: Marinus MawGregg W Taylor, MD;  [facility];  Service: Cardiovascular;  Laterality: N/A;    Current Outpatient Prescriptions  Medication Sig Dispense Refill  . aspirin EC 81 MG tablet Take 81 mg by mouth at bedtime.    . carvedilol (COREG) 3.125 MG tablet Take 2 tablets (6.25 mg total) by mouth 2 (two) times daily. 60 tablet 0  . ciprofloxacin (CIPRO) 500 MG tablet Take 1 tablet (500 mg total) by mouth 2 (two) times daily. 14 tablet 0  .  donepezil (ARICEPT) 10 MG tablet Take 1 tablet (10 mg total) by mouth at bedtime. 30 tablet 11  . finasteride (PROSCAR) 5 MG tablet TAKE 1 TABLET BY MOUTH EVERY DAY 60 tablet 1  . glucose blood test strip Once daily    . insulin glargine (LANTUS) 100 UNIT/ML injection Inject 0.5 mLs (50 Units total) into the skin every morning.    . memantine (NAMENDA) 10 MG tablet Take 1 tablet (10 mg total) by mouth at bedtime. 30 tablet 2  . mirtazapine (REMERON) 15 MG tablet Take 7.5 mg by mouth at bedtime.    [name] DELICA LANCETS 33G MISC 1 Device by Does  not apply route 4 (four) times daily as needed. 200 each 11  . rosuvastatin (CRESTOR) 5 MG tablet Take 1 tablet (5 mg total) by mouth at bedtime. 30 tablet 11  . sertraline (ZOLOFT) 100 MG tablet Take 1 tablet (100 mg total) by mouth at bedtime. 30 tablet 11   No current facility-administered medications for this visit.    Allergies:   Actos and Ramipril   Social History:  The patient  reports that he has quit smoking. His smoking use included Cigarettes. He has never used smokeless tobacco. He reports that he does not drink alcohol or use illicit drugs.   Family History:  The patient's family history includes Alzheimer's disease in his father and sister; COPD in his brother; Diabetes in his sister; Heart disease in his mother.  ROS:  Please see the history of present illness.   All other systems are reviewed and otherwise negative.   PHYSICAL EXAM:  VS:  BP 118/54 mmHg  Pulse 64  Ht  (1.778 m)  Wt 166 lb (75.297 kg)  BMI 23.82 kg/m2 BMI: Body mass index is 23.82 kg/(m^2). Well nourished, well developed elderly WM in no acute distress HEENT: normocephalic, atraumatic Neck: no JVD, carotid bruits or masses Cardiac:  normal S1, S2; RRR; no murmurs, rubs, or gallops Lungs:  clear to auscultation bilaterally, no wheezing, rhonchi or rales Abd: soft, nontender, no hepatomegaly, + BS MS: no deformity or atrophy Ext: no edema Skin: warm and dry, no rash Neuro:  moves all extremities spontaneously, follows commands - thinks he's at [facility] but otherwise does seem to answer simple questions easily Psych: euthymic mood, full affect  EKG:  Not done today.  Recent Labs: 10/16/2014: Magnesium 1.7; TSH 3.281 11/01/2014: ALT 19 11/04/2014: Hemoglobin 10.2*; Platelets 105* 11/11/2014: BUN 33*; Creat 1.66*; Potassium 5.0; Sodium 137  No results found for requested labs within last 365 days.   CrCl cannot be calculated (Patient has no serum creatinine result on file.).   Wt  Readings from Last 3 Encounters:  12/19/14 166 lb (75.297 kg)  11/11/14 166 lb (75.297 kg)  11/04/14 171 lb 9.6 oz (77.837 kg)     Other studies reviewed: Additional studies/records reviewed today include: summarized above  ASSESSMENT AND PLAN:  1. Chronic combined CHF  - appears euvolemic. Continue current regimen. Not on ACEI/ARB/spiro due to renal insufficiency. 2. Essential HTN - controlled. 3. Hyperlipidemia - continue statin. 4. CKD stage III - followed by primary care. 5. H/o pacemaker - he has f/u with Dr. Ladona Ridgel tomorrow. Will keep this appointment as there has been some prior mention in notes of possibility of RV pacing contributing to his LV dysfunction. Will await EP input to see if any adjustment is needed.  Disposition: F/u with Dr. Ladona Ridgel as planned tomorrow, and Dr. Katrinka Blazing as previously scheduled in  02/2015.  Current medicines are reviewed at length with the patient today.  The patient did not have any concerns regarding medicines. His caregiver requested refills on Remeron and Namenda - since we are not managing his dementia I advised they discuss refills with PCP to maintain continuity.  Thomasene Mohair PA-C 12/19/2014 12:03 PM     [facility] [address] [place] [address] [phone] (office)  [phone] (fax)

## 2014-12-20 ENCOUNTER — Encounter: Payer: Self-pay | Admitting: Internal Medicine

## 2014-12-20 ENCOUNTER — Ambulatory Visit (INDEPENDENT_AMBULATORY_CARE_PROVIDER_SITE_OTHER): Payer: Medicare Other | Admitting: Internal Medicine

## 2014-12-20 VITALS — BP 120/48 | HR 71 | Ht 70.0 in | Wt 168.4 lb

## 2014-12-20 DIAGNOSIS — I5042 Chronic combined systolic (congestive) and diastolic (congestive) heart failure: Secondary | ICD-10-CM

## 2014-12-20 DIAGNOSIS — I442 Atrioventricular block, complete: Secondary | ICD-10-CM | POA: Diagnosis not present

## 2014-12-20 DIAGNOSIS — Z950 Presence of cardiac pacemaker: Secondary | ICD-10-CM | POA: Diagnosis not present

## 2014-12-20 NOTE — Assessment & Plan Note (Signed)
His St. Jude DDD PM is working normally. Will recheck in several months. 

## 2014-12-20 NOTE — Patient Instructions (Signed)
Medication Instructions:  Your physician recommends that you continue on your current medications as directed. Please refer to the Current Medication list given to you today.   Labwork: None ordered None ordered   Testing/Procedures: None ordered   Follow-Up: Your physician wants you to follow-up in: 12 months with Dr Court Joyaylor You will receive a reminder letter in the mail two months in advance. If you don't receive a letter, please call our office to schedule the follow-up appointment.  Remote monitoring is used to monitor your Pacemaker  from home. This monitoring reduces the number of office visits required to check your device to one time per year. It allows us to keep an eye on the functioning of your device to ensure it is working properly. You are scheduled for a device check from home on 03/24/15. You may send your transmission at any time that day. If you have a wireless device, the transmission will be sent automatically. After your physician reviews your transmission, you will receive a postcard with your next transmission date.    Any Other Special Instructions Will Be Listed Below (If Applicable).     If you need a refill on your cardiac medications before your next appointment, please call your pharmacy.

## 2014-12-20 NOTE — Progress Notes (Signed)
HPI Mr. Mathes returns today for followup. He underwent PPM insertion due to symptomatic bradycardia 3 years ago. He was hospitalized last month with a UTI. He has advanced dementia. He has had no cardiac complaints.  Allergies  Allergen Reactions  . Actos [Pioglitazone]     "makes me feel lousy and makes me bleed"  . Ramipril     "pt not aware of allergy"     Current Outpatient Prescriptions  Medication Sig Dispense Refill  . aspirin EC 81 MG tablet Take 81 mg by mouth at bedtime.    . carvedilol (COREG) 3.125 MG tablet Take 2 tablets (6.25 mg total) by mouth 2 (two) times daily. 60 tablet 0  . donepezil (ARICEPT) 10 MG tablet Take 1 tablet (10 mg total) by mouth at bedtime. 30 tablet 11  . finasteride (PROSCAR) 5 MG tablet TAKE 1 TABLET BY MOUTH EVERY DAY 60 tablet 1  . glucose blood test strip Once daily    . insulin glargine (LANTUS) 100 UNIT/ML injection Inject 0.5 mLs (50 Units total) into the skin every morning.    . memantine (NAMENDA) 10 MG tablet Take 1 tablet (10 mg total) by mouth at bedtime. 30 tablet 2  . mirtazapine (REMERON) 15 MG tablet Take 7.5 mg by mouth at bedtime.    [name] DELICA LANCETS 33G MISC 1 Device by Does not apply route 4 (four) times daily as needed. 200 each 11  . rosuvastatin (CRESTOR) 5 MG tablet Take 1 tablet (5 mg total) by mouth at bedtime. 30 tablet 11  . sertraline (ZOLOFT) 100 MG tablet Take 1 tablet (100 mg total) by mouth at bedtime. 30 tablet 11   No current facility-administered medications for this visit.     Past Medical History  Diagnosis Date  . Type II or unspecified type diabetes mellitus without mention of complication, uncontrolled   . Essential hypertension   . Mental disorder   . Dementia   . Peripheral neuropathy (HCC)   . Allergic rhinitis   . Asthma with bronchitis   . Complete heart block (HCC)     a. Requiring permanent St. Jude DDD pacemaker by Dr. Ladona Ridgel in 2013.  Marland Kitchen Peripheral vascular disease,  unspecified (HCC)   . Cardiac pacemaker in situ   . Mixed hyperlipidemia   . Chronic combined systolic and diastolic CHF (congestive heart failure) (HCC)     a. EF noted to be down 10/2014 - 2D echo 10/17/14: EF 35-40%, mild LVH,  moderate HK of mid-apical anteroseptal and anterior myocardium, HK of inferior myocardium, grade 1 DD, mild MR.  . Diabetes mellitus with renal complications (HCC)   . Dementia   . BPH (benign prostatic hyperplasia)   . General weakness   . Anemia of chronic disease   . Thrombocytopenia (HCC)     ROS:   All systems reviewed and negative except as noted in the HPI.   Past Surgical History  Procedure Laterality Date  . Rotator cuff repair    . Pacemaker insertion  Dec 2013  . Permanent pacemaker insertion N/A 01/31/2012    Procedure: PERMANENT PACEMAKER INSERTION;  Surgeon: Marinus Maw, MD;  Location: [facility] CATH LAB;  Service: Cardiovascular;  Laterality: N/A;     Family History  Problem Relation Age of Onset  . Heart disease Mother   . Alzheimer's disease Father   . Diabetes Sister   . COPD Brother   . Alzheimer's disease Sister      Social  History   Social History  . Marital Status: Married    Spouse Name: N/A  . Number of Children: N/A  . Years of Education: N/A   Occupational History  . Retired Cytogeneticistveteran    Social History Main Topics  . Smoking status: Former Smoker    Types: Cigarettes  . Smokeless tobacco: Never Used  . Alcohol Use: No  . Drug Use: No  . Sexual Activity: No   Other Topics Concern  . Not on file   Social History Narrative   The patient is widowed     BP 120/48 mmHg  Pulse 71  Ht 5\' 10"  (1.778 m)  Wt 168 lb 6.4 oz (76.386 kg)  BMI 24.16 kg/m2  Physical Exam:  Well appearing NAD HEENT: Unremarkable Neck:  No JVD, no thyromegally Lymphatics:  No adenopathy Back:  No CVA tenderness Lungs:  Clear with no wheezes, well healed PPM incision. HEART:  Regular rate rhythm, no murmurs, no rubs, no  clicks Abd:  soft, positive bowel sounds, no organomegally, no rebound, no guarding Ext:  2 plus pulses, no edema, no cyanosis, no clubbing Skin:  No rashes no nodules Neuro:  CN II through XII intact, motor grossly intact  DEVICE  Normal device function.  See PaceArt for details.   Assess/Plan:

## 2014-12-20 NOTE — Assessment & Plan Note (Signed)
His symptoms are class 1. No change in meds.

## 2014-12-22 LAB — CUP PACEART INCLINIC DEVICE CHECK
Battery Remaining Longevity: 141.6
Battery Voltage: 2.98 V
Brady Statistic RA Percent Paced: 4.5 %
Date Time Interrogation Session: 20161118142905
Lead Channel Impedance Value: 412.5 Ohm
Lead Channel Pacing Threshold Pulse Width: 0.5 ms
Lead Channel Pacing Threshold Pulse Width: 0.5 ms
Lead Channel Setting Pacing Amplitude: 0.875
Lead Channel Setting Pacing Amplitude: 1.75 V
Lead Channel Setting Pacing Pulse Width: 0.5 ms
Lead Channel Setting Sensing Sensitivity: 4 mV
MDC IDC LEAD IMPLANT DT: 20131230
MDC IDC LEAD IMPLANT DT: 20131230
MDC IDC LEAD LOCATION: 753859
MDC IDC LEAD LOCATION: 753860
MDC IDC MSMT LEADCHNL RA PACING THRESHOLD AMPLITUDE: 0.75 V
MDC IDC MSMT LEADCHNL RA SENSING INTR AMPL: 3.5 mV
MDC IDC MSMT LEADCHNL RV IMPEDANCE VALUE: 600 Ohm
MDC IDC MSMT LEADCHNL RV PACING THRESHOLD AMPLITUDE: 0.625 V
MDC IDC PG SERIAL: 7438506
MDC IDC STAT BRADY RV PERCENT PACED: 99.98 %
Pulse Gen Model: 2210

## 2015-01-06 ENCOUNTER — Telehealth: Payer: Self-pay | Admitting: Interventional Cardiology

## 2015-01-06 ENCOUNTER — Other Ambulatory Visit: Payer: Self-pay | Admitting: *Deleted

## 2015-01-06 DIAGNOSIS — I5032 Chronic diastolic (congestive) heart failure: Secondary | ICD-10-CM

## 2015-01-06 MED ORDER — CARVEDILOL 3.125 MG PO TABS: 6.2500 mg | ORAL_TABLET | Freq: Two times a day (BID) | ORAL | Status: DC

## 2015-01-06 NOTE — Telephone Encounter (Signed)
Patient daughter calling asking for refill on patients Lantus. Uses [facility]

## 2015-01-06 NOTE — Telephone Encounter (Signed)
°*  STAT* If patient is at the pharmacy, call can be transferred to refill team.   1. Which medications need to be refilled? (please list name of each medication and dose if known) carvedilol (COREG) 3.125 MG tablet  2. Which pharmacy/location (including street and city if local pharmacy) is medication to be sent to? [facility]  [address], [place], [place] [address] [phone]  3. Do they need a 30 day or 90 day supply? 90 day   Comments: Refills need authorization.

## 2015-01-07 ENCOUNTER — Other Ambulatory Visit: Payer: Self-pay | Admitting: Family Medicine

## 2015-01-07 MED ORDER — INSULIN GLARGINE 100 UNIT/ML ~~LOC~~ SOLN: 50.0000 [IU] | Freq: Every morning | SUBCUTANEOUS | Status: DC

## 2015-01-07 NOTE — Telephone Encounter (Signed)
Daughter called because the pharmacy did not receive the Lantus. It was approved but it doesn't look like we sent this electronically. Can we do this since he is out of lantus. jw

## 2015-01-07 NOTE — Telephone Encounter (Signed)
Rx resent to pharmacy

## 2015-01-28 DIAGNOSIS — B351 Tinea unguium: Secondary | ICD-10-CM | POA: Diagnosis not present

## 2015-01-28 DIAGNOSIS — S99922A Unspecified injury of left foot, initial encounter: Secondary | ICD-10-CM | POA: Diagnosis not present

## 2015-02-06 ENCOUNTER — Ambulatory Visit (INDEPENDENT_AMBULATORY_CARE_PROVIDER_SITE_OTHER): Payer: Self-pay | Admitting: Interventional Cardiology

## 2015-02-06 DIAGNOSIS — N183 Chronic kidney disease, stage 3 (moderate): Secondary | ICD-10-CM

## 2015-02-06 DIAGNOSIS — Z950 Presence of cardiac pacemaker: Secondary | ICD-10-CM

## 2015-02-06 DIAGNOSIS — I5042 Chronic combined systolic (congestive) and diastolic (congestive) heart failure: Secondary | ICD-10-CM

## 2015-02-06 NOTE — Progress Notes (Signed)
No show

## 2015-02-13 ENCOUNTER — Encounter: Payer: Self-pay | Admitting: Interventional Cardiology

## 2015-03-03 ENCOUNTER — Other Ambulatory Visit: Payer: Self-pay | Admitting: Family Medicine

## 2015-03-24 ENCOUNTER — Encounter: Payer: Medicare Other | Admitting: *Deleted

## 2015-03-24 ENCOUNTER — Telehealth: Payer: Self-pay | Admitting: Cardiology

## 2015-03-24 NOTE — Telephone Encounter (Signed)
LMOVM reminding pt to send remote transmission.   

## 2015-03-26 ENCOUNTER — Encounter: Payer: Self-pay | Admitting: Cardiology

## 2015-06-18 ENCOUNTER — Emergency Department (HOSPITAL_COMMUNITY): Payer: Medicare Other

## 2015-06-18 ENCOUNTER — Encounter (HOSPITAL_COMMUNITY): Payer: Self-pay

## 2015-06-18 ENCOUNTER — Emergency Department (HOSPITAL_COMMUNITY)
Admission: EM | Admit: 2015-06-18 | Discharge: 2015-06-18 | Disposition: A | Discharge status: Home / Self Care | Payer: Medicare Other | Attending: Emergency Medicine | Admitting: Emergency Medicine

## 2015-06-18 DIAGNOSIS — Z794 Long term (current) use of insulin: Secondary | ICD-10-CM | POA: Insufficient documentation

## 2015-06-18 DIAGNOSIS — Z950 Presence of cardiac pacemaker: Secondary | ICD-10-CM | POA: Insufficient documentation

## 2015-06-18 DIAGNOSIS — Y999 Unspecified external cause status: Secondary | ICD-10-CM | POA: Diagnosis not present

## 2015-06-18 DIAGNOSIS — W010XXA Fall on same level from slipping, tripping and stumbling without subsequent striking against object, initial encounter: Secondary | ICD-10-CM | POA: Insufficient documentation

## 2015-06-18 DIAGNOSIS — F17210 Nicotine dependence, cigarettes, uncomplicated: Secondary | ICD-10-CM | POA: Insufficient documentation

## 2015-06-18 DIAGNOSIS — I10 Essential (primary) hypertension: Secondary | ICD-10-CM | POA: Diagnosis not present

## 2015-06-18 DIAGNOSIS — I739 Peripheral vascular disease, unspecified: Secondary | ICD-10-CM | POA: Insufficient documentation

## 2015-06-18 DIAGNOSIS — Y9301 Activity, walking, marching and hiking: Secondary | ICD-10-CM | POA: Insufficient documentation

## 2015-06-18 DIAGNOSIS — S065X0A Traumatic subdural hemorrhage without loss of consciousness, initial encounter: Secondary | ICD-10-CM | POA: Diagnosis not present

## 2015-06-18 DIAGNOSIS — E1140 Type 2 diabetes mellitus with diabetic neuropathy, unspecified: Secondary | ICD-10-CM | POA: Diagnosis not present

## 2015-06-18 DIAGNOSIS — E785 Hyperlipidemia, unspecified: Secondary | ICD-10-CM | POA: Insufficient documentation

## 2015-06-18 DIAGNOSIS — I110 Hypertensive heart disease with heart failure: Secondary | ICD-10-CM | POA: Insufficient documentation

## 2015-06-18 DIAGNOSIS — S098XXA Other specified injuries of head, initial encounter: Secondary | ICD-10-CM | POA: Diagnosis not present

## 2015-06-18 DIAGNOSIS — S0191XA Laceration without foreign body of unspecified part of head, initial encounter: Secondary | ICD-10-CM | POA: Diagnosis present

## 2015-06-18 DIAGNOSIS — I5042 Chronic combined systolic (congestive) and diastolic (congestive) heart failure: Secondary | ICD-10-CM | POA: Insufficient documentation

## 2015-06-18 DIAGNOSIS — S0993XA Unspecified injury of face, initial encounter: Secondary | ICD-10-CM | POA: Diagnosis not present

## 2015-06-18 DIAGNOSIS — Y92410 Unspecified street and highway as the place of occurrence of the external cause: Secondary | ICD-10-CM | POA: Insufficient documentation

## 2015-06-18 DIAGNOSIS — Z7982 Long term (current) use of aspirin: Secondary | ICD-10-CM | POA: Diagnosis not present

## 2015-06-18 DIAGNOSIS — S0990XA Unspecified injury of head, initial encounter: Secondary | ICD-10-CM

## 2015-06-18 DIAGNOSIS — J45909 Unspecified asthma, uncomplicated: Secondary | ICD-10-CM | POA: Diagnosis not present

## 2015-06-18 DIAGNOSIS — E1129 Type 2 diabetes mellitus with other diabetic kidney complication: Secondary | ICD-10-CM | POA: Insufficient documentation

## 2015-06-18 DIAGNOSIS — T148 Other injury of unspecified body region: Secondary | ICD-10-CM | POA: Diagnosis not present

## 2015-06-18 DIAGNOSIS — S199XXA Unspecified injury of neck, initial encounter: Secondary | ICD-10-CM | POA: Diagnosis not present

## 2015-06-18 LAB — PROTIME-INR
INR: 1.04 (ref 0.00–1.49)
Prothrombin Time: 13.8 seconds (ref 11.6–15.2)

## 2015-06-18 LAB — CBC WITH DIFFERENTIAL/PLATELET
BASOS ABS: 0 10*3/uL (ref 0.0–0.1)
BASOS PCT: 1 %
Eosinophils Absolute: 0.1 10*3/uL (ref 0.0–0.7)
Eosinophils Relative: 2 %
HEMATOCRIT: 34.4 % — AB (ref 39.0–52.0)
Hemoglobin: 11.5 g/dL — ABNORMAL LOW (ref 13.0–17.0)
LYMPHS PCT: 23 %
Lymphs Abs: 1.5 10*3/uL (ref 0.7–4.0)
MCH: 30.6 pg (ref 26.0–34.0)
MCHC: 33.4 g/dL (ref 30.0–36.0)
MCV: 91.5 fL (ref 78.0–100.0)
Monocytes Absolute: 0.3 10*3/uL (ref 0.1–1.0)
Monocytes Relative: 5 %
NEUTROS ABS: 4.4 10*3/uL (ref 1.7–7.7)
Neutrophils Relative %: 70 %
PLATELETS: 101 10*3/uL — AB (ref 150–400)
RBC: 3.76 MIL/uL — AB (ref 4.22–5.81)
RDW: 13.6 % (ref 11.5–15.5)
WBC: 6.4 10*3/uL (ref 4.0–10.5)

## 2015-06-18 LAB — BASIC METABOLIC PANEL
ANION GAP: 8 (ref 5–15)
BUN: 24 mg/dL — ABNORMAL HIGH (ref 6–20)
CALCIUM: 9.3 mg/dL (ref 8.9–10.3)
CO2: 22 mmol/L (ref 22–32)
Chloride: 107 mmol/L (ref 101–111)
Creatinine, Ser: 1.75 mg/dL — ABNORMAL HIGH (ref 0.61–1.24)
GFR, EST AFRICAN AMERICAN: 39 mL/min — AB (ref >60)
GFR, EST NON AFRICAN AMERICAN: 34 mL/min — AB (ref >60)
GLUCOSE: 210 mg/dL — AB (ref 65–99)
POTASSIUM: 5.2 mmol/L — AB (ref 3.5–5.1)
Sodium: 137 mmol/L (ref 135–145)

## 2015-06-18 MED ORDER — ACETAMINOPHEN 325 MG PO TABS
650.0000 mg | ORAL_TABLET | Freq: Once | ORAL | Status: DC
Start: 2015-06-18 — End: 2015-06-18
  Filled 2015-06-18: qty 2

## 2015-06-18 NOTE — Discharge Instructions (Signed)
You were seen and evaluated today regarding your head injury. There is a small amount of blood with in your skull in what's called the subdural space. This is not pushing on the brain at this time. There is a small chance that this could get bigger and cause issues. If you have any change in mental status you need to return to the [facility] emergency department immediately for repeat imaging and reevaluation. You need to make a follow-up appointment in 1 week with the neurosurgeon whose information is provided. They will arrange for an outpatient head CT to be done before your appointment. Do not take any aspirin or aspirin-containing products for the next week and a half. Also return for any new injury to your head.  Head Injury, Adult You have received a head injury. It does not appear serious at this time. Headaches and vomiting are common following head injury. It should be easy to awaken from sleeping. Sometimes it is necessary for you to stay in the emergency department for a while for observation. Sometimes admission to the hospital may be needed. After injuries such as yours, most problems occur within the first 24 hours, but side effects may occur up to 7-10 days after the injury. It is important for you to carefully monitor your condition and contact your health care provider or seek immediate medical care if there is a change in your condition. WHAT ARE THE TYPES OF HEAD INJURIES? Head injuries can be as minor as a bump. Some head injuries can be more severe. More severe head injuries include:  A jarring injury to the brain (concussion).  A bruise of the brain (contusion). This mean there is bleeding in the brain that can cause swelling.  A cracked skull (skull fracture).  Bleeding in the brain that collects, clots, and forms a bump (hematoma). WHAT CAUSES A HEAD INJURY? A serious head injury is most likely to happen to someone who is in a car wreck and is not wearing a seat belt. Other  causes of major head injuries include bicycle or motorcycle accidents, sports injuries, and falls. HOW ARE HEAD INJURIES DIAGNOSED? A complete history of the event leading to the injury and your current symptoms will be helpful in diagnosing head injuries. Many times, pictures of the brain, such as CT or MRI are needed to see the extent of the injury. Often, an overnight hospital stay is necessary for observation.  WHEN SHOULD I SEEK IMMEDIATE MEDICAL CARE?  You should get help right away if:  You have confusion or drowsiness.  You feel sick to your stomach (nauseous) or have continued, forceful vomiting.  You have dizziness or unsteadiness that is getting worse.  You have severe, continued headaches not relieved by medicine. Only take over-the-counter or prescription medicines for pain, fever, or discomfort as directed by your health care provider.  You do not have normal function of the arms or legs or are unable to walk.  You notice changes in the black spots in the center of the colored part of your eye (pupil).  You have a clear or bloody fluid coming from your nose or ears.  You have a loss of vision. During the next 24 hours after the injury, you must stay with someone who can watch you for the warning signs. This person should contact local emergency services (911 in the [place] if you have seizures, you become unconscious, or you are unable to wake up. HOW CAN I PREVENT A HEAD INJURY IN  THE FUTURE? The most important factor for preventing major head injuries is avoiding motor vehicle accidents. To minimize the potential for damage to your head, it is crucial to wear seat belts while riding in motor vehicles. Wearing helmets while bike riding and playing collision sports (like football) is also helpful. Also, avoiding dangerous activities around the house will further help reduce your risk of head injury.  WHEN CAN I RETURN TO NORMAL ACTIVITIES AND ATHLETICS? You should be reevaluated  by your health care provider before returning to these activities. If you have any of the following symptoms, you should not return to activities or contact sports until 1 week after the symptoms have stopped:  Persistent headache.  Dizziness or vertigo.  Poor attention and concentration.  Confusion.  Memory problems.  Nausea or vomiting.  Fatigue or tire easily.  Irritability.  Intolerant of bright lights or loud noises.  Anxiety or depression.  Disturbed sleep. MAKE SURE YOU:   Understand these instructions.  Will watch your condition.  Will get help right away if you are not doing well or get worse.   This information is not intended to replace advice given to you by your health care provider. Make sure you discuss any questions you have with your health care provider.   Document Released: 01/18/2005 Document Revised: 02/08/2014 Document Reviewed: 09/25/2012 Elsevier Interactive Patient Education [employer]2016 [employer].

## 2015-06-18 NOTE — ED Notes (Signed)
MD at bedside. 

## 2015-06-18 NOTE — ED Notes (Signed)
FAMILY AWARE OF RESULTS PER EDP. OK WITH TAKING PT HOME

## 2015-06-18 NOTE — ED Notes (Signed)
Pt resides at home. Per [facility]- Pt reports walking trash can to street and tripped on concrete driveway. No LOC. 1 cc laceration above left eye/forehead. Bleeding controlled. Square shaped skin tear to left forearm. Bleeding controlled. Dressing applied by EMS. Denies any complaints. Denies neck or back pain, chest or SOB.

## 2015-06-18 NOTE — ED Notes (Signed)
DID NOT PERFORM STROKE SWALLOW SCREEN . PT BEING KEPT NPO. CT SCAN- ABNORMAL

## 2015-06-18 NOTE — ED Provider Notes (Signed)
CSN: [id number]     Arrival date & time 06/18/15  1308 History   First MD Initiated Contact with Patient 06/18/15 0945     Chief Complaint  Patient presents with  . Fall  . Facial Laceration     (Consider location/radiation/quality/duration/timing/severity/associated sxs/prior Treatment) HPI Comments: 82 year old male with history of dementia, complicated cardiac history, diabetes mellitus presents following a fall. The patient reportedly uses his walker and walks down the driveway to get a newspaper almost every morning. This morning the patient did this but the trash cans were still out there and attempted to drag her trash cans back to the house. The patient sustained a fall while attempting to do this. Family was home with him. The patient did not lose consciousness. Patient is pleasant and alert during evaluation. He denies any pain. He is not able to report the exact details of the fall. Family is with the patient and able to fill in details.   Past Medical History  Diagnosis Date  . Type II or unspecified type diabetes mellitus without mention of complication, uncontrolled   . Essential hypertension   . Mental disorder   . Dementia   . Peripheral neuropathy (HCC)   . Allergic rhinitis   . Asthma with bronchitis   . Complete heart block (HCC)     a. Requiring permanent [employer] DDD pacemaker by Dr. Ladona Ridgel in 2013.  Marland Kitchen Peripheral vascular disease, unspecified (HCC)   . Cardiac pacemaker in situ   . Mixed hyperlipidemia   . Chronic combined systolic and diastolic CHF (congestive heart failure) (HCC)     a. EF noted to be down 10/2014 - 2D echo 10/17/14: EF 35-40%, mild LVH,  moderate HK of mid-apical anteroseptal and anterior myocardium, HK of inferior myocardium, grade 1 DD, mild MR.  . Diabetes mellitus with renal complications (HCC)   . Dementia   . BPH (benign prostatic hyperplasia)   . General weakness   . Anemia of chronic disease   . Thrombocytopenia [facility])    Past  Surgical History  Procedure Laterality Date  . Rotator cuff repair    . Pacemaker insertion  Dec 2013  . Permanent pacemaker insertion N/A 01/31/2012    Procedure: PERMANENT PACEMAKER INSERTION;  Surgeon: Marinus Maw, MD;  Location: [facility] CATH LAB;  Service: Cardiovascular;  Laterality: N/A;   Family History  Problem Relation Age of Onset  . Heart disease Mother   . Alzheimer's disease Father   . Diabetes Sister   . COPD Brother   . Alzheimer's disease Sister    Social History  Substance Use Topics  . Smoking status: Former Smoker    Types: Cigarettes  . Smokeless tobacco: Never Used  . Alcohol Use: No    Review of Systems  Unable to perform ROS: Dementia  HENT: Negative for ear pain.   Eyes: Negative for pain.  Cardiovascular: Negative for chest pain.  Gastrointestinal: Negative for nausea, vomiting and abdominal pain.  Genitourinary: Negative for flank pain.  Musculoskeletal: Negative for back pain and neck pain.  Skin: Positive for wound.  Neurological: Negative for syncope and headaches.  Hematological: Bruises/bleeds easily (on aspirin, not on anticoagulation).      Allergies  Actos and Ramipril  Home Medications   Prior to Admission medications   Medication Sig Start Date End Date Taking? Authorizing Provider  aspirin EC 81 MG tablet Take 81 mg by mouth at bedtime.   Yes Historical Provider, MD  carvedilol (COREG) 3.125 MG tablet Take  2 tablets (6.25 mg total) by mouth 2 (two) times daily. 01/06/15  Yes Lyn Records, MD  donepezil (ARICEPT) 10 MG tablet Take 1 tablet (10 mg total) by mouth at bedtime. 02/19/13  Yes Elenora Gamma, MD  finasteride (PROSCAR) 5 MG tablet TAKE 1 TABLET BY MOUTH EVERY DAY   Yes Elenora Gamma, MD  insulin glargine (LANTUS) 100 UNIT/ML injection Inject 0.5 mLs (50 Units total) into the skin every morning. 01/07/15  Yes Kathee Delton, MD  memantine (NAMENDA) 10 MG tablet TAKE 1 TABLET BY MOUTH AT BEDTIME 03/04/15  Yes Kathee Delton,  MD  mirtazapine (REMERON) 15 MG tablet Take 7.5 mg by mouth at bedtime.   Yes Historical Provider, MD  rosuvastatin (CRESTOR) 5 MG tablet Take 1 tablet (5 mg total) by mouth at bedtime. 02/19/13  Yes Elenora Gamma, MD  sertraline (ZOLOFT) 100 MG tablet Take 1 tablet (100 mg total) by mouth at bedtime. 02/19/13  Yes Elenora Gamma, MD  glucose blood test strip Once daily 04/16/13   Elenora Gamma, MD  [facility] DELICA LANCETS 33G MISC 1 Device by Does not apply route 4 (four) times daily as needed. 02/19/13   Elenora Gamma, MD   BP 175/75 mmHg  Pulse 76  Temp(Src) 98.9 F (37.2 C) (Oral)  Resp 19  SpO2 98% Physical Exam  Constitutional: He appears well-developed and well-nourished. No distress.  HENT:  Head: Normocephalic.    Right Ear: External ear normal.  Left Ear: External ear normal.  Mouth/Throat: Oropharynx is clear and moist. No oropharyngeal exudate.  Eyes: EOM are normal. Pupils are equal, round, and reactive to light. Right conjunctiva is not injected. Right conjunctiva has no hemorrhage. Left conjunctiva is not injected. Left conjunctiva has no hemorrhage.  Full extraocular movements without pain or difficulty  Neck: Normal range of motion and full passive range of motion without pain. Neck supple. No spinous process tenderness and no muscular tenderness present. Normal range of motion present.  Cardiovascular: Normal rate, regular rhythm, normal heart sounds and intact distal pulses.   No murmur heard. Pulmonary/Chest: Effort normal. No respiratory distress. He has no wheezes. He has no rales.  Abdominal: Soft. He exhibits no distension. There is no tenderness.  Musculoskeletal: He exhibits no edema.  Neurological: He is alert.  Patient alert, answering basic questions. Oriented to being at a hospital in to his name. Per family and mental status baseline. Patient able to move all extremities without difficulty. Normal strength in all extremities. Normal sensation  on the face. No cranial nerve deficit.  Skin: Skin is warm and dry. Abrasion and laceration (skin tear) noted. No rash noted. He is not diaphoretic.     Vitals reviewed.   ED Course  Procedures (including critical care time) Labs Review Labs Reviewed  CBC WITH DIFFERENTIAL/PLATELET - Abnormal; Notable for the following:    RBC 3.76 (*)    Hemoglobin 11.5 (*)    HCT 34.4 (*)    Platelets 101 (*)    All other components within normal limits  BASIC METABOLIC PANEL - Abnormal; Notable for the following:    Potassium 5.2 (*)    Glucose, Bld 210 (*)    BUN 24 (*)    Creatinine, Ser 1.75 (*)    GFR calc non Af Amer 34 (*)    GFR calc Af Amer 39 (*)    All other components within normal limits  PROTIME-INR    Imaging Review Ct Head Wo  Contrast  06/18/2015  CLINICAL DATA:  Status post trip and fall today. Laceration above the left eye. Initial encounter. EXAM: CT HEAD WITHOUT CONTRAST CT CERVICAL SPINE WITHOUT CONTRAST TECHNIQUE: Multidetector CT imaging of the head and cervical spine was performed following the standard protocol without intravenous contrast. Multiplanar CT image reconstructions of the cervical spine were also generated. COMPARISON:  Head CT scan 10/22/2014. FINDINGS: CT HEAD FINDINGS A small amount of subdural blood is seen along the left side of the falx and left tentorium. No other acute intracranial abnormality including hemorrhage, infarct, mass lesion, mass effect, midline shift or hydrocephalus is identified. There is no pneumocephalus. Imaged paranasal sinuses and mastoid air cells are clear. Laceration above the left eye is not well seen on this study. CT CERVICAL SPINE FINDINGS Trace anterolisthesis C4 on C5 is due to facet degenerative disease. Loss of disc space height is most notable at C5-6 and C6-7. Lung apices are clear. IMPRESSION: Small amount of subdural blood along the left side of the falx and left tentorium. No other acute intracranial abnormality. No acute  abnormality cervical spine. Atrophy and chronic microvascular ischemic change. Degenerative disc disease C5-6 and C6-7. These results were called by telephone at the time of interpretation on 06/18/2015 at 10:53 am to Dr. Tyrone AppleEMILY NGUYEN , Critical Value/emergent results were called by telephone at the time of interpretation on 06/18/2015 at 10:53 am to Dr. Tyrone AppleEMILY NGUYEN , who verbally acknowledged these results. Electronically Signed   By: Drusilla Kannerhomas  Dalessio M.D.   On: 06/18/2015 10:55   Ct Cervical Spine Wo Contrast  06/18/2015  CLINICAL DATA:  Status post trip and fall today. Laceration above the left eye. Initial encounter. EXAM: CT HEAD WITHOUT CONTRAST CT CERVICAL SPINE WITHOUT CONTRAST TECHNIQUE: Multidetector CT imaging of the head and cervical spine was performed following the standard protocol without intravenous contrast. Multiplanar CT image reconstructions of the cervical spine were also generated. COMPARISON:  Head CT scan 10/22/2014. FINDINGS: CT HEAD FINDINGS A small amount of subdural blood is seen along the left side of the falx and left tentorium. No other acute intracranial abnormality including hemorrhage, infarct, mass lesion, mass effect, midline shift or hydrocephalus is identified. There is no pneumocephalus. Imaged paranasal sinuses and mastoid air cells are clear. Laceration above the left eye is not well seen on this study. CT CERVICAL SPINE FINDINGS Trace anterolisthesis C4 on C5 is due to facet degenerative disease. Loss of disc space height is most notable at C5-6 and C6-7. Lung apices are clear. IMPRESSION: Small amount of subdural blood along the left side of the falx and left tentorium. No other acute intracranial abnormality. No acute abnormality cervical spine. Atrophy and chronic microvascular ischemic change. Degenerative disc disease C5-6 and C6-7. These results were called by telephone at the time of interpretation on 06/18/2015 at 10:53 am to Dr. Tyrone AppleEMILY NGUYEN , Critical  Value/emergent results were called by telephone at the time of interpretation on 06/18/2015 at 10:53 am to Dr. Tyrone AppleEMILY NGUYEN , who verbally acknowledged these results. Electronically Signed   By: Drusilla Kannerhomas  Dalessio M.D.   On: 06/18/2015 10:55   I have personally reviewed and evaluated these images and lab results as part of my medical decision-making.   EKG Interpretation None      MDM  Patient seen and evaluated in stable condition. Normal neurologic examination. Appears to have sustained a mechanical fall. CT of the head revealed small amount of subdural blood. This was discussed on the phone directly with radiology and  then directly with Dr. Newell Coral from neurosurgery. Dr. Newell Coral said that this time the patient is stable and will be able to be watched closely at home the patient could go home and follow-up in one week for repeat head CT in for evaluation by himself in his office. I had a very lengthy discussion at the bedside with the patient's son who is his primary caregiver as well as discussed with his higher caregiver the recommendations. They felt very comfortable watching the patient at home. They said that they would take him immediately to [facility] if there was any question of a change in mental status. They were also informed that Dr. Newell Coral did not want the patient to be taking any aspirin or aspirin-containing products over the next week and a half. They expressed understanding of this. Patient was able to stand and ambulate without difficulty or pain. Patient was discharged home in stable condition with strict return precautions. Final diagnoses:  Head injury, initial encounter    1. Head injury, small subdural bleed    Leta Baptist, MD 06/18/15 1308

## 2015-06-23 ENCOUNTER — Other Ambulatory Visit: Payer: Self-pay | Admitting: Interventional Cardiology

## 2015-06-23 ENCOUNTER — Other Ambulatory Visit: Payer: Self-pay | Admitting: Family Medicine

## 2015-06-26 ENCOUNTER — Other Ambulatory Visit: Payer: Self-pay | Admitting: Neurosurgery

## 2015-06-26 DIAGNOSIS — S065XAA Traumatic subdural hemorrhage with loss of consciousness status unknown, initial encounter: Secondary | ICD-10-CM

## 2015-06-26 DIAGNOSIS — S065X9A Traumatic subdural hemorrhage with loss of consciousness of unspecified duration, initial encounter: Secondary | ICD-10-CM

## 2015-08-07 ENCOUNTER — Encounter (HOSPITAL_COMMUNITY): Payer: Self-pay | Admitting: Emergency Medicine

## 2015-08-07 ENCOUNTER — Inpatient Hospital Stay (HOSPITAL_COMMUNITY)
Admission: EM | Admit: 2015-08-07 | Discharge: 2015-08-14 | DRG: 956 | Disposition: A | Discharge status: Skilled Nursing Facility | Payer: Medicare Other | Attending: Internal Medicine | Admitting: Internal Medicine

## 2015-08-07 ENCOUNTER — Emergency Department (HOSPITAL_COMMUNITY): Payer: Medicare Other

## 2015-08-07 DIAGNOSIS — S065X9A Traumatic subdural hemorrhage with loss of consciousness of unspecified duration, initial encounter: Secondary | ICD-10-CM | POA: Diagnosis not present

## 2015-08-07 DIAGNOSIS — H9190 Unspecified hearing loss, unspecified ear: Secondary | ICD-10-CM | POA: Diagnosis present

## 2015-08-07 DIAGNOSIS — Z79899 Other long term (current) drug therapy: Secondary | ICD-10-CM

## 2015-08-07 DIAGNOSIS — Z794 Long term (current) use of insulin: Secondary | ICD-10-CM

## 2015-08-07 DIAGNOSIS — D631 Anemia in chronic kidney disease: Secondary | ICD-10-CM | POA: Diagnosis present

## 2015-08-07 DIAGNOSIS — S065XAA Traumatic subdural hemorrhage with loss of consciousness status unknown, initial encounter: Secondary | ICD-10-CM

## 2015-08-07 DIAGNOSIS — I130 Hypertensive heart and chronic kidney disease with heart failure and stage 1 through stage 4 chronic kidney disease, or unspecified chronic kidney disease: Secondary | ICD-10-CM | POA: Diagnosis not present

## 2015-08-07 DIAGNOSIS — I5042 Chronic combined systolic (congestive) and diastolic (congestive) heart failure: Secondary | ICD-10-CM | POA: Diagnosis present

## 2015-08-07 DIAGNOSIS — I6200 Nontraumatic subdural hemorrhage, unspecified: Secondary | ICD-10-CM | POA: Diagnosis not present

## 2015-08-07 DIAGNOSIS — R296 Repeated falls: Secondary | ICD-10-CM | POA: Diagnosis present

## 2015-08-07 DIAGNOSIS — IMO0002 Reserved for concepts with insufficient information to code with codable children: Secondary | ICD-10-CM | POA: Diagnosis present

## 2015-08-07 DIAGNOSIS — Z87891 Personal history of nicotine dependence: Secondary | ICD-10-CM

## 2015-08-07 DIAGNOSIS — R059 Cough, unspecified: Secondary | ICD-10-CM

## 2015-08-07 DIAGNOSIS — N390 Urinary tract infection, site not specified: Secondary | ICD-10-CM | POA: Diagnosis present

## 2015-08-07 DIAGNOSIS — I619 Nontraumatic intracerebral hemorrhage, unspecified: Secondary | ICD-10-CM

## 2015-08-07 DIAGNOSIS — Y92009 Unspecified place in unspecified non-institutional (private) residence as the place of occurrence of the external cause: Secondary | ICD-10-CM

## 2015-08-07 DIAGNOSIS — N183 Chronic kidney disease, stage 3 unspecified: Secondary | ICD-10-CM | POA: Diagnosis present

## 2015-08-07 DIAGNOSIS — D696 Thrombocytopenia, unspecified: Secondary | ICD-10-CM | POA: Diagnosis present

## 2015-08-07 DIAGNOSIS — J209 Acute bronchitis, unspecified: Secondary | ICD-10-CM | POA: Diagnosis present

## 2015-08-07 DIAGNOSIS — R05 Cough: Secondary | ICD-10-CM

## 2015-08-07 DIAGNOSIS — D62 Acute posthemorrhagic anemia: Secondary | ICD-10-CM | POA: Diagnosis not present

## 2015-08-07 DIAGNOSIS — B961 Klebsiella pneumoniae [K. pneumoniae] as the cause of diseases classified elsewhere: Secondary | ICD-10-CM | POA: Diagnosis present

## 2015-08-07 DIAGNOSIS — D638 Anemia in other chronic diseases classified elsewhere: Secondary | ICD-10-CM | POA: Diagnosis present

## 2015-08-07 DIAGNOSIS — Z950 Presence of cardiac pacemaker: Secondary | ICD-10-CM | POA: Diagnosis present

## 2015-08-07 DIAGNOSIS — S0101XA Laceration without foreign body of scalp, initial encounter: Secondary | ICD-10-CM | POA: Diagnosis present

## 2015-08-07 DIAGNOSIS — E1129 Type 2 diabetes mellitus with other diabetic kidney complication: Secondary | ICD-10-CM | POA: Diagnosis present

## 2015-08-07 DIAGNOSIS — S199XXA Unspecified injury of neck, initial encounter: Secondary | ICD-10-CM | POA: Diagnosis not present

## 2015-08-07 DIAGNOSIS — J45909 Unspecified asthma, uncomplicated: Secondary | ICD-10-CM | POA: Diagnosis present

## 2015-08-07 DIAGNOSIS — S72001S Fracture of unspecified part of neck of right femur, sequela: Secondary | ICD-10-CM

## 2015-08-07 DIAGNOSIS — Z66 Do not resuscitate: Secondary | ICD-10-CM | POA: Diagnosis present

## 2015-08-07 DIAGNOSIS — Z01810 Encounter for preprocedural cardiovascular examination: Secondary | ICD-10-CM

## 2015-08-07 DIAGNOSIS — S72001A Fracture of unspecified part of neck of right femur, initial encounter for closed fracture: Secondary | ICD-10-CM | POA: Diagnosis not present

## 2015-08-07 DIAGNOSIS — E782 Mixed hyperlipidemia: Secondary | ICD-10-CM | POA: Diagnosis present

## 2015-08-07 DIAGNOSIS — I10 Essential (primary) hypertension: Secondary | ICD-10-CM | POA: Diagnosis present

## 2015-08-07 DIAGNOSIS — E440 Moderate protein-calorie malnutrition: Secondary | ICD-10-CM | POA: Diagnosis not present

## 2015-08-07 DIAGNOSIS — M25551 Pain in right hip: Secondary | ICD-10-CM | POA: Diagnosis not present

## 2015-08-07 DIAGNOSIS — Z7982 Long term (current) use of aspirin: Secondary | ICD-10-CM

## 2015-08-07 DIAGNOSIS — E1142 Type 2 diabetes mellitus with diabetic polyneuropathy: Secondary | ICD-10-CM | POA: Diagnosis present

## 2015-08-07 DIAGNOSIS — E1165 Type 2 diabetes mellitus with hyperglycemia: Secondary | ICD-10-CM | POA: Diagnosis present

## 2015-08-07 DIAGNOSIS — S299XXA Unspecified injury of thorax, initial encounter: Secondary | ICD-10-CM | POA: Diagnosis not present

## 2015-08-07 DIAGNOSIS — W19XXXA Unspecified fall, initial encounter: Secondary | ICD-10-CM | POA: Diagnosis present

## 2015-08-07 DIAGNOSIS — R52 Pain, unspecified: Secondary | ICD-10-CM

## 2015-08-07 DIAGNOSIS — F0390 Unspecified dementia without behavioral disturbance: Secondary | ICD-10-CM | POA: Diagnosis present

## 2015-08-07 DIAGNOSIS — S065X0A Traumatic subdural hemorrhage without loss of consciousness, initial encounter: Secondary | ICD-10-CM | POA: Diagnosis not present

## 2015-08-07 DIAGNOSIS — E1151 Type 2 diabetes mellitus with diabetic peripheral angiopathy without gangrene: Secondary | ICD-10-CM | POA: Diagnosis present

## 2015-08-07 DIAGNOSIS — N400 Enlarged prostate without lower urinary tract symptoms: Secondary | ICD-10-CM | POA: Diagnosis present

## 2015-08-07 DIAGNOSIS — R627 Adult failure to thrive: Secondary | ICD-10-CM | POA: Diagnosis present

## 2015-08-07 DIAGNOSIS — E1122 Type 2 diabetes mellitus with diabetic chronic kidney disease: Secondary | ICD-10-CM | POA: Diagnosis present

## 2015-08-07 LAB — URINALYSIS, ROUTINE W REFLEX MICROSCOPIC
Bilirubin Urine: NEGATIVE
Glucose, UA: NEGATIVE mg/dL
Ketones, ur: NEGATIVE mg/dL
NITRITE: POSITIVE — AB
PH: 6 (ref 5.0–8.0)
Protein, ur: 100 mg/dL — AB
SPECIFIC GRAVITY, URINE: 1.016 (ref 1.005–1.030)

## 2015-08-07 LAB — URINE MICROSCOPIC-ADD ON

## 2015-08-07 MED ORDER — SODIUM CHLORIDE 0.9 % IV SOLN
INTRAVENOUS | Status: DC
  Administered 2015-08-07: 125 mL/h via INTRAVENOUS

## 2015-08-07 MED ORDER — ONDANSETRON HCL 4 MG/2ML IJ SOLN
4.0000 mg | Freq: Once | INTRAMUSCULAR | Status: AC
  Administered 2015-08-07: 4 mg via INTRAVENOUS
  Filled 2015-08-07: qty 2

## 2015-08-07 MED ORDER — FENTANYL CITRATE (PF) 100 MCG/2ML IJ SOLN
50.0000 ug | Freq: Once | INTRAMUSCULAR | Status: AC
  Administered 2015-08-07: 50 ug via INTRAVENOUS
  Filled 2015-08-07: qty 2

## 2015-08-07 NOTE — ED Notes (Signed)
Patient transported to CT 

## 2015-08-07 NOTE — ED Provider Notes (Signed)
CSN: [id number]     Arrival date & time 08/07/15  2221 History   First MD Initiated Contact with Patient 08/07/15 2239     Chief Complaint  Patient presents with  . Fall  . Head Injury     (Consider location/radiation/quality/duration/timing/severity/associated sxs/prior Treatment) HPI   Dakota White is a 82 y.o. male who presents for evaluation of injuries from fall. His son is here with him and states that the patient was ambulating, in their home, without his walker, and fell. His son believes that the reason for the fall was not using a walker. Patient injured his head and his right hip in the fall. There is no concern for loss of consciousness. He was found by a family member shortly after the fall, when the family member heard him fall. No recent illnesses. He sees his doctors regularly.  Level V caveat- dementia   Past Medical History  Diagnosis Date  . Type II or unspecified type diabetes mellitus without mention of complication, uncontrolled   . Essential hypertension   . Mental disorder   . Dementia   . Peripheral neuropathy (HCC)   . Allergic rhinitis   . Asthma with bronchitis   . Complete heart block (HCC)     a. Requiring permanent [employer] DDD pacemaker by Dr. Ladona Ridgelaylor in 2013.  Marland Kitchen. Peripheral vascular disease, unspecified (HCC)   . Cardiac pacemaker in situ   . Mixed hyperlipidemia   . Chronic combined systolic and diastolic CHF (congestive heart failure) (HCC)     a. EF noted to be down 10/2014 - 2D echo 10/17/14: EF 35-40%, mild LVH,  moderate HK of mid-apical anteroseptal and anterior myocardium, HK of inferior myocardium, grade 1 DD, mild MR.  . Diabetes mellitus with renal complications (HCC)   . Dementia   . BPH (benign prostatic hyperplasia)   . General weakness   . Anemia of chronic disease   . Thrombocytopenia [employer](HCC)    Past Surgical History  Procedure Laterality Date  . Rotator cuff repair    . Pacemaker insertion  Dec 2013  . Permanent pacemaker  insertion N/A 01/31/2012    Procedure: PERMANENT PACEMAKER INSERTION;  Surgeon: Marinus MawGregg W Taylor, MD;  [facility];  Service: Cardiovascular;  Laterality: N/A;   Family History  Problem Relation Age of Onset  . Heart disease Mother   . Alzheimer's disease Father   . Diabetes Sister   . COPD Brother   . Alzheimer's disease Sister    Social History  Substance Use Topics  . Smoking status: Former Smoker    Types: Cigarettes  . Smokeless tobacco: Never Used  . Alcohol Use: No    Review of Systems  Unable to perform ROS: Dementia      Allergies  Actos and Ramipril  Home Medications   Prior to Admission medications   Medication Sig Start Date End Date Taking? Authorizing Provider  aspirin EC 81 MG tablet Take 81 mg by mouth at bedtime.    Historical Provider, MD  carvedilol (COREG) 3.125 MG tablet TAKE 1 TABLET BY MOUTH TWICE DAILY 06/23/15   Lyn RecordsHenry W Smith, MD  donepezil (ARICEPT) 10 MG tablet Take 1 tablet (10 mg total) by mouth at bedtime. 02/19/13   Elenora GammaSamuel White Bradshaw, MD  finasteride (PROSCAR) 5 MG tablet TAKE 1 TABLET BY MOUTH EVERY DAY    Elenora GammaSamuel White Bradshaw, MD  glucose blood test strip Once daily 04/16/13   Elenora GammaSamuel White Bradshaw, MD  insulin glargine (LANTUS)  100 UNIT/ML injection Inject 0.5 mLs (50 Units total) into the skin every morning. 01/07/15   Kathee DeltonIan D McKeag, MD  memantine (NAMENDA) 10 MG tablet TAKE 1 TABLET BY MOUTH EVERY NIGHT AT BEDTIME 06/23/15   Kathee DeltonIan D McKeag, MD  mirtazapine (REMERON) 15 MG tablet Take 7.5 mg by mouth at bedtime.    Historical Provider, MD  [facility] LANCETS 33G MISC 1 Device by Does not apply route 4 (four) times daily as needed. 02/19/13   Elenora GammaSamuel White Bradshaw, MD  rosuvastatin (CRESTOR) 5 MG tablet Take 1 tablet (5 mg total) by mouth at bedtime. 02/19/13   Elenora GammaSamuel White Bradshaw, MD  sertraline (ZOLOFT) 100 MG tablet Take 1 tablet (100 mg total) by mouth at bedtime. 02/19/13   Elenora GammaSamuel White Bradshaw, MD   BP 162/73 mmHg  Pulse 85  Temp(Src) 97.5 F  (36.4 C) (Oral)  Resp 18  SpO2 96% Physical Exam  Constitutional: He is oriented to person, place, and time. He appears well-developed and well-nourished.  HENT:  Head: Normocephalic and atraumatic.  Right Ear: External ear normal.  Left Ear: External ear normal.  Eyes: Conjunctivae and EOM are normal. Pupils are equal, round, and reactive to light.  Neck: Normal range of motion and phonation normal. Neck supple.  Cardiovascular: Normal rate, regular rhythm and normal heart sounds.   Pulmonary/Chest: Effort normal and breath sounds normal. He exhibits no bony tenderness.  Abdominal: Soft. There is no tenderness.  Musculoskeletal: Normal range of motion.  Neurological: He is alert and oriented to person, place, and time. No cranial nerve deficit or sensory deficit. He exhibits normal muscle tone. Coordination normal.  Skin: Skin is warm, dry and intact.  Psychiatric: He has a normal mood and affect. His behavior is normal. Judgment and thought content normal.  Nursing note and vitals reviewed.   ED Course  Procedures (including critical care time) Medications  0.9 %  sodium chloride infusion (not administered)  ondansetron (ZOFRAN) injection 4 mg (not administered)  fentaNYL (SUBLIMAZE) injection 50 mcg (not administered)    Patient Vitals for the past 24 hrs:  BP Temp Temp src Pulse Resp SpO2  08/07/15 2232 162/73 mmHg 97.5 F (36.4 C) Oral 85 18 96 %     23:50- Case discussed with Radiology, Dr. Karin GoldenShogry.   12:00 AM Reevaluation with update and discussion. After initial assessment and treatment, an updated evaluation reveals at this time no clear change in clinical status. The patient's son demanded that I leave the room and not interact with the patient again. He stated that he did not want me to tell him any thing that has been found, and would not state what his concerns were. Dakota White   00:20- Discussed with Neurosurgery, Dr. Jeral FruitBotero, who [facility],  repeat CT in 24 hours, and he will follow the patient. He states that the patient can be operated on later today.  00:30- Discussed with Ortho, Dr. Wandra Feinstein. Murphy, who [facility], to [facility] surgery.   12:36 AM-Consult complete with Hospitalist. Patient case explained and discussed. He agrees to admit patient for further evaluation and treatment. Call ended at 00:40  Scalp laceration repair per PA, Cartner  Labs Review Labs Reviewed  URINALYSIS, ROUTINE W REFLEX MICROSCOPIC (NOT AT [facility]) - Abnormal; Notable for the following:    APPearance TURBID (*)    Hgb urine dipstick SMALL (*)    Protein, ur 100 (*)    Nitrite POSITIVE (*)  Leukocytes, UA LARGE (*)    All other components within normal limits  URINE MICROSCOPIC-ADD ON - Abnormal; Notable for the following:    Squamous Epithelial / LPF 0-5 (*)    Bacteria, UA MANY (*)    All other components within normal limits  URINE CULTURE  BASIC METABOLIC PANEL  CBC WITH DIFFERENTIAL/PLATELET    Imaging Review No results found. I have personally reviewed and evaluated these images and lab results as part of my medical decision-making.   EKG Interpretation None      MDM   Final diagnoses:  Closed right hip fracture, initial encounter (HCC)  Urinary tract infection without hematuria, site unspecified  Scalp laceration, initial encounter  Subdural hematoma (HCC)    Mechanical fall with hip fracture, and reinjury, intracranial bleeding. CT with acute and chronic bleeding. No intracranial shift. No apparent cervical spine injury. All possibly related to urinary tract infection, with subsequent weakness. Scalp injury, see note by, Cartner, PA, regarding treatment.  Nursing Notes Reviewed/ Care Coordinated, and agree without changes. Applicable Imaging Reviewed.  Interpretation of Laboratory Data incorporated into ED treatment   Plan: Admit to Hospitalist, transfer to [facility].  Mancel Bale,  MD 08/09/15 [phone]

## 2015-08-07 NOTE — ED Notes (Addendum)
Per EMS from home  pt. Reported of unwitnessed fall at around 0915 this evening , pt.'s family member found pt. Pt. On the floor, with laceration on the back of his head, bleeding controled. Pt. Has dementia. C-collar in placed. Received a total dose of of IV fentanyl via EMS .  Pt. Alert and oriented x2 upon arrival to ED

## 2015-08-07 NOTE — ED Notes (Signed)
Bed: [id number] Expected date:  Expected time:  Means of arrival:  Comments: Fall, head laceration, hip pain

## 2015-08-08 ENCOUNTER — Encounter (HOSPITAL_COMMUNITY)
Admission: EM | Disposition: A | Discharge status: Skilled Nursing Facility | Payer: Self-pay | Source: Home / Self Care | Attending: Internal Medicine

## 2015-08-08 ENCOUNTER — Encounter (HOSPITAL_COMMUNITY): Payer: Self-pay | Admitting: Internal Medicine

## 2015-08-08 ENCOUNTER — Inpatient Hospital Stay (HOSPITAL_COMMUNITY): Payer: Medicare Other | Admitting: Anesthesiology

## 2015-08-08 DIAGNOSIS — I5042 Chronic combined systolic (congestive) and diastolic (congestive) heart failure: Secondary | ICD-10-CM | POA: Diagnosis not present

## 2015-08-08 DIAGNOSIS — Z794 Long term (current) use of insulin: Secondary | ICD-10-CM | POA: Diagnosis not present

## 2015-08-08 DIAGNOSIS — S72009A Fracture of unspecified part of neck of unspecified femur, initial encounter for closed fracture: Secondary | ICD-10-CM | POA: Insufficient documentation

## 2015-08-08 DIAGNOSIS — S065XAA Traumatic subdural hemorrhage with loss of consciousness status unknown, initial encounter: Secondary | ICD-10-CM | POA: Diagnosis present

## 2015-08-08 DIAGNOSIS — H9190 Unspecified hearing loss, unspecified ear: Secondary | ICD-10-CM | POA: Diagnosis present

## 2015-08-08 DIAGNOSIS — Z96641 Presence of right artificial hip joint: Secondary | ICD-10-CM | POA: Diagnosis not present

## 2015-08-08 DIAGNOSIS — W19XXXA Unspecified fall, initial encounter: Secondary | ICD-10-CM

## 2015-08-08 DIAGNOSIS — Z01810 Encounter for preprocedural cardiovascular examination: Secondary | ICD-10-CM

## 2015-08-08 DIAGNOSIS — S72041A Displaced fracture of base of neck of right femur, initial encounter for closed fracture: Secondary | ICD-10-CM | POA: Diagnosis not present

## 2015-08-08 DIAGNOSIS — F0390 Unspecified dementia without behavioral disturbance: Secondary | ICD-10-CM | POA: Diagnosis not present

## 2015-08-08 DIAGNOSIS — E1165 Type 2 diabetes mellitus with hyperglycemia: Secondary | ICD-10-CM | POA: Diagnosis present

## 2015-08-08 DIAGNOSIS — N400 Enlarged prostate without lower urinary tract symptoms: Secondary | ICD-10-CM | POA: Diagnosis present

## 2015-08-08 DIAGNOSIS — M6281 Muscle weakness (generalized): Secondary | ICD-10-CM | POA: Diagnosis not present

## 2015-08-08 DIAGNOSIS — E782 Mixed hyperlipidemia: Secondary | ICD-10-CM | POA: Diagnosis present

## 2015-08-08 DIAGNOSIS — D696 Thrombocytopenia, unspecified: Secondary | ICD-10-CM | POA: Diagnosis present

## 2015-08-08 DIAGNOSIS — S72001A Fracture of unspecified part of neck of right femur, initial encounter for closed fracture: Principal | ICD-10-CM | POA: Diagnosis present

## 2015-08-08 DIAGNOSIS — I10 Essential (primary) hypertension: Secondary | ICD-10-CM | POA: Diagnosis not present

## 2015-08-08 DIAGNOSIS — S72001S Fracture of unspecified part of neck of right femur, sequela: Secondary | ICD-10-CM | POA: Diagnosis not present

## 2015-08-08 DIAGNOSIS — J45909 Unspecified asthma, uncomplicated: Secondary | ICD-10-CM | POA: Diagnosis present

## 2015-08-08 DIAGNOSIS — D638 Anemia in other chronic diseases classified elsewhere: Secondary | ICD-10-CM | POA: Diagnosis not present

## 2015-08-08 DIAGNOSIS — E1122 Type 2 diabetes mellitus with diabetic chronic kidney disease: Secondary | ICD-10-CM | POA: Diagnosis present

## 2015-08-08 DIAGNOSIS — I130 Hypertensive heart and chronic kidney disease with heart failure and stage 1 through stage 4 chronic kidney disease, or unspecified chronic kidney disease: Secondary | ICD-10-CM | POA: Diagnosis present

## 2015-08-08 DIAGNOSIS — N183 Chronic kidney disease, stage 3 (moderate): Secondary | ICD-10-CM | POA: Diagnosis not present

## 2015-08-08 DIAGNOSIS — S065X9A Traumatic subdural hemorrhage with loss of consciousness of unspecified duration, initial encounter: Secondary | ICD-10-CM | POA: Diagnosis present

## 2015-08-08 DIAGNOSIS — R2681 Unsteadiness on feet: Secondary | ICD-10-CM | POA: Diagnosis not present

## 2015-08-08 DIAGNOSIS — R05 Cough: Secondary | ICD-10-CM | POA: Diagnosis not present

## 2015-08-08 DIAGNOSIS — Z950 Presence of cardiac pacemaker: Secondary | ICD-10-CM | POA: Diagnosis not present

## 2015-08-08 DIAGNOSIS — Z79899 Other long term (current) drug therapy: Secondary | ICD-10-CM | POA: Diagnosis not present

## 2015-08-08 DIAGNOSIS — Z471 Aftercare following joint replacement surgery: Secondary | ICD-10-CM | POA: Diagnosis not present

## 2015-08-08 DIAGNOSIS — S0191XA Laceration without foreign body of unspecified part of head, initial encounter: Secondary | ICD-10-CM | POA: Diagnosis not present

## 2015-08-08 DIAGNOSIS — E440 Moderate protein-calorie malnutrition: Secondary | ICD-10-CM | POA: Insufficient documentation

## 2015-08-08 DIAGNOSIS — D62 Acute posthemorrhagic anemia: Secondary | ICD-10-CM | POA: Diagnosis not present

## 2015-08-08 DIAGNOSIS — D631 Anemia in chronic kidney disease: Secondary | ICD-10-CM | POA: Diagnosis present

## 2015-08-08 DIAGNOSIS — J209 Acute bronchitis, unspecified: Secondary | ICD-10-CM | POA: Diagnosis present

## 2015-08-08 DIAGNOSIS — S065X0A Traumatic subdural hemorrhage without loss of consciousness, initial encounter: Secondary | ICD-10-CM | POA: Diagnosis not present

## 2015-08-08 DIAGNOSIS — Z66 Do not resuscitate: Secondary | ICD-10-CM | POA: Diagnosis present

## 2015-08-08 DIAGNOSIS — I6200 Nontraumatic subdural hemorrhage, unspecified: Secondary | ICD-10-CM | POA: Diagnosis not present

## 2015-08-08 DIAGNOSIS — I739 Peripheral vascular disease, unspecified: Secondary | ICD-10-CM | POA: Diagnosis not present

## 2015-08-08 DIAGNOSIS — R41841 Cognitive communication deficit: Secondary | ICD-10-CM | POA: Diagnosis not present

## 2015-08-08 DIAGNOSIS — R1312 Dysphagia, oropharyngeal phase: Secondary | ICD-10-CM | POA: Diagnosis not present

## 2015-08-08 DIAGNOSIS — R296 Repeated falls: Secondary | ICD-10-CM | POA: Diagnosis present

## 2015-08-08 DIAGNOSIS — E1142 Type 2 diabetes mellitus with diabetic polyneuropathy: Secondary | ICD-10-CM | POA: Diagnosis present

## 2015-08-08 DIAGNOSIS — E1151 Type 2 diabetes mellitus with diabetic peripheral angiopathy without gangrene: Secondary | ICD-10-CM | POA: Diagnosis present

## 2015-08-08 DIAGNOSIS — Z7982 Long term (current) use of aspirin: Secondary | ICD-10-CM | POA: Diagnosis not present

## 2015-08-08 DIAGNOSIS — Z9181 History of falling: Secondary | ICD-10-CM | POA: Diagnosis not present

## 2015-08-08 DIAGNOSIS — Y92009 Unspecified place in unspecified non-institutional (private) residence as the place of occurrence of the external cause: Secondary | ICD-10-CM

## 2015-08-08 DIAGNOSIS — R627 Adult failure to thrive: Secondary | ICD-10-CM | POA: Diagnosis present

## 2015-08-08 DIAGNOSIS — N390 Urinary tract infection, site not specified: Secondary | ICD-10-CM | POA: Diagnosis present

## 2015-08-08 DIAGNOSIS — B961 Klebsiella pneumoniae [K. pneumoniae] as the cause of diseases classified elsewhere: Secondary | ICD-10-CM | POA: Diagnosis present

## 2015-08-08 DIAGNOSIS — Z87891 Personal history of nicotine dependence: Secondary | ICD-10-CM | POA: Diagnosis not present

## 2015-08-08 DIAGNOSIS — S0101XA Laceration without foreign body of scalp, initial encounter: Secondary | ICD-10-CM | POA: Diagnosis present

## 2015-08-08 LAB — GLUCOSE, CAPILLARY
GLUCOSE-CAPILLARY: 127 mg/dL — AB (ref 65–99)
GLUCOSE-CAPILLARY: 133 mg/dL — AB (ref 65–99)
GLUCOSE-CAPILLARY: 190 mg/dL — AB (ref 65–99)
Glucose-Capillary: 208 mg/dL — ABNORMAL HIGH (ref 65–99)

## 2015-08-08 LAB — CBC WITH DIFFERENTIAL/PLATELET
BASOS ABS: 0 10*3/uL (ref 0.0–0.1)
BASOS ABS: 0 10*3/uL (ref 0.0–0.1)
Basophils Relative: 0 %
Basophils Relative: 0 %
EOS PCT: 3 %
Eosinophils Absolute: 0.1 10*3/uL (ref 0.0–0.7)
Eosinophils Absolute: 0.2 10*3/uL (ref 0.0–0.7)
Eosinophils Relative: 1 %
HCT: 38.1 % — ABNORMAL LOW (ref 39.0–52.0)
HEMATOCRIT: 40.9 % (ref 39.0–52.0)
HEMOGLOBIN: 13.2 g/dL (ref 13.0–17.0)
Hemoglobin: 12.3 g/dL — ABNORMAL LOW (ref 13.0–17.0)
LYMPHS ABS: 1 10*3/uL (ref 0.7–4.0)
LYMPHS PCT: 15 %
LYMPHS PCT: 13 %
Lymphs Abs: 0.9 10*3/uL (ref 0.7–4.0)
MCH: 30.4 pg (ref 26.0–34.0)
MCH: 29.9 pg (ref 26.0–34.0)
MCHC: 32.3 g/dL (ref 30.0–36.0)
MCHC: 32.3 g/dL (ref 30.0–36.0)
MCV: 92.5 fL (ref 78.0–100.0)
MCV: 94.1 fL (ref 78.0–100.0)
Monocytes Absolute: 0.3 10*3/uL (ref 0.1–1.0)
Monocytes Absolute: 0.4 10*3/uL (ref 0.1–1.0)
Monocytes Relative: 5 %
Monocytes Relative: 6 %
NEUTROS PCT: 77 %
Neutro Abs: 4.5 10*3/uL (ref 1.7–7.7)
Neutro Abs: 6 10*3/uL (ref 1.7–7.7)
Neutrophils Relative %: 80 %
PLATELETS: 104 10*3/uL — AB (ref 150–400)
Platelets: 148 10*3/uL — ABNORMAL LOW (ref 150–400)
RBC: 4.05 MIL/uL — AB (ref 4.22–5.81)
RBC: 4.42 MIL/uL (ref 4.22–5.81)
RDW: 14.8 % (ref 11.5–15.5)
RDW: 14.9 % (ref 11.5–15.5)
WBC: 5.8 10*3/uL (ref 4.0–10.5)
WBC: 7.5 10*3/uL (ref 4.0–10.5)

## 2015-08-08 LAB — BASIC METABOLIC PANEL
Anion gap: 9 (ref 5–15)
BUN: 33 mg/dL — AB (ref 6–20)
CHLORIDE: 107 mmol/L (ref 101–111)
CO2: 24 mmol/L (ref 22–32)
CREATININE: 1.5 mg/dL — AB (ref 0.61–1.24)
Calcium: 9.3 mg/dL (ref 8.9–10.3)
GFR calc Af Amer: 47 mL/min — ABNORMAL LOW (ref >60)
GFR calc non Af Amer: 41 mL/min — ABNORMAL LOW (ref >60)
GLUCOSE: 141 mg/dL — AB (ref 65–99)
Potassium: 3.9 mmol/L (ref 3.5–5.1)
SODIUM: 140 mmol/L (ref 135–145)

## 2015-08-08 LAB — COMPREHENSIVE METABOLIC PANEL
ALT: 17 U/L (ref 17–63)
AST: 23 U/L (ref 15–41)
Albumin: 3.6 g/dL (ref 3.5–5.0)
Alkaline Phosphatase: 76 U/L (ref 38–126)
Anion gap: 7 (ref 5–15)
BUN: 29 mg/dL — AB (ref 6–20)
CHLORIDE: 106 mmol/L (ref 101–111)
CO2: 23 mmol/L (ref 22–32)
CREATININE: 1.64 mg/dL — AB (ref 0.61–1.24)
Calcium: 8.7 mg/dL — ABNORMAL LOW (ref 8.9–10.3)
GFR calc Af Amer: 42 mL/min — ABNORMAL LOW (ref >60)
GFR, EST NON AFRICAN AMERICAN: 37 mL/min — AB (ref >60)
Glucose, Bld: 204 mg/dL — ABNORMAL HIGH (ref 65–99)
Potassium: 4.2 mmol/L (ref 3.5–5.1)
SODIUM: 136 mmol/L (ref 135–145)
Total Bilirubin: 0.7 mg/dL (ref 0.3–1.2)
Total Protein: 7.2 g/dL (ref 6.5–8.1)

## 2015-08-08 LAB — TYPE AND SCREEN
ABO/RH(D): A POS
Antibody Screen: NEGATIVE

## 2015-08-08 LAB — ABO/RH: ABO/RH(D): A POS

## 2015-08-08 LAB — CBG MONITORING, ED: GLUCOSE-CAPILLARY: 174 mg/dL — AB (ref 65–99)

## 2015-08-08 SURGERY — HEMIARTHROPLASTY, HIP, DIRECT ANTERIOR APPROACH, FOR FRACTURE
Anesthesia: Choice | Laterality: Right

## 2015-08-08 MED ORDER — INSULIN ASPART 100 UNIT/ML ~~LOC~~ SOLN
0.0000 [IU] | SUBCUTANEOUS | Status: DC
  Administered 2015-08-08: 3 [IU] via SUBCUTANEOUS
  Administered 2015-08-08 – 2015-08-09 (×2): 2 [IU] via SUBCUTANEOUS
  Administered 2015-08-09: 5 [IU] via SUBCUTANEOUS
  Administered 2015-08-09: 3 [IU] via SUBCUTANEOUS
  Administered 2015-08-10: 2 [IU] via SUBCUTANEOUS
  Administered 2015-08-10: 3 [IU] via SUBCUTANEOUS
  Administered 2015-08-10: 2 [IU] via SUBCUTANEOUS
  Administered 2015-08-10: 3 [IU] via SUBCUTANEOUS
  Administered 2015-08-11: 1 [IU] via SUBCUTANEOUS
  Administered 2015-08-11: 2 [IU] via SUBCUTANEOUS
  Administered 2015-08-12: 1 [IU] via SUBCUTANEOUS
  Administered 2015-08-12 (×2): 2 [IU] via SUBCUTANEOUS
  Administered 2015-08-12: 3 [IU] via SUBCUTANEOUS
  Administered 2015-08-12: 2 [IU] via SUBCUTANEOUS
  Administered 2015-08-13: 3 [IU] via SUBCUTANEOUS
  Administered 2015-08-13: 2 [IU] via SUBCUTANEOUS
  Administered 2015-08-13 (×2): 3 [IU] via SUBCUTANEOUS
  Administered 2015-08-13 – 2015-08-14 (×2): 2 [IU] via SUBCUTANEOUS
  Administered 2015-08-14: 3 [IU] via SUBCUTANEOUS

## 2015-08-08 MED ORDER — SODIUM CHLORIDE 0.9 % IV SOLN
INTRAVENOUS | Status: DC
  Administered 2015-08-08 – 2015-08-09 (×2): via INTRAVENOUS

## 2015-08-08 MED ORDER — MEMANTINE HCL 10 MG PO TABS
10.0000 mg | ORAL_TABLET | Freq: Every day | ORAL | Status: DC
  Administered 2015-08-08 – 2015-08-13 (×6): 10 mg via ORAL
  Filled 2015-08-08 (×6): qty 1

## 2015-08-08 MED ORDER — DONEPEZIL HCL 10 MG PO TABS
10.0000 mg | ORAL_TABLET | Freq: Every day | ORAL | Status: DC
  Administered 2015-08-08 – 2015-08-13 (×6): 10 mg via ORAL
  Filled 2015-08-08 (×6): qty 1

## 2015-08-08 MED ORDER — HYDROCODONE-ACETAMINOPHEN 5-325 MG PO TABS
1.0000 | ORAL_TABLET | Freq: Four times a day (QID) | ORAL | Status: DC | PRN
  Administered 2015-08-08: 2 via ORAL
  Filled 2015-08-08: qty 2

## 2015-08-08 MED ORDER — INSULIN GLARGINE 100 UNIT/ML ~~LOC~~ SOLN
35.0000 [IU] | Freq: Every day | SUBCUTANEOUS | Status: DC
  Administered 2015-08-08 – 2015-08-13 (×6): 35 [IU] via SUBCUTANEOUS
  Filled 2015-08-08 (×7): qty 0.35

## 2015-08-08 MED ORDER — DEXTROSE 5 % IV SOLN
1.0000 g | Freq: Once | INTRAVENOUS | Status: AC
  Administered 2015-08-08: 1 g via INTRAVENOUS
  Filled 2015-08-08: qty 10

## 2015-08-08 MED ORDER — MORPHINE SULFATE (PF) 2 MG/ML IV SOLN
0.5000 mg | INTRAVENOUS | Status: DC | PRN
  Administered 2015-08-08 (×3): 0.5 mg via INTRAVENOUS
  Filled 2015-08-08 (×2): qty 1

## 2015-08-08 MED ORDER — GLUCERNA SHAKE PO LIQD
237.0000 mL | Freq: Two times a day (BID) | ORAL | Status: DC
  Administered 2015-08-09 – 2015-08-14 (×10): 237 mL via ORAL

## 2015-08-08 MED ORDER — CARVEDILOL 3.125 MG PO TABS
3.1250 mg | ORAL_TABLET | Freq: Two times a day (BID) | ORAL | Status: DC
  Administered 2015-08-08 – 2015-08-14 (×12): 3.125 mg via ORAL
  Filled 2015-08-08 (×13): qty 1

## 2015-08-08 MED ORDER — FINASTERIDE 5 MG PO TABS
5.0000 mg | ORAL_TABLET | Freq: Every day | ORAL | Status: DC
  Administered 2015-08-08 – 2015-08-14 (×7): 5 mg via ORAL
  Filled 2015-08-08 (×7): qty 1

## 2015-08-08 MED ORDER — ATORVASTATIN CALCIUM 20 MG PO TABS
20.0000 mg | ORAL_TABLET | Freq: Every day | ORAL | Status: DC
  Administered 2015-08-08 – 2015-08-14 (×7): 20 mg via ORAL
  Filled 2015-08-08 (×7): qty 1

## 2015-08-08 MED ORDER — ONDANSETRON HCL 4 MG/2ML IJ SOLN
4.0000 mg | Freq: Once | INTRAMUSCULAR | Status: AC
  Administered 2015-08-08: 4 mg via INTRAVENOUS
  Filled 2015-08-08: qty 2

## 2015-08-08 MED ORDER — MIRTAZAPINE 15 MG PO TABS
15.0000 mg | ORAL_TABLET | Freq: Every day | ORAL | Status: DC
Start: 2015-08-08 — End: 2015-08-14
  Administered 2015-08-08 – 2015-08-13 (×6): 15 mg via ORAL
  Filled 2015-08-08 (×5): qty 1

## 2015-08-08 MED ORDER — DEXTROSE 5 % IV SOLN
1.0000 g | INTRAVENOUS | Status: DC
  Administered 2015-08-08 – 2015-08-12 (×5): 1 g via INTRAVENOUS
  Filled 2015-08-08 (×6): qty 10

## 2015-08-08 MED ORDER — SERTRALINE HCL 100 MG PO TABS
100.0000 mg | ORAL_TABLET | Freq: Every day | ORAL | Status: DC
  Administered 2015-08-08 – 2015-08-13 (×6): 100 mg via ORAL
  Filled 2015-08-08 (×7): qty 1

## 2015-08-08 NOTE — Progress Notes (Signed)
Patient ID: Dakota LambHowell W White, male   DOB: October 17, 1928, 82 y.o.   MRN: [id number] Surgery for hemiarthroplasty of right hip in AM at 7:30 . Appreciate cardiology , medicine, and neurosurgical care.  NPO after MN.

## 2015-08-08 NOTE — Progress Notes (Signed)
Pharmacy Antibiotic Note  Dakota White is a 82 y.o. male admitted on 08/07/2015 with UTI.  Pharmacy has been consulted for Ceftriaxone dosing.  Plan: Ceftriaxone 1gm iv q24hr       Temp (24hrs), Avg:98.7 F (37.1 C), Min:97.5 F (36.4 C), Max:99.8 F (37.7 C)   Recent Labs Lab 08/07/15 2338  WBC 7.5  CREATININE 1.50*    CrCl cannot be calculated (Unknown ideal weight.).    Allergies  Allergen Reactions  . Actos [Pioglitazone]     "makes me feel lousy and makes me bleed"  . Ramipril     "pt not aware of allergy"    Antimicrobials this admission: Ceftriaxone 1gm iv q24hr  Dose adjustments this admission: -  Microbiology results: pending  Thank you for allowing pharmacy to be a part of this patient's care.  Aleene DavidsonGrimsley Jr, Bertina Guthridge Crowford 08/08/2015 3:55 AM

## 2015-08-08 NOTE — Progress Notes (Signed)
Dr Ophelia CharterYates in to see pt, states the Patient needs a Neuro consult before going to surgery. Patient can eat and will need to be npo after midnight. [place] [address] and informed of above.

## 2015-08-08 NOTE — Anesthesia Preprocedure Evaluation (Deleted)
Anesthesia Evaluation  Patient identified by MRN, date of birth, ID band Patient awake    Reviewed: Allergy & Precautions, NPO status , Patient's Chart, lab work & pertinent test results  History of Anesthesia Complications Negative for: history of anesthetic complications  Airway Mallampati: II  TM Distance: >3 FB Neck ROM: Full    Dental  (+) Dental Advisory Given   Pulmonary neg shortness of breath, neg COPD, neg recent URI, former smoker,    breath sounds clear to auscultation       Cardiovascular hypertension, Pt. on medications (-) angina+ Peripheral Vascular Disease and +CHF  + dysrhythmias  Rhythm:Regular     Neuro/Psych PSYCHIATRIC DISORDERS Acute on chronic subdural hematoma  Neuromuscular disease CVA    GI/Hepatic negative GI ROS, Neg liver ROS,   Endo/Other  diabetes, Type 2, Insulin Dependent  Renal/GU CRFRenal disease     Musculoskeletal Right hip fracture   Abdominal   Peds  Hematology negative hematology ROS (+)   Anesthesia Other Findings The cavity size was normal. There was mild concentric hypertrophy. Systolic function was moderately reduced. The estimated ejection fraction was in the range of 35% to 40%. Moderate hypokinesis of the mid-apicalanteroseptal and anterior myocardium. Hypokinesis of the inferior myocardium  Reproductive/Obstetrics                             Anesthesia Physical Anesthesia Plan  ASA: III  Anesthesia Plan: Spinal   Post-op Pain Management:    Induction:   Airway Management Planned: Natural Airway, Nasal Cannula and Simple Face Mask  Additional Equipment: None  Intra-op Plan:   Post-operative Plan:   Informed Consent: I have reviewed the patients History and Physical, chart, labs and discussed the procedure including the risks, benefits and alternatives for the proposed anesthesia with the patient or authorized representative who  has indicated his/her understanding and acceptance.   Dental advisory given  Plan Discussed with: CRNA and Surgeon  Anesthesia Plan Comments:         Anesthesia Quick Evaluation

## 2015-08-08 NOTE — Plan of Care (Signed)
Problem: Education: Goal: Knowledge of [facility] Education information/materials will improve Outcome: Not Met (add Reason) Patient only alert to self and does not understand.

## 2015-08-08 NOTE — H&P (Addendum)
History and Physical    Dakota White ZOX:[id number] DOB: 24-Jun-1928 DOA: 08/07/2015  PCP: Mickie HillierIan McKeag, MD  Patient coming from: Home.  History obtained from patient's son as patient has dementia.  Chief Complaint: Fall.  HPI: Dakota White is a 82 y.o. male with chronic combined systolic and diastolic CHF last EF measured was in 10/2014 35 to 40%, symptomatic bradycardia status post pacemaker placement, hypertension, chronic kidney disease, diabetes mellitus type 2, dementia was brought to the ER after patient had an unwitnessed fall at his house. Patient's son states he was walking in the hallway and suddenly fell backwards and hit his head onto a coffee table. Patient had laceration of his occipital scalp which was sutured by the ER physician. CT of the head shows subdural hematoma on the left side which is acute on chronic, as patient had a subdural hematoma during last month's fall. X-ray of the hip shows a right hip fracture and Dr. Eulah PontMurphy was consulted and also on-call neurosurgeon also was consulted and [facility]. Patient otherwise as per the patient's son did not have any chest pain shortness of breath or did not lose consciousness. UA shows features concerning for UTI.  ED Course: Scalp was sutured.  Review of Systems: As per HPI, rest all negative.   Past Medical History  Diagnosis Date  . Type II or unspecified type diabetes mellitus without mention of complication, uncontrolled   . Essential hypertension   . Mental disorder   . Dementia   . Peripheral neuropathy (HCC)   . Allergic rhinitis   . Asthma with bronchitis   . Complete heart block (HCC)     a. Requiring permanent [employer] DDD pacemaker by Dr. Ladona Ridgelaylor in 2013.  Marland Kitchen. Peripheral vascular disease, unspecified (HCC)   . Cardiac pacemaker in situ   . Mixed hyperlipidemia   . Chronic combined systolic and diastolic CHF (congestive heart failure) (HCC)     a. EF  noted to be down 10/2014 - 2D echo 10/17/14: EF 35-40%, mild LVH,  moderate HK of mid-apical anteroseptal and anterior myocardium, HK of inferior myocardium, grade 1 DD, mild MR.  . Diabetes mellitus with renal complications (HCC)   . Dementia   . BPH (benign prostatic hyperplasia)   . General weakness   . Anemia of chronic disease   . Thrombocytopenia [facility](HCC)     Past Surgical History  Procedure Laterality Date  . Rotator cuff repair    . Pacemaker insertion  Dec 2013  . Permanent pacemaker insertion N/A 01/31/2012    Procedure: PERMANENT PACEMAKER INSERTION;  Surgeon: Marinus MawGregg W Taylor, MD;  [facility];  Service: Cardiovascular;  Laterality: N/A;     reports that he has quit smoking. His smoking use included Cigarettes. He has never used smokeless tobacco. He reports that he does not drink alcohol or use illicit drugs.  Allergies  Allergen Reactions  . Actos [Pioglitazone]     "makes me feel lousy and makes me bleed"  . Ramipril     "pt not aware of allergy"    Family History  Problem Relation Age of Onset  . Heart disease Mother   . Alzheimer's disease Father   . Diabetes Sister   . COPD Brother   . Alzheimer's disease Sister     Prior to Admission medications   Medication Sig Start Date End Date Taking? Authorizing Provider  aspirin 325 MG tablet Take 325 mg  by mouth daily.   Yes Historical Provider, MD  atorvastatin (LIPITOR) 20 MG tablet Take 20 mg by mouth daily.   Yes Historical Provider, MD  carvedilol (COREG) 3.125 MG tablet TAKE 1 TABLET BY MOUTH TWICE DAILY 06/23/15  Yes Lyn Records, MD  donepezil (ARICEPT) 10 MG tablet Take 1 tablet (10 mg total) by mouth at bedtime. 02/19/13  Yes Elenora Gamma, MD  finasteride (PROSCAR) 5 MG tablet TAKE 1 TABLET BY MOUTH EVERY DAY   Yes Elenora Gamma, MD  insulin glargine (LANTUS) 100 UNIT/ML injection Inject 0.5 mLs (50 Units total) into the skin every morning. Patient taking differently: Inject 70 Units into the  skin every morning.  01/07/15  Yes Kathee Delton, MD  memantine (NAMENDA) 10 MG tablet TAKE 1 TABLET BY MOUTH EVERY NIGHT AT BEDTIME 06/23/15  Yes Kathee Delton, MD  mirtazapine (REMERON) 15 MG tablet Take 15 mg by mouth at bedtime.    Yes Historical Provider, MD  sertraline (ZOLOFT) 100 MG tablet Take 1 tablet (100 mg total) by mouth at bedtime. 02/19/13  Yes Elenora Gamma, MD  glucose blood test strip Once daily 04/16/13   Elenora Gamma, MD  [facility] DELICA LANCETS 33G MISC 1 Device by Does not apply route 4 (four) times daily as needed. 02/19/13   Elenora Gamma, MD  rosuvastatin (CRESTOR) 5 MG tablet Take 1 tablet (5 mg total) by mouth at bedtime. Patient not taking: Reported on 08/08/2015 02/19/13   Elenora Gamma, MD    Physical Exam: Filed Vitals:   08/07/15 2300 08/08/15 0044 08/08/15 0100 08/08/15 0130  BP: 149/64 178/88 179/78 190/93  Pulse: 83 88 94   Temp:      TempSrc:      Resp:  22    SpO2: 93% 94% 88%       Constitutional: Not in distress. Filed Vitals:   08/07/15 2300 08/08/15 0044 08/08/15 0100 08/08/15 0130  BP: 149/64 178/88 179/78 190/93  Pulse: 83 88 94   Temp:      TempSrc:      Resp:  22    SpO2: 93% 94% 88%    Eyes: Anicteric no pallor. ENMT: Occipital scalp has been sutured. Neck: No neck rigidity. No JVD appreciated. Respiratory: No rhonchi or crepitations. Cardiovascular: S1 and S2 heard. Abdomen: Soft nontender bowel sounds present. Musculoskeletal: No edema. Skin: Scalp sutures. Neurologic: Alert awake oriented to his name and place. Moves all extremities. Psychiatric: Patient has dementia.   Labs on Admission: I have personally reviewed following labs and imaging studies  CBC:  Recent Labs Lab 08/07/15 2338  WBC 7.5  NEUTROABS 6.0  HGB 13.2  HCT 40.9  MCV 92.5  PLT 148*   Basic Metabolic Panel:  Recent Labs Lab 08/07/15 2338  NA 140  K 3.9  CL 107  CO2 24  GLUCOSE 141*  BUN 33*  CREATININE 1.50*  CALCIUM 9.3    GFR: CrCl cannot be calculated (Unknown ideal weight.). Liver Function Tests: No results for input(s): AST, ALT, ALKPHOS, BILITOT, PROT, ALBUMIN in the last 168 hours. No results for input(s): LIPASE, AMYLASE in the last 168 hours. No results for input(s): AMMONIA in the last 168 hours. Coagulation Profile: No results for input(s): INR, PROTIME in the last 168 hours. Cardiac Enzymes: No results for input(s): CKTOTAL, CKMB, CKMBINDEX, TROPONINI in the last 168 hours. BNP (last 3 results) No results for input(s): PROBNP in the last 8760 hours. HbA1C: No results for  input(s): HGBA1C in the last 72 hours. CBG: No results for input(s): GLUCAP in the last 168 hours. Lipid Profile: No results for input(s): CHOL, HDL, LDLCALC, TRIG, CHOLHDL, LDLDIRECT in the last 72 hours. Thyroid Function Tests: No results for input(s): TSH, T4TOTAL, FREET4, T3FREE, THYROIDAB in the last 72 hours. Anemia Panel: No results for input(s): VITAMINB12, FOLATE, FERRITIN, TIBC, IRON, RETICCTPCT in the last 72 hours. Urine analysis:    Component Value Date/Time   COLORURINE YELLOW 08/07/2015 2251   APPEARANCEUR TURBID* 08/07/2015 2251   LABSPEC 1.016 08/07/2015 2251   PHURINE 6.0 08/07/2015 2251   GLUCOSEU NEGATIVE 08/07/2015 2251   HGBUR SMALL* 08/07/2015 2251   BILIRUBINUR NEGATIVE 08/07/2015 2251   BILIRUBINUR NEG 11/11/2014 1517   KETONESUR NEGATIVE 08/07/2015 2251   PROTEINUR 100* 08/07/2015 2251   PROTEINUR NEG 11/11/2014 1517   UROBILINOGEN 0.2 11/11/2014 1517   UROBILINOGEN 1.0 10/31/2014 2218   NITRITE POSITIVE* 08/07/2015 2251   NITRITE NEG 11/11/2014 1517   LEUKOCYTESUR LARGE* 08/07/2015 2251   Sepsis Labs: @LABRCNTIP (procalcitonin:4,lacticidven:4) )No results found for this or any previous visit (from the past 240 hour(s)).   Radiological Exams on Admission: Dg Chest 1 View  08/07/2015  CLINICAL DATA:  Unwitnessed fall 2 hours ago. EXAM: CHEST 1 VIEW COMPARISON:  10/31/2014 FINDINGS:  Dual lead pacemaker well positioned. Heart size is normal. Mediastinal shadows are normal. The lungs are clear. No edema, infiltrate, collapse or effusion. No acute bone finding in the chest. IMPRESSION: No active disease.  Dual lead pacemaker. Electronically Signed   By: Paulina FusiMark  Shogry M.D.   On: 08/07/2015 23:28   Ct Head Wo Contrast  08/07/2015  CLINICAL DATA:  Unwitnessed fall 2 hours ago. Found on floor. Laceration of the back of the head. EXAM: CT HEAD WITHOUT CONTRAST CT CERVICAL SPINE WITHOUT CONTRAST TECHNIQUE: Multidetector CT imaging of the head and cervical spine was performed following the standard protocol without intravenous contrast. Multiplanar CT image reconstructions of the cervical spine were also generated. COMPARISON:  06/18/2015 FINDINGS: CT HEAD FINDINGS Right parietal scalp laceration. No underlying skull fracture. No radiopaque foreign object. The brain shows generalized atrophy. Again demonstrated is a subdural hematoma along the convexity on the left which appears mixed age. There are definitely hyperdense components. Maximal thickness in the middle cranial fossa is 1 cm. Maximal thickness over the left parietal region is 6 mm. No midline shift mild chronic small-vessel ischemic changes affect the white matter. CT CERVICAL SPINE FINDINGS Alignment is normal. No fracture. There is chronic degenerative spondylosis at C5-6 and C6-7 but no significant stenosis. Ordinary osteoarthritis of the C1-2 articulation. IMPRESSION: Mixed age subdural hematoma on the left, which was present on the study of 06/18/2015. There are mixed density components, but definitely with recent hyperdense components. Maximal thickness in the middle cranial fossa region is 10 mm. Maximal thickness in the parietal region is 6 mm. No midline shift. No skull fracture. Ordinary mild degenerative changes of the cervical spine. No traumatic finding. Critical Value/emergent results were called by telephone at the time of  interpretation on 08/07/2015 at 11:45 pm to Dr. Mancel BaleELLIOTT WENTZ , who verbally acknowledged these results. Electronically Signed   By: Paulina FusiMark  Shogry M.D.   On: 08/07/2015 23:49   Ct Cervical Spine Wo Contrast  08/07/2015  CLINICAL DATA:  Unwitnessed fall 2 hours ago. Found on floor. Laceration of the back of the head. EXAM: CT HEAD WITHOUT CONTRAST CT CERVICAL SPINE WITHOUT CONTRAST TECHNIQUE: Multidetector CT imaging of the head and cervical spine  was performed following the standard protocol without intravenous contrast. Multiplanar CT image reconstructions of the cervical spine were also generated. COMPARISON:  06/18/2015 FINDINGS: CT HEAD FINDINGS Right parietal scalp laceration. No underlying skull fracture. No radiopaque foreign object. The brain shows generalized atrophy. Again demonstrated is a subdural hematoma along the convexity on the left which appears mixed age. There are definitely hyperdense components. Maximal thickness in the middle cranial fossa is 1 cm. Maximal thickness over the left parietal region is 6 mm. No midline shift mild chronic small-vessel ischemic changes affect the white matter. CT CERVICAL SPINE FINDINGS Alignment is normal. No fracture. There is chronic degenerative spondylosis at C5-6 and C6-7 but no significant stenosis. Ordinary osteoarthritis of the C1-2 articulation. IMPRESSION: Mixed age subdural hematoma on the left, which was present on the study of 06/18/2015. There are mixed density components, but definitely with recent hyperdense components. Maximal thickness in the middle cranial fossa region is 10 mm. Maximal thickness in the parietal region is 6 mm. No midline shift. No skull fracture. Ordinary mild degenerative changes of the cervical spine. No traumatic finding. Critical Value/emergent results were called by telephone at the time of interpretation on 08/07/2015 at 11:45 pm to Dr. Mancel Bale , who verbally acknowledged these results. Electronically Signed   By: Paulina Fusi M.D.   On: 08/07/2015 23:49   Dg Hip Unilat With Pelvis 2-3 Views Right  08/07/2015  CLINICAL DATA:  Unwitnessed fall 2 hours ago. Dementia. Pain and deformity. EXAM: DG HIP (WITH OR WITHOUT PELVIS) 2-3V RIGHT COMPARISON:  None. FINDINGS: Angulated femoral neck fracture on the right. No pelvic fracture. Left hip intact. IMPRESSION: Angulated femoral neck fracture on the right. Electronically Signed   By: Paulina Fusi M.D.   On: 08/07/2015 23:27     Assessment/Plan Principal Problem:   Closed right hip fracture (HCC) Active Problems:   Essential hypertension   Dementia   DM (diabetes mellitus), type 2, uncontrolled, with renal complications (HCC)   History of permanent cardiac pacemaker placement   Chronic kidney disease, stage III (moderate)   UTI (lower urinary tract infection)   Chronic combined systolic and diastolic congestive heart failure (HCC)   Anemia of chronic disease   Thrombocytopenia (HCC)   Hip fracture (HCC)    1. Right hip fracture status post mechanical fall - since patient has cardiac history I have requested cardiology consult for clearance. Patient will also need neurosurgery input regarding subdural hematoma prior to surgery. Patient will be kept nothing by mouth except medications in a.m. in anticipation of possible procedure if cleared by cardiology and neurosurgery. Did discuss with Dr. Eulah Pont, on call orthopedic surgeon. 2. Acute on chronic subdural hematoma on the left side with occipital scalp laceration status post suturing - neurosurgery to see patient in consult for further recommendations hold aspirin. 3. UTI - patient has been placed on ceftriaxone. Follow urine cultures. 4. Diabetes mellitus type 2 - since patient is going to be nothing by mouth in the morning I have decreased patient's Lantus dose from 70 units to 35 units. Once patient is postoperative change back to home dose if clinically appropriate. Patient is on sliding scale  coverage. 5. Chronic combined systolic and diastolic CHF last EF measured in September 2016 was 35-40% and history of bradycardia status post pacemaker placement - appears euvolemic not on ACE inhibitor or ARB due to renal failure. Continue Coreg. 6. Hypertension - continue Coreg. 7. Hyperlipidemia on statins. 8. Chronic kidney disease stage III - creatinine  appears to be at baseline. Follow metabolic panel. 9. Dementia - continue present medications.   DVT prophylaxis: SCDs. Code Status: DO NOT RESUSCITATE.  Family Communication: Patient's son.  Disposition Plan: Rehabilitation.  Consults called: Orthopedics. I have requested cardiology consult. ER physician did discuss with neurosurgery. Admission status: Inpatient. Telemetry. Likely stay 3-4 days.    Eduard Clos MD [facility] Pager [phone].  If 7PM-7AM, please contact night-coverage [email or web address] Password [email or web address]  08/08/2015, 2:03 AM

## 2015-08-08 NOTE — ED Notes (Signed)
PA at bedside.

## 2015-08-08 NOTE — Care Management Important Message (Signed)
Important Message  Patient Details  Name: Dakota LambHowell W Schraeder MRN: [id number] Date of Birth: 01-31-1929   Medicare Important Message Given:  Yes    Trenice Mesa, Stephan MinisterSusan Coleman 08/08/2015, 10:35 AM

## 2015-08-08 NOTE — ED Notes (Addendum)
Pt.'s son Brett CanalesSteve notified on pt.'s transfer.

## 2015-08-08 NOTE — Progress Notes (Signed)
I spoke with Dr. Yetta BarreJones who said nothing to do for SDH, just needs monitoring, follow up in next week or so but nothing to do as far as surgery.  [name]

## 2015-08-08 NOTE — Progress Notes (Signed)
Initial Nutrition Assessment  DOCUMENTATION CODES:   Non-severe (moderate) malnutrition in context of chronic illness  INTERVENTION:  Once diet advances, provide Glucerna Shake po BID, each supplement provides 220 kcal and 10 grams of protein.  NUTRITION DIAGNOSIS:   Malnutrition related to chronic illness as evidenced by moderate depletion of body fat, moderate depletions of muscle mass.  GOAL:   Patient will meet greater than or equal to 90% of their needs  MONITOR:   Supplement acceptance, Weight trends, Labs, I & O's, Diet advancement, Skin  REASON FOR ASSESSMENT:   Consult Hip fracture protocol  ASSESSMENT:   82 y.o. male with chronic combined systolic and diastolic CHF last EF in 10/2014 35 to 40%, symptomatic bradycardia status post pacemaker placement, hypertension, chronic kidney disease, diabetes mellitus type 2, dementia who [facility] status post unwitnessed fall at home.  CT head showed subdural hematoma on the left side which is acute on chronic S patient had subdural hematoma last month after the fall. Patient was also found to have a right hip fracture.  Pt is currently NPO for surgery today. Son at bedside. He reports pt usually eats well PTA with consumption of at least 3 meals a day with no other difficulties. Noted, no new weight recorded, thus pt was weighed on bed scale that revealed ~166 lbs. Son reports pt's weight has been stable. RD to order Glucerna Shake to aid in caloric and protein needs. Nursing staff to provide once diet advances.   Nutrition-Focused physical exam completed. Findings are moderate fat depletion, moderate muscle depletion, and no edema.   Labs and medications reviewed. CBGs 127-208 mg/dL.  Diet Order:  Diet NPO time specified Diet NPO time specified  Skin:  Wound (see comment) (laceration on head)  Last BM:  7/7  Height:   Ht Readings from Last 1 Encounters:  12/20/14 5\' 10"  (1.778 m)    Weight:    Wt Readings from Last 1 Encounters:  12/20/14 168 lb 6.4 oz (76.386 kg)    Ideal Body Weight:  75.45 kg  BMI:  There is no weight on file to calculate BMI.  Estimated Nutritional Needs:   Kcal:  1850-2050  Protein:  85-95 grams  Fluid:  1.8 - 2 L/day  EDUCATION NEEDS:   No education needs identified at this time  Roslyn SmilingStephanie Faithann Natal, MS, RD, LDN Pager # [phone] After hours/ weekend pager # [phone]

## 2015-08-08 NOTE — Consult Note (Signed)
Reason for Consult:   Pre op clearance-hip  Requesting Physician: [facility] Cardiologist Dr Katrinka Blazing  HPI:   82 y.o. male with history of chronic combined CHF with an EF of 35-40% by echo in Sept 2016, DDD s/p [facility] pacemaker 2013, DM, HTN, CKD III, and moderate dementia. He has had prior  admission for UTIs. He suffered a small SDH in May 2017 after a fall. He is admitted now after a fall at home. The pt suffered a Rt hip fracture and new SDH. We are asked to see for cardiac clearance prior to hip surgery. The pt denies any history of CAD, chest pain, or CHF symptoms and the pt's son confirms this. He is unsteady on his feet, uses a walker at home.   PMHx:  Past Medical History  Diagnosis Date  . Type II or unspecified type diabetes mellitus without mention of complication, uncontrolled   . Essential hypertension   . Mental disorder   . Dementia   . Peripheral neuropathy (HCC)   . Allergic rhinitis   . Asthma with bronchitis   . Complete heart block (HCC)     a. Requiring permanent [employer] DDD pacemaker by Dr. Ladona Ridgel in 2013.  Marland Kitchen Peripheral vascular disease, unspecified (HCC)   . Cardiac pacemaker in situ   . Mixed hyperlipidemia   . Chronic combined systolic and diastolic CHF (congestive heart failure) (HCC)     a. EF noted to be down 10/2014 - 2D echo 10/17/14: EF 35-40%, mild LVH,  moderate HK of mid-apical anteroseptal and anterior myocardium, HK of inferior myocardium, grade 1 DD, mild MR.  . Diabetes mellitus with renal complications (HCC)   . Dementia   . BPH (benign prostatic hyperplasia)   . General weakness   . Anemia of chronic disease   . Thrombocytopenia [facility])     Past Surgical History  Procedure Laterality Date  . Rotator cuff repair    . Pacemaker insertion  Dec 2013  . Permanent pacemaker insertion N/A 01/31/2012    Procedure: PERMANENT PACEMAKER INSERTION;  Surgeon: Marinus Maw, MD;  [facility] CATH LAB;  Service: Cardiovascular;   Laterality: N/A;    SOCHx:  reports that he has quit smoking. His smoking use included Cigarettes. He has never used smokeless tobacco. He reports that he does not drink alcohol or use illicit drugs.  FAMHx: Family History  Problem Relation Age of Onset  . Heart disease Mother   . Alzheimer's disease Father   . Diabetes Sister   . COPD Brother   . Alzheimer's disease Sister     ALLERGIES: Allergies  Allergen Reactions  . Actos [Pioglitazone]     "makes me feel lousy and makes me bleed"  . Ramipril     "pt not aware of allergy"    ROS: Review of Systems: Not obtained-pt has dementia   HOME MEDICATIONS: Prior to Admission medications   Medication Sig Start Date End Date Taking? Authorizing Provider  aspirin 325 MG tablet Take 325 mg by mouth daily.   Yes Historical Provider, MD  atorvastatin (LIPITOR) 20 MG tablet Take 20 mg by mouth daily.   Yes Historical Provider, MD  carvedilol (COREG) 3.125 MG tablet TAKE 1 TABLET BY MOUTH TWICE DAILY 06/23/15  Yes Lyn Records, MD  donepezil (ARICEPT) 10 MG tablet Take 1 tablet (10 mg total) by mouth at bedtime. 02/19/13  Yes Elenora Gamma, MD  finasteride (PROSCAR) 5 MG  tablet TAKE 1 TABLET BY MOUTH EVERY DAY   Yes Elenora Gamma, MD  insulin glargine (LANTUS) 100 UNIT/ML injection Inject 0.5 mLs (50 Units total) into the skin every morning. Patient taking differently: Inject 70 Units into the skin every morning.  01/07/15  Yes Kathee Delton, MD  memantine (NAMENDA) 10 MG tablet TAKE 1 TABLET BY MOUTH EVERY NIGHT AT BEDTIME 06/23/15  Yes Kathee Delton, MD  mirtazapine (REMERON) 15 MG tablet Take 15 mg by mouth at bedtime.    Yes Historical Provider, MD  sertraline (ZOLOFT) 100 MG tablet Take 1 tablet (100 mg total) by mouth at bedtime. 02/19/13  Yes Elenora Gamma, MD  glucose blood test strip Once daily 04/16/13   Elenora Gamma, MD  [facility] DELICA LANCETS 33G MISC 1 Device by Does not apply route 4 (four) times daily as  needed. 02/19/13   Elenora Gamma, MD  rosuvastatin (CRESTOR) 5 MG tablet Take 1 tablet (5 mg total) by mouth at bedtime. Patient not taking: Reported on 08/08/2015 02/19/13   Elenora Gamma, MD    HOSPITAL MEDICATIONS: I have reviewed the patient's current medications.  VITALS: Blood pressure 139/55, pulse 87, temperature 99.8 F (37.7 C), temperature source Oral, resp. rate 20, SpO2 96 %.  PHYSICAL EXAM: General appearance: alert, cooperative and no distress Neck: no carotid bruit and no JVD Lungs: clear to auscultation bilaterally Heart: regular rate and rhythm Abdomen: soft, non-tender; bowel sounds normal; no masses,  no organomegaly Extremities: no edema -RLE shortened and rotated Pulses: 2+ and symmetric Skin: Skin color, texture, turgor normal. No rashes or lesions Neurologic: Grossly normal  LABS: Results for orders placed or performed during the hospital encounter of 08/07/15 (from the past 24 hour(s))  Urinalysis, Routine w reflex microscopic     Status: Abnormal   Collection Time: 08/07/15 10:51 PM  Result Value Ref Range   Color, Urine YELLOW YELLOW   APPearance TURBID (A) CLEAR   Specific Gravity, Urine 1.016 1.005 - 1.030   pH 6.0 5.0 - 8.0   Glucose, UA NEGATIVE NEGATIVE mg/dL   Hgb urine dipstick SMALL (A) NEGATIVE   Bilirubin Urine NEGATIVE NEGATIVE   Ketones, ur NEGATIVE NEGATIVE mg/dL   Protein, ur 213 (A) NEGATIVE mg/dL   Nitrite POSITIVE (A) NEGATIVE   Leukocytes, UA LARGE (A) NEGATIVE  Urine microscopic-add on     Status: Abnormal   Collection Time: 08/07/15 10:51 PM  Result Value Ref Range   Squamous Epithelial / LPF 0-5 (A) NONE SEEN   WBC, UA TOO NUMEROUS TO COUNT 0 - 5 WBC/hpf   RBC / HPF 0-5 0 - 5 RBC/hpf   Bacteria, UA MANY (A) NONE SEEN  Basic metabolic panel     Status: Abnormal   Collection Time: 08/07/15 11:38 PM  Result Value Ref Range   Sodium 140 135 - 145 mmol/L   Potassium 3.9 3.5 - 5.1 mmol/L   Chloride 107 101 - 111 mmol/L     CO2 24 22 - 32 mmol/L   Glucose, Bld 141 (H) 65 - 99 mg/dL   BUN 33 (H) 6 - 20 mg/dL   Creatinine, Ser 0.86 (H) 0.61 - 1.24 mg/dL   Calcium 9.3 8.9 - 57.8 mg/dL   GFR calc non Af Amer 41 (L) >60 mL/min   GFR calc Af Amer 47 (L) >60 mL/min   Anion gap 9 5 - 15  CBC with Differential     Status: Abnormal   Collection Time:  08/07/15 11:38 PM  Result Value Ref Range   WBC 7.5 4.0 - 10.5 K/uL   RBC 4.42 4.22 - 5.81 MIL/uL   Hemoglobin 13.2 13.0 - 17.0 g/dL   HCT 40.940.9 81.139.0 - 91.452.0 %   MCV 92.5 78.0 - 100.0 fL   MCH 29.9 26.0 - 34.0 pg   MCHC 32.3 30.0 - 36.0 g/dL   RDW 78.214.8 95.611.5 - 21.315.5 %   Platelets 148 (L) 150 - 400 K/uL   Neutrophils Relative % 80 %   Neutro Abs 6.0 1.7 - 7.7 K/uL   Lymphocytes Relative 13 %   Lymphs Abs 1.0 0.7 - 4.0 K/uL   Monocytes Relative 6 %   Monocytes Absolute 0.4 0.1 - 1.0 K/uL   Eosinophils Relative 1 %   Eosinophils Absolute 0.1 0.0 - 0.7 K/uL   Basophils Relative 0 %   Basophils Absolute 0.0 0.0 - 0.1 K/uL  CBG monitoring, ED     Status: Abnormal   Collection Time: 08/08/15  3:16 AM  Result Value Ref Range   Glucose-Capillary 174 (H) 65 - 99 mg/dL  Glucose, capillary     Status: Abnormal   Collection Time: 08/08/15  3:58 AM  Result Value Ref Range   Glucose-Capillary 208 (H) 65 - 99 mg/dL   Comment 1 Notify RN   Type and screen [facility]     Status: None   Collection Time: 08/08/15  4:55 AM  Result Value Ref Range   ABO/RH(D) A POS    Antibody Screen NEG    Sample Expiration 08/11/2015   ABO/Rh     Status: None (Preliminary result)   Collection Time: 08/08/15  4:55 AM  Result Value Ref Range   ABO/RH(D) A POS     EKG: Paced  IMAGING: Dg Chest 1 View  08/07/2015  CLINICAL DATA:  Unwitnessed fall 2 hours ago. EXAM: CHEST 1 VIEW COMPARISON:  10/31/2014 FINDINGS: Dual lead pacemaker well positioned. Heart size is normal. Mediastinal shadows are normal. The lungs are clear. No edema, infiltrate, collapse or effusion. No  acute bone finding in the chest. IMPRESSION: No active disease.  Dual lead pacemaker. Electronically Signed   By: Paulina FusiMark  Shogry M.D.   On: 08/07/2015 23:28   Ct Head Wo Contrast  08/07/2015  CLINICAL DATA:  Unwitnessed fall 2 hours ago. Found on floor. Laceration of the back of the head. EXAM: CT HEAD WITHOUT CONTRAST CT CERVICAL SPINE WITHOUT CONTRAST TECHNIQUE: Multidetector CT imaging of the head and cervical spine was performed following the standard protocol without intravenous contrast. Multiplanar CT image reconstructions of the cervical spine were also generated. COMPARISON:  06/18/2015 FINDINGS: CT HEAD FINDINGS Right parietal scalp laceration. No underlying skull fracture. No radiopaque foreign object. The brain shows generalized atrophy. Again demonstrated is a subdural hematoma along the convexity on the left which appears mixed age. There are definitely hyperdense components. Maximal thickness in the middle cranial fossa is 1 cm. Maximal thickness over the left parietal region is 6 mm. No midline shift mild chronic small-vessel ischemic changes affect the white matter. CT CERVICAL SPINE FINDINGS Alignment is normal. No fracture. There is chronic degenerative spondylosis at C5-6 and C6-7 but no significant stenosis. Ordinary osteoarthritis of the C1-2 articulation. IMPRESSION: Mixed age subdural hematoma on the left, which was present on the study of 06/18/2015. There are mixed density components, but definitely with recent hyperdense components. Maximal thickness in the middle cranial fossa region is 10 mm. Maximal thickness in the parietal region  is 6 mm. No midline shift. No skull fracture. Ordinary mild degenerative changes of the cervical spine. No traumatic finding. Critical Value/emergent results were called by telephone at the time of interpretation on 08/07/2015 at 11:45 pm to Dr. Mancel Bale , who verbally acknowledged these results. Electronically Signed   By: Paulina Fusi M.D.   On:  08/07/2015 23:49   Ct Cervical Spine Wo Contrast  08/07/2015  CLINICAL DATA:  Unwitnessed fall 2 hours ago. Found on floor. Laceration of the back of the head. EXAM: CT HEAD WITHOUT CONTRAST CT CERVICAL SPINE WITHOUT CONTRAST TECHNIQUE: Multidetector CT imaging of the head and cervical spine was performed following the standard protocol without intravenous contrast. Multiplanar CT image reconstructions of the cervical spine were also generated. COMPARISON:  06/18/2015 FINDINGS: CT HEAD FINDINGS Right parietal scalp laceration. No underlying skull fracture. No radiopaque foreign object. The brain shows generalized atrophy. Again demonstrated is a subdural hematoma along the convexity on the left which appears mixed age. There are definitely hyperdense components. Maximal thickness in the middle cranial fossa is 1 cm. Maximal thickness over the left parietal region is 6 mm. No midline shift mild chronic small-vessel ischemic changes affect the white matter. CT CERVICAL SPINE FINDINGS Alignment is normal. No fracture. There is chronic degenerative spondylosis at C5-6 and C6-7 but no significant stenosis. Ordinary osteoarthritis of the C1-2 articulation. IMPRESSION: Mixed age subdural hematoma on the left, which was present on the study of 06/18/2015. There are mixed density components, but definitely with recent hyperdense components. Maximal thickness in the middle cranial fossa region is 10 mm. Maximal thickness in the parietal region is 6 mm. No midline shift. No skull fracture. Ordinary mild degenerative changes of the cervical spine. No traumatic finding. Critical Value/emergent results were called by telephone at the time of interpretation on 08/07/2015 at 11:45 pm to Dr. Mancel Bale , who verbally acknowledged these results. Electronically Signed   By: Paulina Fusi M.D.   On: 08/07/2015 23:49   Dg Hip Unilat With Pelvis 2-3 Views Right  08/07/2015  CLINICAL DATA:  Unwitnessed fall 2 hours ago. Dementia. Pain  and deformity. EXAM: DG HIP (WITH OR WITHOUT PELVIS) 2-3V RIGHT COMPARISON:  None. FINDINGS: Angulated femoral neck fracture on the right. No pelvic fracture. Left hip intact. IMPRESSION: Angulated femoral neck fracture on the right. Electronically Signed   By: Paulina Fusi M.D.   On: 08/07/2015 23:27    IMPRESSION: Principal Problem:   Closed right hip fracture (HCC) Active Problems:   Subdural hematoma (HCC)   Fall at home-(recurrent falls)   Essential hypertension   Dementia   DM (diabetes mellitus), type 2, uncontrolled, with renal complications (HCC)   Chronic kidney disease, stage III (moderate)   UTI (lower urinary tract infection)   Chronic combined systolic and diastolic congestive heart failure (HCC)   Pre-operative cardiovascular examination   History of permanent cardiac pacemaker placement   Anemia of chronic disease   Thrombocytopenia (HCC)   RECOMMENDATION: Pt is an acceptable risk from a cardiovascular standpoint for hip surgery. We will be available peri-op for any cardiac issues. He probably doesn't need telemetry post op as he has a pacemaker.   Time Spent Directly with Patient: 40 minutes  Corine Shelter, [place]  [phone] beeper 08/08/2015, 8:19 AM   Agree with note written by Corine Shelter [facility]  Pt of Dr Michaelle Copas s/p PTVPM, LV dysfunction stable w/o CHF Sx. Has Fx hip and SDH from fall. Pt has dementia as well. OK to  proceed with ORIF at acceptable CV peri-op risk. Will follow post op.  Nanetta BattyBerry, Kyrel Leighton 08/08/2015 9:40 AM

## 2015-08-08 NOTE — Consult Note (Signed)
Reason for Consult:displaced right femoral neck fracture Referring Physician: Charlies Silvers MD  Dakota White is an 82 y.o. male.  HPI: 82 yo with previous fall last month with SDH had new unwitness fall with right displaced femoral neck fracture and mixed density suggesting some new component to SDH.  Neurosurgical eval pending.  Cardiology has seen and cleared pt.  Dementia , confusion, diabetes, CHF among medical problems with EF 40-45%.   Past Medical History  Diagnosis Date  . Type II or unspecified type diabetes mellitus without mention of complication, uncontrolled   . Essential hypertension   . Mental disorder   . Dementia   . Peripheral neuropathy ([place])   . Allergic rhinitis   . Asthma with bronchitis   . Complete heart block ([place])     a. Requiring permanent St. Jude DDD pacemaker by Dr. Lovena Le in 2013.  Marland Kitchen Peripheral vascular disease, unspecified ([place])   . Cardiac pacemaker in situ   . Mixed hyperlipidemia   . Chronic combined systolic and diastolic CHF (congestive heart failure) ([place])     a. EF noted to be down 10/2014 - 2D echo 10/17/14: EF 35-40%, mild LVH,  moderate HK of mid-apical anteroseptal and anterior myocardium, HK of inferior myocardium, grade 1 DD, mild MR.  . Diabetes mellitus with renal complications ([place])   . Dementia   . BPH (benign prostatic hyperplasia)   . General weakness   . Anemia of chronic disease   . Thrombocytopenia [facility])     Past Surgical History  Procedure Laterality Date  . Rotator cuff repair    . Pacemaker insertion  Dec 2013  . Permanent pacemaker insertion N/A 01/31/2012    Procedure: PERMANENT PACEMAKER INSERTION;  Surgeon: Evans Lance, MD;  Location: [facility] CATH LAB;  Service: Cardiovascular;  Laterality: N/A;    Family History  Problem Relation Age of Onset  . Heart disease Mother   . Alzheimer's disease Father   . Diabetes Sister   . COPD Brother   . Alzheimer's disease Sister     Social History:  reports that he has quit smoking.  His smoking use included Cigarettes. He has never used smokeless tobacco. He reports that he does not drink alcohol or use illicit drugs.  Allergies:  Allergies  Allergen Reactions  . Actos [Pioglitazone]     "makes me feel lousy and makes me bleed"  . Ramipril     "pt not aware of allergy"    Medications: I have reviewed the patient's current medications.  Results for orders placed or performed during the hospital encounter of 08/07/15 (from the past 48 hour(s))  Urinalysis, Routine w reflex microscopic     Status: Abnormal   Collection Time: 08/07/15 10:51 PM  Result Value Ref Range   Color, Urine YELLOW YELLOW   APPearance TURBID (A) CLEAR   Specific Gravity, Urine 1.016 1.005 - 1.030   pH 6.0 5.0 - 8.0   Glucose, UA NEGATIVE NEGATIVE mg/dL   Hgb urine dipstick SMALL (A) NEGATIVE   Bilirubin Urine NEGATIVE NEGATIVE   Ketones, ur NEGATIVE NEGATIVE mg/dL   Protein, ur 100 (A) NEGATIVE mg/dL   Nitrite POSITIVE (A) NEGATIVE   Leukocytes, UA LARGE (A) NEGATIVE  Urine microscopic-add on     Status: Abnormal   Collection Time: 08/07/15 10:51 PM  Result Value Ref Range   Squamous Epithelial / LPF 0-5 (A) NONE SEEN   WBC, UA TOO NUMEROUS TO COUNT 0 - 5 WBC/hpf   RBC / HPF 0-5  0 - 5 RBC/hpf   Bacteria, UA MANY (A) NONE SEEN  Basic metabolic panel     Status: Abnormal   Collection Time: 08/07/15 11:38 PM  Result Value Ref Range   Sodium 140 135 - 145 mmol/L   Potassium 3.9 3.5 - 5.1 mmol/L   Chloride 107 101 - 111 mmol/L   CO2 24 22 - 32 mmol/L   Glucose, Bld 141 (H) 65 - 99 mg/dL   BUN 33 (H) 6 - 20 mg/dL   Creatinine, Ser 1.50 (H) 0.61 - 1.24 mg/dL   Calcium 9.3 8.9 - 10.3 mg/dL   GFR calc non Af Amer 41 (L) >60 mL/min   GFR calc Af Amer 47 (L) >60 mL/min    Comment: (NOTE) The eGFR has been calculated using the CKD EPI equation. This calculation has not been validated in all clinical situations. eGFR's persistently <60 mL/min signify possible Chronic Kidney Disease.     Anion gap 9 5 - 15  CBC with Differential     Status: Abnormal   Collection Time: 08/07/15 11:38 PM  Result Value Ref Range   WBC 7.5 4.0 - 10.5 K/uL   RBC 4.42 4.22 - 5.81 MIL/uL   Hemoglobin 13.2 13.0 - 17.0 g/dL   HCT 40.9 39.0 - 52.0 %   MCV 92.5 78.0 - 100.0 fL   MCH 29.9 26.0 - 34.0 pg   MCHC 32.3 30.0 - 36.0 g/dL   RDW 14.8 11.5 - 15.5 %   Platelets 148 (L) 150 - 400 K/uL   Neutrophils Relative % 80 %   Neutro Abs 6.0 1.7 - 7.7 K/uL   Lymphocytes Relative 13 %   Lymphs Abs 1.0 0.7 - 4.0 K/uL   Monocytes Relative 6 %   Monocytes Absolute 0.4 0.1 - 1.0 K/uL   Eosinophils Relative 1 %   Eosinophils Absolute 0.1 0.0 - 0.7 K/uL   Basophils Relative 0 %   Basophils Absolute 0.0 0.0 - 0.1 K/uL  CBG monitoring, ED     Status: Abnormal   Collection Time: 08/08/15  3:16 AM  Result Value Ref Range   Glucose-Capillary 174 (H) 65 - 99 mg/dL  Glucose, capillary     Status: Abnormal   Collection Time: 08/08/15  3:58 AM  Result Value Ref Range   Glucose-Capillary 208 (H) 65 - 99 mg/dL   Comment 1 Notify RN   Type and screen [place]     Status: None   Collection Time: 08/08/15  4:55 AM  Result Value Ref Range   ABO/RH(D) A POS    Antibody Screen NEG    Sample Expiration 08/11/2015   ABO/Rh     Status: None   Collection Time: 08/08/15  4:55 AM  Result Value Ref Range   ABO/RH(D) A POS     Dg Chest 1 View  08/07/2015  CLINICAL DATA:  Unwitnessed fall 2 hours ago. EXAM: CHEST 1 VIEW COMPARISON:  10/31/2014 FINDINGS: Dual lead pacemaker well positioned. Heart size is normal. Mediastinal shadows are normal. The lungs are clear. No edema, infiltrate, collapse or effusion. No acute bone finding in the chest. IMPRESSION: No active disease.  Dual lead pacemaker. Electronically Signed   By: Nelson Chimes M.D.   On: 08/07/2015 23:28   Ct Head Wo Contrast  08/07/2015  CLINICAL DATA:  Unwitnessed fall 2 hours ago. Found on floor. Laceration of the back of the head. EXAM:  CT HEAD WITHOUT CONTRAST CT CERVICAL SPINE WITHOUT CONTRAST TECHNIQUE: Multidetector CT  imaging of the head and cervical spine was performed following the standard protocol without intravenous contrast. Multiplanar CT image reconstructions of the cervical spine were also generated. COMPARISON:  06/18/2015 FINDINGS: CT HEAD FINDINGS Right parietal scalp laceration. No underlying skull fracture. No radiopaque foreign object. The brain shows generalized atrophy. Again demonstrated is a subdural hematoma along the convexity on the left which appears mixed age. There are definitely hyperdense components. Maximal thickness in the middle cranial fossa is 1 cm. Maximal thickness over the left parietal region is 6 mm. No midline shift mild chronic small-vessel ischemic changes affect the white matter. CT CERVICAL SPINE FINDINGS Alignment is normal. No fracture. There is chronic degenerative spondylosis at C5-6 and C6-7 but no significant stenosis. Ordinary osteoarthritis of the C1-2 articulation. IMPRESSION: Mixed age subdural hematoma on the left, which was present on the study of 06/18/2015. There are mixed density components, but definitely with recent hyperdense components. Maximal thickness in the middle cranial fossa region is 10 mm. Maximal thickness in the parietal region is 6 mm. No midline shift. No skull fracture. Ordinary mild degenerative changes of the cervical spine. No traumatic finding. Critical Value/emergent results were called by telephone at the time of interpretation on 08/07/2015 at 11:45 pm to Dr. Daleen Bo , who verbally acknowledged these results. Electronically Signed   By: Nelson Chimes M.D.   On: 08/07/2015 23:49   Ct Cervical Spine Wo Contrast  08/07/2015  CLINICAL DATA:  Unwitnessed fall 2 hours ago. Found on floor. Laceration of the back of the head. EXAM: CT HEAD WITHOUT CONTRAST CT CERVICAL SPINE WITHOUT CONTRAST TECHNIQUE: Multidetector CT imaging of the head and cervical spine was  performed following the standard protocol without intravenous contrast. Multiplanar CT image reconstructions of the cervical spine were also generated. COMPARISON:  06/18/2015 FINDINGS: CT HEAD FINDINGS Right parietal scalp laceration. No underlying skull fracture. No radiopaque foreign object. The brain shows generalized atrophy. Again demonstrated is a subdural hematoma along the convexity on the left which appears mixed age. There are definitely hyperdense components. Maximal thickness in the middle cranial fossa is 1 cm. Maximal thickness over the left parietal region is 6 mm. No midline shift mild chronic small-vessel ischemic changes affect the white matter. CT CERVICAL SPINE FINDINGS Alignment is normal. No fracture. There is chronic degenerative spondylosis at C5-6 and C6-7 but no significant stenosis. Ordinary osteoarthritis of the C1-2 articulation. IMPRESSION: Mixed age subdural hematoma on the left, which was present on the study of 06/18/2015. There are mixed density components, but definitely with recent hyperdense components. Maximal thickness in the middle cranial fossa region is 10 mm. Maximal thickness in the parietal region is 6 mm. No midline shift. No skull fracture. Ordinary mild degenerative changes of the cervical spine. No traumatic finding. Critical Value/emergent results were called by telephone at the time of interpretation on 08/07/2015 at 11:45 pm to Dr. Daleen Bo , who verbally acknowledged these results. Electronically Signed   By: Nelson Chimes M.D.   On: 08/07/2015 23:49   Dg Hip Unilat With Pelvis 2-3 Views Right  08/07/2015  CLINICAL DATA:  Unwitnessed fall 2 hours ago. Dementia. Pain and deformity. EXAM: DG HIP (WITH OR WITHOUT PELVIS) 2-3V RIGHT COMPARISON:  None. FINDINGS: Angulated femoral neck fracture on the right. No pelvic fracture. Left hip intact. IMPRESSION: Angulated femoral neck fracture on the right. Electronically Signed   By: Nelson Chimes M.D.   On: 08/07/2015  23:27    Review of Systems  Constitutional: Negative for  fever and chills.  Eyes: Negative for blurred vision.  Cardiovascular: Negative for chest pain.       Pacemaker  Gastrointestinal: Negative for abdominal pain.  Musculoskeletal: Positive for falls.  Neurological: Positive for sensory change and focal weakness.  Psychiatric/Behavioral: Positive for memory loss.   Blood pressure 139/55, pulse 87, temperature 99.8 F (37.7 C), temperature source Oral, resp. rate 20, SpO2 96 %. Physical Exam  HENT:  Head: Normocephalic.  Eyes: Pupils are equal, round, and reactive to light.  Neck: No thyromegaly present.  Cardiovascular: Normal rate.   Respiratory: Effort normal and breath sounds normal.  GI: Soft.  Genitourinary:  Penile inplant  Musculoskeletal:  Right LE short and ER. Pulses intact to foot.   Skin: Skin is warm and dry. No rash noted. No erythema.  Psychiatric:  Some confusion    Assessment/Plan:     Right diplaced femoral neck fracture. Will proceed with hemiarthroplasty in AM if Neurosurgery clears patient. Will hold on DVT proph. Due to SDH.   YATES,MARK C 08/08/2015, 11:28 AM

## 2015-08-08 NOTE — Progress Notes (Addendum)
Patient ID: Verita LambHowell W Markgraf, male   DOB: 1928/10/14, 82 y.o.   MRN: [id number] Patient admitted after midnight. Please refer to admission note done 08/08/2015 for further details.  82 y.o. male with chronic combined systolic and diastolic CHF last EF in 10/2014 35 to 40%, symptomatic bradycardia status post pacemaker placement, hypertension, chronic kidney disease, diabetes mellitus type 2, dementia who [facility] post unwitnessed fall at home.   On admission, he was hemodynamically stable. CT head showed subdural hematoma on the left side which is acute on chronic S patient had subdural hematoma last month after the fall. Patient was also found to have a right hip fracture. Ortho saw the pt in consultation. Cardiology consult requested for preoperative clearance. Neurosurgery also consulted for input on subdural hematoma.  Assessment and plan:  Fall and subsequent right femoral neck hip fracture - Appreciate orthopedic surgery recommendations - Awaiting input from cardiology, neurosurgery prior to orthopedic surgery taking place - Continue pain management efforts  Subdural hematoma - Neurology consulted - Addendum - continue to observe, not big enough for surgery; needs however follow up scans over time (f/u with dr. Robyne Askewnuddleman of neurosx)  Manson PasseyAlma Devine Independent [facility] Center[facility] [phone]

## 2015-08-08 NOTE — Progress Notes (Signed)
Patient ID: Dakota White, male   DOB: 03/11/1928, 82 y.o.   MRN: [id number] Will see later today. Once cleared will proceed with Hemiarthroplasty.  ( ? Saturday AM ).  My cell [phone]

## 2015-08-08 NOTE — Consult Note (Signed)
Reason for Consult:chi Referring Physician: er  Dakota White is an 82 y.o. male.  HPI: patint brought to the e r the second time in two months after he fell. Has a history of dementia. Clinically he had a laceration right parietal with a fracture of the femoral neck on the right side and a mixed age subdural hematoma  Past Medical History  Diagnosis Date  . Type II or unspecified type diabetes mellitus without mention of complication, uncontrolled   . Essential hypertension   . Mental disorder   . Dementia   . Peripheral neuropathy ([place])   . Allergic rhinitis   . Asthma with bronchitis   . Complete heart block ([place])     a. Requiring permanent St. Jude DDD pacemaker by Dr. Lovena Le in 2013.  Marland Kitchen Peripheral vascular disease, unspecified ([place])   . Cardiac pacemaker in situ   . Mixed hyperlipidemia   . Chronic combined systolic and diastolic CHF (congestive heart failure) ([place])     a. EF noted to be down 10/2014 - 2D echo 10/17/14: EF 35-40%, mild LVH,  moderate HK of mid-apical anteroseptal and anterior myocardium, HK of inferior myocardium, grade 1 DD, mild MR.  . Diabetes mellitus with renal complications ([place])   . Dementia   . BPH (benign prostatic hyperplasia)   . General weakness   . Anemia of chronic disease   . Thrombocytopenia [facility])     Past Surgical History  Procedure Laterality Date  . Rotator cuff repair    . Pacemaker insertion  Dec 2013  . Permanent pacemaker insertion N/A 01/31/2012    Procedure: PERMANENT PACEMAKER INSERTION;  Surgeon: Evans Lance, MD;  Location: [facility] CATH LAB;  Service: Cardiovascular;  Laterality: N/A;    Family History  Problem Relation Age of Onset  . Heart disease Mother   . Alzheimer's disease Father   . Diabetes Sister   . COPD Brother   . Alzheimer's disease Sister     Social History:  reports that he has quit smoking. His smoking use included Cigarettes. He has never used smokeless tobacco. He reports that he does not drink alcohol or  use illicit drugs.  Allergies:  Allergies  Allergen Reactions  . Actos [Pioglitazone] Other (See Comments)    "makes me feel lousy and makes me bleed"  . Ramipril Other (See Comments)    "pt not aware of allergy"    Medications:see hp  Results for orders placed or performed during the hospital encounter of 08/07/15 (from the past 48 hour(s))  Urinalysis, Routine w reflex microscopic     Status: Abnormal   Collection Time: 08/07/15 10:51 PM  Result Value Ref Range   Color, Urine YELLOW YELLOW   APPearance TURBID (A) CLEAR   Specific Gravity, Urine 1.016 1.005 - 1.030   pH 6.0 5.0 - 8.0   Glucose, UA NEGATIVE NEGATIVE mg/dL   Hgb urine dipstick SMALL (A) NEGATIVE   Bilirubin Urine NEGATIVE NEGATIVE   Ketones, ur NEGATIVE NEGATIVE mg/dL   Protein, ur 100 (A) NEGATIVE mg/dL   Nitrite POSITIVE (A) NEGATIVE   Leukocytes, UA LARGE (A) NEGATIVE  Urine microscopic-add on     Status: Abnormal   Collection Time: 08/07/15 10:51 PM  Result Value Ref Range   Squamous Epithelial / LPF 0-5 (A) NONE SEEN   WBC, UA TOO NUMEROUS TO COUNT 0 - 5 WBC/hpf   RBC / HPF 0-5 0 - 5 RBC/hpf   Bacteria, UA MANY (A) NONE SEEN  Basic metabolic  panel     Status: Abnormal   Collection Time: 08/07/15 11:38 PM  Result Value Ref Range   Sodium 140 135 - 145 mmol/L   Potassium 3.9 3.5 - 5.1 mmol/L   Chloride 107 101 - 111 mmol/L   CO2 24 22 - 32 mmol/L   Glucose, Bld 141 (H) 65 - 99 mg/dL   BUN 33 (H) 6 - 20 mg/dL   Creatinine, Ser 1.50 (H) 0.61 - 1.24 mg/dL   Calcium 9.3 8.9 - 10.3 mg/dL   GFR calc non Af Amer 41 (L) >60 mL/min   GFR calc Af Amer 47 (L) >60 mL/min    Comment: (NOTE) The eGFR has been calculated using the CKD EPI equation. This calculation has not been validated in all clinical situations. eGFR's persistently <60 mL/min signify possible Chronic Kidney Disease.    Anion gap 9 5 - 15  CBC with Differential     Status: Abnormal   Collection Time: 08/07/15 11:38 PM  Result Value Ref  Range   WBC 7.5 4.0 - 10.5 K/uL   RBC 4.42 4.22 - 5.81 MIL/uL   Hemoglobin 13.2 13.0 - 17.0 g/dL   HCT 40.9 39.0 - 52.0 %   MCV 92.5 78.0 - 100.0 fL   MCH 29.9 26.0 - 34.0 pg   MCHC 32.3 30.0 - 36.0 g/dL   RDW 14.8 11.5 - 15.5 %   Platelets 148 (L) 150 - 400 K/uL   Neutrophils Relative % 80 %   Neutro Abs 6.0 1.7 - 7.7 K/uL   Lymphocytes Relative 13 %   Lymphs Abs 1.0 0.7 - 4.0 K/uL   Monocytes Relative 6 %   Monocytes Absolute 0.4 0.1 - 1.0 K/uL   Eosinophils Relative 1 %   Eosinophils Absolute 0.1 0.0 - 0.7 K/uL   Basophils Relative 0 %   Basophils Absolute 0.0 0.0 - 0.1 K/uL  CBG monitoring, ED     Status: Abnormal   Collection Time: 08/08/15  3:16 AM  Result Value Ref Range   Glucose-Capillary 174 (H) 65 - 99 mg/dL  Glucose, capillary     Status: Abnormal   Collection Time: 08/08/15  3:58 AM  Result Value Ref Range   Glucose-Capillary 208 (H) 65 - 99 mg/dL   Comment 1 Notify RN   Type and screen [place]     Status: None   Collection Time: 08/08/15  4:55 AM  Result Value Ref Range   ABO/RH(D) A POS    Antibody Screen NEG    Sample Expiration 08/11/2015   ABO/Rh     Status: None   Collection Time: 08/08/15  4:55 AM  Result Value Ref Range   ABO/RH(D) A POS   Glucose, capillary     Status: Abnormal   Collection Time: 08/08/15 11:44 AM  Result Value Ref Range   Glucose-Capillary 127 (H) 65 - 99 mg/dL   Comment 1 Repeat Test    Comment 2 Document in Chart     Dg Chest 1 View  08/07/2015  CLINICAL DATA:  Unwitnessed fall 2 hours ago. EXAM: CHEST 1 VIEW COMPARISON:  10/31/2014 FINDINGS: Dual lead pacemaker well positioned. Heart size is normal. Mediastinal shadows are normal. The lungs are clear. No edema, infiltrate, collapse or effusion. No acute bone finding in the chest. IMPRESSION: No active disease.  Dual lead pacemaker. Electronically Signed   By: Nelson Chimes M.D.   On: 08/07/2015 23:28   Ct Head Wo Contrast  08/07/2015  CLINICAL DATA:  Unwitnessed fall 2 hours ago. Found on floor. Laceration of the back of the head. EXAM: CT HEAD WITHOUT CONTRAST CT CERVICAL SPINE WITHOUT CONTRAST TECHNIQUE: Multidetector CT imaging of the head and cervical spine was performed following the standard protocol without intravenous contrast. Multiplanar CT image reconstructions of the cervical spine were also generated. COMPARISON:  06/18/2015 FINDINGS: CT HEAD FINDINGS Right parietal scalp laceration. No underlying skull fracture. No radiopaque foreign object. The brain shows generalized atrophy. Again demonstrated is a subdural hematoma along the convexity on the left which appears mixed age. There are definitely hyperdense components. Maximal thickness in the middle cranial fossa is 1 cm. Maximal thickness over the left parietal region is 6 mm. No midline shift mild chronic small-vessel ischemic changes affect the white matter. CT CERVICAL SPINE FINDINGS Alignment is normal. No fracture. There is chronic degenerative spondylosis at C5-6 and C6-7 but no significant stenosis. Ordinary osteoarthritis of the C1-2 articulation. IMPRESSION: Mixed age subdural hematoma on the left, which was present on the study of 06/18/2015. There are mixed density components, but definitely with recent hyperdense components. Maximal thickness in the middle cranial fossa region is 10 mm. Maximal thickness in the parietal region is 6 mm. No midline shift. No skull fracture. Ordinary mild degenerative changes of the cervical spine. No traumatic finding. Critical Value/emergent results were called by telephone at the time of interpretation on 08/07/2015 at 11:45 pm to Dr. Daleen Bo , who verbally acknowledged these results. Electronically Signed   By: Nelson Chimes M.D.   On: 08/07/2015 23:49   Ct Cervical Spine Wo Contrast  08/07/2015  CLINICAL DATA:  Unwitnessed fall 2 hours ago. Found on floor. Laceration of the back of the head. EXAM: CT HEAD WITHOUT CONTRAST CT CERVICAL SPINE  WITHOUT CONTRAST TECHNIQUE: Multidetector CT imaging of the head and cervical spine was performed following the standard protocol without intravenous contrast. Multiplanar CT image reconstructions of the cervical spine were also generated. COMPARISON:  06/18/2015 FINDINGS: CT HEAD FINDINGS Right parietal scalp laceration. No underlying skull fracture. No radiopaque foreign object. The brain shows generalized atrophy. Again demonstrated is a subdural hematoma along the convexity on the left which appears mixed age. There are definitely hyperdense components. Maximal thickness in the middle cranial fossa is 1 cm. Maximal thickness over the left parietal region is 6 mm. No midline shift mild chronic small-vessel ischemic changes affect the white matter. CT CERVICAL SPINE FINDINGS Alignment is normal. No fracture. There is chronic degenerative spondylosis at C5-6 and C6-7 but no significant stenosis. Ordinary osteoarthritis of the C1-2 articulation. IMPRESSION: Mixed age subdural hematoma on the left, which was present on the study of 06/18/2015. There are mixed density components, but definitely with recent hyperdense components. Maximal thickness in the middle cranial fossa region is 10 mm. Maximal thickness in the parietal region is 6 mm. No midline shift. No skull fracture. Ordinary mild degenerative changes of the cervical spine. No traumatic finding. Critical Value/emergent results were called by telephone at the time of interpretation on 08/07/2015 at 11:45 pm to Dr. Daleen Bo , who verbally acknowledged these results. Electronically Signed   By: Nelson Chimes M.D.   On: 08/07/2015 23:49   Dg Hip Unilat With Pelvis 2-3 Views Right  08/07/2015  CLINICAL DATA:  Unwitnessed fall 2 hours ago. Dementia. Pain and deformity. EXAM: DG HIP (WITH OR WITHOUT PELVIS) 2-3V RIGHT COMPARISON:  None. FINDINGS: Angulated femoral neck fracture on the right. No pelvic fracture. Left hip intact. IMPRESSION: Angulated femoral neck  fracture on the right. Electronically Signed   By: Nelson Chimes M.D.   On: 08/07/2015 23:27    Review of Systems  Unable to perform ROS: dementia   Blood pressure 139/55, pulse 87, temperature 99.8 F (37.7 C), temperature source Oral, resp. rate 20, SpO2 96 %. Physical Examhent, staples in the right parietal area. Neck, no pain. Neuro  Oriented x 1. moves all 4 extremities. Cn, wnl. Sensory normal with pain in the right hip.ct head shows a mixed sdh in the left side which was present back 2 moths ago  Assessment/Plan: Observation. Repeat ct head in the next 24 hours. No contraindication for ortho surgery. Will follow  Adelae Yodice M 08/08/2015, 4:58 PM

## 2015-08-08 NOTE — ED Provider Notes (Signed)
LACERATION REPAIR Performed by: Sharlene Mottsartner, Lateefah Mallery W Authorized by: Sharlene Mottsartner, Nicholes Hibler W Consent: Verbal consent obtained. Risks and benefits: risks, benefits and alternatives were discussed Consent given by: patient Patient identity confirmed: provided demographic data Prepped and Draped in normal sterile fashion Wound explored  Laceration Location: Right posterior head  Laceration Length: 4.5cm  No Foreign Bodies seen or palpated  Irrigation method: syringe Amount of cleaning: standard  Skin closure: Staples   Number of sutures: 6   Patient tolerance: Patient tolerated the procedure well with no immediate complications.   Joycie PeekBenjamin Corey Caulfield, PA-C 08/08/15 0105  Mancel BaleElliott Wentz, MD 08/08/15 [phone]

## 2015-08-09 ENCOUNTER — Inpatient Hospital Stay (HOSPITAL_COMMUNITY): Payer: Medicare Other | Admitting: Certified Registered Nurse Anesthetist

## 2015-08-09 ENCOUNTER — Inpatient Hospital Stay (HOSPITAL_COMMUNITY): Payer: Medicare Other

## 2015-08-09 ENCOUNTER — Encounter (HOSPITAL_COMMUNITY)
Admission: EM | Disposition: A | Discharge status: Skilled Nursing Facility | Payer: Self-pay | Source: Home / Self Care | Attending: Internal Medicine

## 2015-08-09 ENCOUNTER — Encounter (HOSPITAL_COMMUNITY): Payer: Self-pay | Admitting: Certified Registered Nurse Anesthetist

## 2015-08-09 DIAGNOSIS — Z950 Presence of cardiac pacemaker: Secondary | ICD-10-CM

## 2015-08-09 DIAGNOSIS — I10 Essential (primary) hypertension: Secondary | ICD-10-CM

## 2015-08-09 DIAGNOSIS — F0390 Unspecified dementia without behavioral disturbance: Secondary | ICD-10-CM

## 2015-08-09 HISTORY — PX: HIP ARTHROPLASTY: SHX981

## 2015-08-09 LAB — GLUCOSE, CAPILLARY
GLUCOSE-CAPILLARY: 143 mg/dL — AB (ref 65–99)
GLUCOSE-CAPILLARY: 265 mg/dL — AB (ref 65–99)
Glucose-Capillary: 198 mg/dL — ABNORMAL HIGH (ref 65–99)
Glucose-Capillary: 202 mg/dL — ABNORMAL HIGH (ref 65–99)
Glucose-Capillary: 276 mg/dL — ABNORMAL HIGH (ref 65–99)

## 2015-08-09 SURGERY — HEMIARTHROPLASTY, HIP, DIRECT ANTERIOR APPROACH, FOR FRACTURE
Anesthesia: General | Site: Hip | Laterality: Right

## 2015-08-09 MED ORDER — ACETAMINOPHEN 325 MG PO TABS
650.0000 mg | ORAL_TABLET | Freq: Four times a day (QID) | ORAL | Status: DC | PRN
  Administered 2015-08-10 – 2015-08-14 (×4): 650 mg via ORAL
  Filled 2015-08-09 (×4): qty 2

## 2015-08-09 MED ORDER — SUGAMMADEX SODIUM 200 MG/2ML IV SOLN
INTRAVENOUS | Status: DC | PRN
  Administered 2015-08-09: 150 mg via INTRAVENOUS

## 2015-08-09 MED ORDER — FENTANYL CITRATE (PF) 100 MCG/2ML IJ SOLN: 25.0000 ug | INTRAMUSCULAR | Status: DC | PRN

## 2015-08-09 MED ORDER — ONDANSETRON HCL 4 MG/2ML IJ SOLN: 4.0000 mg | Freq: Once | INTRAMUSCULAR | Status: DC | PRN

## 2015-08-09 MED ORDER — METOCLOPRAMIDE HCL 5 MG/ML IJ SOLN: 5.0000 mg | Freq: Three times a day (TID) | INTRAMUSCULAR | Status: DC | PRN

## 2015-08-09 MED ORDER — ONDANSETRON HCL 4 MG/2ML IJ SOLN: 4.0000 mg | Freq: Four times a day (QID) | INTRAMUSCULAR | Status: DC | PRN

## 2015-08-09 MED ORDER — POLYETHYLENE GLYCOL 3350 17 G PO PACK
17.0000 g | PACK | Freq: Every day | ORAL | Status: DC | PRN
Start: 2015-08-09 — End: 2015-08-14
  Administered 2015-08-13 (×2): 17 g via ORAL
  Filled 2015-08-09 (×2): qty 1

## 2015-08-09 MED ORDER — OXYCODONE HCL 5 MG PO TABS
5.0000 mg | ORAL_TABLET | ORAL | Status: DC | PRN
  Administered 2015-08-10: 10 mg via ORAL
  Filled 2015-08-09: qty 2

## 2015-08-09 MED ORDER — PHENOL 1.4 % MT LIQD: 1.0000 | OROMUCOSAL | Status: DC | PRN

## 2015-08-09 MED ORDER — LIDOCAINE HCL (CARDIAC) 20 MG/ML IV SOLN
INTRAVENOUS | Status: DC | PRN
  Administered 2015-08-09: 40 mg via INTRAVENOUS

## 2015-08-09 MED ORDER — ROCURONIUM BROMIDE 100 MG/10ML IV SOLN
INTRAVENOUS | Status: DC | PRN
  Administered 2015-08-09: 10 mg via INTRAVENOUS
  Administered 2015-08-09: 40 mg via INTRAVENOUS

## 2015-08-09 MED ORDER — ONDANSETRON HCL 4 MG PO TABS: 4.0000 mg | ORAL_TABLET | Freq: Four times a day (QID) | ORAL | Status: DC | PRN

## 2015-08-09 MED ORDER — METOCLOPRAMIDE HCL 5 MG PO TABS: 5.0000 mg | ORAL_TABLET | Freq: Three times a day (TID) | ORAL | Status: DC | PRN

## 2015-08-09 MED ORDER — BUPIVACAINE HCL (PF) 0.25 % IJ SOLN
INTRAMUSCULAR | Status: DC | PRN
  Administered 2015-08-09: 10 mL

## 2015-08-09 MED ORDER — ONDANSETRON HCL 4 MG/2ML IJ SOLN
INTRAMUSCULAR | Status: DC | PRN
  Administered 2015-08-09: 4 mg via INTRAVENOUS

## 2015-08-09 MED ORDER — MENTHOL 3 MG MT LOZG: 1.0000 | LOZENGE | OROMUCOSAL | Status: DC | PRN

## 2015-08-09 MED ORDER — FENTANYL CITRATE (PF) 100 MCG/2ML IJ SOLN
INTRAMUSCULAR | Status: DC | PRN
  Administered 2015-08-09 (×2): 50 ug via INTRAVENOUS

## 2015-08-09 MED ORDER — PHENYLEPHRINE HCL 10 MG/ML IJ SOLN
10.0000 mg | INTRAVENOUS | Status: DC | PRN
  Administered 2015-08-09: 15 ug/min via INTRAVENOUS

## 2015-08-09 MED ORDER — 0.9 % SODIUM CHLORIDE (POUR BTL) OPTIME
TOPICAL | Status: DC | PRN
  Administered 2015-08-09: 1000 mL

## 2015-08-09 MED ORDER — FENTANYL CITRATE (PF) 250 MCG/5ML IJ SOLN
INTRAMUSCULAR | Status: AC
Start: 2015-08-09 — End: 2015-08-09
  Filled 2015-08-09: qty 5

## 2015-08-09 MED ORDER — DOCUSATE SODIUM 100 MG PO CAPS
100.0000 mg | ORAL_CAPSULE | Freq: Two times a day (BID) | ORAL | Status: DC
  Administered 2015-08-09 – 2015-08-14 (×10): 100 mg via ORAL
  Filled 2015-08-09 (×10): qty 1

## 2015-08-09 MED ORDER — ACETAMINOPHEN 650 MG RE SUPP: 650.0000 mg | Freq: Four times a day (QID) | RECTAL | Status: DC | PRN

## 2015-08-09 MED ORDER — MORPHINE SULFATE (PF) 2 MG/ML IV SOLN
0.5000 mg | INTRAVENOUS | Status: DC | PRN
  Administered 2015-08-09 – 2015-08-10 (×2): 0.5 mg via INTRAVENOUS
  Filled 2015-08-09 (×2): qty 1

## 2015-08-09 MED ORDER — BUPIVACAINE HCL (PF) 0.25 % IJ SOLN
INTRAMUSCULAR | Status: AC
  Filled 2015-08-09: qty 30

## 2015-08-09 MED ORDER — LACTATED RINGERS IV SOLN
INTRAVENOUS | Status: DC | PRN
  Administered 2015-08-09 (×2): via INTRAVENOUS

## 2015-08-09 MED ORDER — PROPOFOL 10 MG/ML IV BOLUS
INTRAVENOUS | Status: DC | PRN
  Administered 2015-08-09: 80 mg via INTRAVENOUS

## 2015-08-09 MED ORDER — DEXTROSE 5 % IV SOLN
3.0000 g | INTRAVENOUS | Status: DC | PRN
  Administered 2015-08-09: 2 g via INTRAVENOUS

## 2015-08-09 MED ORDER — PROPOFOL 10 MG/ML IV BOLUS
INTRAVENOUS | Status: AC
Start: 2015-08-09 — End: 2015-08-09
  Filled 2015-08-09: qty 20

## 2015-08-09 MED ORDER — EPHEDRINE SULFATE 50 MG/ML IJ SOLN
INTRAMUSCULAR | Status: DC | PRN
Start: 2015-08-09 — End: 2015-08-09
  Administered 2015-08-09 (×2): 10 mg via INTRAVENOUS

## 2015-08-09 MED ORDER — HYDROCODONE-ACETAMINOPHEN 5-325 MG PO TABS
1.0000 | ORAL_TABLET | Freq: Four times a day (QID) | ORAL | Status: DC | PRN
  Administered 2015-08-09 (×2): 2 via ORAL
  Administered 2015-08-11 – 2015-08-13 (×4): 1 via ORAL
  Filled 2015-08-09: qty 2
  Filled 2015-08-09 (×3): qty 1
  Filled 2015-08-09 (×2): qty 2
  Filled 2015-08-09: qty 1

## 2015-08-09 SURGICAL SUPPLY — 62 items
APL SKNCLS STERI-STRIP NONHPOA (GAUZE/BANDAGES/DRESSINGS)
BENZOIN TINCTURE PRP APPL 2/3 (GAUZE/BANDAGES/DRESSINGS) ×1 IMPLANT
BLADE SAW SAG 73X25 THK (BLADE) ×2
BLADE SAW SGTL 73X25 THK (BLADE) ×1 IMPLANT
BLADE SURG ROTATE 9660 (MISCELLANEOUS) IMPLANT
BRUSH FEMORAL CANAL (MISCELLANEOUS) IMPLANT
CAPT HIP HEMI 1 ×3 IMPLANT
CLOSURE WOUND 1/2 X4 (GAUZE/BANDAGES/DRESSINGS) ×1
COVER BACK TABLE 24X17X13 BIG (DRAPES) IMPLANT
DRAPE IMP U-DRAPE 54X76 (DRAPES) ×1 IMPLANT
DRAPE INCISE IOBAN 66X45 STRL (DRAPES) ×2 IMPLANT
DRAPE ORTHO SPLIT 77X108 STRL (DRAPES) ×6
DRAPE SURG ORHT 6 SPLT 77X108 (DRAPES) ×2 IMPLANT
DRAPE U-SHAPE 47X51 STRL (DRAPES) ×3 IMPLANT
DRSG MEPILEX BORDER 4X8 (GAUZE/BANDAGES/DRESSINGS) ×3 IMPLANT
DRSG PAD ABDOMINAL 8X10 ST (GAUZE/BANDAGES/DRESSINGS) ×2 IMPLANT
DURAPREP 26ML APPLICATOR (WOUND CARE) ×3 IMPLANT
ELECT CAUTERY BLADE 6.4 (BLADE) ×3 IMPLANT
ELECT REM PT RETURN 9FT ADLT (ELECTROSURGICAL) ×3
ELECTRODE REM PT RTRN 9FT ADLT (ELECTROSURGICAL) ×1 IMPLANT
EVACUATOR 1/8 PVC DRAIN (DRAIN) IMPLANT
FACESHIELD WRAPAROUND (MASK) IMPLANT
GAUZE SPONGE 4X4 12PLY STRL (GAUZE/BANDAGES/DRESSINGS) IMPLANT
GAUZE XEROFORM 5X9 LF (GAUZE/BANDAGES/DRESSINGS) ×1 IMPLANT
GLOVE BIOGEL PI IND STRL 8 (GLOVE) ×2 IMPLANT
GLOVE BIOGEL PI INDICATOR 8 (GLOVE) ×4
GLOVE ORTHO TXT STRL SZ7.5 (GLOVE) ×6 IMPLANT
GOWN STRL REUS W/ TWL LRG LVL3 (GOWN DISPOSABLE) ×3 IMPLANT
GOWN STRL REUS W/ TWL XL LVL3 (GOWN DISPOSABLE) ×1 IMPLANT
GOWN STRL REUS W/TWL 2XL LVL3 (GOWN DISPOSABLE) IMPLANT
GOWN STRL REUS W/TWL LRG LVL3 (GOWN DISPOSABLE) ×9
GOWN STRL REUS W/TWL XL LVL3 (GOWN DISPOSABLE) ×3
HANDPIECE INTERPULSE COAX TIP (DISPOSABLE)
IMMOBILIZER KNEE 22 UNIV (SOFTGOODS) ×2 IMPLANT
KIT BASIN OR (CUSTOM PROCEDURE TRAY) ×3 IMPLANT
KIT ROOM TURNOVER OR (KITS) ×3 IMPLANT
MANIFOLD NEPTUNE II (INSTRUMENTS) ×3 IMPLANT
NDL SUT 2 .5 CRC MAYO 1.732X (NEEDLE) IMPLANT
NEEDLE HYPO 25GX1X1/2 BEV (NEEDLE) ×3 IMPLANT
NEEDLE MAYO TAPER (NEEDLE)
NS IRRIG 1000ML POUR BTL (IV SOLUTION) ×3 IMPLANT
PACK TOTAL JOINT (CUSTOM PROCEDURE TRAY) ×3 IMPLANT
PACK UNIVERSAL I (CUSTOM PROCEDURE TRAY) ×3 IMPLANT
PAD ARMBOARD 7.5X6 YLW CONV (MISCELLANEOUS) ×8 IMPLANT
PIN STEINMAN 3/16 (PIN) ×3 IMPLANT
SET HNDPC FAN SPRY TIP SCT (DISPOSABLE) IMPLANT
SPONGE LAP 4X18 X RAY DECT (DISPOSABLE) ×2 IMPLANT
STAPLER VISISTAT 35W (STAPLE) ×3 IMPLANT
STRIP CLOSURE SKIN 1/2X4 (GAUZE/BANDAGES/DRESSINGS) ×2 IMPLANT
SUCTION FRAZIER HANDLE 10FR (MISCELLANEOUS) ×2
SUCTION TUBE FRAZIER 10FR DISP (MISCELLANEOUS) ×1 IMPLANT
SUT ETHIBOND NAB CT1 #1 30IN (SUTURE) ×9 IMPLANT
SUT TICRON (SUTURE) IMPLANT
SUT VIC AB 2-0 CT1 27 (SUTURE) ×3
SUT VIC AB 2-0 CT1 TAPERPNT 27 (SUTURE) ×3 IMPLANT
SUT VICRYL 0 TIES 12 18 (SUTURE) IMPLANT
SYR CONTROL 10ML LL (SYRINGE) ×3 IMPLANT
TOWEL OR 17X24 6PK STRL BLUE (TOWEL DISPOSABLE) ×3 IMPLANT
TOWEL OR 17X26 10 PK STRL BLUE (TOWEL DISPOSABLE) ×3 IMPLANT
TOWER CARTRIDGE SMART MIX (DISPOSABLE) IMPLANT
TRAY FOLEY CATH 16FRSI W/METER (SET/KITS/TRAYS/PACK) IMPLANT
WATER STERILE IRR 1000ML POUR (IV SOLUTION) IMPLANT

## 2015-08-09 NOTE — Anesthesia Postprocedure Evaluation (Signed)
Anesthesia Post Note  Patient: Dakota White  Procedure(s) Performed: Procedure(s) (LRB): HIP HEMIARTHROPLASTY POSTERIOR (Right)  Patient location during evaluation: PACU Anesthesia Type: General Level of consciousness: awake, confused and patient cooperative Pain management: pain level controlled Vital Signs Assessment: post-procedure vital signs reviewed and stable Respiratory status: spontaneous breathing, nonlabored ventilation and respiratory function stable Cardiovascular status: blood pressure returned to baseline Anesthetic complications: no    Last Vitals:  Filed Vitals:   08/09/15 1000 08/09/15 1030  BP: 156/75 159/64  Pulse: 86 87  Temp: 36.7 C 37.2 C  Resp: 17 16    Last Pain:  Filed Vitals:   08/09/15 1057  PainSc: 0-No pain                 Tymeshia Awan COKER

## 2015-08-09 NOTE — Progress Notes (Signed)
Patient ID: Dakota White, male   DOB: November 10, 1928, 82 y.o.   MRN: [id number] Post op femur fracture.willl follow

## 2015-08-09 NOTE — Brief Op Note (Signed)
08/07/2015 - 08/09/2015  8:47 AM  PATIENT:  Verita LambHowell W Carmack  82 y.o. male  PRE-OPERATIVE DIAGNOSIS:  right femoral neck fracture  POST-OPERATIVE DIAGNOSIS:  right femoral neck fracture  PROCEDURE:  Procedure(s): HIP HEMIARTHROPLASTY POSTERIOR (Right)  SURGEON:  Surgeon(s) and Role:    * Eldred MangesMark C Mirca Yale, MD - Primary  PHYSICIAN ASSISTANT:   ASSISTANTS: extra scrub   ANESTHESIA:   local and general  EBL:  Total I/O In: -  Out: 200 [Urine:100; Blood:100]  BLOOD ADMINISTERED:none  DRAINS: none   LOCAL MEDICATIONS USED:  MARCAINE     SPECIMEN:  No Specimen  DISPOSITION OF SPECIMEN:  N/A  COUNTS:  YES  TOURNIQUET:  * No tourniquets in log *  DICTATION: .Other Dictation: Dictation Number [id number]  PLAN OF CARE: already inpt  PATIENT DISPOSITION:  PACU - hemodynamically stable.   Delay start of Pharmacological VTE agent (>24hrs) due to surgical blood loss or risk of bleeding: none due to  SDH

## 2015-08-09 NOTE — Op Note (Signed)
NAMWaynetta Pean:  Marinos, Jonathyn               ACCOUNT NO.:  [id number]  MEDICAL RECORD NO.:  [id number]  LOCATION:  [facility]                         FACILITY:  [facility]  PHYSICIAN:  Jackilyn Umphlett C. Ophelia CharterYates, M.D.    DATE OF BIRTH:  20-Dec-1928  DATE OF PROCEDURE:  08/09/2015 DATE OF DISCHARGE:                              OPERATIVE REPORT   PREOPERATIVE DIAGNOSIS:  Right displaced femoral neck fracture.  POSTOPERATIVE DIAGNOSIS:  Right displaced femoral neck fracture.  PROCEDURE:  Right hip hemiarthroplasty, DePuy press-fit #5 stem, +0 neck, 53 mm monopolar ball.  SURGEON:  Ellayna Hilligoss C. Ophelia CharterYates, M.D.  ANESTHESIA:  General plus Marcaine local, 10 mL.  COMPLICATIONS:  None.  INDICATIONS:  An 82 year old male fell with subdural hematoma.  He had a previous fall a month ago with subdural hematoma.  In this fall, he suffered a femoral neck fracture.  The patient has problems with confusion and memory loss.  X-rays showed displaced femoral neck fracture.  DESCRIPTION OF PROCEDURE:  After standard prepping and draping, the patient was placed in lateral position.  The patient has been intubated, Ancef was given prophylactically 2 g.  Split sheets, drapes, impervious stockinette, Coban, sterile skin marker, and Betadine, Steri-Drape x2 was used to seal the skin and time-out procedure was completed. Posterior approach was made.  Gluteus maximus was split in line with the fibers.  Piriformis was tagged.  Posterior capsule was opened, the 2 edges were tagged.  Neck was cut 1 fingerbreadth above the lesser trochanter.  Newman PiesBall was removed with a corkscrew.  Acetabulum articular surface looked good.  Preparation of the canal was performed with lateralizer, cookie cutter, reamer, then progressive broaching up to a #5, which gave a nice tight fit.  Trial was inserted, +0 neck.  Hip was able to be flexed to 90 degrees.  Leg lengths were equal.  Good stability, tray shuck.  Permanent stem was inserted, monopolar was assembled,  popped on, impacted, was tight.  Sciatic nerve was protected again, the hip was reduced.  Identical findings and good stability, identical leg lengths, irrigation with saline solution and then repaired the piriformis, gluteus medius, repair of the capsule, tensor fascia was repaired with interrupted #1 Ethibond, 2-0 Vicryl subcutaneous tissue, skin staple closure, Marcaine infiltration, postop dressing, and knee immobilizer.  Instrument counts and needle counts were correct.     Glennis Borger C. Ophelia CharterYates, M.D.     MCY/MEDQ  D:  08/09/2015  T:  08/09/2015  Job:  [id number]

## 2015-08-09 NOTE — Anesthesia Procedure Notes (Signed)
Procedure Name: Intubation Date/Time: 08/09/2015 7:52 AM Performed by: Dairl PonderJIANG, Hari Casaus Pre-anesthesia Checklist: Patient identified, Emergency Drugs available, Suction available, Patient being monitored and Timeout performed Patient Re-evaluated:Patient Re-evaluated prior to inductionOxygen Delivery Method: Circle system utilized Preoxygenation: Pre-oxygenation with 100% oxygen Intubation Type: IV induction Ventilation: Mask ventilation without difficulty Laryngoscope Size: Mac and 4 Grade View: Grade I Tube type: Oral Tube size: 7.5 mm Number of attempts: 1 Airway Equipment and Method: Stylet Placement Confirmation: ETT inserted through vocal cords under direct vision,  positive ETCO2 and breath sounds checked- equal and bilateral Secured at: 23 cm Tube secured with: Tape Dental Injury: Teeth and Oropharynx as per pre-operative assessment

## 2015-08-09 NOTE — H&P (View-Only) (Signed)
Reason for Consult:displaced right femoral neck fracture Referring Physician: Charlies Silvers MD  Dakota White is an 82 y.o. male.  HPI: 82 yo with previous fall last month with SDH had new unwitness fall with right displaced femoral neck fracture and mixed density suggesting some new component to SDH.  Neurosurgical eval pending.  Cardiology has seen and cleared pt.  Dementia , confusion, diabetes, CHF among medical problems with EF 40-45%.   Past Medical History  Diagnosis Date  . Type II or unspecified type diabetes mellitus without mention of complication, uncontrolled   . Essential hypertension   . Mental disorder   . Dementia   . Peripheral neuropathy ([place])   . Allergic rhinitis   . Asthma with bronchitis   . Complete heart block ([place])     a. Requiring permanent [employer] Jude DDD pacemaker by Dr. Lovena Le in 2013.  Marland Kitchen Peripheral vascular disease, unspecified ([place])   . Cardiac pacemaker in situ   . Mixed hyperlipidemia   . Chronic combined systolic and diastolic CHF (congestive heart failure) ([place])     a. EF noted to be down 10/2014 - 2D echo 10/17/14: EF 35-40%, mild LVH,  moderate HK of mid-apical anteroseptal and anterior myocardium, HK of inferior myocardium, grade 1 DD, mild MR.  . Diabetes mellitus with renal complications ([place])   . Dementia   . BPH (benign prostatic hyperplasia)   . General weakness   . Anemia of chronic disease   . Thrombocytopenia [facility])     Past Surgical History  Procedure Laterality Date  . Rotator cuff repair    . Pacemaker insertion  Dec 2013  . Permanent pacemaker insertion N/A 01/31/2012    Procedure: PERMANENT PACEMAKER INSERTION;  Surgeon: Evans Lance, MD;  [facility] CATH LAB;  Service: Cardiovascular;  Laterality: N/A;    Family History  Problem Relation Age of Onset  . Heart disease Mother   . Alzheimer's disease Father   . Diabetes Sister   . COPD Brother   . Alzheimer's disease Sister     Social History:  reports that he has quit smoking.  His smoking use included Cigarettes. He has never used smokeless tobacco. He reports that he does not drink alcohol or use illicit drugs.  Allergies:  Allergies  Allergen Reactions  . Actos [Pioglitazone]     "makes me feel lousy and makes me bleed"  . Ramipril     "pt not aware of allergy"    Medications: I have reviewed the patient's current medications.  Results for orders placed or performed during the hospital encounter of 08/07/15 (from the past 48 hour(s))  Urinalysis, Routine w reflex microscopic     Status: Abnormal   Collection Time: 08/07/15 10:51 PM  Result Value Ref Range   Color, Urine YELLOW YELLOW   APPearance TURBID (A) CLEAR   Specific Gravity, Urine 1.016 1.005 - 1.030   pH 6.0 5.0 - 8.0   Glucose, UA NEGATIVE NEGATIVE mg/dL   Hgb urine dipstick SMALL (A) NEGATIVE   Bilirubin Urine NEGATIVE NEGATIVE   Ketones, ur NEGATIVE NEGATIVE mg/dL   Protein, ur 100 (A) NEGATIVE mg/dL   Nitrite POSITIVE (A) NEGATIVE   Leukocytes, UA LARGE (A) NEGATIVE  Urine microscopic-add on     Status: Abnormal   Collection Time: 08/07/15 10:51 PM  Result Value Ref Range   Squamous Epithelial / LPF 0-5 (A) NONE SEEN   WBC, UA TOO NUMEROUS TO COUNT 0 - 5 WBC/hpf   RBC / HPF 0-5  0 - 5 RBC/hpf   Bacteria, UA MANY (A) NONE SEEN  Basic metabolic panel     Status: Abnormal   Collection Time: 08/07/15 11:38 PM  Result Value Ref Range   Sodium 140 135 - 145 mmol/L   Potassium 3.9 3.5 - 5.1 mmol/L   Chloride 107 101 - 111 mmol/L   CO2 24 22 - 32 mmol/L   Glucose, Bld 141 (H) 65 - 99 mg/dL   BUN 33 (H) 6 - 20 mg/dL   Creatinine, Ser 1.50 (H) 0.61 - 1.24 mg/dL   Calcium 9.3 8.9 - 10.3 mg/dL   GFR calc non Af Amer 41 (L) >60 mL/min   GFR calc Af Amer 47 (L) >60 mL/min    Comment: (NOTE) The eGFR has been calculated using the CKD EPI equation. This calculation has not been validated in all clinical situations. eGFR's persistently <60 mL/min signify possible Chronic Kidney Disease.     Anion gap 9 5 - 15  CBC with Differential     Status: Abnormal   Collection Time: 08/07/15 11:38 PM  Result Value Ref Range   WBC 7.5 4.0 - 10.5 K/uL   RBC 4.42 4.22 - 5.81 MIL/uL   Hemoglobin 13.2 13.0 - 17.0 g/dL   HCT 40.9 39.0 - 52.0 %   MCV 92.5 78.0 - 100.0 fL   MCH 29.9 26.0 - 34.0 pg   MCHC 32.3 30.0 - 36.0 g/dL   RDW 14.8 11.5 - 15.5 %   Platelets 148 (L) 150 - 400 K/uL   Neutrophils Relative % 80 %   Neutro Abs 6.0 1.7 - 7.7 K/uL   Lymphocytes Relative 13 %   Lymphs Abs 1.0 0.7 - 4.0 K/uL   Monocytes Relative 6 %   Monocytes Absolute 0.4 0.1 - 1.0 K/uL   Eosinophils Relative 1 %   Eosinophils Absolute 0.1 0.0 - 0.7 K/uL   Basophils Relative 0 %   Basophils Absolute 0.0 0.0 - 0.1 K/uL  CBG monitoring, ED     Status: Abnormal   Collection Time: 08/08/15  3:16 AM  Result Value Ref Range   Glucose-Capillary 174 (H) 65 - 99 mg/dL  Glucose, capillary     Status: Abnormal   Collection Time: 08/08/15  3:58 AM  Result Value Ref Range   Glucose-Capillary 208 (H) 65 - 99 mg/dL   Comment 1 Notify RN   Type and screen [place]     Status: None   Collection Time: 08/08/15  4:55 AM  Result Value Ref Range   ABO/RH(D) A POS    Antibody Screen NEG    Sample Expiration 08/11/2015   ABO/Rh     Status: None   Collection Time: 08/08/15  4:55 AM  Result Value Ref Range   ABO/RH(D) A POS     Dg Chest 1 View  08/07/2015  CLINICAL DATA:  Unwitnessed fall 2 hours ago. EXAM: CHEST 1 VIEW COMPARISON:  10/31/2014 FINDINGS: Dual lead pacemaker well positioned. Heart size is normal. Mediastinal shadows are normal. The lungs are clear. No edema, infiltrate, collapse or effusion. No acute bone finding in the chest. IMPRESSION: No active disease.  Dual lead pacemaker. Electronically Signed   By: Nelson Chimes M.D.   On: 08/07/2015 23:28   Ct Head Wo Contrast  08/07/2015  CLINICAL DATA:  Unwitnessed fall 2 hours ago. Found on floor. Laceration of the back of the head. EXAM:  CT HEAD WITHOUT CONTRAST CT CERVICAL SPINE WITHOUT CONTRAST TECHNIQUE: Multidetector CT  imaging of the head and cervical spine was performed following the standard protocol without intravenous contrast. Multiplanar CT image reconstructions of the cervical spine were also generated. COMPARISON:  06/18/2015 FINDINGS: CT HEAD FINDINGS Right parietal scalp laceration. No underlying skull fracture. No radiopaque foreign object. The brain shows generalized atrophy. Again demonstrated is a subdural hematoma along the convexity on the left which appears mixed age. There are definitely hyperdense components. Maximal thickness in the middle cranial fossa is 1 cm. Maximal thickness over the left parietal region is 6 mm. No midline shift mild chronic small-vessel ischemic changes affect the white matter. CT CERVICAL SPINE FINDINGS Alignment is normal. No fracture. There is chronic degenerative spondylosis at C5-6 and C6-7 but no significant stenosis. Ordinary osteoarthritis of the C1-2 articulation. IMPRESSION: Mixed age subdural hematoma on the left, which was present on the study of 06/18/2015. There are mixed density components, but definitely with recent hyperdense components. Maximal thickness in the middle cranial fossa region is 10 mm. Maximal thickness in the parietal region is 6 mm. No midline shift. No skull fracture. Ordinary mild degenerative changes of the cervical spine. No traumatic finding. Critical Value/emergent results were called by telephone at the time of interpretation on 08/07/2015 at 11:45 pm to Dr. Daleen Bo , who verbally acknowledged these results. Electronically Signed   By: Nelson Chimes M.D.   On: 08/07/2015 23:49   Ct Cervical Spine Wo Contrast  08/07/2015  CLINICAL DATA:  Unwitnessed fall 2 hours ago. Found on floor. Laceration of the back of the head. EXAM: CT HEAD WITHOUT CONTRAST CT CERVICAL SPINE WITHOUT CONTRAST TECHNIQUE: Multidetector CT imaging of the head and cervical spine was  performed following the standard protocol without intravenous contrast. Multiplanar CT image reconstructions of the cervical spine were also generated. COMPARISON:  06/18/2015 FINDINGS: CT HEAD FINDINGS Right parietal scalp laceration. No underlying skull fracture. No radiopaque foreign object. The brain shows generalized atrophy. Again demonstrated is a subdural hematoma along the convexity on the left which appears mixed age. There are definitely hyperdense components. Maximal thickness in the middle cranial fossa is 1 cm. Maximal thickness over the left parietal region is 6 mm. No midline shift mild chronic small-vessel ischemic changes affect the white matter. CT CERVICAL SPINE FINDINGS Alignment is normal. No fracture. There is chronic degenerative spondylosis at C5-6 and C6-7 but no significant stenosis. Ordinary osteoarthritis of the C1-2 articulation. IMPRESSION: Mixed age subdural hematoma on the left, which was present on the study of 06/18/2015. There are mixed density components, but definitely with recent hyperdense components. Maximal thickness in the middle cranial fossa region is 10 mm. Maximal thickness in the parietal region is 6 mm. No midline shift. No skull fracture. Ordinary mild degenerative changes of the cervical spine. No traumatic finding. Critical Value/emergent results were called by telephone at the time of interpretation on 08/07/2015 at 11:45 pm to Dr. Daleen Bo , who verbally acknowledged these results. Electronically Signed   By: Nelson Chimes M.D.   On: 08/07/2015 23:49   Dg Hip Unilat With Pelvis 2-3 Views Right  08/07/2015  CLINICAL DATA:  Unwitnessed fall 2 hours ago. Dementia. Pain and deformity. EXAM: DG HIP (WITH OR WITHOUT PELVIS) 2-3V RIGHT COMPARISON:  None. FINDINGS: Angulated femoral neck fracture on the right. No pelvic fracture. Left hip intact. IMPRESSION: Angulated femoral neck fracture on the right. Electronically Signed   By: Nelson Chimes M.D.   On: 08/07/2015  23:27    Review of Systems  Constitutional: Negative for  fever and chills.  Eyes: Negative for blurred vision.  Cardiovascular: Negative for chest pain.       Pacemaker  Gastrointestinal: Negative for abdominal pain.  Musculoskeletal: Positive for falls.  Neurological: Positive for sensory change and focal weakness.  Psychiatric/Behavioral: Positive for memory loss.   Blood pressure 139/55, pulse 87, temperature 99.8 F (37.7 C), temperature source Oral, resp. rate 20, SpO2 96 %. Physical Exam  HENT:  Head: Normocephalic.  Eyes: Pupils are equal, round, and reactive to light.  Neck: No thyromegaly present.  Cardiovascular: Normal rate.   Respiratory: Effort normal and breath sounds normal.  GI: Soft.  Genitourinary:  Penile inplant  Musculoskeletal:  Right LE short and ER. Pulses intact to foot.   Skin: Skin is warm and dry. No rash noted. No erythema.  Psychiatric:  Some confusion    Assessment/Plan:     Right diplaced femoral neck fracture. Will proceed with hemiarthroplasty in AM if Neurosurgery clears patient. Will hold on DVT proph. Due to SDH.   Reason Helzer C 08/08/2015, 11:28 AM

## 2015-08-09 NOTE — Anesthesia Preprocedure Evaluation (Signed)
Anesthesia Evaluation  Patient identified by MRN, date of birth, ID band Patient awake    Reviewed: Allergy & Precautions, NPO status , Patient's Chart, lab work & pertinent test results  Airway Mallampati: II  TM Distance: >3 FB Neck ROM: Full    Dental  (+) Teeth Intact   Pulmonary former smoker,    breath sounds clear to auscultation       Cardiovascular hypertension,  Rhythm:Regular Rate:Normal     Neuro/Psych    GI/Hepatic   Endo/Other  diabetes  Renal/GU      Musculoskeletal   Abdominal   Peds  Hematology   Anesthesia Other Findings   Reproductive/Obstetrics                             Anesthesia Physical Anesthesia Plan  ASA: III  Anesthesia Plan: General   Post-op Pain Management:    Induction: Intravenous  Airway Management Planned: Oral ETT  Additional Equipment:   Intra-op Plan:   Post-operative Plan: Extubation in OR  Informed Consent: I have reviewed the patients History and Physical, chart, labs and discussed the procedure including the risks, benefits and alternatives for the proposed anesthesia with the patient or authorized representative who has indicated his/her understanding and acceptance.     Plan Discussed with: CRNA and Anesthesiologist  Anesthesia Plan Comments:         Anesthesia Quick Evaluation

## 2015-08-09 NOTE — OR Nursing (Signed)
Portable pelvis x-ray obtained.

## 2015-08-09 NOTE — Interval H&P Note (Signed)
History and Physical Interval Note:  08/09/2015 7:34 AM  Dakota White W Riegler  has presented today for surgery, with the diagnosis of right neck fx  The various methods of treatment have been discussed with the patient and family. After consideration of risks, benefits and other options for treatment, the patient has consented to  Procedure(s): HIP HEMIARTHROPLASTY POSTERIOR (Right) as a surgical intervention .  The patient's history has been reviewed, patient examined, no change in status, stable for surgery.  I have reviewed the patient's chart and labs.  Questions were answered to the patient's satisfaction.     YATES,MARK C

## 2015-08-09 NOTE — Transfer of Care (Signed)
Immediate Anesthesia Transfer of Care Note  Patient: Dakota White  Procedure(s) Performed: Procedure(s): HIP HEMIARTHROPLASTY POSTERIOR (Right)  Patient Location: PACU  Anesthesia Type:General  Level of Consciousness: awake and alert   Airway & Oxygen Therapy: Patient Spontanous Breathing and Patient connected to nasal cannula oxygen  Post-op Assessment: Report given to RN and Post -op Vital signs reviewed and stable  Post vital signs: Reviewed and stable  Last Vitals:  Filed Vitals:   08/09/15 0024 08/09/15 0434  BP: 143/53 151/56  Pulse: 83 87  Temp:  36.4 C  Resp:      Last Pain:  Filed Vitals:   08/09/15 0437  PainSc: 0-No pain      Patients Stated Pain Goal: 2 (08/08/15 0720)  Complications: No apparent anesthesia complications

## 2015-08-09 NOTE — Progress Notes (Signed)
Patient ID: Dakota White, male   DOB: December 17, 1928, 82 y.o.   MRN: [id number]  PROGRESS NOTE    Dakota White  [name] DOB: [birth year] DOA: 08/07/2015  PCP: Mickie Hillier, MD   Brief Narrative:  82 y.o. male with chronic combined systolic and diastolic CHF last EF in 10/2014 35 to 40%, symptomatic bradycardia status post pacemaker placement, hypertension, chronic kidney disease, diabetes mellitus type 2, dementia who presented to [facility] status post unwitnessed fall at home.   On admission, he was hemodynamically stable. CT head showed subdural hematoma on the left side which is acute on chronic S patient had subdural hematoma last month after the fall. Patient was also found to have a right hip fracture. Ortho saw the pt in consultation. Cardiology consult requested for preoperative clearance. Neurosurgery also consulted for input on subdural hematoma.  Assessment & Plan:   Principal Problem:   Closed right hip fracture (HCC) - Appreciate orthopedic surgery recommendations - Pt is s/p right hip hemiarthroplasty  - Continue pain management efforts - PT eval once pt able to participate   Active Problems:   UTI (lower urinary tract infection) - Continue rocephin - Urine culture showed gram negative rods    Essential hypertension - Continue carvedilol    Dementia - Stable  - Continue Aricept and Memantine     Dyslipidemia associated with type 2 DM  - Continue statin therapy       SDH - Per neuro repeat CT scan today - So far, surgery not required but it needs careful monitoring     DM (diabetes mellitus), type 2, uncontrolled, 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 (HCC) - Continue Lantus 30 units at bedtime and SSI    History of permanent cardiac pacemaker placement - Stable    Chronic kidney disease, stage III (moderate) - Baseline Cr 1.53 about 9 months ago  - Cr on this admission within baseline range    Chronic combined systolic and  diastolic congestive heart failure (HCC) - Compensated  - Continue coreg     Anemia of chronic disease - Due to CKD - Hemoglobin stable     Thrombocytopenia (HCC) - Mild, continue to monitor CBC daily    Malnutrition of moderate degree - In the context of chronic illness - Nutrition consulted    DVT prophylaxis: SCD's bilaterally  Code Status: DNR/DNI Family Communication: no family at the bedside this am  Disposition Plan: to SNF or home likely next week    Consultants:   Neurosurgery  Orthopedic surgery   Procedures:   Right hip hemiarthroplasty 08/08/2015   Antimicrobials:   None    Subjective: No overnight events.   Objective: Filed Vitals:   08/09/15 0945 08/09/15 1000 08/09/15 1030 08/09/15 1404  BP: 142/62 156/75 159/64 144/75  Pulse: 83 86 87 92  Temp:  98.1 F (36.7 C) 99 F (37.2 C) 97.5 F (36.4 C)  TempSrc:    Oral  Resp: 16 17 16 16   SpO2: 98% 95% 97% 95%    Intake/Output Summary (Last 24 hours) at 08/09/15 1607 Last data filed at 08/09/15 1404  Gross per 24 hour  Intake 1831.67 ml  Output   1050 ml  Net 781.67 ml   There were no vitals filed for this visit.  Examination:  General exam: Appears calm and comfortable  Respiratory system: Clear to auscultation. Respiratory effort normal. Cardiovascular system: S1 & S2 heard, RRR.  Gastrointestinal system: Abdomen is nondistended, soft and nontender.  No organomegaly or masses felt. Normal bowel sounds heard. Central nervous system: No focal neurological deficits. Extremities: Symmetric 5 x 5 power. Skin: No rashes, lesions or ulcers Psychiatry: Mood & affect appropriate.   Data Reviewed: I have personally reviewed following labs and imaging studies  CBC:  Recent Labs Lab 08/07/15 2338 08/08/15 1906  WBC 7.5 5.8  NEUTROABS 6.0 4.5  HGB 13.2 12.3*  HCT 40.9 38.1*  MCV 92.5 94.1  PLT 148* 104*   Basic Metabolic Panel:  Recent Labs Lab 08/07/15 2338 08/08/15 1906  NA  140 136  K 3.9 4.2  CL 107 106  CO2 24 23  GLUCOSE 141* 204*  BUN 33* 29*  CREATININE 1.50* 1.64*  CALCIUM 9.3 8.7*   GFR: CrCl cannot be calculated (Unknown ideal weight.). Liver Function Tests:  Recent Labs Lab 08/08/15 1906  AST 23  ALT 17  ALKPHOS 76  BILITOT 0.7  PROT 7.2  ALBUMIN 3.6   No results for input(s): LIPASE, AMYLASE in the last 168 hours. No results for input(s): AMMONIA in the last 168 hours. Coagulation Profile: No results for input(s): INR, PROTIME in the last 168 hours. Cardiac Enzymes: No results for input(s): CKTOTAL, CKMB, CKMBINDEX, TROPONINI in the last 168 hours. BNP (last 3 results) No results for input(s): PROBNP in the last 8760 hours. HbA1C: No results for input(s): HGBA1C in the last 72 hours. CBG:  Recent Labs Lab 08/08/15 1726 08/08/15 2033 08/09/15 0031 08/09/15 0441 08/09/15 0932  GLUCAP 133* 190* 198* 202* 143*   Lipid Profile: No results for input(s): CHOL, HDL, LDLCALC, TRIG, CHOLHDL, LDLDIRECT in the last 72 hours. Thyroid Function Tests: No results for input(s): TSH, T4TOTAL, FREET4, T3FREE, THYROIDAB in the last 72 hours. Anemia Panel: No results for input(s): VITAMINB12, FOLATE, FERRITIN, TIBC, IRON, RETICCTPCT in the last 72 hours. Urine analysis:    Component Value Date/Time   COLORURINE YELLOW 08/07/2015 2251   APPEARANCEUR TURBID* 08/07/2015 2251   LABSPEC 1.016 08/07/2015 2251   PHURINE 6.0 08/07/2015 2251   GLUCOSEU NEGATIVE 08/07/2015 2251   HGBUR SMALL* 08/07/2015 2251   BILIRUBINUR NEGATIVE 08/07/2015 2251   BILIRUBINUR NEG 11/11/2014 1517   KETONESUR NEGATIVE 08/07/2015 2251   PROTEINUR 100* 08/07/2015 2251   PROTEINUR NEG 11/11/2014 1517   UROBILINOGEN 0.2 11/11/2014 1517   UROBILINOGEN 1.0 10/31/2014 2218   NITRITE POSITIVE* 08/07/2015 2251   NITRITE NEG 11/11/2014 1517   LEUKOCYTESUR LARGE* 08/07/2015 2251   Sepsis Labs: (procalcitonin:4,lacticidven:4)    Recent Results (from  the past 240 hour(s))  Urine culture     Status: Abnormal (Preliminary result)   Collection Time: 08/07/15 10:51 PM  Result Value Ref Range Status   Specimen Description URINE, CLEAN CATCH  Final   Special Requests Normal  Final   Culture >=100,000 COLONIES/mL GRAM NEGATIVE RODS (A)  Final   Report Status PENDING  Incomplete      Radiology Studies: Dg Chest 1 View 08/07/2015  : No active disease.  Dual lead pacemaker. Electronically Signed   By: Paulina Fusi M.D.   On: 08/07/2015 23:28   Ct Head Wo Contrast 08/07/2015   Mixed age subdural hematoma on the left, which was present on the study of 06/18/2015. There are mixed density components, but definitely with recent hyperdense components. Maximal thickness in the middle cranial fossa region is 10 mm. Maximal thickness in the parietal region is 6 mm. No midline shift. No skull fracture. Ordinary mild degenerative changes of the cervical spine. No traumatic finding. Critical  Value/emergent results were called by telephone at the time of interpretation on 08/07/2015 at 11:45 pm to Dr. Mancel BaleELLIOTT WENTZ , who verbally acknowledged these results. Electronically Signed   By: Paulina FusiMark  Shogry M.D.   On: 08/07/2015 23:49   Ct Cervical Spine Wo Contrast 08/07/2015  Mixed age subdural hematoma on the left, which was present on the study of 06/18/2015. There are mixed density components, but definitely with recent hyperdense components. Maximal thickness in the middle cranial fossa region is 10 mm. Maximal thickness in the parietal region is 6 mm. No midline shift. No skull fracture. Ordinary mild degenerative changes of the cervical spine. No traumatic finding. Critical Value/emergent results were called by telephone at the time of interpretation on 08/07/2015 at 11:45 pm to Dr. Mancel BaleELLIOTT WENTZ , who verbally acknowledged these results. Electronically Signed   By: Paulina FusiMark  Shogry M.D.   On: 08/07/2015 23:49   Pelvis Portable 08/09/2015  RIGHT hip prosthesis without acute  complication. Electronically Signed   By: Ulyses SouthwardMark  Boles M.D.   On: 08/09/2015 10:04   Dg Hip Unilat With Pelvis 2-3 Views Right 08/07/2015   Angulated femoral neck fracture on the right. Electronically Signed   By: Paulina FusiMark  Shogry M.D.   On: 08/07/2015 23:27      Scheduled Meds: . atorvastatin  20 mg Oral Daily  . carvedilol  3.125 mg Oral BID  . cefTRIAXone (ROCEPHIN) IVPB 1 gram/50 mL D5W  1 g Intravenous Q24H  . docusate sodium  100 mg Oral BID  . donepezil  10 mg Oral QHS  . feeding supplement (GLUCERNA SHAKE)  237 mL Oral BID BM  . finasteride  5 mg Oral Daily  . insulin aspart  0-9 Units Subcutaneous Q4H  . insulin glargine  35 Units Subcutaneous QHS  . memantine  10 mg Oral QHS  . mirtazapine  15 mg Oral QHS  . sertraline  100 mg Oral QHS   Continuous Infusions: . sodium chloride 10 mL/hr at 08/09/15 1030     LOS: 1 day    Time spent: 25 minutes  Greater than 50% of the time spent on counseling and coordinating the care.   Manson PasseyEVINE, ALMA, MD [facility] Pager [phone]  If 7PM-7AM, please contact night-coverage [email or web address] Password [email or web address] 08/09/2015, 4:07 PM

## 2015-08-10 ENCOUNTER — Inpatient Hospital Stay (HOSPITAL_COMMUNITY): Payer: Medicare Other

## 2015-08-10 DIAGNOSIS — D62 Acute posthemorrhagic anemia: Secondary | ICD-10-CM

## 2015-08-10 LAB — CBC
HCT: 26.1 % — ABNORMAL LOW (ref 39.0–52.0)
HEMATOCRIT: 25.4 % — AB (ref 39.0–52.0)
HEMOGLOBIN: 8.3 g/dL — AB (ref 13.0–17.0)
Hemoglobin: 8.3 g/dL — ABNORMAL LOW (ref 13.0–17.0)
MCH: 31.1 pg (ref 26.0–34.0)
MCH: 30.2 pg (ref 26.0–34.0)
MCHC: 32.7 g/dL (ref 30.0–36.0)
MCHC: 31.8 g/dL (ref 30.0–36.0)
MCV: 95.1 fL (ref 78.0–100.0)
MCV: 94.9 fL (ref 78.0–100.0)
PLATELETS: 73 10*3/uL — AB (ref 150–400)
Platelets: 78 10*3/uL — ABNORMAL LOW (ref 150–400)
RBC: 2.67 MIL/uL — AB (ref 4.22–5.81)
RBC: 2.75 MIL/uL — AB (ref 4.22–5.81)
RDW: 14.7 % (ref 11.5–15.5)
RDW: 14.8 % (ref 11.5–15.5)
WBC: 4.3 10*3/uL (ref 4.0–10.5)
WBC: 4.9 10*3/uL (ref 4.0–10.5)

## 2015-08-10 LAB — URINE CULTURE: Special Requests: NORMAL

## 2015-08-10 LAB — BASIC METABOLIC PANEL
Anion gap: 6 (ref 5–15)
BUN: 37 mg/dL — AB (ref 6–20)
CHLORIDE: 105 mmol/L (ref 101–111)
CO2: 23 mmol/L (ref 22–32)
Calcium: 8.1 mg/dL — ABNORMAL LOW (ref 8.9–10.3)
Creatinine, Ser: 1.86 mg/dL — ABNORMAL HIGH (ref 0.61–1.24)
GFR calc Af Amer: 36 mL/min — ABNORMAL LOW (ref >60)
GFR calc non Af Amer: 31 mL/min — ABNORMAL LOW (ref >60)
Glucose, Bld: 234 mg/dL — ABNORMAL HIGH (ref 65–99)
POTASSIUM: 4.2 mmol/L (ref 3.5–5.1)
SODIUM: 134 mmol/L — AB (ref 135–145)

## 2015-08-10 LAB — GLUCOSE, CAPILLARY
GLUCOSE-CAPILLARY: 146 mg/dL — AB (ref 65–99)
GLUCOSE-CAPILLARY: 160 mg/dL — AB (ref 65–99)
GLUCOSE-CAPILLARY: 188 mg/dL — AB (ref 65–99)
GLUCOSE-CAPILLARY: 280 mg/dL — AB (ref 65–99)
Glucose-Capillary: 263 mg/dL — ABNORMAL HIGH (ref 65–99)
Glucose-Capillary: 223 mg/dL — ABNORMAL HIGH (ref 65–99)

## 2015-08-10 NOTE — Progress Notes (Signed)
Patient has non-productive cough, daughter has concerns.  MD notified.

## 2015-08-10 NOTE — Evaluation (Signed)
Physical Therapy Evaluation Patient Details Name: Dakota White W Elk MRN: [id number] DOB: 14-Mar-1928 Today's Date: 08/10/2015   History of Present Illness    82 y.o. male with chronic combined systolic and diastolic CHF last EF in 10/2014 35 to 40%, symptomatic bradycardia status post pacemaker placement, hypertension, chronic kidney disease, diabetes mellitus type 2, dementia who [facility] status post unwitnessed fall at home.   On admission, he was hemodynamically stable. CT head showed subdural hematoma on the left side which is acute on chronic S patient had subdural hematoma last month after the fall. Patient was also found to have a right hip fracture. Underwent right hip hemiarthroplasty 7/8.  Neurosurgery states "Seems to be stable from a neurosurgical standpoint. Will need follow-up imaging at some point."   Clinical Impression  Pt presents with severe limitations to functional mobility due to pain, decr ROM, decr joint integrity, impaired cognition, currently requires 2 person assistance to transition to EOB and has not been able to stand yet.  Son (primary caregiver) and daughter agreeable to pursue SNF for postacute rehab with goal to return home with son once physically able; please ask CSW to assist with bed search.  PT will initiate rehab in acute setting and assist with d/c recommendations as pt progresses.  Recommend premedicating prior to therapy to maximize therapy efforts.  See below for details of exam findings and refer to care plan for goals of care.     Follow Up Recommendations SNF    Equipment Recommendations  None recommended by PT    Recommendations for Other Services       Precautions / Restrictions Precautions Precautions: Posterior Hip Restrictions Weight Bearing Restrictions: Yes RLE Weight Bearing: Weight bearing as tolerated      Mobility  Bed Mobility Overal bed mobility: +2 for physical assistance;Needs Assistance Bed Mobility:  Sit to Supine;Supine to Sit     Supine to sit: +2 for physical assistance;Total assist (pt = 25%) Sit to supine: +2 for physical assistance;Total assist (pt = 10%)   General bed mobility comments: pt agreeable to move but surprised by pain in hip, resistant to movement; used pad to adjust position at EOB and supported trunk until pt able to sit unassisted.  attempted scoot out to EOB but it aggravated hip and pt also c/o back pain  Transfers Overall transfer level: Needs assistance Equipment used: Rolling walker (2 wheeled) Transfers: Sit to/from Stand Sit to Stand: +2 physical assistance;Total assist         General transfer comment: pt agreeable but unable to initiate due to pain in back/hip with each initiation.  Returned to supine  Ambulation/Gait             General Gait Details: unable currently.  Stairs            Wheelchair Mobility    Modified Rankin (Stroke Patients Only)       Balance Overall balance assessment: History of Falls                                           Pertinent Vitals/Pain Pain Assessment: Faces Faces Pain Scale: Hurts whole lot Pain Location: right hip, back Pain Intervention(s): Monitored during session;Limited activity within patient's tolerance;Repositioned;Ice applied (plan to premedicate before next session)    Home Living Family/patient expects to be discharged to:: Skilled nursing facility Living Arrangements: Children;Other relatives  Additional Comments: has caregiver [phone], son and grandson stay at night.    Prior Function Level of Independence: Needs assistance   Gait / Transfers Assistance Needed: used RW in home but family suspects pt forgot to use when he fell   ADL's / Homemaking Assistance Needed: incontinent as times, wears incontinence supplies. Assist with bathing/dressing.   Comments: caregiver reports that patient needs a lot of encouragement to mobilize.      Hand Dominance        Extremity/Trunk Assessment   Upper Extremity Assessment: Defer to OT evaluation           Lower Extremity Assessment: RLE deficits/detail RLE Deficits / Details: s/p hip hemiarthroplasty, pain with ROM and does not voluntarily move    Cervical / Trunk Assessment: Normal  Communication   Communication: HOH  Cognition Arousal/Alertness: Awake/alert Behavior During Therapy: Flat affect;Agitated Overall Cognitive Status: History of cognitive impairments - at baseline (may be a little more confused per daughter)       Memory: Decreased recall of precautions;Decreased short-term memory              General Comments      Exercises        Assessment/Plan    PT Assessment Patient needs continued PT services  PT Diagnosis Acute pain;Difficulty walking   PT Problem List Pain;Decreased knowledge of precautions;Decreased safety awareness;Decreased knowledge of use of DME;Decreased cognition;Decreased mobility;Decreased activity tolerance;Decreased strength;Decreased range of motion  PT Treatment Interventions DME instruction;Gait training;Functional mobility training;Therapeutic activities;Therapeutic exercise;Patient/family education;Cognitive remediation   PT Goals (Current goals can be found in the Care Plan section) Acute Rehab PT Goals PT Goal Formulation: With family Time For Goal Achievement: 08/24/15 Potential to Achieve Goals: Fair    Frequency Min 2X/week   Barriers to discharge Decreased caregiver support pt needs to be a supervision or min assist to return home    Co-evaluation               End of Session Equipment Utilized During Treatment: Gait belt Activity Tolerance: Patient limited by pain;Treatment limited secondary to agitation Patient left: in bed;with call bell/phone within reach;with nursing/sitter in room;with bed alarm set Nurse Communication: Mobility status         Time: 1610-9604 PT Time  Calculation (min) (ACUTE ONLY): 34 min   Charges:   PT Evaluation $PT Eval Moderate Complexity: 1 Procedure PT Treatments $Therapeutic Activity: 8-22 mins   PT G Codes:        Dennis Bast 08/10/2015, 4:03 PM

## 2015-08-10 NOTE — Progress Notes (Signed)
Occupational Therapy Evaluation Patient Details Name: Dakota White MRN: [id number] DOB: 1928-12-15 Today's Date: 08/10/2015    History of Present Illness 82 y.o. male with chronic combined systolic and diastolic CHF last EF in 10/2014 35 to 40%, symptomatic bradycardia status post pacemaker placement, hypertension, chronic kidney disease, diabetes mellitus type 2, dementia who presented to [facility] s/p unwitnessed fall at home. Now s/p HIP HEMIARTHROPLASTY POSTERIOR (Right).   Clinical Impression   Pt admitted with the above diagnoses and presents with below problem list. Pt will benefit from continued acute OT to address the below listed deficits and maximize independence with BADLs prior to d/c to venue below. Bedside eval only as pt presents as needing +2 assist and +1 assist available at eval. Discussed d/c planning at length with daughter including that pt is likely needing +2 assist. Daughter is in discussion with other family members. If SNF is decided then daughter prefers pt be transferred [place]      Follow Up Recommendations  SNF;Supervision/Assistance - 24 hour    Equipment Recommendations  Other (comment) (TBD depending on venue)    Recommendations for Other Services PT consult     Precautions / Restrictions Precautions Precautions: Other (comment) (nothing specified in order, s/p HIP HEMIARTHROPLASTY POSTERI) Required Braces or Orthoses:  (KI in room but no order for wearing KI ) Restrictions Weight Bearing Restrictions: Yes Other Position/Activity Restrictions: WBAT RLE      Mobility Bed Mobility                  Transfers                      Balance                                            ADL Overall ADL's : Needs assistance/impaired Eating/Feeding: Set up;Sitting;Bed level   Grooming: Minimal assistance;Sitting;Bed level   Upper Body Bathing: Moderate assistance;Sitting;Bed level    Lower Body Bathing: Total assistance   Upper Body Dressing : Moderate assistance;Sitting;Bed level   Lower Body Dressing: Total assistance   Toilet Transfer: Maximal assistance;+2 for physical assistance;Stand-pivot;BSC;RW;Total assistance   Toileting- Clothing Manipulation and Hygiene: Maximal assistance;Total assistance;Sitting/lateral lean;Sit to/from stand;+2 for physical assistance         General ADL Comments: Bedside eval only as pt presents as needing +2 assist and +1 assist available at eval. Discussed d/c planning at length with daughter including that pt is likely needing +2 assist. Daughter is in discussion with other family members. If SNF is decided then daughter prefers pt be transferred [place] Clinical judgment used to determine current levels of assist with ADLs.       Vision     Perception     Praxis      Pertinent Vitals/Pain Pain Assessment: Faces Faces Pain Scale: Hurts little more Pain Location: RLE, resisting movement Pain Intervention(s): Limited activity within patient's tolerance;Monitored during session     Hand Dominance     Extremity/Trunk Assessment Upper Extremity Assessment Upper Extremity Assessment: Generalized weakness;Difficult to assess due to impaired cognition   Lower Extremity Assessment Lower Extremity Assessment: Defer to PT evaluation       Communication Communication Communication: HOH   Cognition Arousal/Alertness: Awake/alert;Lethargic Behavior During Therapy: Flat affect Overall Cognitive Status: History of cognitive  impairments - at baseline       Memory: Decreased recall of precautions;Decreased short-term memory             General Comments       Exercises       Shoulder Instructions      Home Living Family/patient expects to be discharged to:: Unsure Living Arrangements: Children;Other relatives                               Additional Comments: has  caregiver [phone], son and grandson stay at night.      Prior Functioning/Environment Level of Independence: Needs assistance  Gait / Transfers Assistance Needed: some ambulation with rw, believed to have left rw behind  ADL's / Homemaking Assistance Needed: incontinent as times, wears incontinence supplies. Assist with bathing/dressing.         OT Diagnosis: Generalized weakness;Acute pain;Cognitive deficits   OT Problem List: Decreased activity tolerance;Impaired balance (sitting and/or standing);Decreased cognition;Decreased knowledge of use of DME or AE;Decreased knowledge of precautions;Pain   OT Treatment/Interventions: Self-care/ADL training;DME and/or AE instruction;Therapeutic exercise;Therapeutic activities;Patient/family education;Balance training    OT Goals(Current goals can be found in the care plan section) Acute Rehab OT Goals Patient Stated Goal: unable to state OT Goal Formulation: With patient/family Time For Goal Achievement: 08/17/15 Potential to Achieve Goals: Good ADL Goals Pt Will Perform Grooming: with min assist;sitting Pt Will Perform Lower Body Bathing: with max assist;sit to/from stand;sitting/lateral leans Pt Will Perform Lower Body Dressing: with max assist;sitting/lateral leans;sit to/from stand Pt Will Transfer to Toilet: with max assist;stand pivot transfer;bedside commode Pt Will Perform Toileting - Clothing Manipulation and hygiene: with max assist;sitting/lateral leans;sit to/from stand Pt Will Perform Tub/Shower Transfer: Stand pivot transfer;with max assist;3 in 1;rolling walker  OT Frequency: Min 2X/week   Barriers to D/C:            Co-evaluation              End of Session    Activity Tolerance:   Patient left: in bed;with call bell/phone within reach;with chair alarm set;with family/visitor present   Time: 1235-1250 OT Time Calculation (min): 15 min Charges:  OT General Charges $OT Visit: 1 Procedure OT Evaluation $OT  Eval Low Complexity: 1 Procedure G-Codes:    Pilar Grammes Aug 17, 2015, 1:09 PM

## 2015-08-10 NOTE — Progress Notes (Signed)
Patient ID: Verita LambHowell W Calise, male   DOB: Mar 09, 1928, 82 y.o.   MRN: [id number] Subjective: Patient reports he is doing fine. He has no headache.  Objective: Vital signs in last 24 hours: Temp:  [97.5 F (36.4 C)-99 F (37.2 C)] 98.6 F (37 C) (07/09 0357) Pulse Rate:  [79-93] 87 (07/09 0357) Resp:  [16-20] 16 (07/08 1404) BP: (108-162)/(46-90) 115/50 mmHg (07/09 0357) SpO2:  [90 %-98 %] 97 % (07/09 0357)  Intake/Output from previous day: 07/08 0701 - 07/09 0700 In: 1066.7 [I.V.:1066.7] Out: 950 [Urine:850; Blood:100] Intake/Output this shift:    He is awake and alert and watching TV. He is picking at his breakfast. He moves all extremities. He answers simple questions. He is hard of hearing.  Lab Results: Lab Results  Component Value Date   WBC 4.3 08/10/2015   HGB 8.3* 08/10/2015   HCT 25.4* 08/10/2015   MCV 95.1 08/10/2015   PLT 78* 08/10/2015   Lab Results  Component Value Date   INR 1.04 06/18/2015   BMET Lab Results  Component Value Date   NA 134* 08/10/2015   K 4.2 08/10/2015   CL 105 08/10/2015   CO2 23 08/10/2015   GLUCOSE 234* 08/10/2015   BUN 37* 08/10/2015   CREATININE 1.86* 08/10/2015   CALCIUM 8.1* 08/10/2015    Studies/Results: Pelvis Portable  08/09/2015  CLINICAL DATA:  Hip fracture post operative repair EXAM: PORTABLE PELVIS 1-2 VIEWS COMPARISON:  Portable exam 0957 hours compared to 08/07/2015 FINDINGS: Interval resection of RIGHT femoral head/neck. RIGHT hip prosthesis now identified without fracture or dislocation. Bones demineralized. Penile prosthesis noted. Visualized pelvis unremarkable. Scattered atherosclerotic calcifications. IMPRESSION: RIGHT hip prosthesis without acute complication. Electronically Signed   By: Ulyses SouthwardMark  Boles M.D.   On: 08/09/2015 10:04    Assessment/Plan: Seems to be stable from a neurosurgical standpoint. Will need follow-up imaging at some point. Have spoken with his daughter at length.   LOS: 2 days     Aroura Vasudevan S 08/10/2015, 8:41 AM

## 2015-08-10 NOTE — Progress Notes (Signed)
Transported to CT 

## 2015-08-10 NOTE — Progress Notes (Signed)
Subjective: 1 Day Post-Op Procedure(s) (LRB): HIP HEMIARTHROPLASTY POSTERIOR (Right) Patient reports pain as mild.    Objective: Vital signs in last 24 hours: Temp:  [97.5 F (36.4 C)-99 F (37.2 C)] 98.6 F (37 C) (07/09 0357) Pulse Rate:  [83-93] 87 (07/09 0357) Resp:  [16-17] 16 (07/08 1404) BP: (108-159)/(46-90) 115/50 mmHg (07/09 0357) SpO2:  [90 %-98 %] 97 % (07/09 0357)  Intake/Output from previous day: 07/08 0701 - 07/09 0700 In: 1066.7 [I.V.:1066.7] Out: 950 [Urine:850; Blood:100] Intake/Output this shift:     Recent Labs  08/07/15 2338 08/08/15 1906 08/10/15 0637  HGB 13.2 12.3* 8.3*    Recent Labs  08/08/15 1906 08/10/15 0637  WBC 5.8 4.3  RBC 4.05* 2.67*  HCT 38.1* 25.4*  PLT 104* 78*    Recent Labs  08/08/15 1906 08/10/15 0637  NA 136 134*  K 4.2 4.2  CL 106 105  CO2 23 23  BUN 29* 37*  CREATININE 1.64* 1.86*  GLUCOSE 204* 234*  CALCIUM 8.7* 8.1*   No results for input(s): LABPT, INR in the last 72 hours.  Neurologically intact  Assessment/Plan: 1 Day Post-Op Procedure(s) (LRB): HIP HEMIARTHROPLASTY POSTERIOR (Right) Up with therapy  Dakota White C 08/10/2015, 9:34 AM

## 2015-08-10 NOTE — Progress Notes (Signed)
Patient ID: Dakota White, male   DOB: 06-03-1928, 82 y.o.   MRN: [id number]  PROGRESS NOTE    Dakota White  XBM:[id number] DOB: 06-03-1928 DOA: 08/07/2015  PCP: Mickie HillierIan McKeag, MD   Brief Narrative:  82 y.o. male with chronic combined systolic and diastolic CHF last EF in 10/2014 35 to 40%, symptomatic bradycardia status post pacemaker placement, hypertension, chronic kidney disease, diabetes mellitus type 2, dementia who presented to [facility] status post unwitnessed fall at home.   On admission, he was hemodynamically stable. CT head showed subdural hematoma on the left side which is acute on chronic S patient had subdural hematoma last month after the fall. Patient was also found to have a right hip fracture. Ortho saw the pt in consultation. Cardiology consult requested for preoperative clearance. Neurosurgery also consulted for input on subdural hematoma.  Assessment & Plan:   Principal Problem:   Closed right hip fracture (HCC) - Appreciate orthopedic surgery recommendations - Pt is s/p right hip hemiarthroplasty  - Continue pain management efforts - Will require SNF on discharge   Active Problems:   Acute blood loss anemia - Likely postoperative blood loss drop in platelets and hemoglobin but will repeat again later on today - Monitor closely due to also having SDH    UTI (lower urinary tract infection) due to Klebsiella pneumoniae - Continue rocephin - Urine culture showed Klebsiella species    Essential hypertension - Continue carvedilol    Dementia - Stable  - Continue Aricept and Memantine     Dyslipidemia associated with type 2 DM  - Continue statin therapy       SDH - Repeat CT head today, ordered yesterday but not sure why not done then  - So far, surgery not required for SDH but it needs careful monitoring     DM (diabetes mellitus), type 2, uncontrolled, 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 (HCC) - Continue Lantus 30 units at  bedtime and SSI - CBG's in past 24 hours: 280, 263, 223    History of permanent cardiac pacemaker placement - Stable    Chronic kidney disease, stage III (moderate) - Baseline Cr 1.53 about 9 months ago  - Cr on this admission within baseline range but up this am 1.8 - Will continue to monitor BMP daily     Chronic combined systolic and diastolic congestive heart failure (HCC) - Compensated  - Continue coreg  - BP 124/49    Anemia of chronic disease - Due to CKD - Hemoglobin stable     Thrombocytopenia (HCC) - Drop in platelets from 104 to 78  This am - Monitor closely especially since he has SDH - Will repeat CBC later on in a day     Malnutrition of moderate degree - In the context of chronic illness - Nutrition consulted    DVT prophylaxis: SCD's bilaterally  Code Status: DNR/DNI Family Communication: daughter at the bedside this am  Disposition Plan: to SNF once determined he is medically stable    Consultants:   Neurosurgery  Orthopedic surgery   Procedures:   Right hip hemiarthroplasty 08/08/2015   Antimicrobials:   None    Subjective: No overnight events. No headaches.  Objective: Filed Vitals:   08/09/15 2155 08/10/15 0100 08/10/15 0357 08/10/15 1034  BP: 108/90 135/46 115/50 124/49  Pulse: 93 92 87 72  Temp: 98.6 F (37 C)  98.6 F (37 C) 98.8 F (37.1 C)  TempSrc: Oral  Oral Oral  Resp:    16  SpO2: 90% 96% 97% 92%    Intake/Output Summary (Last 24 hours) at 08/10/15 1115 Last data filed at 08/10/15 0401  Gross per 24 hour  Intake  66.67 ml  Output    700 ml  Net -633.33 ml   There were no vitals filed for this visit.  Examination:  General exam: No acute distress Respiratory system: No wheezing, no rhonchi  Cardiovascular system: S1 & S2 (+), rate controlled Gastrointestinal system: (+) BS, non tender  Central nervous system: Nonfocal Extremities: palpable pulses, no edema Skin: Warm and dry Psychiatry: Normal mood and  behavior   Data Reviewed: I have personally reviewed following labs and imaging studies  CBC:  Recent Labs Lab 08/07/15 2338 08/08/15 1906 08/10/15 0637  WBC 7.5 5.8 4.3  NEUTROABS 6.0 4.5  --   HGB 13.2 12.3* 8.3*  HCT 40.9 38.1* 25.4*  MCV 92.5 94.1 95.1  PLT 148* 104* 78*   Basic Metabolic Panel:  Recent Labs Lab 08/07/15 2338 08/08/15 1906 08/10/15 0637  NA 140 136 134*  K 3.9 4.2 4.2  CL 107 106 105  CO2 GLUCOSE 141* 204* 234*  BUN 33* 29* 37*  CREATININE 1.50* 1.64* 1.86*  CALCIUM 9.3 8.7* 8.1*   GFR: CrCl cannot be calculated (Unknown ideal weight.). Liver Function Tests:  Recent Labs Lab 08/08/15 1906  AST 23  ALT 17  ALKPHOS 76  BILITOT 0.7  PROT 7.2  ALBUMIN 3.6   No results for input(s): LIPASE, AMYLASE in the last 168 hours. No results for input(s): AMMONIA in the last 168 hours. Coagulation Profile: No results for input(s): INR, PROTIME in the last 168 hours. Cardiac Enzymes: No results for input(s): CKTOTAL, CKMB, CKMBINDEX, TROPONINI in the last 168 hours. BNP (last 3 results) No results for input(s): PROBNP in the last 8760 hours. HbA1C: No results for input(s): HGBA1C in the last 72 hours. CBG:  Recent Labs Lab 08/09/15 1733 08/09/15 2204 08/10/15 0028 08/10/15 0403 08/10/15 0652  GLUCAP 276* 265* 280* 263* 223*   Lipid Profile: No results for input(s): CHOL, HDL, LDLCALC, TRIG, CHOLHDL, LDLDIRECT in the last 72 hours. Thyroid Function Tests: No results for input(s): TSH, T4TOTAL, FREET4, T3FREE, THYROIDAB in the last 72 hours. Anemia Panel: No results for input(s): VITAMINB12, FOLATE, FERRITIN, TIBC, IRON, RETICCTPCT in the last 72 hours. Urine analysis:    Component Value Date/Time   COLORURINE YELLOW 08/07/2015 2251   APPEARANCEUR TURBID* 08/07/2015 2251   LABSPEC 1.016 08/07/2015 2251   PHURINE 6.0 08/07/2015 2251   GLUCOSEU NEGATIVE 08/07/2015 2251   HGBUR SMALL* 08/07/2015 2251   BILIRUBINUR NEGATIVE  08/07/2015 2251   BILIRUBINUR NEG 11/11/2014 1517   KETONESUR NEGATIVE 08/07/2015 2251   PROTEINUR 100* 08/07/2015 2251   PROTEINUR NEG 11/11/2014 1517   UROBILINOGEN 0.2 11/11/2014 1517   UROBILINOGEN 1.0 10/31/2014 2218   NITRITE POSITIVE* 08/07/2015 2251   NITRITE NEG 11/11/2014 1517   LEUKOCYTESUR LARGE* 08/07/2015 2251   Sepsis Labs: (procalcitonin:4,lacticidven:4)    Recent Results (from the past 240 hour(s))  Urine culture     Status: Abnormal   Collection Time: 08/07/15 10:51 PM  Result Value Ref Range Status   Specimen Description URINE, CLEAN CATCH  Final   Special Requests Normal  Final   Culture >=100,000 COLONIES/mL KLEBSIELLA PNEUMONIAE (A)  Final   Report Status 08/10/2015 FINAL  Final   Organism ID, Bacteria KLEBSIELLA PNEUMONIAE (A)  Final      Susceptibility  Klebsiella pneumoniae - MIC*    AMPICILLIN >=32 RESISTANT Resistant     CEFAZOLIN <=4 SENSITIVE Sensitive     CEFTRIAXONE <=1 SENSITIVE Sensitive     CIPROFLOXACIN <=0.25 SENSITIVE Sensitive     GENTAMICIN <=1 SENSITIVE Sensitive     IMIPENEM <=0.25 SENSITIVE Sensitive     NITROFURANTOIN 64 INTERMEDIATE Intermediate     TRIMETH/SULFA <=20 SENSITIVE Sensitive     AMPICILLIN/SULBACTAM 4 SENSITIVE Sensitive     PIP/TAZO <=4 SENSITIVE Sensitive     * >=100,000 COLONIES/mL KLEBSIELLA PNEUMONIAE      Radiology Studies: Dg Chest 1 View 08/07/2015  : No active disease.  Dual lead pacemaker. Electronically Signed   By: Paulina Fusi M.D.   On: 08/07/2015 23:28   Ct Head Wo Contrast 08/07/2015   Mixed age subdural hematoma on the left, which was present on the study of 06/18/2015. There are mixed density components, but definitely with recent hyperdense components. Maximal thickness in the middle cranial fossa region is 10 mm. Maximal thickness in the parietal region is 6 mm. No midline shift. No skull fracture. Ordinary mild degenerative changes of the cervical spine. No traumatic finding. Critical  Value/emergent results were called by telephone at the time of interpretation on 08/07/2015 at 11:45 pm to Dr. Mancel Bale , who verbally acknowledged these results. Electronically Signed   By: Paulina Fusi M.D.   On: 08/07/2015 23:49   Ct Cervical Spine Wo Contrast 08/07/2015  Mixed age subdural hematoma on the left, which was present on the study of 06/18/2015. There are mixed density components, but definitely with recent hyperdense components. Maximal thickness in the middle cranial fossa region is 10 mm. Maximal thickness in the parietal region is 6 mm. No midline shift. No skull fracture. Ordinary mild degenerative changes of the cervical spine. No traumatic finding. Critical Value/emergent results were called by telephone at the time of interpretation on 08/07/2015 at 11:45 pm to Dr. Mancel Bale , who verbally acknowledged these results. Electronically Signed   By: Paulina Fusi M.D.   On: 08/07/2015 23:49   Pelvis Portable 08/09/2015  RIGHT hip prosthesis without acute complication. Electronically Signed   By: Ulyses Southward M.D.   On: 08/09/2015 10:04   Dg Hip Unilat With Pelvis 2-3 Views Right 08/07/2015   Angulated femoral neck fracture on the right. Electronically Signed   By: Paulina Fusi M.D.   On: 08/07/2015 23:27      Scheduled Meds: . atorvastatin  20 mg Oral Daily  . carvedilol  3.125 mg Oral BID  . cefTRIAXone (ROCEPHIN) IVPB 1 gram/50 mL D5W  1 g Intravenous Q24H  . docusate sodium  100 mg Oral BID  . donepezil  10 mg Oral QHS  . feeding supplement (GLUCERNA SHAKE)  237 mL Oral BID BM  . finasteride  5 mg Oral Daily  . insulin aspart  0-9 Units Subcutaneous Q4H  . insulin glargine  35 Units Subcutaneous QHS  . memantine  10 mg Oral QHS  . mirtazapine  15 mg Oral QHS  . sertraline  100 mg Oral QHS   Continuous Infusions: . sodium chloride 10 mL/hr at 08/09/15 1030     LOS: 2 days    Time spent: 25 minutes  Greater than 50% of the time spent on counseling and coordinating  the care.   Manson Passey, MD [facility] Pager [phone]  If 7PM-7AM, please contact night-coverage [email or web address] Password [email or web address] 08/10/2015, 11:15 AM

## 2015-08-11 ENCOUNTER — Encounter (HOSPITAL_COMMUNITY): Payer: Self-pay | Admitting: Orthopaedic Surgery

## 2015-08-11 LAB — BASIC METABOLIC PANEL
Anion gap: 8 (ref 5–15)
BUN: 47 mg/dL — AB (ref 6–20)
CHLORIDE: 104 mmol/L (ref 101–111)
CO2: 21 mmol/L — AB (ref 22–32)
Calcium: 8.1 mg/dL — ABNORMAL LOW (ref 8.9–10.3)
Creatinine, Ser: 1.89 mg/dL — ABNORMAL HIGH (ref 0.61–1.24)
GFR calc non Af Amer: 31 mL/min — ABNORMAL LOW (ref >60)
GFR, EST AFRICAN AMERICAN: 36 mL/min — AB (ref >60)
Glucose, Bld: 133 mg/dL — ABNORMAL HIGH (ref 65–99)
POTASSIUM: 4.2 mmol/L (ref 3.5–5.1)
SODIUM: 133 mmol/L — AB (ref 135–145)

## 2015-08-11 LAB — CBC
HEMATOCRIT: 26.1 % — AB (ref 39.0–52.0)
HEMOGLOBIN: 8.6 g/dL — AB (ref 13.0–17.0)
MCH: 30.8 pg (ref 26.0–34.0)
MCHC: 33 g/dL (ref 30.0–36.0)
MCV: 93.5 fL (ref 78.0–100.0)
Platelets: 79 10*3/uL — ABNORMAL LOW (ref 150–400)
RBC: 2.79 MIL/uL — AB (ref 4.22–5.81)
RDW: 14.6 % (ref 11.5–15.5)
WBC: 4.2 10*3/uL (ref 4.0–10.5)

## 2015-08-11 LAB — GLUCOSE, CAPILLARY
GLUCOSE-CAPILLARY: 133 mg/dL — AB (ref 65–99)
GLUCOSE-CAPILLARY: 134 mg/dL — AB (ref 65–99)
GLUCOSE-CAPILLARY: 149 mg/dL — AB (ref 65–99)
GLUCOSE-CAPILLARY: 182 mg/dL — AB (ref 65–99)
Glucose-Capillary: 111 mg/dL — ABNORMAL HIGH (ref 65–99)
Glucose-Capillary: 146 mg/dL — ABNORMAL HIGH (ref 65–99)

## 2015-08-11 MED ORDER — HYDROCODONE-ACETAMINOPHEN 5-325 MG PO TABS: 1.0000 | ORAL_TABLET | Freq: Four times a day (QID) | ORAL | Status: DC | PRN

## 2015-08-11 MED ORDER — ASPIRIN 325 MG PO TABS: 325.0000 mg | ORAL_TABLET | Freq: Every day | ORAL | Status: DC

## 2015-08-11 NOTE — NC FL2 (Signed)
[place] MEDICAID FL2 LEVEL OF CARE SCREENING TOOL     IDENTIFICATION  Patient Name: Dakota White Birthdate: 1928-09-30 Sex: male Admission Date (Current Location): 08/07/2015  [facility] [place]   Facility and Address:  [facility] [place], [address], [place], [place] [address]      Provider Number: [id number]  Attending Physician Name and Address:  Alison MurrayAlma M Devine, MD  Relative Name and Phone Number:       Current Level of Care: Hospital Recommended Level of Care: Skilled Nursing Facility Prior Approval Number:    Date Approved/Denied:   PASRR Number: [id number]  Discharge Plan: SNF    Current Diagnoses: Patient Active Problem List   Diagnosis Date Noted  . Closed right hip fracture (HCC) 08/08/2015  . Hip fracture (HCC) 08/08/2015  . Pre-operative cardiovascular examination 08/08/2015  . Fall at home-(recurrent falls) 08/08/2015  . Malnutrition of moderate degree 08/08/2015  . Subdural hematoma (HCC)   . Mixed hyperlipidemia   . Anemia of chronic disease 11/03/2014  . Thrombocytopenia (HCC) 11/03/2014  . BPH (benign prostatic hyperplasia) 11/02/2014  . General weakness 11/02/2014  . UTI (lower urinary tract infection) 11/01/2014  . Chronic combined systolic and diastolic congestive heart failure (HCC) 11/01/2014  . Chronic kidney disease, stage III (moderate) 10/21/2014  . History of permanent cardiac pacemaker placement 08/20/2013  . DM (diabetes mellitus), type 2, uncontrolled, with renal complications (HCC) 02/01/2012    Class: Chronic  . Dementia 01/30/2012    Class: Chronic  . Essential hypertension 11/17/2007    Orientation RESPIRATION BLADDER Height & Weight     Self, Time, Situation, Place  O2 Continent Weight:   Height:     BEHAVIORAL SYMPTOMS/MOOD NEUROLOGICAL BOWEL NUTRITION STATUS      Continent Diet (Please see discharge summary.)  AMBULATORY STATUS COMMUNICATION OF NEEDS Skin    (Curently unable  to ambulate.) Verbally Surgical wounds                       Personal Care Assistance Level of Assistance  Bathing, Feeding, Dressing Bathing Assistance: Maximum assistance Feeding assistance: Maximum assistance Dressing Assistance: Maximum assistance     Functional Limitations Info             SPECIAL CARE FACTORS FREQUENCY  PT (By licensed PT), OT (By licensed OT)     PT Frequency: 5 OT Frequency: 5            Contractures      Additional Factors Info  Code Status, Allergies Code Status Info: DNR Allergies Info: Actos, Ramipril           Current Medications (08/11/2015):  This is the current hospital active medication list Current Facility-Administered Medications  Medication Dose Route Frequency Provider Last Rate Last Dose  . 0.9 %  sodium chloride infusion   Intravenous Continuous Eduard ClosArshad N Kakrakandy, MD 10 mL/hr at 08/09/15 1030    . acetaminophen (TYLENOL) tablet 650 mg  650 mg Oral Q6H PRN Eldred MangesMark C Yates, MD   650 mg at 08/10/15 0104   Or  . acetaminophen (TYLENOL) suppository 650 mg  650 mg Rectal Q6H PRN Eldred MangesMark C Yates, MD      . atorvastatin (LIPITOR) tablet 20 mg  20 mg Oral Daily Eduard ClosArshad N Kakrakandy, MD   20 mg at 08/11/15 0908  . carvedilol (COREG) tablet 3.125 mg  3.125 mg Oral BID Eduard ClosArshad N Kakrakandy, MD   3.125 mg at 08/10/15  2129  . cefTRIAXone (ROCEPHIN) 1 g in dextrose 5 % 50 mL IVPB  1 g Intravenous Q24H Eduard Clos, MD   1 g at 08/10/15 2200  . docusate sodium (COLACE) capsule 100 mg  100 mg Oral BID Eldred Manges, MD   100 mg at 08/11/15 0909  . donepezil (ARICEPT) tablet 10 mg  10 mg Oral QHS Eduard Clos, MD   10 mg at 08/10/15 2129  . feeding supplement (GLUCERNA SHAKE) (GLUCERNA SHAKE) liquid 237 mL  237 mL Oral BID BM Alison Murray, MD   237 mL at 08/11/15 1205  . finasteride (PROSCAR) tablet 5 mg  5 mg Oral Daily Eduard Clos, MD   5 mg at 08/11/15 0909  . HYDROcodone-acetaminophen (NORCO/VICODIN) 5-325 MG per  tablet 1-2 tablet  1-2 tablet Oral Q6H PRN Eldred Manges, MD   1 tablet at 08/11/15 0910  . insulin aspart (novoLOG) injection 0-9 Units  0-9 Units Subcutaneous Q4H Eduard Clos, MD   2 Units at 08/11/15 1206  . insulin glargine (LANTUS) injection 35 Units  35 Units Subcutaneous QHS Eduard Clos, MD   35 Units at 08/10/15 2129  . memantine (NAMENDA) tablet 10 mg  10 mg Oral QHS Eduard Clos, MD   10 mg at 08/10/15 2129  . menthol-cetylpyridinium (CEPACOL) lozenge 3 mg  1 lozenge Oral PRN Eldred Manges, MD       Or  . phenol (CHLORASEPTIC) mouth spray 1 spray  1 spray Mouth/Throat PRN Eldred Manges, MD      . metoCLOPramide (REGLAN) tablet 5-10 mg  5-10 mg Oral Q8H PRN Eldred Manges, MD       Or  . metoCLOPramide (REGLAN) injection 5-10 mg  5-10 mg Intravenous Q8H PRN Eldred Manges, MD      . mirtazapine (REMERON) tablet 15 mg  15 mg Oral QHS Eduard Clos, MD   15 mg at 08/10/15 2129  . morphine 2 MG/ML injection 0.5 mg  0.5 mg Intravenous Q2H PRN Eldred Manges, MD   0.5 mg at 08/10/15 0104  . ondansetron (ZOFRAN) tablet 4 mg  4 mg Oral Q6H PRN Eldred Manges, MD       Or  . ondansetron [facility]) injection 4 mg  4 mg Intravenous Q6H PRN Eldred Manges, MD      . oxyCODONE (Oxy IR/ROXICODONE) immediate release tablet 5-10 mg  5-10 mg Oral Q4H PRN Eldred Manges, MD   10 mg at 08/10/15 0104  . polyethylene glycol (MIRALAX / GLYCOLAX) packet 17 g  17 g Oral Daily PRN Eldred Manges, MD      . sertraline (ZOLOFT) tablet 100 mg  100 mg Oral QHS Eduard Clos, MD   100 mg at 08/10/15 2129     Discharge Medications: Please see discharge summary for a list of discharge medications.  Relevant Imaging Results:  Relevant Lab Results:   Additional Information SSN: [id number]  Rod Mae, LCSW

## 2015-08-11 NOTE — Progress Notes (Signed)
Patient ID: Dakota White W Maiello, male   DOB: Apr 28, 1928, 82 y.o.   MRN: [id number] Confused but able to communicate. Ct head stable. Spoke with family member. No need for surgery. Will f/u. Sta[ples in place. Need to be removed in 1 week

## 2015-08-11 NOTE — Progress Notes (Signed)
   The patient is lying in bed with head elevated. Daughter is at the bedside. She is happy that he is finally eating breakfast for the first time since admission.  The patient has dementia and has no particular complaints.  There've been no significant cardiac complications.  We will sign off and please give us a call if we may be of further assistance.

## 2015-08-11 NOTE — Care Management Important Message (Signed)
Important Message  Patient Details  Name: Verita LambHowell W Herbel MRN: [id number] Date of Birth: 1928-02-27   Medicare Important Message Given:  Yes    Bernadette HoitShoffner, Koralynn Greenspan Coleman 08/11/2015, 9:41 AM

## 2015-08-11 NOTE — Progress Notes (Addendum)
Patient ID: Dakota White, male   DOB: [birth year], 82 y.o.   MRN: [id number]  PROGRESS NOTE    VINT POLA  [name] DOB: 1928/07/12 DOA: 08/07/2015  PCP: Mickie Hillier, MD   Brief Narrative:  82 y.o. male with chronic combined systolic and diastolic CHF last EF in 10/2014 35 to 40%, symptomatic bradycardia status post pacemaker placement, hypertension, chronic kidney disease, diabetes mellitus type 2, dementia who presented to [facility] status post unwitnessed fall at home.   On admission, he was hemodynamically stable. CT head showed subdural hematoma on the left side which is acute on chronic S patient had subdural hematoma last month after the fall. Patient was also found to have a right hip fracture. Ortho saw the pt in consultation. Cardiology consult requested for preoperative clearance. Neurosurgery also consulted for input on subdural hematoma.  Assessment & Plan:   Principal Problem:   Closed right hip fracture (HCC) - Appreciate orthopedic surgery recommendations - Pt is s/p right hip hemiarthroplasty  - Will require SNF on discharge  - Appreciate SW assistance with discharge planning   Active Problems:   Acute blood loss anemia - Likely postoperative blood loss drop in platelets and hemoglobin but in past 48 hours hgb stable 8.6. 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 Overall stable in past 48 hours     Chronic combined systolic and diastolic congestive heart failure (HCC) - Compensated  - Continue coreg     Anemia of chronic disease - Due to CKD - Hemoglobin stable at 8.6 - Monitor daily CBC as pt has SDH     Thrombocytopenia (HCC) - Drop in platelets from 104 to 78, 73, 79. Remains stable in 70's range  - Monitor closely especially since he has SDH    Malnutrition of moderate degree - In the context of chronic illness - Nutrition consulted    DVT prophylaxis: SCD's bilaterally  Code Status: DNR/DNI Family Communication: daughter at the bedside this am  Disposition Plan: to SNF once determined he is medically stable    Consultants:   Neurosurgery  Orthopedic surgery   Procedures:   Right hip hemiarthroplasty 08/08/2015   Antimicrobials:   None    Subjective: No overnight events.    Objective: Filed Vitals:   08/10/15 2040 08/11/15 0000 08/11/15 0347 08/11/15 0858  BP: 115/61 123/80 118/53 122/52  Pulse: 75 74 72 70  Temp: 98.8 F (37.1 C) 98.6 F (37 C) 98.4 F (36.9 C)   TempSrc: Oral Oral Oral   Resp:  SpO2: 90% 97% 93%     Intake/Output Summary (Last 24 hours) at 08/11/15 1008 Last data filed at 08/11/15 0900  Gross per 24 hour  Intake    720 ml  Output    650 ml  Net     70 ml   There were no vitals filed for this visit.  Examination:  General exam: looks better, eating by himself  Respiratory system: bilateral air entry, no wheezing Cardiovascular system: S1 & S2 (+), RRR Gastrointestinal system: (+) BS, no distention  Central nervous system: No focal deficit  Extremities: palpable pulses, no swelling  Skin: no lesions or ulcers    Psychiatry: Normal mood, no agitation or restlessness   Data Reviewed: I have personally reviewed following labs and imaging studies  CBC:  Recent Labs Lab 08/07/15 2338 08/08/15 1906 08/10/15 0637 08/10/15 1300 08/11/15 0252  WBC 7.5 5.8 4.3 4.9 4.2  NEUTROABS 6.0 4.5  --   --   --   HGB 13.2 12.3* 8.3* 8.3* 8.6*  HCT 40.9 38.1* 25.4* 26.1* 26.1*  MCV 92.5 94.1 95.1 94.9 93.5  PLT 148* 104* 78* 73* 79*   Basic Metabolic Panel:  Recent Labs Lab 08/07/15 2338 08/08/15 1906 08/10/15 0637 08/11/15 0252  NA 140 136 134* 133*  K 3.9 4.2 4.2 4.2  CL 107 106 105 104  CO2 24 23 23  21*  GLUCOSE 141* 204* 234* 133*  BUN 33* 29* 37* 47*  CREATININE 1.50* 1.64* 1.86* 1.89*  CALCIUM 9.3 8.7* 8.1* 8.1*   GFR: CrCl cannot be calculated (Unknown ideal weight.). Liver Function Tests:  Recent Labs Lab 08/08/15 1906  AST 23  ALT 17  ALKPHOS 76  BILITOT 0.7  PROT 7.2  ALBUMIN 3.6   No results for input(s): LIPASE, AMYLASE in the last 168 hours. No results for input(s): AMMONIA in the last 168 hours. Coagulation Profile: No results for input(s): INR, PROTIME in the last 168 hours. Cardiac Enzymes: No results for input(s): CKTOTAL, CKMB, CKMBINDEX, TROPONINI in the last 168 hours. BNP (last 3 results) No results for input(s): PROBNP in the last 8760 hours. HbA1C: No results for input(s): HGBA1C in the last 72 hours. CBG:  Recent Labs Lab 08/10/15 1634 08/10/15 2045 08/11/15 0006 08/11/15 0351 08/11/15 0742  GLUCAP 160* 146* 134* 149* 111*   Lipid Profile: No results for input(s): CHOL, HDL, LDLCALC, TRIG, CHOLHDL, LDLDIRECT in the last 72 hours. Thyroid Function Tests: No results for input(s): TSH, T4TOTAL, FREET4, T3FREE, THYROIDAB in the last 72 hours. Anemia Panel: No results for input(s): VITAMINB12, FOLATE, FERRITIN, TIBC, IRON, RETICCTPCT in the last 72 hours. Urine analysis:    Component Value Date/Time   COLORURINE YELLOW 08/07/2015 2251    APPEARANCEUR TURBID* 08/07/2015 2251   LABSPEC 1.016 08/07/2015 2251   PHURINE 6.0 08/07/2015 2251   GLUCOSEU NEGATIVE 08/07/2015 2251   HGBUR SMALL* 08/07/2015 2251   BILIRUBINUR NEGATIVE 08/07/2015 2251   BILIRUBINUR NEG 11/11/2014 1517   KETONESUR NEGATIVE 08/07/2015 2251   PROTEINUR 100* 08/07/2015 2251   PROTEINUR NEG 11/11/2014 1517   UROBILINOGEN 0.2 11/11/2014 1517   UROBILINOGEN 1.0 10/31/2014 2218   NITRITE POSITIVE* 08/07/2015 2251   NITRITE NEG 11/11/2014 1517   LEUKOCYTESUR LARGE* 08/07/2015 2251   Sepsis Labs: @LABRCNTIP (procalcitonin:4,lacticidven:4)    Recent Results (from the past 240 hour(s))  Urine culture     Status: Abnormal   Collection Time: 08/07/15 10:51 PM  Result Value Ref Range Status   Specimen Description URINE, CLEAN CATCH  Final  Special Requests Normal  Final   Culture >=100,000 COLONIES/mL KLEBSIELLA PNEUMONIAE (A)  Final   Report Status 08/10/2015 FINAL  Final   Organism ID, Bacteria KLEBSIELLA PNEUMONIAE (A)  Final      Susceptibility   Klebsiella pneumoniae - MIC*    AMPICILLIN >=32 RESISTANT Resistant     CEFAZOLIN <=4 SENSITIVE Sensitive     CEFTRIAXONE <=1 SENSITIVE Sensitive     CIPROFLOXACIN <=0.25 SENSITIVE Sensitive     GENTAMICIN <=1 SENSITIVE Sensitive     IMIPENEM <=0.25 SENSITIVE Sensitive     NITROFURANTOIN 64 INTERMEDIATE Intermediate     TRIMETH/SULFA <=20 SENSITIVE Sensitive     AMPICILLIN/SULBACTAM 4 SENSITIVE Sensitive     PIP/TAZO <=4 SENSITIVE Sensitive     * >=100,000 COLONIES/mL KLEBSIELLA PNEUMONIAE      Radiology Studies:   Ct Head Wo Contrast 08/10/2015   1. Overall stable size of small to moderate mixed density left cerebral convexity subdural hematoma, with superior redistribution of the collection as described. Stable minimal 2 mm right midline shift. 2. Generalized cerebral volume loss and mild chronic small vessel ischemia. 3. Stable small right mastoid effusion.   Dg Chest 1 View 08/07/2015  : No  active disease.  Dual lead pacemaker. Electronically Signed   By: Paulina FusiMark  Shogry M.D.   On: 08/07/2015 23:28   Ct Head Wo Contrast 08/07/2015   Mixed age subdural hematoma on the left, which was present on the study of 06/18/2015. There are mixed density components, but definitely with recent hyperdense components. Maximal thickness in the middle cranial fossa region is 10 mm. Maximal thickness in the parietal region is 6 mm. No midline shift. No skull fracture. Ordinary mild degenerative changes of the cervical spine. No traumatic finding. Critical Value/emergent results were called by telephone at the time of interpretation on 08/07/2015 at 11:45 pm to Dr. Mancel BaleELLIOTT WENTZ , who verbally acknowledged these results. Electronically Signed   By: Paulina FusiMark  Shogry M.D.   On: 08/07/2015 23:49   Ct Cervical Spine Wo Contrast 08/07/2015  Mixed age subdural hematoma on the left, which was present on the study of 06/18/2015. There are mixed density components, but definitely with recent hyperdense components. Maximal thickness in the middle cranial fossa region is 10 mm. Maximal thickness in the parietal region is 6 mm. No midline shift. No skull fracture. Ordinary mild degenerative changes of the cervical spine. No traumatic finding. Critical Value/emergent results were called by telephone at the time of interpretation on 08/07/2015 at 11:45 pm to Dr. Mancel BaleELLIOTT WENTZ , who verbally acknowledged these results. Electronically Signed   By: Paulina FusiMark  Shogry M.D.   On: 08/07/2015 23:49   Pelvis Portable 08/09/2015  RIGHT hip prosthesis without acute complication. Electronically Signed   By: Ulyses SouthwardMark  Boles M.D.   On: 08/09/2015 10:04   Dg Hip Unilat With Pelvis 2-3 Views Right 08/07/2015   Angulated femoral neck fracture on the right. Electronically Signed   By: Paulina FusiMark  Shogry M.D.   On: 08/07/2015 23:27      Scheduled Meds: . atorvastatin  20 mg Oral Daily  . carvedilol  3.125 mg Oral BID  . cefTRIAXone (ROCEPHIN) IVPB 1 gram/50 mL D5W  1  g Intravenous Q24H  . docusate sodium  100 mg Oral BID  . donepezil  10 mg Oral QHS  . feeding supplement (GLUCERNA SHAKE)  237 mL Oral BID BM  . finasteride  5 mg Oral Daily  . insulin aspart  0-9 Units Subcutaneous Q4H  . insulin glargine  35 Units Subcutaneous QHS  . memantine  10 mg Oral QHS  . mirtazapine  15 mg Oral QHS  . sertraline  100 mg Oral QHS   Continuous Infusions: . sodium chloride 10 mL/hr at 08/09/15 1030     LOS: 3 days    Time spent: 25 minutes  Greater than 50% of the time spent on counseling and coordinating the care.   Manson Passey, MD [facility] Pager [phone]  If 7PM-7AM, please contact night-coverage [email or web address] Password [email or web address] 08/11/2015, 10:08 AM

## 2015-08-11 NOTE — Clinical Social Work Note (Signed)
Clinical Social Work Assessment  Patient Details  Name: Dakota White MRN: [id number] Date of Birth: April 04, 1928  Date of referral:  08/11/15               Reason for consult:  Facility Placement, Discharge Planning                Permission sought to share information with:  Family Supports Permission granted to share information::  Yes, Verbal Permission Granted  Name::     Dakota White  [facility]  Relationship::  Daughter  Contact Information:     Housing/Transportation Living arrangements for the past 2 months:  Single Family Home Source of Information:  Adult Children Patient Interpreter Needed:  None Criminal Activity/Legal Involvement Pertinent to Current Situation/Hospitalization:  No - Comment as needed Significant Relationships:  Adult Children Lives with:  Adult Children Do you feel safe going back to the place where you live?  Yes Need for family participation in patient care:  Yes (Comment) (Patient'White children active in patient'White care.)  Care giving concerns:  Patient'White daughter expressed no concerns at this time.   Social Worker assessment / plan:  LCSW received referral for possible SNF placement at time of discharge. LCSW spoke with patient'White daughter regarding discharge plan. Per patient'White daughter, family agreeable to SNF placement at time of discharge and currently do not have a preference for SNF facility. LCSW to continue to follow and assist with discharge planning needs.  Employment status:  Retired Health and safety inspectornsurance information:  Medicare PT Recommendations:  Skilled Nursing Facility Information / Referral to community resources:  Skilled Nursing Facility  Patient/Family'White Response to care:  Patient'White daughter understanding and agreeable to [employer] JohnsonLCSW plan of care.  Patient/Family'White Understanding of and Emotional Response to Diagnosis, Current Treatment, and Prognosis:  Patient'White daughter understanding and agreeable to LCSW plan of care.  Emotional  Assessment Appearance:  Appears stated age Attitude/Demeanor/Rapport:  Other (Appropriate.) Affect (typically observed):  Accepting, Appropriate, Pleasant Orientation:  Oriented to Self Alcohol / Substance use:  Not Applicable Psych involvement (Current and /or in the community):  No (Comment) (Not appropriate on this admission.)  Discharge Needs  Concerns to be addressed:  No discharge needs identified Readmission within the last 30 days:  No Current discharge risk:  None Barriers to Discharge:  No Barriers Identified   [name], Dakota Cogswell S, LCSW 08/11/2015, 3:07 PM [phone]

## 2015-08-11 NOTE — Progress Notes (Signed)
Subjective: No hip complaints.  Objective: Vital signs in last 24 hours: Temp:  [98.4 F (36.9 C)-98.9 F (37.2 C)] 98.9 F (37.2 C) (07/10 1343) Pulse Rate:  [70-75] 70 (07/10 1343) Resp:  [16] 16 (07/10 1343) BP: (115-126)/(47-80) 126/47 mmHg (07/10 1343) SpO2:  [90 %-97 %] 95 % (07/10 1343)  Intake/Output from previous day: 07/09 0701 - 07/10 0700 In: 720 [P.O.:720] Out: 550 [Urine:550] Intake/Output this shift: Total I/O In: 240 [P.O.:240] Out: 100 [Urine:100]   Recent Labs  08/08/15 1906 08/10/15 0637 08/10/15 1300 08/11/15 0252  HGB 12.3* 8.3* 8.3* 8.6*    Recent Labs  08/10/15 1300 08/11/15 0252  WBC 4.9 4.2  RBC 2.75* 2.79*  HCT 26.1* 26.1*  PLT 73* 79*    Recent Labs  08/10/15 0637 08/11/15 0252  NA 134* 133*  K 4.2 4.2  CL 105 104  CO2 23 21*  BUN 37* 47*  CREATININE 1.86* 1.89*  GLUCOSE 234* 133*  CALCIUM 8.1* 8.1*   No results for input(s): LABPT, INR in the last 72 hours.  Exam:  Wound looks good.  Staples intact.  No drainage or signs of infection.  Calf nontender,   Assessment/Plan: Continue present care.  Will need SNF placement.  Stable from ortho standpoint.     OWENS,JAMES M 08/11/2015, 4:06 PM

## 2015-08-12 ENCOUNTER — Inpatient Hospital Stay (HOSPITAL_COMMUNITY): Payer: Medicare Other

## 2015-08-12 LAB — CBC
HEMATOCRIT: 27.1 % — AB (ref 39.0–52.0)
HEMOGLOBIN: 8.5 g/dL — AB (ref 13.0–17.0)
MCH: 29.3 pg (ref 26.0–34.0)
MCHC: 31.4 g/dL (ref 30.0–36.0)
MCV: 93.4 fL (ref 78.0–100.0)
Platelets: 106 10*3/uL — ABNORMAL LOW (ref 150–400)
RBC: 2.9 MIL/uL — ABNORMAL LOW (ref 4.22–5.81)
RDW: 14.4 % (ref 11.5–15.5)
WBC: 4.4 10*3/uL (ref 4.0–10.5)

## 2015-08-12 LAB — GLUCOSE, CAPILLARY
GLUCOSE-CAPILLARY: 85 mg/dL (ref 65–99)
GLUCOSE-CAPILLARY: 218 mg/dL — AB (ref 65–99)
GLUCOSE-CAPILLARY: 151 mg/dL — AB (ref 65–99)
GLUCOSE-CAPILLARY: 189 mg/dL — AB (ref 65–99)
Glucose-Capillary: 138 mg/dL — ABNORMAL HIGH (ref 65–99)
Glucose-Capillary: 169 mg/dL — ABNORMAL HIGH (ref 65–99)

## 2015-08-12 MED ORDER — GUAIFENESIN-DM 100-10 MG/5ML PO SYRP
5.0000 mL | ORAL_SOLUTION | ORAL | Status: DC | PRN
  Administered 2015-08-12: 5 mL via ORAL
  Filled 2015-08-12: qty 5

## 2015-08-12 MED ORDER — DEXTROSE 5 % IV SOLN
500.0000 mg | INTRAVENOUS | Status: DC
  Administered 2015-08-12 – 2015-08-14 (×3): 500 mg via INTRAVENOUS
  Filled 2015-08-12 (×3): qty 500

## 2015-08-12 NOTE — Progress Notes (Signed)
Patient ID: Dakota White W Montemurro, male   DOB: 09-30-1928, [age] y.o.   MRN: [id number] Neuro stable.  Continue as per ortho

## 2015-08-12 NOTE — Progress Notes (Signed)
   Mr. Fodge is stable from the cardiac standpoint. There is no evidence of heart failure, arrhythmia, or angina.  Plan to sign off. Please call if further questions.

## 2015-08-12 NOTE — Plan of Care (Signed)
Problem: Skin Integrity: Goal: Risk for impaired skin integrity will decrease Outcome: Not Progressing Pt is noncompliant with q2hr turns.

## 2015-08-12 NOTE — Progress Notes (Signed)
Patient ID: Dakota White, male   DOB: Jan 14, 1929, 82 y.o.   MRN: [id number]  PROGRESS NOTE    Dakota White  WUJ:[id number] DOB: 1928-03-30 DOA: 08/07/2015  PCP: Mickie Hillier, MD   Brief Narrative:  82 y.o. male with chronic combined systolic and diastolic CHF last EF in 10/2014 35 to 40%, symptomatic bradycardia status post pacemaker placement, hypertension, chronic kidney disease, diabetes mellitus type 2, dementia who presented to [facility] status post unwitnessed fall at home.   On admission, he was hemodynamically stable. CT head showed subdural hematoma on the left side which is acute on chronic S patient had subdural hematoma last month after the fall. Patient was also found to have a right hip fracture. Ortho saw the pt in consultation. Cardiology consult requested for preoperative clearance. Neurosurgery also consulted for input on subdural hematoma.  Assessment & Plan:   Principal Problem:   Closed right hip fracture (HCC) - Appreciate orthopedic surgery recommendations - Pt is s/p right hip hemiarthroplasty  - Skilled nursing facility placement on discharge  Active Problems:   Coughing - Possible acute bronchitis - Chest x-ray obtained shows moderate changes of acute bronchitis and/or asthma but no focal airspace pneumonia - We'll start azithromycin today    Acute blood loss anemia - Patient had drop in hemoglobin and platelets during this hospital stay. Hemoglobin dropped from 12-8 and platelets from 100 range to 70s. - Fortunately hemoglobin remains stable in 8.5 range and platelets are 106 this morning - We will check CBC tomorrow morning.    UTI (lower urinary tract infection) due to Klebsiella pneumoniae - Urine culture showed Klebsiella species - Continue rocephin through 08/13/2015 which will complete a total of 5 days of IV Rocephin.    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 Overall stable in past 48 hours.    Chronic combined systolic and diastolic congestive heart failure (HCC) - Compensated  - Continue coreg     Anemia of chronic disease - Due to CKD - Hemoglobin stable at 8.5    Thrombocytopenia (HCC) - Drop in platelets from 104 down to as low as 73 - Fortunately platelet count subsequently improving and it is 106 this morning    Malnutrition of moderate degree - In the context of chronic illness - Nutrition consulted    DVT prophylaxis: SCD's bilaterally  Code Status: DNR/DNI Family Communication: daughter at the bedside this am  Disposition Plan: to SNF likely in next 24 hours.   Consultants:   Neurosurgery  Orthopedic surgery   Procedures:   Right hip hemiarthroplasty 08/08/2015   Antimicrobials:   Rocephin 08/08/2015 -->  Azithromycin 08/12/2015 -->    Subjective: Reports more coughing overnight.   Objective: Filed Vitals:   08/11/15 0858 08/11/15  1343 08/11/15 2029 08/12/15 0435  BP: 122/52 126/47 156/60 145/57  Pulse: 70 70 85 80  Temp:  98.9 F (37.2 C) 99.2 F (37.3 C) 99.3 F (37.4 C)  TempSrc:  Oral Oral Oral  Resp:  SpO2:  95% 95% 93%    Intake/Output Summary (Last 24 hours) at 08/12/15 1050 Last data filed at 08/12/15 0434  Gross per 24 hour  Intake    480 ml  Output    600 ml  Net   -120 ml   There were no vitals filed  for this visit.  Examination:  General exam: No acute distress Respiratory system: bilateral air entry, no wheezing, no rhonchi Cardiovascular system: S1 & S2 (+), rate controlled Gastrointestinal system: (+) BS, nontender, nondistended Central nervous system: Nonfocal Extremities: No swelling, pulses palpable Skin: no lesions or ulcers  Psychiatry: Normal mood, behavior  Data Reviewed: I have personally reviewed following labs and imaging studies  CBC:  Recent Labs Lab 08/07/15 2338 08/08/15 1906 08/10/15 0637 08/10/15 1300 08/11/15 0252 08/12/15 0234  WBC 7.5 5.8 4.3 4.9 4.2 4.4  NEUTROABS 6.0 4.5  --   --   --   --   HGB 13.2 12.3* 8.3* 8.3* 8.6* 8.5*  HCT 40.9 38.1* 25.4* 26.1* 26.1* 27.1*  MCV 92.5 94.1 95.1 94.9 93.5 93.4  PLT 148* 104* 78* 73* 79* 106*   Basic Metabolic Panel:  Recent Labs Lab 08/07/15 2338 08/08/15 1906 08/10/15 0637 08/11/15 0252  NA 140 136 134* 133*  K 3.9 4.2 4.2 4.2  CL 107 106 105 104  CO2 21*  GLUCOSE 141* 204* 234* 133*  BUN 33* 29* 37* 47*  CREATININE 1.50* 1.64* 1.86* 1.89*  CALCIUM 9.3 8.7* 8.1* 8.1*   GFR: CrCl cannot be calculated (Unknown ideal weight.). Liver Function Tests:  Recent Labs Lab 08/08/15 1906  AST 23  ALT 17  ALKPHOS 76  BILITOT 0.7  PROT 7.2  ALBUMIN 3.6   No results for input(s): LIPASE, AMYLASE in the last 168 hours. No results for input(s): AMMONIA in the last 168 hours. Coagulation Profile: No results for input(s): INR, PROTIME in the last 168 hours. Cardiac Enzymes: No results for input(s): CKTOTAL, CKMB, CKMBINDEX, TROPONINI in the last 168 hours. BNP (last 3 results) No results for input(s): PROBNP in the last 8760 hours. HbA1C: No results for input(s): HGBA1C in the last 72 hours. CBG:  Recent Labs Lab 08/11/15 1739 08/11/15 2120 08/12/15 0037 08/12/15 0405 08/12/15 0757  GLUCAP 133* 146* 138* 85 169*   Lipid Profile: No results for input(s): CHOL, HDL, LDLCALC,  TRIG, CHOLHDL, LDLDIRECT in the last 72 hours. Thyroid Function Tests: No results for input(s): TSH, T4TOTAL, FREET4, T3FREE, THYROIDAB in the last 72 hours. Anemia Panel: No results for input(s): VITAMINB12, FOLATE, FERRITIN, TIBC, IRON, RETICCTPCT in the last 72 hours. Urine analysis:    Component Value Date/Time   COLORURINE YELLOW 08/07/2015 2251   APPEARANCEUR TURBID* 08/07/2015 2251   LABSPEC 1.016 08/07/2015 2251   PHURINE 6.0 08/07/2015 2251   GLUCOSEU NEGATIVE 08/07/2015 2251   HGBUR SMALL* 08/07/2015 2251   BILIRUBINUR NEGATIVE 08/07/2015 2251   BILIRUBINUR NEG 11/11/2014 1517   KETONESUR NEGATIVE 08/07/2015 2251   PROTEINUR 100* 08/07/2015 2251   PROTEINUR NEG 11/11/2014 1517   UROBILINOGEN 0.2 11/11/2014 1517   UROBILINOGEN 1.0 10/31/2014 2218   NITRITE POSITIVE* 08/07/2015 2251   NITRITE NEG 11/11/2014 1517   LEUKOCYTESUR LARGE* 08/07/2015 2251   Sepsis  Labs: @LABRCNTIP (procalcitonin:4,lacticidven:4)    Recent Results (from the past 240 hour(s))  Urine culture     Status: Abnormal   Collection Time: 08/07/15 10:51 PM  Result Value Ref Range Status   Specimen Description URINE, CLEAN CATCH  Final   Special Requests Normal  Final   Culture >=100,000 COLONIES/mL KLEBSIELLA PNEUMONIAE (A)  Final   Report Status 08/10/2015 FINAL  Final   Organism ID, Bacteria KLEBSIELLA PNEUMONIAE (A)  Final      Susceptibility   Klebsiella pneumoniae - MIC*    AMPICILLIN >=32 RESISTANT Resistant     CEFAZOLIN <=4 SENSITIVE Sensitive     CEFTRIAXONE <=1 SENSITIVE Sensitive     CIPROFLOXACIN <=0.25 SENSITIVE Sensitive     GENTAMICIN <=1 SENSITIVE Sensitive     IMIPENEM <=0.25 SENSITIVE Sensitive     NITROFURANTOIN 64 INTERMEDIATE Intermediate     TRIMETH/SULFA <=20 SENSITIVE Sensitive     AMPICILLIN/SULBACTAM 4 SENSITIVE Sensitive     PIP/TAZO <=4 SENSITIVE Sensitive     * >=100,000 COLONIES/mL KLEBSIELLA PNEUMONIAE      Radiology Studies:  Dg Chest Port 1  View 08/12/2015   Suboptimal inspiration. Moderate changes of acute bronchitis and/or asthma without focal airspace pneumonia. Electronically Signed   By: Hulan Saashomas  Lawrence M.D.   On: 08/12/2015 10:08    Ct Head Wo Contrast 08/10/2015   1. Overall stable size of small to moderate mixed density left cerebral convexity subdural hematoma, with superior redistribution of the collection as described. Stable minimal 2 mm right midline shift. 2. Generalized cerebral volume loss and mild chronic small vessel ischemia. 3. Stable small right mastoid effusion.   Dg Chest 1 View 08/07/2015  : No active disease.  Dual lead pacemaker. Electronically Signed   By: Paulina FusiMark  Shogry M.D.   On: 08/07/2015 23:28   Ct Head Wo Contrast 08/07/2015   Mixed age subdural hematoma on the left, which was present on the study of 06/18/2015. There are mixed density components, but definitely with recent hyperdense components. Maximal thickness in the middle cranial fossa region is 10 mm. Maximal thickness in the parietal region is 6 mm. No midline shift. No skull fracture. Ordinary mild degenerative changes of the cervical spine. No traumatic finding. Critical Value/emergent results were called by telephone at the time of interpretation on 08/07/2015 at 11:45 pm to Dr. Mancel BaleELLIOTT WENTZ , who verbally acknowledged these results. Electronically Signed   By: Paulina FusiMark  Shogry M.D.   On: 08/07/2015 23:49   Ct Cervical Spine Wo Contrast 08/07/2015  Mixed age subdural hematoma on the left, which was present on the study of 06/18/2015. There are mixed density components, but definitely with recent hyperdense components. Maximal thickness in the middle cranial fossa region is 10 mm. Maximal thickness in the parietal region is 6 mm. No midline shift. No skull fracture. Ordinary mild degenerative changes of the cervical spine. No traumatic finding. Critical Value/emergent results were called by telephone at the time of interpretation on 08/07/2015 at 11:45 pm to Dr.  Mancel BaleELLIOTT WENTZ , who verbally acknowledged these results. Electronically Signed   By: Paulina FusiMark  Shogry M.D.   On: 08/07/2015 23:49   Pelvis Portable 08/09/2015  RIGHT hip prosthesis without acute complication. Electronically Signed   By: Ulyses SouthwardMark  Boles M.D.   On: 08/09/2015 10:04   Dg Hip Unilat With Pelvis 2-3 Views Right 08/07/2015   Angulated femoral neck fracture on the right. Electronically Signed   By: Paulina FusiMark  Shogry M.D.   On: 08/07/2015 23:27  Scheduled Meds: . atorvastatin  20 mg Oral Daily  . azithromycin  500 mg Intravenous Q24H  . carvedilol  3.125 mg Oral BID  . cefTRIAXone (ROCEPHIN) IVPB 1 gram/50 mL D5W  1 g Intravenous Q24H  . docusate sodium  100 mg Oral BID  . donepezil  10 mg Oral QHS  . feeding supplement (GLUCERNA SHAKE)  237 mL Oral BID BM  . finasteride  5 mg Oral Daily  . insulin aspart  0-9 Units Subcutaneous Q4H  . insulin glargine  35 Units Subcutaneous QHS  . memantine  10 mg Oral QHS  . mirtazapine  15 mg Oral QHS  . sertraline  100 mg Oral QHS   Continuous Infusions: . sodium chloride 10 mL/hr at 08/09/15 1030     LOS: 4 days    Time spent: 25 minutes  Greater than 50% of the time spent on counseling and coordinating the care.   Manson Passey, MD [facility] Pager [phone]  If 7PM-7AM, please contact night-coverage [email or web address] Password [email or web address] 08/12/2015, 10:50 AM

## 2015-08-12 NOTE — Progress Notes (Signed)
Physical Therapy Treatment Patient Details Name: Dakota White MRN: [id number] DOB: Apr 13, 1928 Today's Date: 08/12/2015    History of Present Illness 82 y.o. male with chronic combined systolic and diastolic CHF last EF in 10/2014 35 to 40%, symptomatic bradycardia status post pacemaker placement, hypertension, chronic kidney disease, diabetes mellitus type 2, dementia who presented to [facility] s/p unwitnessed fall at home. Now s/p HIP HEMIARTHROPLASTY POSTERIOR (Right).    PT Comments    Patient continues to require max/total A +2 for bed mobility/sitting EOB. Unable to stand this session. Tx limited by pain, cognitive impairments, and agitation. Current plan remains appropriate.   Follow Up Recommendations  SNF     Equipment Recommendations  None recommended by PT    Recommendations for Other Services       Precautions / Restrictions Precautions Precautions: Posterior Hip Restrictions Weight Bearing Restrictions: Yes RLE Weight Bearing: Weight bearing as tolerated    Mobility  Bed Mobility Overal bed mobility: +2 for physical assistance;Needs Assistance Bed Mobility: Sit to Supine;Supine to Sit     Supine to sit: +2 for physical assistance;Max assist;HOB elevated (pt = 25%) Sit to supine: +2 for physical assistance;Total assist   General bed mobility comments: assist to bring bilat LE and hips to EOB and elevate trunk into sitting with pt assisting with UE to elevate trunk; multimodal cues for technique; pt became agitated with all attempts of movement  Transfers                 General transfer comment: pt agitated with attempts of movement and refused attempt to transfer  Ambulation/Gait                 Stairs            Wheelchair Mobility    Modified Rankin (Stroke Patients Only)       Balance Overall balance assessment: History of Falls                                  Cognition Arousal/Alertness: Awake/alert Behavior  During Therapy: Flat affect;Agitated Overall Cognitive Status: History of cognitive impairments - at baseline       Memory: Decreased recall of precautions;Decreased short-term memory              Exercises      General Comments        Pertinent Vitals/Pain Pain Assessment: Faces Faces Pain Scale: Hurts whole lot Pain Location: R hip with movement Pain Descriptors / Indicators: Grimacing;Guarding;Operative site guarding;Sore Pain Intervention(s): Limited activity within patient's tolerance;Monitored during session;Repositioned    Home Living                      Prior Function            PT Goals (current goals can now be found in the care plan section) Acute Rehab PT Goals PT Goal Formulation: With family Time For Goal Achievement: 08/24/15 Potential to Achieve Goals: Fair Progress towards PT goals: Not progressing toward goals - comment    Frequency  Min 2X/week    PT Plan Current plan remains appropriate    Co-evaluation             End of Session Equipment Utilized During Treatment: Gait belt Activity Tolerance: Patient limited by pain;Treatment limited secondary to agitation Patient left: in bed;with call bell/phone within reach;with bed alarm set;with family/visitor present  Time: 0827-0850 PT Time Calculation (min) (ACUTE ONLY): 23 min  Charges:  $Therapeutic Activity: 23-37 mins                    G Codes:      Derek MoundKellyn R Jenise Iannelli Jahad Old, PTA Pager: [phone]   08/12/2015, 9:07 AM

## 2015-08-12 NOTE — Progress Notes (Signed)
Subjective: 3 Days Post-Op Procedure(s) (LRB): HIP HEMIARTHROPLASTY POSTERIOR (Right) Patient reports pain as mild.    Objective: Vital signs in last 24 hours: Temp:  [98.9 F (37.2 C)-99.3 F (37.4 C)] 99.3 F (37.4 C) (07/11 0435) Pulse Rate:  [70-85] 80 (07/11 0435) Resp:  [15-16] 16 (07/11 0435) BP: (122-156)/(47-60) 145/57 mmHg (07/11 0435) SpO2:  [93 %-95 %] 93 % (07/11 0435)  Intake/Output from previous day: 07/10 0701 - 07/11 0700 In: 720 [P.O.:720] Out: 700 [Urine:700] Intake/Output this shift:     Recent Labs  08/10/15 0637 08/10/15 1300 08/11/15 0252 08/12/15 0234  HGB 8.3* 8.3* 8.6* 8.5*    Recent Labs  08/11/15 0252 08/12/15 0234  WBC 4.2 4.4  RBC 2.79* 2.90*  HCT 26.1* 27.1*  PLT 79* 106*    Recent Labs  08/10/15 0637 08/11/15 0252  NA 134* 133*  K 4.2 4.2  CL 105 104  CO2 23 21*  BUN 37* 47*  CREATININE 1.86* 1.89*  GLUCOSE 234* 133*  CALCIUM 8.1* 8.1*   No results for input(s): LABPT, INR in the last 72 hours.  Neurologically intact  Assessment/Plan: 3 Days Post-Op Procedure(s) (LRB): HIP HEMIARTHROPLASTY POSTERIOR (Right) Up with therapy  , discharge to SNF. WBAT  Topher Buenaventura C 08/12/2015, 7:39 AM

## 2015-08-13 ENCOUNTER — Inpatient Hospital Stay (HOSPITAL_COMMUNITY): Payer: Medicare Other

## 2015-08-13 DIAGNOSIS — S72001S Fracture of unspecified part of neck of right femur, sequela: Secondary | ICD-10-CM

## 2015-08-13 DIAGNOSIS — N183 Chronic kidney disease, stage 3 (moderate): Secondary | ICD-10-CM

## 2015-08-13 DIAGNOSIS — I5042 Chronic combined systolic (congestive) and diastolic (congestive) heart failure: Secondary | ICD-10-CM

## 2015-08-13 DIAGNOSIS — D638 Anemia in other chronic diseases classified elsewhere: Secondary | ICD-10-CM

## 2015-08-13 LAB — GLUCOSE, CAPILLARY
GLUCOSE-CAPILLARY: 167 mg/dL — AB (ref 65–99)
GLUCOSE-CAPILLARY: 174 mg/dL — AB (ref 65–99)
GLUCOSE-CAPILLARY: 227 mg/dL — AB (ref 65–99)
Glucose-Capillary: 96 mg/dL (ref 65–99)
Glucose-Capillary: 233 mg/dL — ABNORMAL HIGH (ref 65–99)
Glucose-Capillary: 223 mg/dL — ABNORMAL HIGH (ref 65–99)

## 2015-08-13 LAB — CBC
HCT: 29.1 % — ABNORMAL LOW (ref 39.0–52.0)
Hemoglobin: 9.5 g/dL — ABNORMAL LOW (ref 13.0–17.0)
MCH: 30.2 pg (ref 26.0–34.0)
MCHC: 32.6 g/dL (ref 30.0–36.0)
MCV: 92.4 fL (ref 78.0–100.0)
PLATELETS: 133 10*3/uL — AB (ref 150–400)
RBC: 3.15 MIL/uL — ABNORMAL LOW (ref 4.22–5.81)
RDW: 14.6 % (ref 11.5–15.5)
WBC: 5.4 10*3/uL (ref 4.0–10.5)

## 2015-08-13 MED ORDER — ENOXAPARIN SODIUM 40 MG/0.4ML ~~LOC~~ SOLN: 40.0000 mg | SUBCUTANEOUS | Status: DC

## 2015-08-13 NOTE — Progress Notes (Signed)
Hydrocodone tabs 2 given orally for hip discomfort. Scanner not working.

## 2015-08-13 NOTE — Progress Notes (Signed)
Patient ID: Dakota White, male   DOB: 1928-10-26, 82 y.o.   MRN: [id number]  PROGRESS NOTE    Dakota White  JXB:[id number] DOB: May 07, 1928 DOA: 08/07/2015  PCP: Mickie Hillier, MD   Brief Narrative:  82 y.o. male with chronic combined systolic and diastolic CHF last EF in 10/2014 35 to 40%, symptomatic bradycardia status post pacemaker placement, hypertension, chronic kidney disease, diabetes mellitus type 2, dementia who presented to [facility] status post unwitnessed fall at home.   On admission, he was hemodynamically stable. CT head showed subdural hematoma on the left side which is acute on chronic S patient had subdural hematoma last month after the fall. Patient was also found to have a right hip fracture. Ortho saw the pt in consultation. Cardiology consult requested for preoperative clearance. Neurosurgery also consulted for input on subdural hematoma.  Assessment & Plan:   Principal Problem:   Closed right hip fracture (HCC) - Appreciate orthopedic surgery recommendations - Pt is s/p right hip hemiarthroplasty  - Skilled nursing facility placement on discharge - DVt proph with SCDs    SDH - Repeat CT head 7/9 overall stable  - Repeat CT head 7/12: mildly progressed Left SDH since 7/9, no symptoms, Neurosurgery following, will repeat CT head in am    Bronchitis Vs Atelectasis - Chest x-ray obtained shows moderate changes of acute bronchitis and/or asthma but no focal airspace pneumonia - Continue Azithromycin, Incentive spirometry encouraged    Acute blood loss anemia - Patient had drop in hemoglobin and platelets during this hospital stay. Hemoglobin dropped from 12-8 and platelets from 100 range to 70s. 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 Overall stable in past 48 hours.    Chronic combined systolic and diastolic congestive heart failure (HCC) - Compensated  - Continue coreg     Anemia of chronic disease - Due to CKD - Hemoglobin stable at 8.5    Thrombocytopenia (HCC) - Drop in platelets from 104 down to as low as 73 - Fortunately platelet count subsequently improving and it is 106 this morning    Malnutrition of moderate degree - In the context of chronic illness - Nutrition consulted   DVT prophylaxis: SCD's bilaterally  Code Status: DNR/DNI Family Communication: daughter at the bedside   Disposition Plan: to SNF tomorrow if CT head stable   Consultants:   Neurosurgery  Orthopedic surgery   Procedures:   Right hip hemiarthroplasty 08/08/2015   Antimicrobials:   Rocephin 08/08/2015 -->  Azithromycin 08/12/2015 -->    Subjective: Reports more coughing overnight.   Objective: Filed Vitals:   08/12/15 1308 08/12/15 1304 08/12/15 1951 08/13/15 0407  BP: 145/57 130/57 160/66 170/59  Pulse: 80 78 80 71  Temp: 99.3 F (37.4 C) 98.6 F (37 C) 98.9 F (37.2 C) 97.9 F (36.6 C)  TempSrc: Oral Oral    Resp: 16 18 17 16   SpO2: 93% 96% 96% 100%    Intake/Output Summary (Last 24 hours) at 08/13/15 1307 Last data filed at 08/13/15 1136  Gross per 24 hour  Intake    740 ml  Output   1100 ml  Net   -360 ml   There were no vitals  filed for this visit.  Examination:  General exam: No acute distress Respiratory system: bilateral air entry, no wheezing, no rhonchi Cardiovascular system: S1 & S2 (+), rate controlled Gastrointestinal system: (+) BS, nontender, nondistended Central nervous system: Nonfocal Extremities: No swelling, pulses palpable Skin: no lesions or ulcers  Psychiatry: Normal mood, behavior  Data Reviewed: I have personally reviewed following labs and imaging studies  CBC:  Recent Labs Lab 08/07/15 2338 08/08/15 1906 08/10/15 0637 08/10/15 1300 08/11/15 0252 08/12/15 0234 08/13/15 0334  WBC 7.5 5.8 4.3 4.9 4.2 4.4 5.4  NEUTROABS 6.0 4.5  --   --   --   --   --   HGB 13.2 12.3* 8.3* 8.3* 8.6* 8.5* 9.5*  HCT 40.9 38.1* 25.4* 26.1* 26.1* 27.1* 29.1*  MCV 92.5 94.1 95.1 94.9 93.5 93.4 92.4  PLT 148* 104* 78* 73* 79* 106* 133*   Basic Metabolic Panel:  Recent Labs Lab 08/07/15 2338 08/08/15 1906 08/10/15 0637 08/11/15 0252  NA 140 136 134* 133*  K 3.9 4.2 4.2 4.2  CL 107 106 105 104  CO2 24 23 23  21*  GLUCOSE 141* 204* 234* 133*  BUN 33* 29* 37* 47*  CREATININE 1.50* 1.64* 1.86* 1.89*  CALCIUM 9.3 8.7* 8.1* 8.1*   GFR: CrCl cannot be calculated (Unknown ideal weight.). Liver Function Tests:  Recent Labs Lab 08/08/15 1906  AST 23  ALT 17  ALKPHOS 76  BILITOT 0.7  PROT 7.2  ALBUMIN 3.6   No results for input(s): LIPASE, AMYLASE in the last 168 hours. No results for input(s): AMMONIA in the last 168 hours. Coagulation Profile: No results for input(s): INR, PROTIME in the last 168 hours. Cardiac Enzymes: No results for input(s): CKTOTAL, CKMB, CKMBINDEX, TROPONINI in the last 168 hours. BNP (last 3 results) No results for input(s): PROBNP in the last 8760 hours. HbA1C: No results for input(s): HGBA1C in the last 72 hours. CBG:  Recent Labs Lab 08/12/15 1949 08/13/15 0002 08/13/15 0355 08/13/15 1044 08/13/15 1212  GLUCAP 189* 174* 96 227* 233*   Lipid  Profile: No results for input(s): CHOL, HDL, LDLCALC, TRIG, CHOLHDL, LDLDIRECT in the last 72 hours. Thyroid Function Tests: No results for input(s): TSH, T4TOTAL, FREET4, T3FREE, THYROIDAB in the last 72 hours. Anemia Panel: No results for input(s): VITAMINB12, FOLATE, FERRITIN, TIBC, IRON, RETICCTPCT in the last 72 hours. Urine analysis:    Component Value Date/Time   COLORURINE YELLOW 08/07/2015 2251   APPEARANCEUR TURBID* 08/07/2015 2251   LABSPEC 1.016 08/07/2015 2251   PHURINE 6.0 08/07/2015 2251   GLUCOSEU NEGATIVE 08/07/2015 2251   HGBUR SMALL* 08/07/2015 2251   BILIRUBINUR NEGATIVE 08/07/2015 2251   BILIRUBINUR NEG 11/11/2014 1517   KETONESUR NEGATIVE 08/07/2015 2251   PROTEINUR 100* 08/07/2015 2251   PROTEINUR NEG 11/11/2014 1517   UROBILINOGEN 0.2 11/11/2014 1517   UROBILINOGEN 1.0 10/31/2014 2218   NITRITE POSITIVE* 08/07/2015 2251   NITRITE NEG 11/11/2014 1517   LEUKOCYTESUR  LARGE* 08/07/2015 2251   Sepsis Labs: @LABRCNTIP (procalcitonin:4,lacticidven:4)    Recent Results (from the past 240 hour(s))  Urine culture     Status: Abnormal   Collection Time: 08/07/15 10:51 PM  Result Value Ref Range Status   Specimen Description URINE, CLEAN CATCH  Final   Special Requests Normal  Final   Culture >=100,000 COLONIES/mL KLEBSIELLA PNEUMONIAE (A)  Final   Report Status 08/10/2015 FINAL  Final   Organism ID, Bacteria KLEBSIELLA PNEUMONIAE (A)  Final      Susceptibility   Klebsiella pneumoniae - MIC*    AMPICILLIN >=32 RESISTANT Resistant     CEFAZOLIN <=4 SENSITIVE Sensitive     CEFTRIAXONE <=1 SENSITIVE Sensitive     CIPROFLOXACIN <=0.25 SENSITIVE Sensitive     GENTAMICIN <=1 SENSITIVE Sensitive     IMIPENEM <=0.25 SENSITIVE Sensitive     NITROFURANTOIN 64 INTERMEDIATE Intermediate     TRIMETH/SULFA <=20 SENSITIVE Sensitive     AMPICILLIN/SULBACTAM 4 SENSITIVE Sensitive     PIP/TAZO <=4 SENSITIVE Sensitive     * >=100,000 COLONIES/mL KLEBSIELLA PNEUMONIAE       Radiology Studies:  Dg Chest Port 1 View 08/12/2015   Suboptimal inspiration. Moderate changes of acute bronchitis and/or asthma without focal airspace pneumonia. Electronically Signed   By: Hulan Saashomas  Lawrence M.D.   On: 08/12/2015 10:08    Ct Head Wo Contrast 08/10/2015   1. Overall stable size of small to moderate mixed density left cerebral convexity subdural hematoma, with superior redistribution of the collection as described. Stable minimal 2 mm right midline shift. 2. Generalized cerebral volume loss and mild chronic small vessel ischemia. 3. Stable small right mastoid effusion.   Dg Chest 1 View 08/07/2015  : No active disease.  Dual lead pacemaker. Electronically Signed   By: Paulina FusiMark  Shogry M.D.   On: 08/07/2015 23:28   Ct Head Wo Contrast 08/07/2015   Mixed age subdural hematoma on the left, which was present on the study of 06/18/2015. There are mixed density components, but definitely with recent hyperdense components. Maximal thickness in the middle cranial fossa region is 10 mm. Maximal thickness in the parietal region is 6 mm. No midline shift. No skull fracture. Ordinary mild degenerative changes of the cervical spine. No traumatic finding. Critical Value/emergent results were called by telephone at the time of interpretation on 08/07/2015 at 11:45 pm to Dr. Mancel BaleELLIOTT WENTZ , who verbally acknowledged these results. Electronically Signed   By: Paulina FusiMark  Shogry M.D.   On: 08/07/2015 23:49   Ct Cervical Spine Wo Contrast 08/07/2015  Mixed age subdural hematoma on the left, which was present on the study of 06/18/2015. There are mixed density components, but definitely with recent hyperdense components. Maximal thickness in the middle cranial fossa region is 10 mm. Maximal thickness in the parietal region is 6 mm. No midline shift. No skull fracture. Ordinary mild degenerative changes of the cervical spine. No traumatic finding. Critical Value/emergent results were called by telephone at the time of  interpretation on 08/07/2015 at 11:45 pm to Dr. Mancel BaleELLIOTT WENTZ , who verbally acknowledged these results. Electronically Signed   By: Paulina FusiMark  Shogry M.D.   On: 08/07/2015 23:49   Pelvis Portable 08/09/2015  RIGHT hip prosthesis without acute complication. Electronically Signed   By: Ulyses SouthwardMark  Boles M.D.   On: 08/09/2015 10:04   Dg Hip Unilat With Pelvis 2-3 Views Right 08/07/2015   Angulated femoral neck fracture on the right. Electronically Signed   By: Paulina FusiMark  Shogry M.D.   On:  08/07/2015 23:27      Scheduled Meds: . atorvastatin  20 mg Oral Daily  . azithromycin  500 mg Intravenous Q24H  . carvedilol  3.125 mg Oral BID  . cefTRIAXone (ROCEPHIN) IVPB 1 gram/50 mL D5W  1 g Intravenous Q24H  . docusate sodium  100 mg Oral BID  . donepezil  10 mg Oral QHS  . feeding supplement (GLUCERNA SHAKE)  237 mL Oral BID BM  . finasteride  5 mg Oral Daily  . insulin aspart  0-9 Units Subcutaneous Q4H  . insulin glargine  35 Units Subcutaneous QHS  . memantine  10 mg Oral QHS  . mirtazapine  15 mg Oral QHS  . sertraline  100 mg Oral QHS   Continuous Infusions: . sodium chloride 10 mL/hr at 08/09/15 1030     LOS: 5 days    Time spent: 35 minutes  Greater than 50% of the time spent on counseling and coordinating the care.   Zannie Cove, MD [facility] Pager [phone]  If 7PM-7AM, please contact night-coverage [email or web address] Password [email or web address] 08/13/2015, 1:07 PM

## 2015-08-13 NOTE — Progress Notes (Signed)
Subjective: 4 Days Post-Op Procedure(s) (LRB): HIP HEMIARTHROPLASTY POSTERIOR (Right) Patient reports pain as mild.    Objective: Vital signs in last 24 hours: Temp:  [97.9 F (36.6 White)-98.9 F (37.2 White)] 97.9 F (36.6 White) (07/12 0407) Pulse Rate:  [71-80] 71 (07/12 0407) Resp:  [16-18] 16 (07/12 0407) BP: (130-170)/(57-66) 170/59 mmHg (07/12 0407) SpO2:  [96 %-100 %] 100 % (07/12 0407)  Intake/Output from previous day: 07/11 0701 - 07/12 0700 In: 780 [P.O.:480; IV Piggyback:300] Out: 1850 [Urine:1850] Intake/Output this shift:     Recent Labs  08/10/15 1300 08/11/15 0252 08/12/15 0234 08/13/15 0334  HGB 8.3* 8.6* 8.5* 9.5*    Recent Labs  08/12/15 0234 08/13/15 0334  WBC 4.4 5.4  RBC 2.90* 3.15*  HCT 27.1* 29.1*  PLT 106* 133*    Recent Labs  08/11/15 0252  NA 133*  K 4.2  CL 104  CO2 21*  BUN 47*  CREATININE 1.89*  GLUCOSE 133*  CALCIUM 8.1*   No results for input(s): LABPT, INR in the last 72 hours.  Neurologically intact  Assessment/Plan: 4 Days Post-Op Procedure(s) (LRB): HIP HEMIARTHROPLASTY POSTERIOR (Right) Up with therapy  SNF.    Dakota White 08/13/2015, 7:52 AM

## 2015-08-13 NOTE — Progress Notes (Signed)
Patient ID: Dakota White W Ambs, male   DOB: Jan 27, 1929, 82 y.o.   MRN: [id number] Neuro stable. Ct head today

## 2015-08-14 ENCOUNTER — Inpatient Hospital Stay (HOSPITAL_COMMUNITY): Payer: Medicare Other

## 2015-08-14 DIAGNOSIS — N183 Chronic kidney disease, stage 3 (moderate): Secondary | ICD-10-CM | POA: Diagnosis not present

## 2015-08-14 DIAGNOSIS — E871 Hypo-osmolality and hyponatremia: Secondary | ICD-10-CM | POA: Diagnosis not present

## 2015-08-14 DIAGNOSIS — Z87891 Personal history of nicotine dependence: Secondary | ICD-10-CM | POA: Diagnosis not present

## 2015-08-14 DIAGNOSIS — T17308A Unspecified foreign body in larynx causing other injury, initial encounter: Secondary | ICD-10-CM | POA: Diagnosis not present

## 2015-08-14 DIAGNOSIS — S065X9A Traumatic subdural hemorrhage with loss of consciousness of unspecified duration, initial encounter: Secondary | ICD-10-CM | POA: Insufficient documentation

## 2015-08-14 DIAGNOSIS — N390 Urinary tract infection, site not specified: Secondary | ICD-10-CM | POA: Diagnosis not present

## 2015-08-14 DIAGNOSIS — E1129 Type 2 diabetes mellitus with other diabetic kidney complication: Secondary | ICD-10-CM | POA: Diagnosis not present

## 2015-08-14 DIAGNOSIS — J45909 Unspecified asthma, uncomplicated: Secondary | ICD-10-CM | POA: Diagnosis not present

## 2015-08-14 DIAGNOSIS — K5900 Constipation, unspecified: Secondary | ICD-10-CM | POA: Diagnosis not present

## 2015-08-14 DIAGNOSIS — S72001S Fracture of unspecified part of neck of right femur, sequela: Secondary | ICD-10-CM | POA: Diagnosis not present

## 2015-08-14 DIAGNOSIS — T17320A Food in larynx causing asphyxiation, initial encounter: Secondary | ICD-10-CM | POA: Diagnosis not present

## 2015-08-14 DIAGNOSIS — F0390 Unspecified dementia without behavioral disturbance: Secondary | ICD-10-CM | POA: Diagnosis not present

## 2015-08-14 DIAGNOSIS — E782 Mixed hyperlipidemia: Secondary | ICD-10-CM | POA: Diagnosis not present

## 2015-08-14 DIAGNOSIS — R2681 Unsteadiness on feet: Secondary | ICD-10-CM | POA: Diagnosis not present

## 2015-08-14 DIAGNOSIS — E46 Unspecified protein-calorie malnutrition: Secondary | ICD-10-CM | POA: Diagnosis not present

## 2015-08-14 DIAGNOSIS — J209 Acute bronchitis, unspecified: Secondary | ICD-10-CM | POA: Diagnosis not present

## 2015-08-14 DIAGNOSIS — T17300A Unspecified foreign body in larynx causing asphyxiation, initial encounter: Secondary | ICD-10-CM | POA: Diagnosis not present

## 2015-08-14 DIAGNOSIS — I6200 Nontraumatic subdural hemorrhage, unspecified: Secondary | ICD-10-CM | POA: Diagnosis not present

## 2015-08-14 DIAGNOSIS — M25551 Pain in right hip: Secondary | ICD-10-CM | POA: Diagnosis not present

## 2015-08-14 DIAGNOSIS — S065XAA Traumatic subdural hemorrhage with loss of consciousness status unknown, initial encounter: Secondary | ICD-10-CM | POA: Insufficient documentation

## 2015-08-14 DIAGNOSIS — E119 Type 2 diabetes mellitus without complications: Secondary | ICD-10-CM | POA: Diagnosis not present

## 2015-08-14 DIAGNOSIS — N3090 Cystitis, unspecified without hematuria: Secondary | ICD-10-CM | POA: Diagnosis not present

## 2015-08-14 DIAGNOSIS — E1151 Type 2 diabetes mellitus with diabetic peripheral angiopathy without gangrene: Secondary | ICD-10-CM | POA: Diagnosis not present

## 2015-08-14 DIAGNOSIS — Z950 Presence of cardiac pacemaker: Secondary | ICD-10-CM | POA: Diagnosis not present

## 2015-08-14 DIAGNOSIS — I110 Hypertensive heart disease with heart failure: Secondary | ICD-10-CM | POA: Diagnosis not present

## 2015-08-14 DIAGNOSIS — Y939 Activity, unspecified: Secondary | ICD-10-CM | POA: Diagnosis not present

## 2015-08-14 DIAGNOSIS — Z5189 Encounter for other specified aftercare: Secondary | ICD-10-CM | POA: Diagnosis not present

## 2015-08-14 DIAGNOSIS — E785 Hyperlipidemia, unspecified: Secondary | ICD-10-CM | POA: Diagnosis not present

## 2015-08-14 DIAGNOSIS — F339 Major depressive disorder, recurrent, unspecified: Secondary | ICD-10-CM | POA: Diagnosis not present

## 2015-08-14 DIAGNOSIS — R600 Localized edema: Secondary | ICD-10-CM | POA: Diagnosis not present

## 2015-08-14 DIAGNOSIS — N400 Enlarged prostate without lower urinary tract symptoms: Secondary | ICD-10-CM | POA: Diagnosis not present

## 2015-08-14 DIAGNOSIS — R5381 Other malaise: Secondary | ICD-10-CM | POA: Diagnosis not present

## 2015-08-14 DIAGNOSIS — I5042 Chronic combined systolic (congestive) and diastolic (congestive) heart failure: Secondary | ICD-10-CM | POA: Diagnosis not present

## 2015-08-14 DIAGNOSIS — M899 Disorder of bone, unspecified: Secondary | ICD-10-CM | POA: Diagnosis not present

## 2015-08-14 DIAGNOSIS — E1122 Type 2 diabetes mellitus with diabetic chronic kidney disease: Secondary | ICD-10-CM | POA: Diagnosis not present

## 2015-08-14 DIAGNOSIS — I10 Essential (primary) hypertension: Secondary | ICD-10-CM | POA: Diagnosis not present

## 2015-08-14 DIAGNOSIS — R262 Difficulty in walking, not elsewhere classified: Secondary | ICD-10-CM | POA: Diagnosis not present

## 2015-08-14 DIAGNOSIS — Y929 Unspecified place or not applicable: Secondary | ICD-10-CM | POA: Diagnosis not present

## 2015-08-14 DIAGNOSIS — R05 Cough: Secondary | ICD-10-CM | POA: Diagnosis not present

## 2015-08-14 DIAGNOSIS — R1312 Dysphagia, oropharyngeal phase: Secondary | ICD-10-CM | POA: Diagnosis not present

## 2015-08-14 DIAGNOSIS — Z9181 History of falling: Secondary | ICD-10-CM | POA: Diagnosis not present

## 2015-08-14 DIAGNOSIS — D696 Thrombocytopenia, unspecified: Secondary | ICD-10-CM | POA: Diagnosis not present

## 2015-08-14 DIAGNOSIS — Y999 Unspecified external cause status: Secondary | ICD-10-CM | POA: Diagnosis not present

## 2015-08-14 DIAGNOSIS — Z96641 Presence of right artificial hip joint: Secondary | ICD-10-CM | POA: Diagnosis not present

## 2015-08-14 DIAGNOSIS — X58XXXA Exposure to other specified factors, initial encounter: Secondary | ICD-10-CM | POA: Diagnosis not present

## 2015-08-14 DIAGNOSIS — M6281 Muscle weakness (generalized): Secondary | ICD-10-CM | POA: Diagnosis not present

## 2015-08-14 DIAGNOSIS — R41841 Cognitive communication deficit: Secondary | ICD-10-CM | POA: Diagnosis not present

## 2015-08-14 DIAGNOSIS — S72041D Displaced fracture of base of neck of right femur, subsequent encounter for closed fracture with routine healing: Secondary | ICD-10-CM | POA: Diagnosis not present

## 2015-08-14 DIAGNOSIS — D62 Acute posthemorrhagic anemia: Secondary | ICD-10-CM | POA: Diagnosis not present

## 2015-08-14 DIAGNOSIS — D638 Anemia in other chronic diseases classified elsewhere: Secondary | ICD-10-CM | POA: Diagnosis not present

## 2015-08-14 DIAGNOSIS — S72009A Fracture of unspecified part of neck of unspecified femur, initial encounter for closed fracture: Secondary | ICD-10-CM | POA: Diagnosis not present

## 2015-08-14 DIAGNOSIS — R627 Adult failure to thrive: Secondary | ICD-10-CM | POA: Diagnosis not present

## 2015-08-14 DIAGNOSIS — Z471 Aftercare following joint replacement surgery: Secondary | ICD-10-CM | POA: Diagnosis not present

## 2015-08-14 LAB — GLUCOSE, CAPILLARY
GLUCOSE-CAPILLARY: 153 mg/dL — AB (ref 65–99)
GLUCOSE-CAPILLARY: 85 mg/dL (ref 65–99)
Glucose-Capillary: 201 mg/dL — ABNORMAL HIGH (ref 65–99)
Glucose-Capillary: 71 mg/dL (ref 65–99)
Glucose-Capillary: 83 mg/dL (ref 65–99)

## 2015-08-14 MED ORDER — POLYETHYLENE GLYCOL 3350 17 G PO PACK: 17.0000 g | PACK | Freq: Every day | ORAL | Status: DC | PRN

## 2015-08-14 MED ORDER — INSULIN GLARGINE 100 UNIT/ML ~~LOC~~ SOLN: 35.0000 [IU] | Freq: Every day | SUBCUTANEOUS | Status: DC

## 2015-08-14 MED ORDER — ACETAMINOPHEN 325 MG PO TABS: 650.0000 mg | ORAL_TABLET | Freq: Four times a day (QID) | ORAL | Status: AC | PRN

## 2015-08-14 NOTE — Clinical Social Work Placement (Signed)
   CLINICAL SOCIAL WORK PLACEMENT  NOTE  Date:  08/14/2015  Patient Details  Name: Dakota White MRN: [id number] Date of Birth: [birth year]  Clinical Social Work is seeking post-discharge placement for this patient at the Skilled  Nursing Facility level of care (*CSW will initial, date and re-position this form in  chart as items are completed):  Yes   Patient/family provided with [facility] Social Work Department's list of facilities offering this level of care within the geographic area requested by the patient (or if unable, by the patient's family).  Yes   Patient/family informed of their freedom to choose among providers that offer the needed level of care, that participate in Medicare, Medicaid or managed care program needed by the patient, have an available bed and are willing to accept the patient.  Yes   Patient/family informed of [facility]'s ownership interest in [facility] and [facility], as well as of the fact that they are under no obligation to receive care at these facilities.  PASRR submitted to EDS on       PASRR number received on       Existing PASRR number confirmed on 08/14/15     FL2 transmitted to all facilities in geographic area requested by pt/family on 08/14/15     FL2 transmitted to all facilities within larger geographic area on       Patient informed that his/her managed care company has contracts with or will negotiate with certain facilities, including the following:        Yes   Patient/family informed of bed offers received.  Patient chooses bed at [facility]     Physician recommends and patient chooses bed at      [facility].  Patient to be transferred to facility by [facility]     Patient family notified on 08/14/15 of transfer.  Name of family member notified:  Dakota White     PHYSICIAN       Additional Comment:    _______________________________________________ [name], Kynslee Baham  S, LCSW 08/14/2015, 11:47 AM

## 2015-08-14 NOTE — Progress Notes (Signed)
[facility]. Transport set up for aprox 1430 to [facility]. Family aware of transfer and will  accompany. Pt demonstrates no s/sx of distress or c/o.

## 2015-08-14 NOTE — Progress Notes (Signed)
Subjective: 5 Days Post-Op Procedure(s) (LRB): HIP HEMIARTHROPLASTY POSTERIOR (Right) Patient reports pain as mild.    Objective: Vital signs in last 24 hours: Temp:  [97.7 F (36.5 C)-99.2 F (37.3 C)] 99.2 F (37.3 C) (07/13 0420) Pulse Rate:  [67-80] 67 (07/13 0420) Resp:  [16-17] 16 (07/13 0420) BP: (127-147)/(59-69) 147/69 mmHg (07/13 0420) SpO2:  [96 %-98 %] 96 % (07/13 0420)  Intake/Output from previous day: 07/12 0701 - 07/13 0700 In: 510 [P.O.:510] Out: 300 [Urine:300] Intake/Output this shift:     Recent Labs  08/12/15 0234 08/13/15 0334  HGB 8.5* 9.5*    Recent Labs  08/12/15 0234 08/13/15 0334  WBC 4.4 5.4  RBC 2.90* 3.15*  HCT 27.1* 29.1*  PLT 106* 133*   No results for input(s): NA, K, CL, CO2, BUN, CREATININE, GLUCOSE, CALCIUM in the last 72 hours. No results for input(s): LABPT, INR in the last 72 hours.  Neurologically intact dressing dry  Assessment/Plan: 5 Days Post-Op Procedure(s) (LRB): HIP HEMIARTHROPLASTY POSTERIOR (Right) Up with therapy  YATES,MARK C 08/14/2015, 7:19 AM

## 2015-08-14 NOTE — Clinical Social Work Note (Signed)
[facility]. Patient's daughter, Tamela OddiBetsy, updated regarding discharge. Patient to be transported via EMS.  RN report number: [id number]  Marcelline Deistmily Evalise Abruzzese, LCSW [phone] Orthopedics: [phone] Surgical: [phone]

## 2015-08-14 NOTE — Care Management Important Message (Signed)
Important Message  Patient Details  Name: Dakota White W [place] MRN: [id number] Date of Birth: March 30, 1928   Medicare Important Message Given:  Yes    Bernadette HoitShoffner, Mayzie Caughlin Coleman 08/14/2015, 8:23 AM

## 2015-08-14 NOTE — Discharge Summary (Signed)
Physician Discharge Summary  Dakota White ZOX:[id number] DOB: 1928-06-03 DOA: 08/07/2015  PCP: Mickie Hillier, MD  Admit date: 08/07/2015 Discharge date: 08/14/2015  Time spent:45 minutes  Recommendations for Outpatient Follow-up:  1. Dr.Mark Ophelia Charter in 2 weeks 2. Palliative care FU at SNF due to Dementia, multiple medical problems and failure to thrive   Discharge Diagnoses:  Principal Problem:   Closed right hip fracture (HCC) Active Problems:   Essential hypertension   Dementia   DM (diabetes mellitus), type 2, uncontrolled, with renal complications (HCC)   History of permanent cardiac pacemaker placement   Chronic kidney disease, stage III (moderate)   UTI (lower urinary tract infection)   Chronic combined systolic and diastolic congestive heart failure (HCC)   Anemia of chronic disease   Thrombocytopenia (HCC)   Subdural hematoma (HCC)   Pre-operative cardiovascular examination   Fall at home-(recurrent falls)   Malnutrition of moderate degree   SDH (subdural hematoma) (HCC)   Discharge Condition: stable  Diet recommendation: low sodium, DM  There were no vitals filed for this visit.  History of present illness:  82 y.o. male with chronic combined systolic and diastolic CHF last EF in 10/2014 35 to 40%, symptomatic bradycardia status post pacemaker placement, hypertension, chronic kidney disease, diabetes mellitus type 2, dementia who presented to [facility] status post unwitnessed fall at home.  On admission, CT head showed subdural hematoma on the left side which is acute on chronic S patient had subdural hematoma last month after the fall. Patient was also found to have a right hip fracture  Hospital Course:  Closed right hip fracture [facility]) - Appreciate orthopedic surgery recommendations - Pt is s/p right hip hemiarthroplasty  - Skilled nursing facility placement on discharge - DVt proph with SCDs only, unable to use aspirin or Lovenox due to concomitant  subdural hematoma    Subdural hematoma  -acute on chronic based on imaging  -Neurosurgery Dr. Jeral Fruit consulted, surgery was not recommended - Repeat CT head 7/9 overall stable  - Repeat CT head 7/12: mildly progressed Left SDH since 7/9, no symptoms - Repeat CT head this morning is stable to slightly improved since yesterday, no new neurological symptoms noted   Adult failure to thrive -In the context of dementia, protein Calorie malnutrition, chronic systolic heart failure, subdural hematoma,  poor Oral intake and now hip fracture. I discussed poor overall prognosis with daughter, she understands in the is agreeable with palliative care follow-up at the skilled nursing facility and hospice down the road if he continues to decline further   Bronchitis Vs Atelectasis - Chest x-ray obtained shows moderate changes of acute bronchitis and/or asthma but no focal airspace pneumonia -He was treated with azithromycin for 4 days -Incentive spirometry encouraged   Acute blood loss anemia - post op and hemodilution related - Hemoglobin dropped from 12 to 8 and platelets from 100 range to 70s. -  Hb improved to 9.5   UTI (lower urinary tract infection) due to Klebsiella pneumoniae - Urine culture showed Klebsiella species - completes 5days of Rocephin, stopped Abx   Essential hypertension - Continue carvedilol   Dementia without behavioral disturbance  - Stable  - Continue Aricept and Memantine    Dyslipidemia associated with type 2 DM  - Continue statin therapy     DM (diabetes mellitus), type 2, uncontrolled, 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 (HCC) - Continue Lantus 35 units at bedtime and SSI   History of permanent cardiac pacemaker placement -  Stable   Chronic kidney disease, stage III (moderate) - Baseline Cr 1.53 about 9 months ago  - Cr on this admission within baseline range but up to 1.8. Overall stable in past 48 hours.   Chronic combined  systolic and diastolic congestive heart failure (HCC) - Compensated  - Continue coreg, no evidence of volume overload, not on diuretics chronically   Anemia of chronic disease - Due to CKD - Hemoglobin stable at 8.5   Thrombocytopenia (HCC) - Drop in platelets from 104 down to as low as 73 - Fortunately platelet count subsequently improving and it is 106 this morning   Malnutrition of moderate degree - In the context of chronic illness - Nutrition consulted   Procedures:  Right hip hemiarthroplasty 08/08/2015  Consultations:  Ortho Dr.Mark Yates  Discharge Exam: Filed Vitals:   08/13/15 2034 08/14/15 0420  BP: 144/59 147/69  Pulse: 80 67  Temp: 97.7 F (36.5 C) 99.2 F (37.3 C)  Resp: 17 16    General:Alert, awake, frail, oriented to self and place only Cardiovascular: S1S2/RRR Respiratory: CTAB  Discharge Instructions   Discharge Instructions    Diet - low sodium heart healthy    Complete by:  As directed      Diet Carb Modified    Complete by:  As directed           Current Discharge Medication List    START taking these medications   Details  acetaminophen (TYLENOL) 325 MG tablet Take 2 tablets (650 mg total) by mouth every 6 (six) hours as needed for mild pain (or Fever >/= 101).    HYDROcodone-acetaminophen (NORCO) 5-325 MG tablet Take 1 tablet by mouth every 6 (six) hours as needed for moderate pain. Qty: 30 tablet, Refills: 0    polyethylene glycol (MIRALAX / GLYCOLAX) packet Take 17 g by mouth daily as needed for mild constipation. Qty: 14 each, Refills: 0      CONTINUE these medications which have CHANGED   Details  insulin glargine (LANTUS) 100 UNIT/ML injection Inject 0.35 mLs (35 Units total) into the skin at bedtime. Refills: 0      CONTINUE these medications which have NOT CHANGED   Details  atorvastatin (LIPITOR) 20 MG tablet Take 20 mg by mouth daily.    carvedilol (COREG) 3.125 MG tablet TAKE 1 TABLET BY MOUTH TWICE  DAILY Qty: 60 tablet, Refills: 1    donepezil (ARICEPT) 10 MG tablet Take 1 tablet (10 mg total) by mouth at bedtime. Qty: 30 tablet, Refills: 11    finasteride (PROSCAR) 5 MG tablet TAKE 1 TABLET BY MOUTH EVERY DAY Qty: 60 tablet, Refills: 1    memantine (NAMENDA) 10 MG tablet TAKE 1 TABLET BY MOUTH EVERY NIGHT AT BEDTIME Qty: 30 tablet, Refills: 5    mirtazapine (REMERON) 15 MG tablet Take 15 mg by mouth at bedtime.     sertraline (ZOLOFT) 100 MG tablet Take 1 tablet (100 mg total) by mouth at bedtime. Qty: 30 tablet, Refills: 11    glucose blood test strip Once daily    ONETOUCH DELICA LANCETS 33G MISC 1 Device by Does not apply route 4 (four) times daily as needed. Qty: 200 each, Refills: 11      STOP taking these medications     aspirin 325 MG tablet      rosuvastatin (CRESTOR) 5 MG tablet        Allergies  Allergen Reactions  . Actos [Pioglitazone] Other (See Comments)    "makes me feel  lousy and makes me bleed"  . Ramipril Other (See Comments)    "pt not aware of allergy"   Follow-up Information    Follow up with YATES,MARK C, MD In 2 weeks.   Specialty:  Orthopedic Surgery   Contact information:   [address] [place] [address] [phone]       Follow up with Mickie Hillier, MD. Schedule an appointment as soon as possible for a visit in 1 week.   Specialty:  Family Medicine   Contact information:   [address] [place] [address] [phone]        The results of significant diagnostics from this hospitalization (including imaging, microbiology, ancillary and laboratory) are listed below for reference.    Significant Diagnostic Studies: Dg Chest 1 View  08/07/2015  CLINICAL DATA:  Unwitnessed fall 2 hours ago. EXAM: CHEST 1 VIEW COMPARISON:  10/31/2014 FINDINGS: Dual lead pacemaker well positioned. Heart size is normal. Mediastinal shadows are normal. The lungs are clear. No edema, infiltrate, collapse or effusion. No acute bone  finding in the chest. IMPRESSION: No active disease.  Dual lead pacemaker. Electronically Signed   By: Paulina Fusi M.D.   On: 08/07/2015 23:28   Ct Head Wo Contrast  08/14/2015  CLINICAL DATA:  Subdural hematoma EXAM: CT HEAD WITHOUT CONTRAST TECHNIQUE: Contiguous axial images were obtained from the base of the skull through the vertex without intravenous contrast. COMPARISON:  Head CT 08/13/2015 FINDINGS: Mixed density left convexity subdural hematoma measures 10 mm in thickness, unchanged. There is layering hyperdensity dependently. Mild mass effect on the left hemisphere and slight with midline shift measuring 3.5 mm is unchanged. There are no new areas of hemorrhage. No evidence of acute cortical infarct. The paranasal sinuses and mastoids are free of fluid. The orbits are normal. Visualized nasopharynx is clear. IMPRESSION: Unchanged size of left convexity subdural hematoma with persistent rightward midline shift measuring 3 mm. Electronically Signed   By: Deatra Robinson M.D.   On: 08/14/2015 08:15   Ct Head Wo Contrast  08/13/2015  CLINICAL DATA:  82 year old male with left subdural hematoma since May. Initial encounter. EXAM: CT HEAD WITHOUT CONTRAST TECHNIQUE: Contiguous axial images were obtained from the base of the skull through the vertex without intravenous contrast. COMPARISON:  08/10/2015 and earlier. FINDINGS: Visualized paranasal sinuses and mastoids are stable and well pneumatized. No acute orbit or scalp soft tissue findings. Stable skin staples along the right vertex. Calvarium intact. No acute osseous abnormality identified. Mixed density the mostly low-density left side subdural hematoma measures up to 11 mm in thickness now, versus 8-9 mm previously. The subdural is largest along the posterior convexity. Anteriorly the hemorrhage measures more like 7-8 mm, stable. Rightward midline shift of 4 mm is increased (series 2, image 17). Mass effect on the left lateral ventricle is not  significantly changed. No ventriculomegaly. No other extra-axial collection or new intracranial hemorrhage. Stable gray-white matter differentiation throughout the brain. No cortically based acute infarct identified. No suspicious intracranial vascular hyperdensity. Calcified atherosclerosis at the skull base. IMPRESSION: 1. Progressed left subdural hematoma since 08/10/2015. Mildly increased rightward midline shift of 4 mm. 2. No new intracranial abnormality. Electronically Signed   By: Odessa Fleming M.D.   On: 08/13/2015 09:56   Ct Head Wo Contrast  08/10/2015  CLINICAL DATA:  Follow-up left subdural hematoma. EXAM: CT HEAD WITHOUT CONTRAST TECHNIQUE: Contiguous axial images were obtained from the base of the skull through the vertex without intravenous contrast. COMPARISON:  08/07/2015 head CT. FINDINGS: Small to moderate mixed density left cerebral convexity subdural hematoma is overall not appreciably changed in size, with redistribution towards the more superior portion of the left cerebral convexity, with thickness of 8 mm in the middle cranial fossa, previously 10 mm, thickness of 6 mm in the left parietal region, previously 6 mm, and thickness of 5 mm in the superior left frontoparietal convexity, previously 3 mm. The overall density of the left subdural hematoma is mildly decreased in the interval. No bright extra-axial collection. Minimal 2 mm right midline shift, not definitely changed. Effacement of the left cerebral sulci has decreased in the left middle cranial fossa and increased in the left superior convexity. No acute intra-axial hemorrhage. No CT evidence of acute infarction. Intracranial atherosclerosis. Nonspecific mild subcortical and periventricular white matter hypodensity, most in keeping with chronic small vessel ischemic change. Generalized cerebral volume loss. Ventricle sizes are stable with no acute hydrocephalus. The visualized paranasal sinuses are essentially clear. Stable small right  mastoid effusion. Clear left mastoid air cells. No evidence of calvarial fracture. New skin staples in the right superior parietal scalp. IMPRESSION: 1. Overall stable size of small to moderate mixed density left cerebral convexity subdural hematoma, with superior redistribution of the collection as described. Stable minimal 2 mm right midline shift. 2. Generalized cerebral volume loss and mild chronic small vessel ischemia. 3. Stable small right mastoid effusion. Electronically Signed   By: Delbert PhenixJason A Poff M.D.   On: 08/10/2015 12:15   Ct Head Wo Contrast  08/07/2015  CLINICAL DATA:  Unwitnessed fall 2 hours ago. Found on floor. Laceration of the back of the head. EXAM: CT HEAD WITHOUT CONTRAST CT CERVICAL SPINE WITHOUT CONTRAST TECHNIQUE: Multidetector CT imaging of the head and cervical spine was performed following the standard protocol without intravenous contrast. Multiplanar CT image reconstructions of the cervical spine were also generated. COMPARISON:  06/18/2015 FINDINGS: CT HEAD FINDINGS Right parietal scalp laceration. No underlying skull fracture. No radiopaque foreign object. The brain shows generalized atrophy. Again demonstrated is a subdural hematoma along the convexity on the left which appears mixed age. There are definitely hyperdense components. Maximal thickness in the middle cranial fossa is 1 cm. Maximal thickness over the left parietal region is 6 mm. No midline shift mild chronic small-vessel ischemic changes affect the white matter. CT CERVICAL SPINE FINDINGS Alignment is normal. No fracture. There is chronic degenerative spondylosis at C5-6 and C6-7 but no significant stenosis. Ordinary osteoarthritis of the C1-2 articulation. IMPRESSION: Mixed age subdural hematoma on the left, which was present on the study of 06/18/2015. There are mixed density components, but definitely with recent hyperdense components. Maximal thickness in the middle cranial fossa region is 10 mm. Maximal thickness  in the parietal region is 6 mm. No midline shift. No skull fracture. Ordinary mild degenerative changes of the cervical spine. No traumatic finding. Critical Value/emergent results were called by telephone at the time of interpretation on 08/07/2015 at 11:45 pm to Dr. Mancel BaleELLIOTT WENTZ , who verbally acknowledged these results. Electronically Signed   By: Paulina FusiMark  Shogry M.D.   On: 08/07/2015 23:49   Ct Cervical Spine Wo Contrast  08/07/2015  CLINICAL DATA:  Unwitnessed fall 2 hours ago. Found on floor. Laceration of the back of the head. EXAM: CT HEAD WITHOUT CONTRAST CT CERVICAL SPINE WITHOUT CONTRAST TECHNIQUE: Multidetector CT imaging of the head and cervical spine was performed following the standard protocol without intravenous contrast. Multiplanar CT image reconstructions of the cervical spine were also  generated. COMPARISON:  06/18/2015 FINDINGS: CT HEAD FINDINGS Right parietal scalp laceration. No underlying skull fracture. No radiopaque foreign object. The brain shows generalized atrophy. Again demonstrated is a subdural hematoma along the convexity on the left which appears mixed age. There are definitely hyperdense components. Maximal thickness in the middle cranial fossa is 1 cm. Maximal thickness over the left parietal region is 6 mm. No midline shift mild chronic small-vessel ischemic changes affect the white matter. CT CERVICAL SPINE FINDINGS Alignment is normal. No fracture. There is chronic degenerative spondylosis at C5-6 and C6-7 but no significant stenosis. Ordinary osteoarthritis of the C1-2 articulation. IMPRESSION: Mixed age subdural hematoma on the left, which was present on the study of 06/18/2015. There are mixed density components, but definitely with recent hyperdense components. Maximal thickness in the middle cranial fossa region is 10 mm. Maximal thickness in the parietal region is 6 mm. No midline shift. No skull fracture. Ordinary mild degenerative changes of the cervical spine. No  traumatic finding. Critical Value/emergent results were called by telephone at the time of interpretation on 08/07/2015 at 11:45 pm to Dr. Mancel Bale , who verbally acknowledged these results. Electronically Signed   By: Paulina Fusi M.D.   On: 08/07/2015 23:49   Pelvis Portable  08/09/2015  CLINICAL DATA:  Hip fracture post operative repair EXAM: PORTABLE PELVIS 1-2 VIEWS COMPARISON:  Portable exam 0957 hours compared to 08/07/2015 FINDINGS: Interval resection of RIGHT femoral head/neck. RIGHT hip prosthesis now identified without fracture or dislocation. Bones demineralized. Penile prosthesis noted. Visualized pelvis unremarkable. Scattered atherosclerotic calcifications. IMPRESSION: RIGHT hip prosthesis without acute complication. Electronically Signed   By: Ulyses Southward M.D.   On: 08/09/2015 10:04   Dg Chest Port 1 View  08/12/2015  CLINICAL DATA:  Acute onset of productive cough and chest congestion three days ago. Postop day 3 right hip arthroplasty. EXAM: PORTABLE CHEST 1 VIEW COMPARISON:  08/07/2015, 10/31/2014 and earlier. FINDINGS: Suboptimal inspiration accounts for crowded bronchovascular markings in the bases, and accentuates the cardiac silhouette. Taking this into account, cardiac silhouette normal in size, unchanged. Left subclavian dual lead transvenous pacemaker unchanged. Moderate central peribronchial thickening and prominent bronchovascular markings diffusely, more so than on the prior examinations. No confluent airspace consolidation. No pleural effusions. IMPRESSION: Suboptimal inspiration. Moderate changes of acute bronchitis and/or asthma without focal airspace pneumonia. Electronically Signed   By: Hulan Saas M.D.   On: 08/12/2015 10:08   Dg Hip Unilat With Pelvis 2-3 Views Right  08/07/2015  CLINICAL DATA:  Unwitnessed fall 2 hours ago. Dementia. Pain and deformity. EXAM: DG HIP (WITH OR WITHOUT PELVIS) 2-3V RIGHT COMPARISON:  None. FINDINGS: Angulated femoral neck fracture on  the right. No pelvic fracture. Left hip intact. IMPRESSION: Angulated femoral neck fracture on the right. Electronically Signed   By: Paulina Fusi M.D.   On: 08/07/2015 23:27    Microbiology: Recent Results (from the past 240 hour(s))  Urine culture     Status: Abnormal   Collection Time: 08/07/15 10:51 PM  Result Value Ref Range Status   Specimen Description URINE, CLEAN CATCH  Final   Special Requests Normal  Final   Culture >=100,000 COLONIES/mL KLEBSIELLA PNEUMONIAE (A)  Final   Report Status 08/10/2015 FINAL  Final   Organism ID, Bacteria KLEBSIELLA PNEUMONIAE (A)  Final      Susceptibility   Klebsiella pneumoniae - MIC*    AMPICILLIN >=32 RESISTANT Resistant     CEFAZOLIN <=4 SENSITIVE Sensitive     CEFTRIAXONE <=1 SENSITIVE Sensitive  CIPROFLOXACIN <=0.25 SENSITIVE Sensitive     GENTAMICIN <=1 SENSITIVE Sensitive     IMIPENEM <=0.25 SENSITIVE Sensitive     NITROFURANTOIN 64 INTERMEDIATE Intermediate     TRIMETH/SULFA <=20 SENSITIVE Sensitive     AMPICILLIN/SULBACTAM 4 SENSITIVE Sensitive     PIP/TAZO <=4 SENSITIVE Sensitive     * >=100,000 COLONIES/mL KLEBSIELLA PNEUMONIAE     Labs: Basic Metabolic Panel:  Recent Labs Lab 08/07/15 2338 08/08/15 1906 08/10/15 0637 08/11/15 0252  NA 140 136 134* 133*  K 3.9 4.2 4.2 4.2  CL 107 106 105 104  CO2 21*  GLUCOSE 141* 204* 234* 133*  BUN 33* 29* 37* 47*  CREATININE 1.50* 1.64* 1.86* 1.89*  CALCIUM 9.3 8.7* 8.1* 8.1*   Liver Function Tests:  Recent Labs Lab 08/08/15 1906  AST 23  ALT 17  ALKPHOS 76  BILITOT 0.7  PROT 7.2  ALBUMIN 3.6   No results for input(s): LIPASE, AMYLASE in the last 168 hours. No results for input(s): AMMONIA in the last 168 hours. CBC:  Recent Labs Lab 08/07/15 2338 08/08/15 1906 08/10/15 0637 08/10/15 1300 08/11/15 0252 08/12/15 0234 08/13/15 0334  WBC 7.5 5.8 4.3 4.9 4.2 4.4 5.4  NEUTROABS 6.0 4.5  --   --   --   --   --   HGB 13.2 12.3* 8.3* 8.3* 8.6* 8.5*  9.5*  HCT 40.9 38.1* 25.4* 26.1* 26.1* 27.1* 29.1*  MCV 92.5 94.1 95.1 94.9 93.5 93.4 92.4  PLT 148* 104* 78* 73* 79* 106* 133*   Cardiac Enzymes: No results for input(s): CKTOTAL, CKMB, CKMBINDEX, TROPONINI in the last 168 hours. BNP: BNP (last 3 results) No results for input(s): BNP in the last 8760 hours.  ProBNP (last 3 results) No results for input(s): PROBNP in the last 8760 hours.  CBG:  Recent Labs Lab 08/13/15 1945 08/14/15 08/14/15 0031 08/14/15 0406 08/14/15 0843  GLUCAP 167* 71 85 153* 83       Signed:  Star Resler MD.  [facility] 08/14/2015, 11:29 AM

## 2015-08-15 ENCOUNTER — Non-Acute Institutional Stay (SKILLED_NURSING_FACILITY): Payer: Medicare Other | Admitting: Internal Medicine

## 2015-08-15 ENCOUNTER — Encounter: Payer: Self-pay | Admitting: Internal Medicine

## 2015-08-15 DIAGNOSIS — E46 Unspecified protein-calorie malnutrition: Secondary | ICD-10-CM | POA: Diagnosis not present

## 2015-08-15 DIAGNOSIS — R5381 Other malaise: Secondary | ICD-10-CM

## 2015-08-15 DIAGNOSIS — S72001S Fracture of unspecified part of neck of right femur, sequela: Secondary | ICD-10-CM

## 2015-08-15 DIAGNOSIS — N183 Chronic kidney disease, stage 3 unspecified: Secondary | ICD-10-CM

## 2015-08-15 DIAGNOSIS — I6200 Nontraumatic subdural hemorrhage, unspecified: Secondary | ICD-10-CM

## 2015-08-15 DIAGNOSIS — N3090 Cystitis, unspecified without hematuria: Secondary | ICD-10-CM

## 2015-08-15 DIAGNOSIS — R627 Adult failure to thrive: Secondary | ICD-10-CM

## 2015-08-15 DIAGNOSIS — E785 Hyperlipidemia, unspecified: Secondary | ICD-10-CM

## 2015-08-15 DIAGNOSIS — D62 Acute posthemorrhagic anemia: Secondary | ICD-10-CM

## 2015-08-15 DIAGNOSIS — E871 Hypo-osmolality and hyponatremia: Secondary | ICD-10-CM

## 2015-08-15 DIAGNOSIS — F339 Major depressive disorder, recurrent, unspecified: Secondary | ICD-10-CM | POA: Diagnosis not present

## 2015-08-15 DIAGNOSIS — Z794 Long term (current) use of insulin: Secondary | ICD-10-CM

## 2015-08-15 DIAGNOSIS — D696 Thrombocytopenia, unspecified: Secondary | ICD-10-CM

## 2015-08-15 DIAGNOSIS — F0390 Unspecified dementia without behavioral disturbance: Secondary | ICD-10-CM

## 2015-08-15 DIAGNOSIS — IMO0002 Reserved for concepts with insufficient information to code with codable children: Secondary | ICD-10-CM

## 2015-08-15 DIAGNOSIS — E1122 Type 2 diabetes mellitus with diabetic chronic kidney disease: Secondary | ICD-10-CM

## 2015-08-15 DIAGNOSIS — B961 Klebsiella pneumoniae [K. pneumoniae] as the cause of diseases classified elsewhere: Secondary | ICD-10-CM

## 2015-08-15 DIAGNOSIS — E1165 Type 2 diabetes mellitus with hyperglycemia: Secondary | ICD-10-CM

## 2015-08-15 DIAGNOSIS — I10 Essential (primary) hypertension: Secondary | ICD-10-CM

## 2015-08-15 DIAGNOSIS — B9689 Other specified bacterial agents as the cause of diseases classified elsewhere: Secondary | ICD-10-CM

## 2015-08-15 DIAGNOSIS — J209 Acute bronchitis, unspecified: Secondary | ICD-10-CM

## 2015-08-15 DIAGNOSIS — K5900 Constipation, unspecified: Secondary | ICD-10-CM

## 2015-08-15 DIAGNOSIS — S065XAA Traumatic subdural hemorrhage with loss of consciousness status unknown, initial encounter: Secondary | ICD-10-CM

## 2015-08-15 DIAGNOSIS — S065X9A Traumatic subdural hemorrhage with loss of consciousness of unspecified duration, initial encounter: Secondary | ICD-10-CM

## 2015-08-15 NOTE — Progress Notes (Signed)
LOCATION: [facility]  PCP: Mickie Hillier, MD   Code Status: DNR  Goals of care: Advanced Directive information Advanced Directives 08/15/2015  Does patient have an advance directive? Yes  Type of Advance Directive Out of facility DNR (pink MOST or yellow form)  Does patient want to make changes to advanced directive? No - Patient declined  Copy of advanced directive(s) in chart? Yes  Would patient like information on creating an advanced directive? -       Extended Emergency Contact Information Primary Emergency Contact: Canoy-Lewis,[name] Address: [address] [address]          [place], [place] [address] [place] Home Phone: [phone] Relation: Daughter Secondary Emergency Contact: Hendriks,Steve Address: [address]  [place] Home Phone: [phone] Relation: Son   Allergies  Allergen Reactions  . Actos [Pioglitazone] Other (See Comments)    "makes me feel lousy and makes me bleed"  . Ramipril Other (See Comments)    "pt not aware of allergy"    Chief Complaint  Patient presents with  . New Admit To SNF    New Admission     HPI:  Patient is a 82 y.o. male seen today for short term rehabilitation post hospital admission from 08/07/15-08/14/15 with closed right hip fracture post fall and subdural hematoma. He underwent right hip hemiarthroplasty. He was also treated for acute bronchitis and klebsiella UTI this admission. He was seen by neurosurgery and conservative management was recommended. He was started on antibiotics. He has dementia and this limits his HPI and ROS. His son and daughter are present in the room.   Review of Systems:  Constitutional: Negative for fever, chills, diaphoresis. Energy level is fair. HENT: Negative for headache, congestion, nasal discharge. Eyes: Negative fordischarge.  Respiratory: Negative for shortness of breath and wheezing. Positive for occasional cough.   Cardiovascular: Negative for chest pain,  palpitations, leg swelling.  Gastrointestinal: Negative for heartburn, nausea, vomiting, abdominal pain. had a bowel movement yesterday Genitourinary: Negative for flank pain.  Musculoskeletal: Negative for fall in the facility.  Skin: Negative for itching, rash.  Neurological: Negative for dizziness. Psychiatric/Behavioral: she has memory loss.    Past Medical History  Diagnosis Date  . Type II or unspecified type diabetes mellitus without mention of complication, uncontrolled   . Essential hypertension   . Mental disorder   . Dementia   . Peripheral neuropathy (HCC)   . Allergic rhinitis   . Asthma with bronchitis   . Complete heart block (HCC)     a. Requiring permanent [employer] DDD pacemaker by Dr. Ladona Ridgel in 2013.  Marland Kitchen Peripheral vascular disease, unspecified (HCC)   . Cardiac pacemaker in situ   . Mixed hyperlipidemia   . Chronic combined systolic and diastolic CHF (congestive heart failure) (HCC)     a. EF noted to be down 10/2014 - 2D echo 10/17/14: EF 35-40%, mild LVH,  moderate HK of mid-apical anteroseptal and anterior myocardium, HK of inferior myocardium, grade 1 DD, mild MR.  . Diabetes mellitus with renal complications (HCC)   . Dementia   . BPH (benign prostatic hyperplasia)   . General weakness   . Anemia of chronic disease   . Thrombocytopenia [facility])    Past Surgical History  Procedure Laterality Date  . Rotator cuff repair    . Pacemaker insertion  Dec 2013  . Permanent pacemaker insertion N/A 01/31/2012    Procedure: PERMANENT PACEMAKER INSERTION;  Surgeon: Marinus Maw, MD;  Location: [facility]  CATH LAB;  Service: Cardiovascular;  Laterality: N/A;  . Hip arthroplasty Right 08/09/2015    Procedure: HIP HEMIARTHROPLASTY POSTERIOR;  Surgeon: Eldred Manges, MD;  Location: [facility] OR;  Service: Orthopedics;  Laterality: Right;   Social History:   reports that he has quit smoking. His smoking use included Cigarettes. He has never used smokeless tobacco. He reports that he does  not drink alcohol or use illicit drugs.  Family History  Problem Relation Age of Onset  . Heart disease Mother   . Alzheimer's disease Father   . Diabetes Sister   . COPD Brother   . Alzheimer's disease Sister     Medications:   Medication List       This list is accurate as of: 08/15/15  3:19 PM.  Always use your most recent med list.               acetaminophen 325 MG tablet  Commonly known as:  TYLENOL  Take 2 tablets (650 mg total) by mouth every 6 (six) hours as needed for mild pain (or Fever >/= 101).     atorvastatin 20 MG tablet  Commonly known as:  LIPITOR  Take 20 mg by mouth daily.     carvedilol 3.125 MG tablet  Commonly known as:  COREG  TAKE 1 TABLET BY MOUTH TWICE DAILY     donepezil 10 MG tablet  Commonly known as:  ARICEPT  Take 1 tablet (10 mg total) by mouth at bedtime.     finasteride 5 MG tablet  Commonly known as:  PROSCAR  TAKE 1 TABLET BY MOUTH EVERY DAY     glucose blood test strip  Once daily     HYDROcodone-acetaminophen 5-325 MG tablet  Commonly known as:  NORCO  Take 1 tablet by mouth every 6 (six) hours as needed for moderate pain.     insulin glargine 100 UNIT/ML injection  Commonly known as:  LANTUS  Inject 0.35 mLs (35 Units total) into the skin at bedtime.     memantine 10 MG tablet  Commonly known as:  NAMENDA  TAKE 1 TABLET BY MOUTH EVERY NIGHT AT BEDTIME     mirtazapine 15 MG tablet  Commonly known as:  REMERON  Take 15 mg by mouth at bedtime.     ONETOUCH DELICA LANCETS 33G Misc  1 Device by Does not apply route 4 (four) times daily as needed.     polyethylene glycol packet  Commonly known as:  MIRALAX / GLYCOLAX  Take 17 g by mouth daily as needed for mild constipation.     sertraline 100 MG tablet  Commonly known as:  ZOLOFT  Take 1 tablet (100 mg total) by mouth at bedtime.        Immunizations: Immunization History  Administered Date(s) Administered  . Influenza Whole 11/09/2007  .  Influenza,inj,Quad PF,36+ Mos 11/02/2014     Physical Exam: Filed Vitals:   08/15/15 1515  BP: 146/74  Pulse: 74  Temp: 98 F (36.7 C)  TempSrc: Oral  Resp: 20  SpO2: 96%    General- elderly male, thin built and frail, in no acute distress Head- normocephalic, atraumatic Nose- no nasal discharge Throat- moist mucus membrane, missing teeth  Eyes- PERRLA, EOMI, no pallor, no icterus Neck- no cervical lymphadenopathy Cardiovascular- normal s1,s2, no murmur, trace leg edema, pacemaker lead to left chest wall Respiratory- bilateral poor air movement, no wheeze, + rhonchi, no crackles, no use of accessory muscles Abdomen- bowel sounds present, soft, non  tender Musculoskeletal- able to move all 4 extremities, generalized weakness, limited right leg range of motion Neurological- alert and oriented to person only Skin- warm and dry, skin tear to right elbow, easy bruising +   Labs reviewed: Basic Metabolic Panel:  Recent Labs  14/78/29 1228 10/16/14 1808  08/08/15 1906 08/10/15 0637 08/11/15 0252  NA  --   --   < > 136 134* 133*  K  --   --   < > 4.2 4.2 4.2  CL  --   --   < > 106 105 104  CO2  --   --   < > 23 23 21*  GLUCOSE  --   --   < > 204* 234* 133*  BUN  --   --   < > 29* 37* 47*  CREATININE  --  1.50*  < > 1.64* 1.86* 1.89*  CALCIUM  --   --   < > 8.7* 8.1* 8.1*  MG 1.8 1.7  --   --   --   --   PHOS 2.8  --   --   --   --   --   < > = values in this interval not displayed. Liver Function Tests:  Recent Labs  10/21/14 2223 11/01/14 0230 08/08/15 1906  AST 26 26 23   ALT 21 19 17   ALKPHOS 54 60 76  BILITOT 0.7 0.6 0.7  PROT 7.7 6.6 7.2  ALBUMIN 4.2 3.5 3.6    Recent Labs  10/16/14 1214 10/31/14 2126  LIPASE 39 30   No results for input(s): AMMONIA in the last 8760 hours. CBC:  Recent Labs  06/18/15 1140 08/07/15 2338 08/08/15 1906  08/11/15 0252 08/12/15 0234 08/13/15 0334  WBC 6.4 7.5 5.8  < > 4.2 4.4 5.4  NEUTROABS 4.4 6.0 4.5  --    --   --   --   HGB 11.5* 13.2 12.3*  < > 8.6* 8.5* 9.5*  HCT 34.4* 40.9 38.1*  < > 26.1* 27.1* 29.1*  MCV 91.5 92.5 94.1  < > 93.5 93.4 92.4  PLT 101* 148* 104*  < > 79* 106* 133*  < > = values in this interval not displayed. Cardiac Enzymes:  Recent Labs  10/16/14 1808 10/17/14 0015 10/17/14 0528  TROPONINI <0.03 <0.03 <0.03   BNP: Invalid input(s): POCBNP CBG:  Recent Labs  08/14/15 0406 08/14/15 0843 08/14/15 1122  GLUCAP 153* 83 201*    Radiological Exams: Dg Chest 1 View  08/07/2015  CLINICAL DATA:  Unwitnessed fall 2 hours ago. EXAM: CHEST 1 VIEW COMPARISON:  10/31/2014 FINDINGS: Dual lead pacemaker well positioned. Heart size is normal. Mediastinal shadows are normal. The lungs are clear. No edema, infiltrate, collapse or effusion. No acute bone finding in the chest. IMPRESSION: No active disease.  Dual lead pacemaker. Electronically Signed   By: Paulina Fusi M.D.   On: 08/07/2015 23:28   Ct Head Wo Contrast  08/14/2015  CLINICAL DATA:  Subdural hematoma EXAM: CT HEAD WITHOUT CONTRAST TECHNIQUE: Contiguous axial images were obtained from the base of the skull through the vertex without intravenous contrast. COMPARISON:  Head CT 08/13/2015 FINDINGS: Mixed density left convexity subdural hematoma measures 10 mm in thickness, unchanged. There is layering hyperdensity dependently. Mild mass effect on the left hemisphere and slight with midline shift measuring 3.5 mm is unchanged. There are no new areas of hemorrhage. No evidence of acute cortical infarct. The paranasal sinuses and mastoids are free of fluid. The  orbits are normal. Visualized nasopharynx is clear. IMPRESSION: Unchanged size of left convexity subdural hematoma with persistent rightward midline shift measuring 3 mm. Electronically Signed   By: Deatra RobinsonKevin  Herman M.D.   On: 08/14/2015 08:15   Ct Head Wo Contrast  08/13/2015  CLINICAL DATA:  82 year old male with left subdural hematoma since May. Initial encounter. EXAM:  CT HEAD WITHOUT CONTRAST TECHNIQUE: Contiguous axial images were obtained from the base of the skull through the vertex without intravenous contrast. COMPARISON:  08/10/2015 and earlier. FINDINGS: Visualized paranasal sinuses and mastoids are stable and well pneumatized. No acute orbit or scalp soft tissue findings. Stable skin staples along the right vertex. Calvarium intact. No acute osseous abnormality identified. Mixed density the mostly low-density left side subdural hematoma measures up to 11 mm in thickness now, versus 8-9 mm previously. The subdural is largest along the posterior convexity. Anteriorly the hemorrhage measures more like 7-8 mm, stable. Rightward midline shift of 4 mm is increased (series 2, image 17). Mass effect on the left lateral ventricle is not significantly changed. No ventriculomegaly. No other extra-axial collection or new intracranial hemorrhage. Stable gray-white matter differentiation throughout the brain. No cortically based acute infarct identified. No suspicious intracranial vascular hyperdensity. Calcified atherosclerosis at the skull base. IMPRESSION: 1. Progressed left subdural hematoma since 08/10/2015. Mildly increased rightward midline shift of 4 mm. 2. No new intracranial abnormality. Electronically Signed   By: Odessa FlemingH  Hall M.D.   On: 08/13/2015 09:56   Ct Head Wo Contrast  08/10/2015  CLINICAL DATA:  Follow-up left subdural hematoma. EXAM: CT HEAD WITHOUT CONTRAST TECHNIQUE: Contiguous axial images were obtained from the base of the skull through the vertex without intravenous contrast. COMPARISON:  08/07/2015 head CT. FINDINGS: Small to moderate mixed density left cerebral convexity subdural hematoma is overall not appreciably changed in size, with redistribution towards the more superior portion of the left cerebral convexity, with thickness of 8 mm in the middle cranial fossa, previously 10 mm, thickness of 6 mm in the left parietal region, previously 6 mm, and thickness  of 5 mm in the superior left frontoparietal convexity, previously 3 mm. The overall density of the left subdural hematoma is mildly decreased in the interval. No bright extra-axial collection. Minimal 2 mm right midline shift, not definitely changed. Effacement of the left cerebral sulci has decreased in the left middle cranial fossa and increased in the left superior convexity. No acute intra-axial hemorrhage. No CT evidence of acute infarction. Intracranial atherosclerosis. Nonspecific mild subcortical and periventricular white matter hypodensity, most in keeping with chronic small vessel ischemic change. Generalized cerebral volume loss. Ventricle sizes are stable with no acute hydrocephalus. The visualized paranasal sinuses are essentially clear. Stable small right mastoid effusion. Clear left mastoid air cells. No evidence of calvarial fracture. New skin staples in the right superior parietal scalp. IMPRESSION: 1. Overall stable size of small to moderate mixed density left cerebral convexity subdural hematoma, with superior redistribution of the collection as described. Stable minimal 2 mm right midline shift. 2. Generalized cerebral volume loss and mild chronic small vessel ischemia. 3. Stable small right mastoid effusion. Electronically Signed   By: Delbert PhenixJason A Poff M.D.   On: 08/10/2015 12:15   Ct Head Wo Contrast  08/07/2015  CLINICAL DATA:  Unwitnessed fall 2 hours ago. Found on floor. Laceration of the back of the head. EXAM: CT HEAD WITHOUT CONTRAST CT CERVICAL SPINE WITHOUT CONTRAST TECHNIQUE: Multidetector CT imaging of the head and cervical spine was performed following the standard protocol  without intravenous contrast. Multiplanar CT image reconstructions of the cervical spine were also generated. COMPARISON:  06/18/2015 FINDINGS: CT HEAD FINDINGS Right parietal scalp laceration. No underlying skull fracture. No radiopaque foreign object. The brain shows generalized atrophy. Again demonstrated is a  subdural hematoma along the convexity on the left which appears mixed age. There are definitely hyperdense components. Maximal thickness in the middle cranial fossa is 1 cm. Maximal thickness over the left parietal region is 6 mm. No midline shift mild chronic small-vessel ischemic changes affect the white matter. CT CERVICAL SPINE FINDINGS Alignment is normal. No fracture. There is chronic degenerative spondylosis at C5-6 and C6-7 but no significant stenosis. Ordinary osteoarthritis of the C1-2 articulation. IMPRESSION: Mixed age subdural hematoma on the left, which was present on the study of 06/18/2015. There are mixed density components, but definitely with recent hyperdense components. Maximal thickness in the middle cranial fossa region is 10 mm. Maximal thickness in the parietal region is 6 mm. No midline shift. No skull fracture. Ordinary mild degenerative changes of the cervical spine. No traumatic finding. Critical Value/emergent results were called by telephone at the time of interpretation on 08/07/2015 at 11:45 pm to Dr. Mancel Bale , who verbally acknowledged these results. Electronically Signed   By: Paulina Fusi M.D.   On: 08/07/2015 23:49   Ct Cervical Spine Wo Contrast  08/07/2015  CLINICAL DATA:  Unwitnessed fall 2 hours ago. Found on floor. Laceration of the back of the head. EXAM: CT HEAD WITHOUT CONTRAST CT CERVICAL SPINE WITHOUT CONTRAST TECHNIQUE: Multidetector CT imaging of the head and cervical spine was performed following the standard protocol without intravenous contrast. Multiplanar CT image reconstructions of the cervical spine were also generated. COMPARISON:  06/18/2015 FINDINGS: CT HEAD FINDINGS Right parietal scalp laceration. No underlying skull fracture. No radiopaque foreign object. The brain shows generalized atrophy. Again demonstrated is a subdural hematoma along the convexity on the left which appears mixed age. There are definitely hyperdense components. Maximal thickness  in the middle cranial fossa is 1 cm. Maximal thickness over the left parietal region is 6 mm. No midline shift mild chronic small-vessel ischemic changes affect the white matter. CT CERVICAL SPINE FINDINGS Alignment is normal. No fracture. There is chronic degenerative spondylosis at C5-6 and C6-7 but no significant stenosis. Ordinary osteoarthritis of the C1-2 articulation. IMPRESSION: Mixed age subdural hematoma on the left, which was present on the study of 06/18/2015. There are mixed density components, but definitely with recent hyperdense components. Maximal thickness in the middle cranial fossa region is 10 mm. Maximal thickness in the parietal region is 6 mm. No midline shift. No skull fracture. Ordinary mild degenerative changes of the cervical spine. No traumatic finding. Critical Value/emergent results were called by telephone at the time of interpretation on 08/07/2015 at 11:45 pm to Dr. Mancel Bale , who verbally acknowledged these results. Electronically Signed   By: Paulina Fusi M.D.   On: 08/07/2015 23:49   Pelvis Portable  08/09/2015  CLINICAL DATA:  Hip fracture post operative repair EXAM: PORTABLE PELVIS 1-2 VIEWS COMPARISON:  Portable exam 0957 hours compared to 08/07/2015 FINDINGS: Interval resection of RIGHT femoral head/neck. RIGHT hip prosthesis now identified without fracture or dislocation. Bones demineralized. Penile prosthesis noted. Visualized pelvis unremarkable. Scattered atherosclerotic calcifications. IMPRESSION: RIGHT hip prosthesis without acute complication. Electronically Signed   By: Ulyses Southward M.D.   On: 08/09/2015 10:04   Dg Chest Port 1 View  08/12/2015  CLINICAL DATA:  Acute onset of productive cough and  chest congestion three days ago. Postop day 3 right hip arthroplasty. EXAM: PORTABLE CHEST 1 VIEW COMPARISON:  08/07/2015, 10/31/2014 and earlier. FINDINGS: Suboptimal inspiration accounts for crowded bronchovascular markings in the bases, and accentuates the cardiac  silhouette. Taking this into account, cardiac silhouette normal in size, unchanged. Left subclavian dual lead transvenous pacemaker unchanged. Moderate central peribronchial thickening and prominent bronchovascular markings diffusely, more so than on the prior examinations. No confluent airspace consolidation. No pleural effusions. IMPRESSION: Suboptimal inspiration. Moderate changes of acute bronchitis and/or asthma without focal airspace pneumonia. Electronically Signed   By: Hulan Saas M.D.   On: 08/12/2015 10:08   Dg Hip Unilat With Pelvis 2-3 Views Right  08/07/2015  CLINICAL DATA:  Unwitnessed fall 2 hours ago. Dementia. Pain and deformity. EXAM: DG HIP (WITH OR WITHOUT PELVIS) 2-3V RIGHT COMPARISON:  None. FINDINGS: Angulated femoral neck fracture on the right. No pelvic fracture. Left hip intact. IMPRESSION: Angulated femoral neck fracture on the right. Electronically Signed   By: Paulina Fusi M.D.   On: 08/07/2015 23:27    Assessment/Plan  Physical deconditioning With generalized weakness. Will have patient work with PT/OT as tolerated to regain strength and restore function.  Fall precautions are in place.  Closed right hip fracture S/p right hip hemiarthroplasty. Not on anticoagulation and aspirin with his subdural hematoma. To provide ted hose. Will have him work with physical therapy and occupational therapy team to help with gait training and muscle strengthening exercises.fall precautions. Skin care. Encourage to be out of bed. Continue norco 5-325 mg q6h prn pain and add tylenol 1000 mg bid. Continue prn tylenol order  Subdural hematoma Avoid antiplatelet drug and anticoagulation. Fall precautions. Monitor clinically.   Failure to thrive With fall, recent fracture, dementia and poor nutrition, get palliative care consult. Goal of care is to focus on comfort care. Patient is DNR. Reviewed care plan with daughter and son  Hyponatremia Monitor bmp and maintain  hydration  Blood loss anemia Post op, check cbc  Thrombocytopenia No bleed reported, monitor cbc  Protein calorie malnutrition Monitor weight, get RD consult, decline anticipated. Continue remeron  Bronchitis Completed his antibiotic therapy. Add incentive spirometer and duoneb bid x 5 days. Then tid prn for dyspnea and add o2 2l/min for hypoxia. Aspiration precautions. SLP to evaluate  UTI Complete his antibiotic treatment. Maintain hydration and perineal hygiene  Depression Continue remeron 15 mg daily and zoloft 100 mg daily  HLD Continue lipitor  Constipation Continue miralax daily as needed  ckd stage 3 Monitor bmp  Dementia Progressed with decline anticipated. Continue namenda and aricept with total care. Assistance with feeding. SLP consult  HTN Monitor bp, continue coreg 3.125 mg bid  DM Lab Results  Component Value Date   HGBA1C 8.0* 11/01/2014   Check a1c. Monitor cbg bid and continue lantus 35 u daily   Goals of care: short term rehabilitation   Labs/tests ordered: cbc, cmp, a1c 08/18/15  Family/ staff Communication: reviewed care plan with patient and nursing supervisor    Oneal Grout, MD Internal Medicine [facility] [address] [place], [place] [address] Cell Phone (Monday-Friday 8 am - 5 pm): [phone] On Call: [phone] and follow prompts after 5 pm and on weekends Office Phone: [phone] Office Fax: [phone]

## 2015-08-16 ENCOUNTER — Emergency Department (HOSPITAL_COMMUNITY): Payer: Medicare Other

## 2015-08-16 ENCOUNTER — Encounter (HOSPITAL_COMMUNITY): Payer: Self-pay

## 2015-08-16 ENCOUNTER — Emergency Department (HOSPITAL_COMMUNITY)
Admission: EM | Admit: 2015-08-16 | Discharge: 2015-08-16 | Disposition: A | Discharge status: Home / Self Care | Payer: Medicare Other | Attending: Emergency Medicine | Admitting: Emergency Medicine

## 2015-08-16 DIAGNOSIS — T17320A Food in larynx causing asphyxiation, initial encounter: Secondary | ICD-10-CM

## 2015-08-16 DIAGNOSIS — T17308A Unspecified foreign body in larynx causing other injury, initial encounter: Secondary | ICD-10-CM | POA: Diagnosis not present

## 2015-08-16 DIAGNOSIS — I5042 Chronic combined systolic (congestive) and diastolic (congestive) heart failure: Secondary | ICD-10-CM | POA: Diagnosis not present

## 2015-08-16 DIAGNOSIS — R05 Cough: Secondary | ICD-10-CM | POA: Diagnosis not present

## 2015-08-16 DIAGNOSIS — X58XXXA Exposure to other specified factors, initial encounter: Secondary | ICD-10-CM | POA: Insufficient documentation

## 2015-08-16 DIAGNOSIS — I110 Hypertensive heart disease with heart failure: Secondary | ICD-10-CM | POA: Diagnosis not present

## 2015-08-16 DIAGNOSIS — E782 Mixed hyperlipidemia: Secondary | ICD-10-CM | POA: Insufficient documentation

## 2015-08-16 DIAGNOSIS — Y939 Activity, unspecified: Secondary | ICD-10-CM | POA: Insufficient documentation

## 2015-08-16 DIAGNOSIS — E1151 Type 2 diabetes mellitus with diabetic peripheral angiopathy without gangrene: Secondary | ICD-10-CM | POA: Diagnosis not present

## 2015-08-16 DIAGNOSIS — E1129 Type 2 diabetes mellitus with other diabetic kidney complication: Secondary | ICD-10-CM | POA: Insufficient documentation

## 2015-08-16 DIAGNOSIS — Y929 Unspecified place or not applicable: Secondary | ICD-10-CM | POA: Insufficient documentation

## 2015-08-16 DIAGNOSIS — Y999 Unspecified external cause status: Secondary | ICD-10-CM | POA: Insufficient documentation

## 2015-08-16 DIAGNOSIS — J45909 Unspecified asthma, uncomplicated: Secondary | ICD-10-CM | POA: Insufficient documentation

## 2015-08-16 DIAGNOSIS — Z87891 Personal history of nicotine dependence: Secondary | ICD-10-CM | POA: Insufficient documentation

## 2015-08-16 DIAGNOSIS — Z950 Presence of cardiac pacemaker: Secondary | ICD-10-CM | POA: Insufficient documentation

## 2015-08-16 NOTE — Discharge Instructions (Signed)
Choking, Adult °Choking occurs when a food or object gets stuck in the throat or trachea, blocking the airway. If the airway is partly blocked, coughing will usually cause the food or object to come out. If the airway is completely blocked, immediate action is needed to help it come out. A complete airway blockage is life threatening because it causes breathing to stop. Choking is a true medical emergency that requires fast, appropriate action by anyone available. °SIGNS OF AIRWAY BLOCKAGE °There is a partial airway blockage if you or the person who is choking is:  °· Able to breathe and speak. °· Coughing loudly. °· Making loud noises. °There is a complete airway blockage if you or the person who is choking is:  °· Unable to breathe. °· Making soft or high-pitched sounds while breathing. °· Unable to cough or coughing weakly, ineffectively, or silently. °· Unable to cry, speak, or make sounds. °· Turning blue. °· Holding the neck with both arms. This is the universal sign of choking. °WHAT TO DO IF CHOKING OCCURS °If there is a partial airway blockage, allow coughing to clear the airway. Do not try to drink until the food or object comes out. If someone else has a partial airway blockage, do not interfere. Stay with him or her and watch for signs of complete airway blockage until the food or object comes out.  °If there is a complete airway blockage or if there is a partial airway blockage and the food or object does not come out, perform abdominal thrusts (also referred to as the Heimlich maneuver). Abdominal thrusts are used to create an artificial cough to try to clear the airway. Performing abdominal thrusts is part of a series of steps that should be done to help someone who is choking. Abdominal thrusts are usually done by someone else, but if you are alone, you can perform abdominal thrusts on yourself. Follow the procedure below that best fits your situation.  °IF SOMEONE ELSE IS CHOKING: °For a conscious  adult:  °1. Ask the person whether he or she is choking. If the person nods, continue to step 2. °2. Stand or kneel behind the person and lean him or her forward slightly. °3. Make a fist with 1 hand, put your arms around the person, and grasp your fist with your other hand. Place the thumb side of your fist in the person's abdomen, just below the ribs. °4. Press inward and upward with both hands. °5. Repeat this maneuver until the object comes out and the person is able to breathe or until the person loses consciousness. °For an unconscious adult: °1. Shout for help. If someone responds, have him or her call local emergency services (911 in [place] If no one responds, call local emergency services yourself if possible. °2. Begin CPR, starting with compressions. Every time you open the airway to give rescue breaths, open the person's mouth. If you can see the food or object and it can be easily pulled out, remove it with your fingers. °3. After 5 cycles or 2 minutes of CPR, call local emergency services (911 in [place] if you or someone else did not already call. °For a conscious adult who is obese or in the later stages of pregnancy: °Abdominal thrusts may not be effective when helping people who are in the later stages of pregnancy or who are obese. In these instances, chest thrusts can be used.  °1. Ask the person whether he or she is choking. If the person   nods and has signs of complete airway blockage, continue to step 2. °2. Stand behind the person and wrap your arms around his or her chest (with your arms under the person's armpits). °3. Make a fist with 1 hand. Place the thumb side of your fist on the middle of the person's breastbone. °4. Grab your fist with your other hand and thrust backward. Continue this until the object comes out or until the person becomes unconscious. °For an unconscious adult who is obese or in the later stages of pregnancy:  °1. Shout for help. If someone responds, have him or her  call local emergency services (911 in [place] If no one responds, call local emergency services yourself if possible. °2. Begin CPR, starting with compressions. Every time you open the airway to give rescue breaths, open the person's mouth. If you can see the food or object and it can be easily pulled out, remove it with your fingers. °3. After 5 cycles or 2 minutes of CPR, call local emergency services (911 in [place] if you or someone else did not already call. °Note that abdominal thrusts (below the rib cage) should be used for a pregnant woman if possible. This should be possible until the later stages of pregnancy when there is no longer enough room between the enlarging uterus and the rib cage to perform the maneuver. At that point, chest thrusts must be used as described. °IF YOU ARE CHOKING: °1. Call local emergency services (911 in [place] if near a landline. Do not worry about communicating what is happening. Do not hang up the phone. Someone may be sent to help you anyway. °2. Make a fist with 1 hand. Put the thumb side of the fist against your stomach, just above the belly button and well below the breastbone. If you are pregnant or obese, put your fist on your chest instead, just below the breastbone and just above your lowest ribs. °3. Hold your fist with your other hand and bend over a hard surface, such as a table or chair. °4. Forcefully push your fist in and up. °5. Continue to do this until the food or object comes out. °PREVENTION  °To be prepared if choking occurs, learn how to correctly perform abdominal thrusts and give CPR by taking a certified first-aid training course.  °SEEK IMMEDIATE MEDICAL CARE IF: °· You have a fever after choking stops. °· You have problems breathing after choking stops. °· You received the Heimlich maneuver. °MAKE SURE YOU: °· Understand these instructions. °· Watch your condition. °· Get help right away if you are not doing well or get worse. °  °This information is not  intended to replace advice given to you by your health care provider. Make sure you discuss any questions you have with your health care provider. °  °Document Released: 02/26/2004 Document Revised: 02/08/2014 Document Reviewed: 08/31/2011 °Elsevier Interactive Patient Education ©2016 [employer] ° °

## 2015-08-16 NOTE — ED Notes (Signed)
[facility] called for transport.  

## 2015-08-16 NOTE — ED Notes (Addendum)
Per EMS, [facility], c/o cough r/t choking on a corn dog earlier.  Denies pain.  Family reports that the Pt got choked, but was able to cough up the food.  NAD noted. Voice is clear.  Hx of dementia.

## 2015-08-16 NOTE — ED Provider Notes (Signed)
CSN: [id number]     Arrival date & time 08/16/15  1850 History   First MD Initiated Contact with Patient 08/16/15 1904     Chief Complaint  Patient presents with  . Cough      HPI Per EMS, [facility], c/o cough r/t choking on a corn dog earlier. Denies pain. Family reports that the Pt got choked, but was able to cough up the food. NAD noted. Voice is clear. Hx of dementia. Past Medical History  Diagnosis Date  . Type II or unspecified type diabetes mellitus without mention of complication, uncontrolled   . Essential hypertension   . Mental disorder   . Dementia   . Peripheral neuropathy (HCC)   . Allergic rhinitis   . Asthma with bronchitis   . Complete heart block (HCC)     a. Requiring permanent [employer] DDD pacemaker by Dr. Ladona Ridgelaylor in 2013.  Marland Kitchen. Peripheral vascular disease, unspecified (HCC)   . Cardiac pacemaker in situ   . Mixed hyperlipidemia   . Chronic combined systolic and diastolic CHF (congestive heart failure) (HCC)     a. EF noted to be down 10/2014 - 2D echo 10/17/14: EF 35-40%, mild LVH,  moderate HK of mid-apical anteroseptal and anterior myocardium, HK of inferior myocardium, grade 1 DD, mild MR.  . Diabetes mellitus with renal complications (HCC)   . Dementia   . BPH (benign prostatic hyperplasia)   . General weakness   . Anemia of chronic disease   . Thrombocytopenia [facility](HCC)    Past Surgical History  Procedure Laterality Date  . Rotator cuff repair    . Pacemaker insertion  Dec 2013  . Permanent pacemaker insertion N/A 01/31/2012    Procedure: PERMANENT PACEMAKER INSERTION;  Surgeon: Marinus MawGregg W Taylor, MD;  [facility];  Service: Cardiovascular;  Laterality: N/A;  . Hip arthroplasty Right 08/09/2015    Procedure: HIP HEMIARTHROPLASTY POSTERIOR;  Surgeon: Eldred MangesMark C Yates, MD;  Location: [facility] OR;  Service: Orthopedics;  Laterality: Right;   Family History  Problem Relation Age of Onset  . Heart disease Mother   . Alzheimer's disease  Father   . Diabetes Sister   . COPD Brother   . Alzheimer's disease Sister    Social History  Substance Use Topics  . Smoking status: Former Smoker    Types: Cigarettes  . Smokeless tobacco: Never Used  . Alcohol Use: No    Review of Systems  Unable to perform ROS: Dementia      Allergies  Actos and Ramipril  Home Medications   Prior to Admission medications   Medication Sig Start Date End Date Taking? Authorizing Provider  acetaminophen (TYLENOL) 325 MG tablet Take 2 tablets (650 mg total) by mouth every 6 (six) hours as needed for mild pain (or Fever >/= 101). 08/14/15   Zannie CovePreetha Joseph, MD  atorvastatin (LIPITOR) 20 MG tablet Take 20 mg by mouth daily.    Historical Provider, MD  carvedilol (COREG) 3.125 MG tablet TAKE 1 TABLET BY MOUTH TWICE DAILY 06/23/15   Lyn RecordsHenry W Smith, MD  donepezil (ARICEPT) 10 MG tablet Take 1 tablet (10 mg total) by mouth at bedtime. 02/19/13   Elenora GammaSamuel L Bradshaw, MD  finasteride (PROSCAR) 5 MG tablet TAKE 1 TABLET BY MOUTH EVERY DAY    Elenora GammaSamuel L Bradshaw, MD  glucose blood test strip Once daily 04/16/13   Elenora GammaSamuel L Bradshaw, MD  HYDROcodone-acetaminophen (NORCO) 5-325 MG tablet Take 1 tablet by mouth every 6 (  six) hours as needed for moderate pain. 08/11/15   Eldred Manges, MD  insulin glargine (LANTUS) 100 UNIT/ML injection Inject 0.35 mLs (35 Units total) into the skin at bedtime. 08/14/15   Zannie Cove, MD  memantine (NAMENDA) 10 MG tablet TAKE 1 TABLET BY MOUTH EVERY NIGHT AT BEDTIME 06/23/15   Kathee Delton, MD  mirtazapine (REMERON) 15 MG tablet Take 15 mg by mouth at bedtime.     Historical Provider, MD  [facility] DELICA LANCETS 33G MISC 1 Device by Does not apply route 4 (four) times daily as needed. 02/19/13   Elenora Gamma, MD  polyethylene glycol [facility] / Ethelene Hal) packet Take 17 g by mouth daily as needed for mild constipation. 08/14/15   Zannie Cove, MD  sertraline (ZOLOFT) 100 MG tablet Take 1 tablet (100 mg total) by mouth at bedtime.  02/19/13   Elenora Gamma, MD   BP 119/59 mmHg  Pulse 85  Temp(Src) 97.9 F (36.6 C) (Oral)  Resp 18  SpO2 95% Physical Exam  Constitutional: He appears well-developed and well-nourished. No distress.  HENT:  Head: Normocephalic and atraumatic.  Eyes: Pupils are equal, round, and reactive to light.  Neck: Normal range of motion.  Cardiovascular: Normal rate and intact distal pulses.   Pulmonary/Chest: Effort normal. No respiratory distress.  Abdominal: Normal appearance. He exhibits no distension. There is no tenderness.  Musculoskeletal: Normal range of motion.  Neurological: He is alert. No cranial nerve deficit.  Skin: Skin is warm and dry. No rash noted.  Nursing note and vitals reviewed.   ED Course  Procedures (including critical care time) Labs Review Labs Reviewed - No data to display  Imaging Review Dg Chest 2 View  08/16/2015  CLINICAL DATA:  82 year old male with cough EXAM: CHEST  2 VIEW COMPARISON:  chest radiograph dated 08/12/2015 FINDINGS: Two views of the chest do not demonstrate a focal consolidation. There is no pleural effusion or pneumothorax. The cardiac silhouette is within normal limits. Left pectoral pacemaker device. No acute osseous pathology. IMPRESSION: No active cardiopulmonary disease. Electronically Signed   By: Elgie Collard M.D.   On: 08/16/2015 19:43   I have personally reviewed and evaluated these images and lab results as part of my medical decision-making.    MDM   Final diagnoses:  Choking due to food in larynx, initial encounter        Nelva Nay, MD 08/16/15 1956

## 2015-08-20 DIAGNOSIS — F0390 Unspecified dementia without behavioral disturbance: Secondary | ICD-10-CM | POA: Diagnosis not present

## 2015-08-20 DIAGNOSIS — M899 Disorder of bone, unspecified: Secondary | ICD-10-CM | POA: Diagnosis not present

## 2015-08-20 DIAGNOSIS — Z5189 Encounter for other specified aftercare: Secondary | ICD-10-CM | POA: Diagnosis not present

## 2015-08-20 DIAGNOSIS — M25551 Pain in right hip: Secondary | ICD-10-CM | POA: Diagnosis not present

## 2015-08-20 DIAGNOSIS — M6281 Muscle weakness (generalized): Secondary | ICD-10-CM | POA: Diagnosis not present

## 2015-08-20 DIAGNOSIS — R2681 Unsteadiness on feet: Secondary | ICD-10-CM | POA: Diagnosis not present

## 2015-08-20 DIAGNOSIS — R600 Localized edema: Secondary | ICD-10-CM | POA: Diagnosis not present

## 2015-08-20 DIAGNOSIS — R262 Difficulty in walking, not elsewhere classified: Secondary | ICD-10-CM | POA: Diagnosis not present

## 2015-08-21 DIAGNOSIS — R262 Difficulty in walking, not elsewhere classified: Secondary | ICD-10-CM | POA: Diagnosis not present

## 2015-08-21 DIAGNOSIS — R2681 Unsteadiness on feet: Secondary | ICD-10-CM | POA: Diagnosis not present

## 2015-08-21 DIAGNOSIS — Z5189 Encounter for other specified aftercare: Secondary | ICD-10-CM | POA: Diagnosis not present

## 2015-08-21 DIAGNOSIS — R600 Localized edema: Secondary | ICD-10-CM | POA: Diagnosis not present

## 2015-08-21 DIAGNOSIS — M6281 Muscle weakness (generalized): Secondary | ICD-10-CM | POA: Diagnosis not present

## 2015-08-21 DIAGNOSIS — M25551 Pain in right hip: Secondary | ICD-10-CM | POA: Diagnosis not present

## 2015-08-21 DIAGNOSIS — F0390 Unspecified dementia without behavioral disturbance: Secondary | ICD-10-CM | POA: Diagnosis not present

## 2015-08-22 DIAGNOSIS — R600 Localized edema: Secondary | ICD-10-CM | POA: Diagnosis not present

## 2015-08-22 DIAGNOSIS — R2681 Unsteadiness on feet: Secondary | ICD-10-CM | POA: Diagnosis not present

## 2015-08-22 DIAGNOSIS — R262 Difficulty in walking, not elsewhere classified: Secondary | ICD-10-CM | POA: Diagnosis not present

## 2015-08-22 DIAGNOSIS — M6281 Muscle weakness (generalized): Secondary | ICD-10-CM | POA: Diagnosis not present

## 2015-08-22 DIAGNOSIS — Z5189 Encounter for other specified aftercare: Secondary | ICD-10-CM | POA: Diagnosis not present

## 2015-08-22 DIAGNOSIS — M25551 Pain in right hip: Secondary | ICD-10-CM | POA: Diagnosis not present

## 2015-08-22 DIAGNOSIS — F0390 Unspecified dementia without behavioral disturbance: Secondary | ICD-10-CM | POA: Diagnosis not present

## 2015-08-26 DIAGNOSIS — M6281 Muscle weakness (generalized): Secondary | ICD-10-CM | POA: Diagnosis not present

## 2015-08-26 DIAGNOSIS — F0390 Unspecified dementia without behavioral disturbance: Secondary | ICD-10-CM | POA: Diagnosis not present

## 2015-08-26 DIAGNOSIS — M25551 Pain in right hip: Secondary | ICD-10-CM | POA: Diagnosis not present

## 2015-08-26 DIAGNOSIS — R600 Localized edema: Secondary | ICD-10-CM | POA: Diagnosis not present

## 2015-08-26 DIAGNOSIS — Z5189 Encounter for other specified aftercare: Secondary | ICD-10-CM | POA: Diagnosis not present

## 2015-08-26 DIAGNOSIS — R262 Difficulty in walking, not elsewhere classified: Secondary | ICD-10-CM | POA: Diagnosis not present

## 2015-08-26 DIAGNOSIS — R2681 Unsteadiness on feet: Secondary | ICD-10-CM | POA: Diagnosis not present

## 2015-08-27 DIAGNOSIS — F0390 Unspecified dementia without behavioral disturbance: Secondary | ICD-10-CM | POA: Diagnosis not present

## 2015-08-27 DIAGNOSIS — R600 Localized edema: Secondary | ICD-10-CM | POA: Diagnosis not present

## 2015-08-27 DIAGNOSIS — Z5189 Encounter for other specified aftercare: Secondary | ICD-10-CM | POA: Diagnosis not present

## 2015-08-27 DIAGNOSIS — R262 Difficulty in walking, not elsewhere classified: Secondary | ICD-10-CM | POA: Diagnosis not present

## 2015-08-27 DIAGNOSIS — M6281 Muscle weakness (generalized): Secondary | ICD-10-CM | POA: Diagnosis not present

## 2015-08-27 DIAGNOSIS — M25551 Pain in right hip: Secondary | ICD-10-CM | POA: Diagnosis not present

## 2015-08-27 DIAGNOSIS — R2681 Unsteadiness on feet: Secondary | ICD-10-CM | POA: Diagnosis not present

## 2015-08-28 DIAGNOSIS — S72041D Displaced fracture of base of neck of right femur, subsequent encounter for closed fracture with routine healing: Secondary | ICD-10-CM | POA: Diagnosis not present

## 2015-08-28 DIAGNOSIS — R600 Localized edema: Secondary | ICD-10-CM | POA: Diagnosis not present

## 2015-08-28 DIAGNOSIS — M25551 Pain in right hip: Secondary | ICD-10-CM | POA: Diagnosis not present

## 2015-08-28 DIAGNOSIS — Z5189 Encounter for other specified aftercare: Secondary | ICD-10-CM | POA: Diagnosis not present

## 2015-08-28 DIAGNOSIS — M6281 Muscle weakness (generalized): Secondary | ICD-10-CM | POA: Diagnosis not present

## 2015-08-28 DIAGNOSIS — R2681 Unsteadiness on feet: Secondary | ICD-10-CM | POA: Diagnosis not present

## 2015-08-28 DIAGNOSIS — R262 Difficulty in walking, not elsewhere classified: Secondary | ICD-10-CM | POA: Diagnosis not present

## 2015-08-28 DIAGNOSIS — F0390 Unspecified dementia without behavioral disturbance: Secondary | ICD-10-CM | POA: Diagnosis not present

## 2015-09-11 ENCOUNTER — Encounter: Payer: Self-pay | Admitting: Adult Health

## 2015-09-11 ENCOUNTER — Non-Acute Institutional Stay (SKILLED_NURSING_FACILITY): Payer: Medicare Other | Admitting: Adult Health

## 2015-09-11 DIAGNOSIS — F0390 Unspecified dementia without behavioral disturbance: Secondary | ICD-10-CM | POA: Diagnosis not present

## 2015-09-11 DIAGNOSIS — K5900 Constipation, unspecified: Secondary | ICD-10-CM

## 2015-09-11 DIAGNOSIS — I6200 Nontraumatic subdural hemorrhage, unspecified: Secondary | ICD-10-CM

## 2015-09-11 DIAGNOSIS — S72001S Fracture of unspecified part of neck of right femur, sequela: Secondary | ICD-10-CM

## 2015-09-11 DIAGNOSIS — IMO0002 Reserved for concepts with insufficient information to code with codable children: Secondary | ICD-10-CM

## 2015-09-11 DIAGNOSIS — D62 Acute posthemorrhagic anemia: Secondary | ICD-10-CM | POA: Diagnosis not present

## 2015-09-11 DIAGNOSIS — Z794 Long term (current) use of insulin: Secondary | ICD-10-CM

## 2015-09-11 DIAGNOSIS — E785 Hyperlipidemia, unspecified: Secondary | ICD-10-CM | POA: Diagnosis not present

## 2015-09-11 DIAGNOSIS — R5381 Other malaise: Secondary | ICD-10-CM | POA: Diagnosis not present

## 2015-09-11 DIAGNOSIS — F339 Major depressive disorder, recurrent, unspecified: Secondary | ICD-10-CM | POA: Diagnosis not present

## 2015-09-11 DIAGNOSIS — E1122 Type 2 diabetes mellitus with diabetic chronic kidney disease: Secondary | ICD-10-CM

## 2015-09-11 DIAGNOSIS — I10 Essential (primary) hypertension: Secondary | ICD-10-CM | POA: Diagnosis not present

## 2015-09-11 DIAGNOSIS — N183 Chronic kidney disease, stage 3 unspecified: Secondary | ICD-10-CM

## 2015-09-11 DIAGNOSIS — S065XAA Traumatic subdural hemorrhage with loss of consciousness status unknown, initial encounter: Secondary | ICD-10-CM

## 2015-09-11 DIAGNOSIS — E1165 Type 2 diabetes mellitus with hyperglycemia: Secondary | ICD-10-CM

## 2015-09-11 DIAGNOSIS — N400 Enlarged prostate without lower urinary tract symptoms: Secondary | ICD-10-CM | POA: Diagnosis not present

## 2015-09-11 DIAGNOSIS — S065X9A Traumatic subdural hemorrhage with loss of consciousness of unspecified duration, initial encounter: Secondary | ICD-10-CM

## 2015-09-11 NOTE — Progress Notes (Signed)
Patient ID: Dakota White, male   DOB: [birth year], 82 y.o.   MRN: [id number]    DATE:  09/11/2015   MRN:  [id number]  BIRTHDAY: 03-Apr-1928  Facility:  Nursing Home Location:  [facility]  Nursing Home Room Number: 703-P  LEVEL OF CARE:  SNF (31)  Contact Information    Name Relation Home Work Mobile   Gautney-Lewis,[name] Daughter [phone]     Nolen, Lindamood [phone]     Kenry, Daubert   [phone]       Code Status History    Date Active Date Inactive Code Status Order ID Comments User Context   08/08/2015 11:41 AM 08/14/2015  6:08 PM DNR [id number]  Eldred Manges, MD Inpatient   08/08/2015  2:07 AM 08/08/2015 11:41 AM DNR [id number]  Eduard Clos, MD ED   11/01/2014  2:05 AM 11/04/2014  6:16 PM Full Code [id number]  Alberteen Sam, MD Inpatient   10/22/2014  4:41 AM 10/24/2014  6:34 PM Full Code [id number]  Hillary Bow, DO ED   10/16/2014  5:45 PM 10/17/2014  7:15 PM Full Code [id number]  Richarda Overlie, MD ED    Questions for Most Recent Historical Code Status (Order [id number])    Question Answer Comment   In the event of cardiac or respiratory ARREST Do not call a "code blue"    In the event of cardiac or respiratory ARREST Do not perform Intubation, CPR, defibrillation or ACLS    In the event of cardiac or respiratory ARREST Use medication by any route, position, wound care, and other measures to relive pain and suffering. May use oxygen, suction and manual treatment of airway obstruction as needed for comfort.         Advance Directive Documentation   Flowsheet Row Most Recent Value  Type of Advance Directive  Out of facility DNR (pink MOST or yellow form), Living will  Pre-existing out of facility DNR order (yellow form or pink MOST form)  No data  "MOST" Form in Place?  No data       Chief Complaint  Patient presents with  . Medical Management of Chronic Issues    HISTORY OF PRESENT ILLNESS:  This is an 82 year old male  who is being seen for a routine visit. He is currently having short-term rehabilitation @ [facility]. Doppler study was done on RLE and was negative for DVT.  He has been admitted to [facility] on 08/14/15 from [facility] with closed right hip fracture post fall and subdural hematoma. He had right hip hemiarthroplasty on 08/09/15. He was treated as well for acute bronchitis and Klebsiella UTI. Neurosurgery was consulted and advised conservative management.  PAST MEDICAL HISTORY:  Past Medical History:  Diagnosis Date  . Allergic rhinitis   . Anemia of chronic disease   . Asthma with bronchitis   . BPH (benign prostatic hyperplasia)   . Cardiac pacemaker in situ   . Chronic combined systolic and diastolic CHF (congestive heart failure) (HCC)    a. EF noted to be down 10/2014 - 2D echo 10/17/14: EF 35-40%, mild LVH,  moderate HK of mid-apical anteroseptal and anterior myocardium, HK of inferior myocardium, grade 1 DD, mild MR.  . Complete heart block (HCC)    a. Requiring permanent St. Jude DDD pacemaker by Dr. Ladona Ridgel in 2013.  Marland Kitchen Dementia   . Diabetes mellitus with renal complications (HCC)   . Essential hypertension   . General weakness   .  Mental disorder   . Mixed hyperlipidemia   . Peripheral neuropathy (HCC)   . Peripheral vascular disease, unspecified (HCC)   . Thrombocytopenia (HCC)   . Type II or unspecified type diabetes mellitus without mention of complication, uncontrolled      CURRENT MEDICATIONS: Reviewed  Patient's Medications  New Prescriptions   No medications on file  Previous Medications   ACETAMINOPHEN (TYLENOL) 325 MG TABLET    Take 2 tablets (650 mg total) by mouth every 6 (six) hours as needed for mild pain (or Fever >/= 101).   ACETAMINOPHEN (TYLENOL) 500 MG TABLET    Take 1,000 mg by mouth 2 (two) times daily.   ATORVASTATIN (LIPITOR) 20 MG TABLET    Take 20 mg by mouth daily.   CARVEDILOL (COREG) 3.125 MG TABLET    TAKE 1 TABLET BY MOUTH TWICE  DAILY   DONEPEZIL (ARICEPT) 10 MG TABLET    Take 1 tablet (10 mg total) by mouth at bedtime.   FINASTERIDE (PROSCAR) 5 MG TABLET    TAKE 1 TABLET BY MOUTH EVERY DAY   GLUCOSE BLOOD TEST STRIP    Once daily   HYDROCODONE-ACETAMINOPHEN (NORCO) 5-325 MG TABLET    Take 1 tablet by mouth every 6 (six) hours as needed for moderate pain.   INSULIN GLARGINE (LANTUS) 100 UNIT/ML INJECTION    Inject 0.35 mLs (35 Units total) into the skin at bedtime.   IPRATROPIUM-ALBUTEROL (DUONEB) 0.5-2.5 (3) MG/3ML SOLN    Take 3 mLs by nebulization. BID x5 days, then TID PRN for dyspnea/wheezing   MEMANTINE (NAMENDA) 10 MG TABLET    TAKE 1 TABLET BY MOUTH EVERY NIGHT AT BEDTIME   MIRTAZAPINE (REMERON) 15 MG TABLET    Take 15 mg by mouth at bedtime.    ONETOUCH DELICA LANCETS 33G MISC    1 Device by Does not apply route 4 (four) times daily as needed.   POLYETHYLENE GLYCOL (MIRALAX / GLYCOLAX) PACKET    Take 17 g by mouth daily as needed for mild constipation.   SERTRALINE (ZOLOFT) 100 MG TABLET    Take 1 tablet (100 mg total) by mouth at bedtime.  Modified Medications   No medications on file  Discontinued Medications   No medications on file     Allergies  Allergen Reactions  . Actos [Pioglitazone] Other (See Comments)    "makes me feel lousy and makes me bleed"  . Ramipril Other (See Comments)    "pt not aware of allergy"     REVIEW OF SYSTEMS:    GENERAL: no change in appetite, no fatigue, no weight changes, no fever, chills or weakness SKIN: Denies rash, itching, wounds, ulcer sores, or nail abnormality EYES: Denies change in vision, dry eyes, eye pain, itching or discharge EARS: Denies change in hearing, ringing in ears, or earache NOSE: Denies nasal congestion or epistaxis MOUTH and THROAT: Denies oral discomfort, gingival pain or bleeding, pain from teeth or hoarseness   RESPIRATORY: no cough, SOB, DOE, wheezing, hemoptysis CARDIAC: no chest pain, edema or palpitations GI: no abdominal pain,  diarrhea, constipation, heart burn, nausea or vomiting GU: Denies dysuria, frequency, hematuria, incontinence, or discharge PSYCHIATRIC: Denies feeling of depression or anxiety. No report of hallucinations, insomnia, paranoia, or agitation   PHYSICAL EXAMINATION  GENERAL APPEARANCE: Well nourished. In no acute distress. Normal body habitus SKIN:  Right hip surgical incision is healed HEAD: Normal in size and contour. No evidence of trauma EYES: Lids open and close normally. No blepharitis, entropion or ectropion.  PERRL. Conjunctivae are clear and sclerae are white. Lenses are without opacity EARS: Pinnae are normal. Patient hears normal voice tunes of the examiner MOUTH and THROAT: Lips are without lesions. Oral mucosa is moist and without lesions. Tongue is normal in shape, size, and color and without lesions NECK: supple, trachea midline, no neck masses, no thyroid tenderness, no thyromegaly LYMPHATICS: no LAN in the neck, no supraclavicular LAN RESPIRATORY: breathing is even & unlabored, BS CTAB CARDIAC: RRR, no murmur,no extra heart sounds, no edema GI: abdomen soft, normal BS, no masses, no tenderness, no hepatomegaly, no splenomegaly EXTREMITIES:  Able to move 4 extremities PSYCHIATRIC: Alert to self. Affect and behavior are appropriate  LABS/RADIOLOGY: Labs reviewed: Basic Metabolic Panel:  Recent Labs  16/10/96 1228 10/16/14 1808  08/08/15 1906 08/10/15 0637 08/11/15 0252  NA  --   --   < > 136 134* 133*  K  --   --   < > 4.2 4.2 4.2  CL  --   --   < > 106 105 104  CO2  --   --   < > 23 23 21*  GLUCOSE  --   --   < > 204* 234* 133*  BUN  --   --   < > 29* 37* 47*  CREATININE  --  1.50*  < > 1.64* 1.86* 1.89*  CALCIUM  --   --   < > 8.7* 8.1* 8.1*  MG 1.8 1.7  --   --   --   --   PHOS 2.8  --   --   --   --   --   < > = values in this interval not displayed. Liver Function Tests:  Recent Labs  10/21/14 2223 11/01/14 0230 08/08/15 1906  AST ALT ALKPHOS 54 60 76  BILITOT 0.7 0.6 0.7  PROT 7.7 6.6 7.2  ALBUMIN 4.2 3.5 3.6    Recent Labs  10/16/14 1214 10/31/14 2126  LIPASE 39 30    CBC:  Recent Labs  06/18/15 1140 08/07/15 2338 08/08/15 1906  08/11/15 0252 08/12/15 0234 08/13/15 0334  WBC 6.4 7.5 5.8  < > 4.2 4.4 5.4  NEUTROABS 4.4 6.0 4.5  --   --   --   --   HGB 11.5* 13.2 12.3*  < > 8.6* 8.5* 9.5*  HCT 34.4* 40.9 38.1*  < > 26.1* 27.1* 29.1*  MCV 91.5 92.5 94.1  < > 93.5 93.4 92.4  PLT 101* 148* 104*  < > 79* 106* 133*  < > = values in this interval not displayed.  Cardiac Enzymes:  Recent Labs  10/16/14 1808 10/17/14 0015 10/17/14 0528  TROPONINI <0.03 <0.03 <0.03    CBG:  Recent Labs  08/14/15 0406 08/14/15 0843 08/14/15 1122  GLUCAP 153* 83 201*     Dg Chest 2 View  Result Date: 08/16/2015 CLINICAL DATA:  82 year old male with cough EXAM: CHEST  2 VIEW COMPARISON:  chest radiograph dated 08/12/2015 FINDINGS: Two views of the chest do not demonstrate a focal consolidation. There is no pleural effusion or pneumothorax. The cardiac silhouette is within normal limits. Left pectoral pacemaker device. No acute osseous pathology. IMPRESSION: No active cardiopulmonary disease. Electronically Signed   By: Elgie Collard M.D.   On: 08/16/2015 19:43   Ct Head Wo Contrast  Result Date: 08/14/2015 CLINICAL DATA:  Subdural hematoma EXAM: CT HEAD WITHOUT CONTRAST TECHNIQUE: Contiguous axial images were obtained from  the base of the skull through the vertex without intravenous contrast. COMPARISON:  Head CT 08/13/2015 FINDINGS: Mixed density left convexity subdural hematoma measures 10 mm in thickness, unchanged. There is layering hyperdensity dependently. Mild mass effect on the left hemisphere and slight with midline shift measuring 3.5 mm is unchanged. There are no new areas of hemorrhage. No evidence of acute cortical infarct. The paranasal sinuses and mastoids are free of fluid. The orbits are  normal. Visualized nasopharynx is clear. IMPRESSION: Unchanged size of left convexity subdural hematoma with persistent rightward midline shift measuring 3 mm. Electronically Signed   By: Deatra Robinson M.D.   On: 08/14/2015 08:15   Ct Head Wo Contrast  Result Date: 08/13/2015 CLINICAL DATA:  82 year old male with left subdural hematoma since May. Initial encounter. EXAM: CT HEAD WITHOUT CONTRAST TECHNIQUE: Contiguous axial images were obtained from the base of the skull through the vertex without intravenous contrast. COMPARISON:  08/10/2015 and earlier. FINDINGS: Visualized paranasal sinuses and mastoids are stable and well pneumatized. No acute orbit or scalp soft tissue findings. Stable skin staples along the right vertex. Calvarium intact. No acute osseous abnormality identified. Mixed density the mostly low-density left side subdural hematoma measures up to 11 mm in thickness now, versus 8-9 mm previously. The subdural is largest along the posterior convexity. Anteriorly the hemorrhage measures more like 7-8 mm, stable. Rightward midline shift of 4 mm is increased (series 2, image 17). Mass effect on the left lateral ventricle is not significantly changed. No ventriculomegaly. No other extra-axial collection or new intracranial hemorrhage. Stable gray-white matter differentiation throughout the brain. No cortically based acute infarct identified. No suspicious intracranial vascular hyperdensity. Calcified atherosclerosis at the skull base. IMPRESSION: 1. Progressed left subdural hematoma since 08/10/2015. Mildly increased rightward midline shift of 4 mm. 2. No new intracranial abnormality. Electronically Signed   By: Odessa Fleming M.D.   On: 08/13/2015 09:56    ASSESSMENT/PLAN:  Physical deconditioning - continue rehabilitation, PT and OT; fall precaution  Closed right hip fracture S/P right hip hemiarthroplasty - continue rehabilitation, PT and OT; continue Norco 5/325 mg 1 tab by mouth every 6 hours when  necessary and Tylenol 325 mg 2 tabs = 650 mg by mouth every 6 hours when necessary and Tylenol ES 500 mg take 2 tabs = 1,000 mg PO BID for pain; follow-up with orthopedic surgeon, Dr. Ophelia Charter  Subdural hematoma - continue to avoid antiplatelets/anticoagulation; fall precaution  Depression - mood is stable; continue Zoloft 100 mg 1 tab by mouth daily at bedtime and Remeron 15 mg 1 tab by mouth daily at bedtime  Anemia, acute blood loss - check CBC Lab Results  Component Value Date   HGB 9.5 (L) 08/13/2015   Hyperlipidemia - continue Lipitor 20 mg 1 tab by mouth daily; check lipid panel  Hypertension - well controlled; continue Coreg 3.125 mg 1 tab by mouth twice a day  Dementia - continue Namenda 10 mg 1 tab by mouth daily at bedtime and Aricept 10 mg 1 tab by mouth daily at bedtime; fall precaution and continue supportive care  Diabetes mellitus, type II - continue Lantus 100 units/mL injected 35 units subcutaneous U evening Lab Results  Component Value Date   HGBA1C 8.0 (H) 11/01/2014   BPH - continue Proscar 5 mg 1 tab by mouth daily  Constipation - continue MiraLAX 17 g by mouth daily when necessary  Chronic kidney disease, stage III - check BMP Lab Results  Component Value Date   CREATININE 1.89 (  H) 08/11/2015      Goals of care:  Short-term rehabilitation    Kenard Gower, NP [facility] [phone]

## 2015-09-17 ENCOUNTER — Encounter: Payer: Self-pay | Admitting: Adult Health

## 2015-09-17 ENCOUNTER — Non-Acute Institutional Stay (SKILLED_NURSING_FACILITY): Payer: Medicare Other | Admitting: Adult Health

## 2015-09-17 DIAGNOSIS — S72001S Fracture of unspecified part of neck of right femur, sequela: Secondary | ICD-10-CM

## 2015-09-17 DIAGNOSIS — S065XAA Traumatic subdural hemorrhage with loss of consciousness status unknown, initial encounter: Secondary | ICD-10-CM

## 2015-09-17 DIAGNOSIS — I10 Essential (primary) hypertension: Secondary | ICD-10-CM | POA: Diagnosis not present

## 2015-09-17 DIAGNOSIS — E1165 Type 2 diabetes mellitus with hyperglycemia: Secondary | ICD-10-CM

## 2015-09-17 DIAGNOSIS — R5381 Other malaise: Secondary | ICD-10-CM

## 2015-09-17 DIAGNOSIS — N183 Chronic kidney disease, stage 3 unspecified: Secondary | ICD-10-CM

## 2015-09-17 DIAGNOSIS — E1122 Type 2 diabetes mellitus with diabetic chronic kidney disease: Secondary | ICD-10-CM | POA: Diagnosis not present

## 2015-09-17 DIAGNOSIS — D62 Acute posthemorrhagic anemia: Secondary | ICD-10-CM | POA: Diagnosis not present

## 2015-09-17 DIAGNOSIS — I6200 Nontraumatic subdural hemorrhage, unspecified: Secondary | ICD-10-CM | POA: Diagnosis not present

## 2015-09-17 DIAGNOSIS — E785 Hyperlipidemia, unspecified: Secondary | ICD-10-CM | POA: Diagnosis not present

## 2015-09-17 DIAGNOSIS — IMO0002 Reserved for concepts with insufficient information to code with codable children: Secondary | ICD-10-CM

## 2015-09-17 DIAGNOSIS — S065X9A Traumatic subdural hemorrhage with loss of consciousness of unspecified duration, initial encounter: Secondary | ICD-10-CM

## 2015-09-17 DIAGNOSIS — Z794 Long term (current) use of insulin: Secondary | ICD-10-CM

## 2015-09-17 DIAGNOSIS — F339 Major depressive disorder, recurrent, unspecified: Secondary | ICD-10-CM | POA: Diagnosis not present

## 2015-09-17 DIAGNOSIS — F0390 Unspecified dementia without behavioral disturbance: Secondary | ICD-10-CM | POA: Diagnosis not present

## 2015-09-17 DIAGNOSIS — N400 Enlarged prostate without lower urinary tract symptoms: Secondary | ICD-10-CM | POA: Diagnosis not present

## 2015-09-17 DIAGNOSIS — K5900 Constipation, unspecified: Secondary | ICD-10-CM | POA: Diagnosis not present

## 2015-09-17 NOTE — Progress Notes (Signed)
Patient ID: Dakota White, male   DOB: March 21, 1928, 82 y.o.   MRN: [id number]    DATE:   09/17/15  MRN:  [id number]  BIRTHDAY: 1928/09/01  Facility:  Nursing Home Location:  [facility] Room Number: 703-P  LEVEL OF CARE:  SNF (31)  Contact Information    Name Relation Home Work Mobile   Gingerich-Lewis,[name] Daughter [phone]     Wyett, Narine [phone]     Micahel, Omlor   [phone]       Code Status History    Date Active Date Inactive Code Status Order ID Comments User Context   08/08/2015 11:41 AM 08/14/2015  6:08 PM DNR [id number]  Marybelle Killings, MD Inpatient   08/08/2015  2:07 AM 08/08/2015 11:41 AM DNR [id number]  Rise Patience, MD ED   11/01/2014  2:05 AM 11/04/2014  6:16 PM Full Code [id number]  Edwin Dada, MD Inpatient   10/22/2014  4:41 AM 10/24/2014  6:34 PM Full Code [id number]  Etta Quill, DO ED   10/16/2014  5:45 PM 10/17/2014  7:15 PM Full Code [id number]  Reyne Dumas, MD ED    Questions for Most Recent Historical Code Status (Order [id number])    Question Answer Comment   In the event of cardiac or respiratory ARREST Do not call a "code blue"    In the event of cardiac or respiratory ARREST Do not perform Intubation, CPR, defibrillation or ACLS    In the event of cardiac or respiratory ARREST Use medication by any route, position, wound care, and other measures to relive pain and suffering. May use oxygen, suction and manual treatment of airway obstruction as needed for comfort.         Advance Directive Documentation   Flowsheet Row Most Recent Value  Type of Advance Directive  Out of facility DNR (pink MOST or yellow form), Living will  Pre-existing out of facility DNR order (yellow form or pink MOST form)  No data  "MOST" Form in Place?  No data       Chief Complaint  Patient presents with  . Discharge Note    HISTORY OF PRESENT ILLNESS:  This is an 82 year old male who is for discharge with  Home health PT, OT, CNA and Nursing. DME:  Bedside commode .  He has been admitted to [facility] on 08/14/15 from [facility] with closed right hip fracture post fall and subdural hematoma. He had right hip hemiarthroplasty on 08/09/15. He was treated as well for acute bronchitis and Klebsiella UTI. Neurosurgery was consulted and advised conservative management.  Patient was admitted to this facility for short-term rehabilitation after the patient's recent hospitalization.  Patient has completed SNF rehabilitation and therapy has cleared the patient for discharge.    PAST MEDICAL HISTORY:  Past Medical History:  Diagnosis Date  . Allergic rhinitis   . Anemia of chronic disease   . Asthma with bronchitis   . BPH (benign prostatic hyperplasia)   . Cardiac pacemaker in situ   . Chronic combined systolic and diastolic CHF (congestive heart failure) ([place])    a. EF noted to be down 10/2014 - 2D echo 10/17/14: EF 35-40%, mild LVH,  moderate HK of mid-apical anteroseptal and anterior myocardium, HK of inferior myocardium, grade 1 DD, mild MR.  . Complete heart block ([place])    a. Requiring permanent St. Jude DDD pacemaker by Dr. Lovena Le in 2013.  Marland Kitchen Dementia   . Diabetes mellitus  with renal complications ([place])   . Essential hypertension   . General weakness   . Mental disorder   . Mixed hyperlipidemia   . Peripheral neuropathy ([place])   . Peripheral vascular disease, unspecified ([place])   . Thrombocytopenia ([place])   . Type II or unspecified type diabetes mellitus without mention of complication, uncontrolled      CURRENT MEDICATIONS: Reviewed  Patient's Medications  New Prescriptions   No medications on file  Previous Medications   ACETAMINOPHEN (TYLENOL) 325 MG TABLET    Take 2 tablets (650 mg total) by mouth every 6 (six) hours as needed for mild pain (or Fever >/= 101).   ACETAMINOPHEN (TYLENOL) 500 MG TABLET    Take 1,000 mg by mouth 2 (two) times daily.   ATORVASTATIN (LIPITOR) 20  MG TABLET    Take 20 mg by mouth daily.   CARVEDILOL (COREG) 3.125 MG TABLET    TAKE 1 TABLET BY MOUTH TWICE DAILY   DONEPEZIL (ARICEPT) 10 MG TABLET    Take 1 tablet (10 mg total) by mouth at bedtime.   FINASTERIDE (PROSCAR) 5 MG TABLET    TAKE 1 TABLET BY MOUTH EVERY DAY   GLUCOSE BLOOD TEST STRIP    Once daily   HYDROCODONE-ACETAMINOPHEN (NORCO) 5-325 MG TABLET    Take 1 tablet by mouth every 6 (six) hours as needed for moderate pain.   INSULIN GLARGINE (LANTUS) 100 UNIT/ML INJECTION    Inject 0.35 mLs (35 Units total) into the skin at bedtime.   IPRATROPIUM-ALBUTEROL (DUONEB) 0.5-2.5 (3) MG/3ML SOLN    Take 3 mLs by nebulization 3 (three) times daily as needed.    MEMANTINE (NAMENDA) 10 MG TABLET    TAKE 1 TABLET BY MOUTH EVERY NIGHT AT BEDTIME   MIRTAZAPINE (REMERON) 15 MG TABLET    Take 15 mg by mouth at bedtime.    ONETOUCH DELICA LANCETS 96G MISC    1 Device by Does not apply route 4 (four) times daily as needed.   POLYETHYLENE GLYCOL (MIRALAX / GLYCOLAX) PACKET    Take 17 g by mouth daily as needed for mild constipation.   SERTRALINE (ZOLOFT) 100 MG TABLET    Take 1 tablet (100 mg total) by mouth at bedtime.  Modified Medications   No medications on file  Discontinued Medications   No medications on file     Allergies  Allergen Reactions  . Actos [Pioglitazone] Other (See Comments)    "makes me feel lousy and makes me bleed"  . Ramipril Other (See Comments)    "pt not aware of allergy"     REVIEW OF SYSTEMS:    GENERAL: no change in appetite, no fatigue, no weight changes, no fever, chills or weakness EYES: Denies change in vision, dry eyes, eye pain, itching or discharge EARS: Denies change in hearing, ringing in ears, or earache NOSE: Denies nasal congestion or epistaxis MOUTH and THROAT: Denies oral discomfort, gingival pain or bleeding, pain from teeth or hoarseness   RESPIRATORY: no cough, SOB, DOE, wheezing, hemoptysis CARDIAC: no chest pain, edema or  palpitations GI: no abdominal pain, diarrhea, constipation, heart burn, nausea or vomiting GU: Denies dysuria, frequency, hematuria, incontinence, or discharge PSYCHIATRIC: Denies feeling of depression or anxiety. No report of hallucinations, insomnia, paranoia, or agitation   PHYSICAL EXAMINATION  GENERAL APPEARANCE: Well nourished. In no acute distress. Normal body habitus SKIN:  Right hip surgical incision is healed, right heel wound with foam dressing HEAD: Normal in size and contour. No evidence of  trauma EYES: Lids open and close normally. No blepharitis, entropion or ectropion. PERRL. Conjunctivae are clear and sclerae are white. Lenses are without opacity EARS: Pinnae are normal. Patient hears normal voice tunes of the examiner MOUTH and THROAT: Lips are without lesions. Oral mucosa is moist and without lesions. Tongue is normal in shape, size, and color and without lesions NECK: supple, trachea midline, no neck masses, no thyroid tenderness, no thyromegaly LYMPHATICS: no LAN in the neck, no supraclavicular LAN RESPIRATORY: breathing is even & unlabored, BS CTAB CARDIAC: RRR, no murmur,no extra heart sounds, no edema, left chest pacemaker GI: abdomen soft, normal BS, no masses, no tenderness, no hepatomegaly, no splenomegaly EXTREMITIES:  Able to move 4 extremities PSYCHIATRIC: Alert to self and time, disoriented to place. Affect and behavior are appropriate  LABS/RADIOLOGY: Labs reviewed: 09/12/15   Wbc 4.4  hgb 10.2  hct 32.1  mcv 94.7  Platelet 110  hgbA1c 7.0  Na 144  K 4.2  BUN 22  Creatinine 1.19  Ca 8.6  GFR >60  Total protein 6.1  Albumin 3.53  Total bilirubin 0.34  Direct bilirubin 0.07  Alk phos 89  Sgot 11  Sgpt 6  Cholesterol 102  hdl 27.4  ldl 30 triglycerides 992 Basic Metabolic Panel:  Recent Labs  08/08/15 1906 08/10/15 0637 08/11/15 0252  NA 136 134* 133*  K 4.2 4.2 4.2  CL 106 105 104  CO2 23 23 21*  GLUCOSE 204* 234* 133*  BUN 29* 37* 47*  CREATININE  1.64* 1.86* 1.89*  CALCIUM 8.7* 8.1* 8.1*   Liver Function Tests:  Recent Labs  11/01/14 0230 08/08/15 1906  AST 26 23  ALT 19 17  ALKPHOS 60 76  BILITOT 0.6 0.7  PROT 6.6 7.2  ALBUMIN 3.5 3.6    Recent Labs  10/31/14 2126  LIPASE 30    CBC:  Recent Labs  06/18/15 1140 08/07/15 2338 08/08/15 1906  08/11/15 0252 08/12/15 0234 08/13/15 0334  WBC 6.4 7.5 5.8  < > 4.2 4.4 5.4  NEUTROABS 4.4 6.0 4.5  --   --   --   --   HGB 11.5* 13.2 12.3*  < > 8.6* 8.5* 9.5*  HCT 34.4* 40.9 38.1*  < > 26.1* 27.1* 29.1*  MCV 91.5 92.5 94.1  < > 93.5 93.4 92.4  PLT 101* 148* 104*  < > 79* 106* 133*  < > = values in this interval not displayed.   CBG:  Recent Labs  08/14/15 0406 08/14/15 0843 08/14/15 1122  GLUCAP 153* 83 201*      ASSESSMENT/PLAN:  Physical deconditioning - for Home health CNA, Nursing, PT and OT; fall precaution  Closed right hip fracture S/P right hip hemiarthroplasty - for Home health CNA, Nursing, PT and OT; continue Norco 5/325 mg 1 tab PO Q 6 hours, Tylenol ES 500 mg  1 tab PO every 6 hours when necessary and Tylenol ES 500 mg take 2 tabs = 1,000 mg PO BID for pain; follow-up with orthopedic surgeon, Dr. Lorin Mercy  Subdural hematoma - continue to avoid antiplatelets/anticoagulation; fall precaution  Depression - mood is stable; continue Zoloft 100 mg 1 tab by mouth daily at bedtime and Remeron 15 mg 1 tab by mouth daily at bedtime  Anemia, acute blood loss - hgb 10.2; stable  Hyperlipidemia - continue Lipitor 20 mg 1 tab by mouth daily 09/12/15  Cholesterol 102  HDL 27.4  LDL 30  Triglycerides 225  Hypertension - well controlled; continue Coreg 3.125 mg  1 tab by mouth twice a day  Dementia - continue Namenda 10 mg 1 tab by mouth daily at bedtime and Aricept 10 mg 1 tab by mouth daily at bedtime; fall precaution and continue supportive care  Diabetes mellitus, type II - continue Lantus 100 units/mL injected 35 units subcutaneous Q evening Lab  Results  Component Value Date   HGBA1C 8.0 (H) 11/01/2014   BPH - continue Proscar 5 mg 1 tab by mouth daily  Constipation - continue MiraLAX 17 g by mouth daily when necessary  Chronic kidney disease, stage III - creatinine 1.19, stable      I have filled out patient's discharge paperwork and written prescriptions.  Patient will receive home health PT, OT, Nursing and CNA.  DME provided:  Bedside commode  Total discharge time: Greater than 30 minutes Greater than 50% was spent in counseling and coordination of care with the patient.    Discharge time involved coordination of the discharge process with social worker, nursing staff and therapy department. Medical justification for home health services/DME verified.    Durenda Age, NP [employer] [phone]

## 2015-09-19 DIAGNOSIS — R1310 Dysphagia, unspecified: Secondary | ICD-10-CM | POA: Diagnosis not present

## 2015-09-19 DIAGNOSIS — I130 Hypertensive heart and chronic kidney disease with heart failure and stage 1 through stage 4 chronic kidney disease, or unspecified chronic kidney disease: Secondary | ICD-10-CM | POA: Diagnosis not present

## 2015-09-19 DIAGNOSIS — S72051D Unspecified fracture of head of right femur, subsequent encounter for closed fracture with routine healing: Secondary | ICD-10-CM | POA: Diagnosis not present

## 2015-09-19 DIAGNOSIS — E1142 Type 2 diabetes mellitus with diabetic polyneuropathy: Secondary | ICD-10-CM | POA: Diagnosis not present

## 2015-09-19 DIAGNOSIS — L989 Disorder of the skin and subcutaneous tissue, unspecified: Secondary | ICD-10-CM | POA: Diagnosis not present

## 2015-09-19 DIAGNOSIS — S065X9D Traumatic subdural hemorrhage with loss of consciousness of unspecified duration, subsequent encounter: Secondary | ICD-10-CM | POA: Diagnosis not present

## 2015-09-21 DIAGNOSIS — L989 Disorder of the skin and subcutaneous tissue, unspecified: Secondary | ICD-10-CM | POA: Diagnosis not present

## 2015-09-21 DIAGNOSIS — S72051D Unspecified fracture of head of right femur, subsequent encounter for closed fracture with routine healing: Secondary | ICD-10-CM | POA: Diagnosis not present

## 2015-09-21 DIAGNOSIS — I130 Hypertensive heart and chronic kidney disease with heart failure and stage 1 through stage 4 chronic kidney disease, or unspecified chronic kidney disease: Secondary | ICD-10-CM | POA: Diagnosis not present

## 2015-09-21 DIAGNOSIS — S065X9D Traumatic subdural hemorrhage with loss of consciousness of unspecified duration, subsequent encounter: Secondary | ICD-10-CM | POA: Diagnosis not present

## 2015-09-21 DIAGNOSIS — R1310 Dysphagia, unspecified: Secondary | ICD-10-CM | POA: Diagnosis not present

## 2015-09-21 DIAGNOSIS — E1142 Type 2 diabetes mellitus with diabetic polyneuropathy: Secondary | ICD-10-CM | POA: Diagnosis not present

## 2015-09-24 DIAGNOSIS — S72051D Unspecified fracture of head of right femur, subsequent encounter for closed fracture with routine healing: Secondary | ICD-10-CM | POA: Diagnosis not present

## 2015-09-24 DIAGNOSIS — I130 Hypertensive heart and chronic kidney disease with heart failure and stage 1 through stage 4 chronic kidney disease, or unspecified chronic kidney disease: Secondary | ICD-10-CM | POA: Diagnosis not present

## 2015-09-24 DIAGNOSIS — L989 Disorder of the skin and subcutaneous tissue, unspecified: Secondary | ICD-10-CM | POA: Diagnosis not present

## 2015-09-24 DIAGNOSIS — E1142 Type 2 diabetes mellitus with diabetic polyneuropathy: Secondary | ICD-10-CM | POA: Diagnosis not present

## 2015-09-24 DIAGNOSIS — S065X9D Traumatic subdural hemorrhage with loss of consciousness of unspecified duration, subsequent encounter: Secondary | ICD-10-CM | POA: Diagnosis not present

## 2015-09-24 DIAGNOSIS — R1310 Dysphagia, unspecified: Secondary | ICD-10-CM | POA: Diagnosis not present

## 2015-09-25 DIAGNOSIS — R1310 Dysphagia, unspecified: Secondary | ICD-10-CM | POA: Diagnosis not present

## 2015-09-25 DIAGNOSIS — L989 Disorder of the skin and subcutaneous tissue, unspecified: Secondary | ICD-10-CM | POA: Diagnosis not present

## 2015-09-25 DIAGNOSIS — S065X9D Traumatic subdural hemorrhage with loss of consciousness of unspecified duration, subsequent encounter: Secondary | ICD-10-CM | POA: Diagnosis not present

## 2015-09-25 DIAGNOSIS — S72051D Unspecified fracture of head of right femur, subsequent encounter for closed fracture with routine healing: Secondary | ICD-10-CM | POA: Diagnosis not present

## 2015-09-25 DIAGNOSIS — I130 Hypertensive heart and chronic kidney disease with heart failure and stage 1 through stage 4 chronic kidney disease, or unspecified chronic kidney disease: Secondary | ICD-10-CM | POA: Diagnosis not present

## 2015-09-25 DIAGNOSIS — E1142 Type 2 diabetes mellitus with diabetic polyneuropathy: Secondary | ICD-10-CM | POA: Diagnosis not present

## 2015-09-26 DIAGNOSIS — S065X9D Traumatic subdural hemorrhage with loss of consciousness of unspecified duration, subsequent encounter: Secondary | ICD-10-CM | POA: Diagnosis not present

## 2015-09-26 DIAGNOSIS — E1142 Type 2 diabetes mellitus with diabetic polyneuropathy: Secondary | ICD-10-CM | POA: Diagnosis not present

## 2015-09-26 DIAGNOSIS — I130 Hypertensive heart and chronic kidney disease with heart failure and stage 1 through stage 4 chronic kidney disease, or unspecified chronic kidney disease: Secondary | ICD-10-CM | POA: Diagnosis not present

## 2015-09-26 DIAGNOSIS — L989 Disorder of the skin and subcutaneous tissue, unspecified: Secondary | ICD-10-CM | POA: Diagnosis not present

## 2015-09-26 DIAGNOSIS — S72051D Unspecified fracture of head of right femur, subsequent encounter for closed fracture with routine healing: Secondary | ICD-10-CM | POA: Diagnosis not present

## 2015-09-26 DIAGNOSIS — R1310 Dysphagia, unspecified: Secondary | ICD-10-CM | POA: Diagnosis not present

## 2015-09-29 DIAGNOSIS — S065X9D Traumatic subdural hemorrhage with loss of consciousness of unspecified duration, subsequent encounter: Secondary | ICD-10-CM | POA: Diagnosis not present

## 2015-09-29 DIAGNOSIS — I130 Hypertensive heart and chronic kidney disease with heart failure and stage 1 through stage 4 chronic kidney disease, or unspecified chronic kidney disease: Secondary | ICD-10-CM | POA: Diagnosis not present

## 2015-09-29 DIAGNOSIS — R1310 Dysphagia, unspecified: Secondary | ICD-10-CM | POA: Diagnosis not present

## 2015-09-29 DIAGNOSIS — L989 Disorder of the skin and subcutaneous tissue, unspecified: Secondary | ICD-10-CM | POA: Diagnosis not present

## 2015-09-29 DIAGNOSIS — E1142 Type 2 diabetes mellitus with diabetic polyneuropathy: Secondary | ICD-10-CM | POA: Diagnosis not present

## 2015-09-29 DIAGNOSIS — S72051D Unspecified fracture of head of right femur, subsequent encounter for closed fracture with routine healing: Secondary | ICD-10-CM | POA: Diagnosis not present

## 2015-09-30 DIAGNOSIS — S065X9D Traumatic subdural hemorrhage with loss of consciousness of unspecified duration, subsequent encounter: Secondary | ICD-10-CM | POA: Diagnosis not present

## 2015-09-30 DIAGNOSIS — S72051D Unspecified fracture of head of right femur, subsequent encounter for closed fracture with routine healing: Secondary | ICD-10-CM | POA: Diagnosis not present

## 2015-09-30 DIAGNOSIS — E1142 Type 2 diabetes mellitus with diabetic polyneuropathy: Secondary | ICD-10-CM | POA: Diagnosis not present

## 2015-09-30 DIAGNOSIS — R1310 Dysphagia, unspecified: Secondary | ICD-10-CM | POA: Diagnosis not present

## 2015-09-30 DIAGNOSIS — I130 Hypertensive heart and chronic kidney disease with heart failure and stage 1 through stage 4 chronic kidney disease, or unspecified chronic kidney disease: Secondary | ICD-10-CM | POA: Diagnosis not present

## 2015-09-30 DIAGNOSIS — L989 Disorder of the skin and subcutaneous tissue, unspecified: Secondary | ICD-10-CM | POA: Diagnosis not present

## 2015-10-01 DIAGNOSIS — E1142 Type 2 diabetes mellitus with diabetic polyneuropathy: Secondary | ICD-10-CM | POA: Diagnosis not present

## 2015-10-01 DIAGNOSIS — S72051D Unspecified fracture of head of right femur, subsequent encounter for closed fracture with routine healing: Secondary | ICD-10-CM | POA: Diagnosis not present

## 2015-10-01 DIAGNOSIS — S065X9D Traumatic subdural hemorrhage with loss of consciousness of unspecified duration, subsequent encounter: Secondary | ICD-10-CM | POA: Diagnosis not present

## 2015-10-01 DIAGNOSIS — L989 Disorder of the skin and subcutaneous tissue, unspecified: Secondary | ICD-10-CM | POA: Diagnosis not present

## 2015-10-01 DIAGNOSIS — I130 Hypertensive heart and chronic kidney disease with heart failure and stage 1 through stage 4 chronic kidney disease, or unspecified chronic kidney disease: Secondary | ICD-10-CM | POA: Diagnosis not present

## 2015-10-01 DIAGNOSIS — R1310 Dysphagia, unspecified: Secondary | ICD-10-CM | POA: Diagnosis not present

## 2015-10-02 DIAGNOSIS — L989 Disorder of the skin and subcutaneous tissue, unspecified: Secondary | ICD-10-CM | POA: Diagnosis not present

## 2015-10-02 DIAGNOSIS — S065X9D Traumatic subdural hemorrhage with loss of consciousness of unspecified duration, subsequent encounter: Secondary | ICD-10-CM | POA: Diagnosis not present

## 2015-10-02 DIAGNOSIS — I130 Hypertensive heart and chronic kidney disease with heart failure and stage 1 through stage 4 chronic kidney disease, or unspecified chronic kidney disease: Secondary | ICD-10-CM | POA: Diagnosis not present

## 2015-10-02 DIAGNOSIS — S72051D Unspecified fracture of head of right femur, subsequent encounter for closed fracture with routine healing: Secondary | ICD-10-CM | POA: Diagnosis not present

## 2015-10-02 DIAGNOSIS — R1310 Dysphagia, unspecified: Secondary | ICD-10-CM | POA: Diagnosis not present

## 2015-10-02 DIAGNOSIS — E1142 Type 2 diabetes mellitus with diabetic polyneuropathy: Secondary | ICD-10-CM | POA: Diagnosis not present

## 2015-10-07 DIAGNOSIS — R1310 Dysphagia, unspecified: Secondary | ICD-10-CM | POA: Diagnosis not present

## 2015-10-07 DIAGNOSIS — E1142 Type 2 diabetes mellitus with diabetic polyneuropathy: Secondary | ICD-10-CM | POA: Diagnosis not present

## 2015-10-07 DIAGNOSIS — I130 Hypertensive heart and chronic kidney disease with heart failure and stage 1 through stage 4 chronic kidney disease, or unspecified chronic kidney disease: Secondary | ICD-10-CM | POA: Diagnosis not present

## 2015-10-07 DIAGNOSIS — S72051D Unspecified fracture of head of right femur, subsequent encounter for closed fracture with routine healing: Secondary | ICD-10-CM | POA: Diagnosis not present

## 2015-10-07 DIAGNOSIS — L989 Disorder of the skin and subcutaneous tissue, unspecified: Secondary | ICD-10-CM | POA: Diagnosis not present

## 2015-10-07 DIAGNOSIS — S065X9D Traumatic subdural hemorrhage with loss of consciousness of unspecified duration, subsequent encounter: Secondary | ICD-10-CM | POA: Diagnosis not present

## 2015-10-08 DIAGNOSIS — R1310 Dysphagia, unspecified: Secondary | ICD-10-CM | POA: Diagnosis not present

## 2015-10-08 DIAGNOSIS — E1142 Type 2 diabetes mellitus with diabetic polyneuropathy: Secondary | ICD-10-CM | POA: Diagnosis not present

## 2015-10-08 DIAGNOSIS — S72051D Unspecified fracture of head of right femur, subsequent encounter for closed fracture with routine healing: Secondary | ICD-10-CM | POA: Diagnosis not present

## 2015-10-08 DIAGNOSIS — S065X9D Traumatic subdural hemorrhage with loss of consciousness of unspecified duration, subsequent encounter: Secondary | ICD-10-CM | POA: Diagnosis not present

## 2015-10-08 DIAGNOSIS — I130 Hypertensive heart and chronic kidney disease with heart failure and stage 1 through stage 4 chronic kidney disease, or unspecified chronic kidney disease: Secondary | ICD-10-CM | POA: Diagnosis not present

## 2015-10-08 DIAGNOSIS — L989 Disorder of the skin and subcutaneous tissue, unspecified: Secondary | ICD-10-CM | POA: Diagnosis not present

## 2015-10-09 DIAGNOSIS — S72051D Unspecified fracture of head of right femur, subsequent encounter for closed fracture with routine healing: Secondary | ICD-10-CM | POA: Diagnosis not present

## 2015-10-09 DIAGNOSIS — L989 Disorder of the skin and subcutaneous tissue, unspecified: Secondary | ICD-10-CM | POA: Diagnosis not present

## 2015-10-09 DIAGNOSIS — I130 Hypertensive heart and chronic kidney disease with heart failure and stage 1 through stage 4 chronic kidney disease, or unspecified chronic kidney disease: Secondary | ICD-10-CM | POA: Diagnosis not present

## 2015-10-09 DIAGNOSIS — E1142 Type 2 diabetes mellitus with diabetic polyneuropathy: Secondary | ICD-10-CM | POA: Diagnosis not present

## 2015-10-09 DIAGNOSIS — S065X9D Traumatic subdural hemorrhage with loss of consciousness of unspecified duration, subsequent encounter: Secondary | ICD-10-CM | POA: Diagnosis not present

## 2015-10-09 DIAGNOSIS — R1310 Dysphagia, unspecified: Secondary | ICD-10-CM | POA: Diagnosis not present

## 2015-10-13 DIAGNOSIS — I130 Hypertensive heart and chronic kidney disease with heart failure and stage 1 through stage 4 chronic kidney disease, or unspecified chronic kidney disease: Secondary | ICD-10-CM | POA: Diagnosis not present

## 2015-10-13 DIAGNOSIS — L989 Disorder of the skin and subcutaneous tissue, unspecified: Secondary | ICD-10-CM | POA: Diagnosis not present

## 2015-10-13 DIAGNOSIS — E1142 Type 2 diabetes mellitus with diabetic polyneuropathy: Secondary | ICD-10-CM | POA: Diagnosis not present

## 2015-10-13 DIAGNOSIS — R1310 Dysphagia, unspecified: Secondary | ICD-10-CM | POA: Diagnosis not present

## 2015-10-13 DIAGNOSIS — S065X9D Traumatic subdural hemorrhage with loss of consciousness of unspecified duration, subsequent encounter: Secondary | ICD-10-CM | POA: Diagnosis not present

## 2015-10-13 DIAGNOSIS — S72051D Unspecified fracture of head of right femur, subsequent encounter for closed fracture with routine healing: Secondary | ICD-10-CM | POA: Diagnosis not present

## 2015-10-15 DIAGNOSIS — E1142 Type 2 diabetes mellitus with diabetic polyneuropathy: Secondary | ICD-10-CM | POA: Diagnosis not present

## 2015-10-15 DIAGNOSIS — I130 Hypertensive heart and chronic kidney disease with heart failure and stage 1 through stage 4 chronic kidney disease, or unspecified chronic kidney disease: Secondary | ICD-10-CM | POA: Diagnosis not present

## 2015-10-15 DIAGNOSIS — L989 Disorder of the skin and subcutaneous tissue, unspecified: Secondary | ICD-10-CM | POA: Diagnosis not present

## 2015-10-15 DIAGNOSIS — R1310 Dysphagia, unspecified: Secondary | ICD-10-CM | POA: Diagnosis not present

## 2015-10-15 DIAGNOSIS — S065X9D Traumatic subdural hemorrhage with loss of consciousness of unspecified duration, subsequent encounter: Secondary | ICD-10-CM | POA: Diagnosis not present

## 2015-10-15 DIAGNOSIS — S72051D Unspecified fracture of head of right femur, subsequent encounter for closed fracture with routine healing: Secondary | ICD-10-CM | POA: Diagnosis not present

## 2015-10-17 DIAGNOSIS — I130 Hypertensive heart and chronic kidney disease with heart failure and stage 1 through stage 4 chronic kidney disease, or unspecified chronic kidney disease: Secondary | ICD-10-CM | POA: Diagnosis not present

## 2015-10-17 DIAGNOSIS — L989 Disorder of the skin and subcutaneous tissue, unspecified: Secondary | ICD-10-CM | POA: Diagnosis not present

## 2015-10-17 DIAGNOSIS — E1142 Type 2 diabetes mellitus with diabetic polyneuropathy: Secondary | ICD-10-CM | POA: Diagnosis not present

## 2015-10-17 DIAGNOSIS — R1310 Dysphagia, unspecified: Secondary | ICD-10-CM | POA: Diagnosis not present

## 2015-10-17 DIAGNOSIS — S065X9D Traumatic subdural hemorrhage with loss of consciousness of unspecified duration, subsequent encounter: Secondary | ICD-10-CM | POA: Diagnosis not present

## 2015-10-17 DIAGNOSIS — S72051D Unspecified fracture of head of right femur, subsequent encounter for closed fracture with routine healing: Secondary | ICD-10-CM | POA: Diagnosis not present

## 2015-10-20 DIAGNOSIS — L989 Disorder of the skin and subcutaneous tissue, unspecified: Secondary | ICD-10-CM | POA: Diagnosis not present

## 2015-10-20 DIAGNOSIS — E1142 Type 2 diabetes mellitus with diabetic polyneuropathy: Secondary | ICD-10-CM | POA: Diagnosis not present

## 2015-10-20 DIAGNOSIS — R1310 Dysphagia, unspecified: Secondary | ICD-10-CM | POA: Diagnosis not present

## 2015-10-20 DIAGNOSIS — S72051D Unspecified fracture of head of right femur, subsequent encounter for closed fracture with routine healing: Secondary | ICD-10-CM | POA: Diagnosis not present

## 2015-10-20 DIAGNOSIS — S065X9D Traumatic subdural hemorrhage with loss of consciousness of unspecified duration, subsequent encounter: Secondary | ICD-10-CM | POA: Diagnosis not present

## 2015-10-20 DIAGNOSIS — I130 Hypertensive heart and chronic kidney disease with heart failure and stage 1 through stage 4 chronic kidney disease, or unspecified chronic kidney disease: Secondary | ICD-10-CM | POA: Diagnosis not present

## 2015-10-21 DIAGNOSIS — E1142 Type 2 diabetes mellitus with diabetic polyneuropathy: Secondary | ICD-10-CM | POA: Diagnosis not present

## 2015-10-21 DIAGNOSIS — S065X9D Traumatic subdural hemorrhage with loss of consciousness of unspecified duration, subsequent encounter: Secondary | ICD-10-CM | POA: Diagnosis not present

## 2015-10-21 DIAGNOSIS — R1310 Dysphagia, unspecified: Secondary | ICD-10-CM | POA: Diagnosis not present

## 2015-10-21 DIAGNOSIS — I130 Hypertensive heart and chronic kidney disease with heart failure and stage 1 through stage 4 chronic kidney disease, or unspecified chronic kidney disease: Secondary | ICD-10-CM | POA: Diagnosis not present

## 2015-10-21 DIAGNOSIS — S72051D Unspecified fracture of head of right femur, subsequent encounter for closed fracture with routine healing: Secondary | ICD-10-CM | POA: Diagnosis not present

## 2015-10-21 DIAGNOSIS — L989 Disorder of the skin and subcutaneous tissue, unspecified: Secondary | ICD-10-CM | POA: Diagnosis not present

## 2015-10-22 DIAGNOSIS — S72051D Unspecified fracture of head of right femur, subsequent encounter for closed fracture with routine healing: Secondary | ICD-10-CM | POA: Diagnosis not present

## 2015-10-22 DIAGNOSIS — L989 Disorder of the skin and subcutaneous tissue, unspecified: Secondary | ICD-10-CM | POA: Diagnosis not present

## 2015-10-22 DIAGNOSIS — R1310 Dysphagia, unspecified: Secondary | ICD-10-CM | POA: Diagnosis not present

## 2015-10-22 DIAGNOSIS — I130 Hypertensive heart and chronic kidney disease with heart failure and stage 1 through stage 4 chronic kidney disease, or unspecified chronic kidney disease: Secondary | ICD-10-CM | POA: Diagnosis not present

## 2015-10-22 DIAGNOSIS — E1142 Type 2 diabetes mellitus with diabetic polyneuropathy: Secondary | ICD-10-CM | POA: Diagnosis not present

## 2015-10-22 DIAGNOSIS — S065X9D Traumatic subdural hemorrhage with loss of consciousness of unspecified duration, subsequent encounter: Secondary | ICD-10-CM | POA: Diagnosis not present

## 2015-10-24 DIAGNOSIS — S72051D Unspecified fracture of head of right femur, subsequent encounter for closed fracture with routine healing: Secondary | ICD-10-CM | POA: Diagnosis not present

## 2015-10-24 DIAGNOSIS — L989 Disorder of the skin and subcutaneous tissue, unspecified: Secondary | ICD-10-CM | POA: Diagnosis not present

## 2015-10-24 DIAGNOSIS — I130 Hypertensive heart and chronic kidney disease with heart failure and stage 1 through stage 4 chronic kidney disease, or unspecified chronic kidney disease: Secondary | ICD-10-CM | POA: Diagnosis not present

## 2015-10-24 DIAGNOSIS — E1142 Type 2 diabetes mellitus with diabetic polyneuropathy: Secondary | ICD-10-CM | POA: Diagnosis not present

## 2015-10-24 DIAGNOSIS — S065X9D Traumatic subdural hemorrhage with loss of consciousness of unspecified duration, subsequent encounter: Secondary | ICD-10-CM | POA: Diagnosis not present

## 2015-10-24 DIAGNOSIS — R1310 Dysphagia, unspecified: Secondary | ICD-10-CM | POA: Diagnosis not present

## 2015-10-27 DIAGNOSIS — S065X9D Traumatic subdural hemorrhage with loss of consciousness of unspecified duration, subsequent encounter: Secondary | ICD-10-CM | POA: Diagnosis not present

## 2015-10-27 DIAGNOSIS — I130 Hypertensive heart and chronic kidney disease with heart failure and stage 1 through stage 4 chronic kidney disease, or unspecified chronic kidney disease: Secondary | ICD-10-CM | POA: Diagnosis not present

## 2015-10-27 DIAGNOSIS — E1142 Type 2 diabetes mellitus with diabetic polyneuropathy: Secondary | ICD-10-CM | POA: Diagnosis not present

## 2015-10-27 DIAGNOSIS — S72051D Unspecified fracture of head of right femur, subsequent encounter for closed fracture with routine healing: Secondary | ICD-10-CM | POA: Diagnosis not present

## 2015-10-27 DIAGNOSIS — R1310 Dysphagia, unspecified: Secondary | ICD-10-CM | POA: Diagnosis not present

## 2015-10-27 DIAGNOSIS — L989 Disorder of the skin and subcutaneous tissue, unspecified: Secondary | ICD-10-CM | POA: Diagnosis not present

## 2015-10-28 ENCOUNTER — Encounter: Payer: Self-pay | Admitting: Adult Health

## 2015-10-28 NOTE — Progress Notes (Signed)
This encounter was created in error - please disregard.

## 2015-10-29 DIAGNOSIS — E1142 Type 2 diabetes mellitus with diabetic polyneuropathy: Secondary | ICD-10-CM | POA: Diagnosis not present

## 2015-10-29 DIAGNOSIS — S065X9D Traumatic subdural hemorrhage with loss of consciousness of unspecified duration, subsequent encounter: Secondary | ICD-10-CM | POA: Diagnosis not present

## 2015-10-29 DIAGNOSIS — S72051D Unspecified fracture of head of right femur, subsequent encounter for closed fracture with routine healing: Secondary | ICD-10-CM | POA: Diagnosis not present

## 2015-10-29 DIAGNOSIS — L989 Disorder of the skin and subcutaneous tissue, unspecified: Secondary | ICD-10-CM | POA: Diagnosis not present

## 2015-10-29 DIAGNOSIS — I130 Hypertensive heart and chronic kidney disease with heart failure and stage 1 through stage 4 chronic kidney disease, or unspecified chronic kidney disease: Secondary | ICD-10-CM | POA: Diagnosis not present

## 2015-10-29 DIAGNOSIS — R1310 Dysphagia, unspecified: Secondary | ICD-10-CM | POA: Diagnosis not present

## 2015-10-31 DIAGNOSIS — L989 Disorder of the skin and subcutaneous tissue, unspecified: Secondary | ICD-10-CM | POA: Diagnosis not present

## 2015-10-31 DIAGNOSIS — S065X9D Traumatic subdural hemorrhage with loss of consciousness of unspecified duration, subsequent encounter: Secondary | ICD-10-CM | POA: Diagnosis not present

## 2015-10-31 DIAGNOSIS — E1142 Type 2 diabetes mellitus with diabetic polyneuropathy: Secondary | ICD-10-CM | POA: Diagnosis not present

## 2015-10-31 DIAGNOSIS — R1310 Dysphagia, unspecified: Secondary | ICD-10-CM | POA: Diagnosis not present

## 2015-10-31 DIAGNOSIS — I130 Hypertensive heart and chronic kidney disease with heart failure and stage 1 through stage 4 chronic kidney disease, or unspecified chronic kidney disease: Secondary | ICD-10-CM | POA: Diagnosis not present

## 2015-10-31 DIAGNOSIS — S72051D Unspecified fracture of head of right femur, subsequent encounter for closed fracture with routine healing: Secondary | ICD-10-CM | POA: Diagnosis not present

## 2015-11-03 DIAGNOSIS — R1310 Dysphagia, unspecified: Secondary | ICD-10-CM | POA: Diagnosis not present

## 2015-11-03 DIAGNOSIS — S72051D Unspecified fracture of head of right femur, subsequent encounter for closed fracture with routine healing: Secondary | ICD-10-CM | POA: Diagnosis not present

## 2015-11-03 DIAGNOSIS — E1142 Type 2 diabetes mellitus with diabetic polyneuropathy: Secondary | ICD-10-CM | POA: Diagnosis not present

## 2015-11-03 DIAGNOSIS — I130 Hypertensive heart and chronic kidney disease with heart failure and stage 1 through stage 4 chronic kidney disease, or unspecified chronic kidney disease: Secondary | ICD-10-CM | POA: Diagnosis not present

## 2015-11-03 DIAGNOSIS — S065X9D Traumatic subdural hemorrhage with loss of consciousness of unspecified duration, subsequent encounter: Secondary | ICD-10-CM | POA: Diagnosis not present

## 2015-11-03 DIAGNOSIS — L989 Disorder of the skin and subcutaneous tissue, unspecified: Secondary | ICD-10-CM | POA: Diagnosis not present

## 2015-11-04 DIAGNOSIS — L989 Disorder of the skin and subcutaneous tissue, unspecified: Secondary | ICD-10-CM | POA: Diagnosis not present

## 2015-11-04 DIAGNOSIS — R1310 Dysphagia, unspecified: Secondary | ICD-10-CM | POA: Diagnosis not present

## 2015-11-04 DIAGNOSIS — S065X9D Traumatic subdural hemorrhage with loss of consciousness of unspecified duration, subsequent encounter: Secondary | ICD-10-CM | POA: Diagnosis not present

## 2015-11-04 DIAGNOSIS — E1142 Type 2 diabetes mellitus with diabetic polyneuropathy: Secondary | ICD-10-CM | POA: Diagnosis not present

## 2015-11-04 DIAGNOSIS — S72051D Unspecified fracture of head of right femur, subsequent encounter for closed fracture with routine healing: Secondary | ICD-10-CM | POA: Diagnosis not present

## 2015-11-04 DIAGNOSIS — I130 Hypertensive heart and chronic kidney disease with heart failure and stage 1 through stage 4 chronic kidney disease, or unspecified chronic kidney disease: Secondary | ICD-10-CM | POA: Diagnosis not present

## 2015-11-05 DIAGNOSIS — S065X9D Traumatic subdural hemorrhage with loss of consciousness of unspecified duration, subsequent encounter: Secondary | ICD-10-CM | POA: Diagnosis not present

## 2015-11-05 DIAGNOSIS — S72051D Unspecified fracture of head of right femur, subsequent encounter for closed fracture with routine healing: Secondary | ICD-10-CM | POA: Diagnosis not present

## 2015-11-05 DIAGNOSIS — E1142 Type 2 diabetes mellitus with diabetic polyneuropathy: Secondary | ICD-10-CM | POA: Diagnosis not present

## 2015-11-05 DIAGNOSIS — I130 Hypertensive heart and chronic kidney disease with heart failure and stage 1 through stage 4 chronic kidney disease, or unspecified chronic kidney disease: Secondary | ICD-10-CM | POA: Diagnosis not present

## 2015-11-05 DIAGNOSIS — R1310 Dysphagia, unspecified: Secondary | ICD-10-CM | POA: Diagnosis not present

## 2015-11-05 DIAGNOSIS — L989 Disorder of the skin and subcutaneous tissue, unspecified: Secondary | ICD-10-CM | POA: Diagnosis not present

## 2015-11-07 DIAGNOSIS — I130 Hypertensive heart and chronic kidney disease with heart failure and stage 1 through stage 4 chronic kidney disease, or unspecified chronic kidney disease: Secondary | ICD-10-CM | POA: Diagnosis not present

## 2015-11-07 DIAGNOSIS — S065X9D Traumatic subdural hemorrhage with loss of consciousness of unspecified duration, subsequent encounter: Secondary | ICD-10-CM | POA: Diagnosis not present

## 2015-11-07 DIAGNOSIS — L989 Disorder of the skin and subcutaneous tissue, unspecified: Secondary | ICD-10-CM | POA: Diagnosis not present

## 2015-11-07 DIAGNOSIS — E1142 Type 2 diabetes mellitus with diabetic polyneuropathy: Secondary | ICD-10-CM | POA: Diagnosis not present

## 2015-11-07 DIAGNOSIS — R1310 Dysphagia, unspecified: Secondary | ICD-10-CM | POA: Diagnosis not present

## 2015-11-07 DIAGNOSIS — S72051D Unspecified fracture of head of right femur, subsequent encounter for closed fracture with routine healing: Secondary | ICD-10-CM | POA: Diagnosis not present

## 2015-11-12 DIAGNOSIS — L989 Disorder of the skin and subcutaneous tissue, unspecified: Secondary | ICD-10-CM | POA: Diagnosis not present

## 2015-11-12 DIAGNOSIS — E1142 Type 2 diabetes mellitus with diabetic polyneuropathy: Secondary | ICD-10-CM | POA: Diagnosis not present

## 2015-11-12 DIAGNOSIS — I130 Hypertensive heart and chronic kidney disease with heart failure and stage 1 through stage 4 chronic kidney disease, or unspecified chronic kidney disease: Secondary | ICD-10-CM | POA: Diagnosis not present

## 2015-11-12 DIAGNOSIS — R1310 Dysphagia, unspecified: Secondary | ICD-10-CM | POA: Diagnosis not present

## 2015-11-12 DIAGNOSIS — S72051D Unspecified fracture of head of right femur, subsequent encounter for closed fracture with routine healing: Secondary | ICD-10-CM | POA: Diagnosis not present

## 2015-11-12 DIAGNOSIS — S065X9D Traumatic subdural hemorrhage with loss of consciousness of unspecified duration, subsequent encounter: Secondary | ICD-10-CM | POA: Diagnosis not present

## 2015-11-13 ENCOUNTER — Other Ambulatory Visit: Payer: Self-pay | Admitting: Adult Health

## 2015-11-13 DIAGNOSIS — S72051D Unspecified fracture of head of right femur, subsequent encounter for closed fracture with routine healing: Secondary | ICD-10-CM | POA: Diagnosis not present

## 2015-11-13 DIAGNOSIS — I130 Hypertensive heart and chronic kidney disease with heart failure and stage 1 through stage 4 chronic kidney disease, or unspecified chronic kidney disease: Secondary | ICD-10-CM | POA: Diagnosis not present

## 2015-11-13 DIAGNOSIS — L989 Disorder of the skin and subcutaneous tissue, unspecified: Secondary | ICD-10-CM | POA: Diagnosis not present

## 2015-11-13 DIAGNOSIS — R1310 Dysphagia, unspecified: Secondary | ICD-10-CM | POA: Diagnosis not present

## 2015-11-13 DIAGNOSIS — E1142 Type 2 diabetes mellitus with diabetic polyneuropathy: Secondary | ICD-10-CM | POA: Diagnosis not present

## 2015-11-13 DIAGNOSIS — S065X9D Traumatic subdural hemorrhage with loss of consciousness of unspecified duration, subsequent encounter: Secondary | ICD-10-CM | POA: Diagnosis not present

## 2015-11-17 ENCOUNTER — Telehealth: Payer: Self-pay | Admitting: Family Medicine

## 2015-11-17 NOTE — Telephone Encounter (Signed)
Daughter Mammie LorenzoBetsy Ridener Lewis  Whom has POA, request medical records 02/01/14 - 11/17/15. Form put on desk for Harriet.

## 2015-12-11 ENCOUNTER — Other Ambulatory Visit: Payer: Self-pay | Admitting: Adult Health

## 2015-12-22 ENCOUNTER — Other Ambulatory Visit: Payer: Self-pay | Admitting: Adult Health

## 2015-12-28 ENCOUNTER — Other Ambulatory Visit: Payer: Self-pay | Admitting: Adult Health

## 2016-03-01 ENCOUNTER — Telehealth: Payer: Self-pay | Admitting: Family Medicine

## 2016-03-01 NOTE — Telephone Encounter (Signed)
Outgoing call to Almon RegisterBetsy Lewis (emergency contact). [place] and will need to call back to schedule an appointment for her father when she is available to drive up. Patient is overdue for HM (A1C, foot exam, etc.)

## 2016-03-12 ENCOUNTER — Inpatient Hospital Stay (HOSPITAL_COMMUNITY)
Admit: 2016-03-12 | Discharge: 2016-03-12 | Disposition: A | Discharge status: Home / Self Care | Payer: Medicare Other | Attending: Emergency Medicine | Admitting: Emergency Medicine

## 2016-03-12 ENCOUNTER — Inpatient Hospital Stay (HOSPITAL_COMMUNITY)
Admission: EM | Admit: 2016-03-12 | Discharge: 2016-03-20 | DRG: 082 | Disposition: A | Discharge status: Skilled Nursing Facility | Payer: Medicare Other | Attending: Internal Medicine | Admitting: Internal Medicine

## 2016-03-12 ENCOUNTER — Encounter (HOSPITAL_COMMUNITY): Payer: Self-pay | Admitting: Emergency Medicine

## 2016-03-12 ENCOUNTER — Emergency Department (HOSPITAL_COMMUNITY): Payer: Medicare Other

## 2016-03-12 DIAGNOSIS — E1151 Type 2 diabetes mellitus with diabetic peripheral angiopathy without gangrene: Secondary | ICD-10-CM | POA: Diagnosis present

## 2016-03-12 DIAGNOSIS — I5042 Chronic combined systolic (congestive) and diastolic (congestive) heart failure: Secondary | ICD-10-CM | POA: Diagnosis present

## 2016-03-12 DIAGNOSIS — F0150 Vascular dementia without behavioral disturbance: Secondary | ICD-10-CM | POA: Diagnosis not present

## 2016-03-12 DIAGNOSIS — M6281 Muscle weakness (generalized): Secondary | ICD-10-CM | POA: Diagnosis not present

## 2016-03-12 DIAGNOSIS — A419 Sepsis, unspecified organism: Secondary | ICD-10-CM | POA: Diagnosis not present

## 2016-03-12 DIAGNOSIS — E1122 Type 2 diabetes mellitus with diabetic chronic kidney disease: Secondary | ICD-10-CM | POA: Diagnosis present

## 2016-03-12 DIAGNOSIS — E876 Hypokalemia: Secondary | ICD-10-CM | POA: Diagnosis not present

## 2016-03-12 DIAGNOSIS — F13239 Sedative, hypnotic or anxiolytic dependence with withdrawal, unspecified: Secondary | ICD-10-CM | POA: Diagnosis present

## 2016-03-12 DIAGNOSIS — L8990 Pressure ulcer of unspecified site, unspecified stage: Secondary | ICD-10-CM | POA: Insufficient documentation

## 2016-03-12 DIAGNOSIS — R51 Headache: Secondary | ICD-10-CM

## 2016-03-12 DIAGNOSIS — I130 Hypertensive heart and chronic kidney disease with heart failure and stage 1 through stage 4 chronic kidney disease, or unspecified chronic kidney disease: Secondary | ICD-10-CM | POA: Diagnosis present

## 2016-03-12 DIAGNOSIS — E1129 Type 2 diabetes mellitus with other diabetic kidney complication: Secondary | ICD-10-CM | POA: Diagnosis present

## 2016-03-12 DIAGNOSIS — I10 Essential (primary) hypertension: Secondary | ICD-10-CM | POA: Diagnosis not present

## 2016-03-12 DIAGNOSIS — E869 Volume depletion, unspecified: Secondary | ICD-10-CM | POA: Diagnosis present

## 2016-03-12 DIAGNOSIS — R41841 Cognitive communication deficit: Secondary | ICD-10-CM | POA: Diagnosis not present

## 2016-03-12 DIAGNOSIS — Z950 Presence of cardiac pacemaker: Secondary | ICD-10-CM | POA: Diagnosis not present

## 2016-03-12 DIAGNOSIS — S065XAA Traumatic subdural hemorrhage with loss of consciousness status unknown, initial encounter: Secondary | ICD-10-CM

## 2016-03-12 DIAGNOSIS — N183 Chronic kidney disease, stage 3 (moderate): Secondary | ICD-10-CM | POA: Diagnosis present

## 2016-03-12 DIAGNOSIS — T68XXXA Hypothermia, initial encounter: Secondary | ICD-10-CM | POA: Diagnosis not present

## 2016-03-12 DIAGNOSIS — Z87440 Personal history of urinary (tract) infections: Secondary | ICD-10-CM

## 2016-03-12 DIAGNOSIS — R1310 Dysphagia, unspecified: Secondary | ICD-10-CM | POA: Diagnosis present

## 2016-03-12 DIAGNOSIS — D696 Thrombocytopenia, unspecified: Secondary | ICD-10-CM | POA: Diagnosis present

## 2016-03-12 DIAGNOSIS — F0281 Dementia in other diseases classified elsewhere with behavioral disturbance: Secondary | ICD-10-CM | POA: Diagnosis not present

## 2016-03-12 DIAGNOSIS — N400 Enlarged prostate without lower urinary tract symptoms: Secondary | ICD-10-CM | POA: Diagnosis present

## 2016-03-12 DIAGNOSIS — Z91128 Patient's intentional underdosing of medication regimen for other reason: Secondary | ICD-10-CM

## 2016-03-12 DIAGNOSIS — R627 Adult failure to thrive: Secondary | ICD-10-CM | POA: Diagnosis present

## 2016-03-12 DIAGNOSIS — R519 Headache, unspecified: Secondary | ICD-10-CM

## 2016-03-12 DIAGNOSIS — Z888 Allergy status to other drugs, medicaments and biological substances status: Secondary | ICD-10-CM

## 2016-03-12 DIAGNOSIS — L8992 Pressure ulcer of unspecified site, stage 2: Secondary | ICD-10-CM | POA: Diagnosis present

## 2016-03-12 DIAGNOSIS — Z6825 Body mass index (BMI) 25.0-25.9, adult: Secondary | ICD-10-CM

## 2016-03-12 DIAGNOSIS — R52 Pain, unspecified: Secondary | ICD-10-CM

## 2016-03-12 DIAGNOSIS — E162 Hypoglycemia, unspecified: Secondary | ICD-10-CM | POA: Diagnosis not present

## 2016-03-12 DIAGNOSIS — E11649 Type 2 diabetes mellitus with hypoglycemia without coma: Secondary | ICD-10-CM | POA: Diagnosis present

## 2016-03-12 DIAGNOSIS — T68XXXS Hypothermia, sequela: Secondary | ICD-10-CM | POA: Diagnosis not present

## 2016-03-12 DIAGNOSIS — R443 Hallucinations, unspecified: Secondary | ICD-10-CM | POA: Diagnosis present

## 2016-03-12 DIAGNOSIS — R059 Cough, unspecified: Secondary | ICD-10-CM

## 2016-03-12 DIAGNOSIS — R569 Unspecified convulsions: Secondary | ICD-10-CM | POA: Diagnosis not present

## 2016-03-12 DIAGNOSIS — R9401 Abnormal electroencephalogram [EEG]: Secondary | ICD-10-CM | POA: Diagnosis present

## 2016-03-12 DIAGNOSIS — Z66 Do not resuscitate: Secondary | ICD-10-CM | POA: Diagnosis not present

## 2016-03-12 DIAGNOSIS — I6201 Nontraumatic acute subdural hemorrhage: Secondary | ICD-10-CM | POA: Diagnosis not present

## 2016-03-12 DIAGNOSIS — Z794 Long term (current) use of insulin: Secondary | ICD-10-CM

## 2016-03-12 DIAGNOSIS — Z9181 History of falling: Secondary | ICD-10-CM | POA: Diagnosis not present

## 2016-03-12 DIAGNOSIS — N390 Urinary tract infection, site not specified: Secondary | ICD-10-CM | POA: Diagnosis not present

## 2016-03-12 DIAGNOSIS — E782 Mixed hyperlipidemia: Secondary | ICD-10-CM | POA: Diagnosis present

## 2016-03-12 DIAGNOSIS — W06XXXA Fall from bed, initial encounter: Secondary | ICD-10-CM | POA: Diagnosis present

## 2016-03-12 DIAGNOSIS — S065X0A Traumatic subdural hemorrhage without loss of consciousness, initial encounter: Secondary | ICD-10-CM | POA: Diagnosis not present

## 2016-03-12 DIAGNOSIS — S065X9A Traumatic subdural hemorrhage with loss of consciousness of unspecified duration, initial encounter: Secondary | ICD-10-CM | POA: Diagnosis present

## 2016-03-12 DIAGNOSIS — Z8249 Family history of ischemic heart disease and other diseases of the circulatory system: Secondary | ICD-10-CM

## 2016-03-12 DIAGNOSIS — Z825 Family history of asthma and other chronic lower respiratory diseases: Secondary | ICD-10-CM

## 2016-03-12 DIAGNOSIS — S199XXA Unspecified injury of neck, initial encounter: Secondary | ICD-10-CM | POA: Diagnosis not present

## 2016-03-12 DIAGNOSIS — W19XXXA Unspecified fall, initial encounter: Secondary | ICD-10-CM | POA: Diagnosis not present

## 2016-03-12 DIAGNOSIS — Z96641 Presence of right artificial hip joint: Secondary | ICD-10-CM | POA: Diagnosis present

## 2016-03-12 DIAGNOSIS — D638 Anemia in other chronic diseases classified elsewhere: Secondary | ICD-10-CM | POA: Diagnosis present

## 2016-03-12 DIAGNOSIS — I6200 Nontraumatic subdural hemorrhage, unspecified: Secondary | ICD-10-CM | POA: Diagnosis not present

## 2016-03-12 DIAGNOSIS — Z1612 Extended spectrum beta lactamase (ESBL) resistance: Secondary | ICD-10-CM | POA: Diagnosis not present

## 2016-03-12 DIAGNOSIS — Z515 Encounter for palliative care: Secondary | ICD-10-CM | POA: Diagnosis not present

## 2016-03-12 DIAGNOSIS — R05 Cough: Secondary | ICD-10-CM | POA: Diagnosis not present

## 2016-03-12 DIAGNOSIS — F0151 Vascular dementia with behavioral disturbance: Secondary | ICD-10-CM | POA: Diagnosis not present

## 2016-03-12 DIAGNOSIS — Z833 Family history of diabetes mellitus: Secondary | ICD-10-CM

## 2016-03-12 DIAGNOSIS — F0390 Unspecified dementia without behavioral disturbance: Secondary | ICD-10-CM | POA: Diagnosis present

## 2016-03-12 DIAGNOSIS — Z87891 Personal history of nicotine dependence: Secondary | ICD-10-CM

## 2016-03-12 DIAGNOSIS — A498 Other bacterial infections of unspecified site: Secondary | ICD-10-CM | POA: Diagnosis not present

## 2016-03-12 DIAGNOSIS — I442 Atrioventricular block, complete: Secondary | ICD-10-CM | POA: Diagnosis present

## 2016-03-12 DIAGNOSIS — F419 Anxiety disorder, unspecified: Secondary | ICD-10-CM | POA: Diagnosis present

## 2016-03-12 DIAGNOSIS — R6889 Other general symptoms and signs: Secondary | ICD-10-CM | POA: Diagnosis not present

## 2016-03-12 DIAGNOSIS — E43 Unspecified severe protein-calorie malnutrition: Secondary | ICD-10-CM | POA: Diagnosis present

## 2016-03-12 DIAGNOSIS — Z23 Encounter for immunization: Secondary | ICD-10-CM | POA: Diagnosis not present

## 2016-03-12 DIAGNOSIS — L89892 Pressure ulcer of other site, stage 2: Secondary | ICD-10-CM | POA: Diagnosis present

## 2016-03-12 DIAGNOSIS — Z79899 Other long term (current) drug therapy: Secondary | ICD-10-CM

## 2016-03-12 DIAGNOSIS — Y92019 Unspecified place in single-family (private) house as the place of occurrence of the external cause: Secondary | ICD-10-CM | POA: Diagnosis not present

## 2016-03-12 DIAGNOSIS — S0083XA Contusion of other part of head, initial encounter: Secondary | ICD-10-CM | POA: Diagnosis not present

## 2016-03-12 DIAGNOSIS — T424X6A Underdosing of benzodiazepines, initial encounter: Secondary | ICD-10-CM | POA: Diagnosis present

## 2016-03-12 DIAGNOSIS — L89152 Pressure ulcer of sacral region, stage 2: Secondary | ICD-10-CM | POA: Diagnosis present

## 2016-03-12 DIAGNOSIS — N401 Enlarged prostate with lower urinary tract symptoms: Secondary | ICD-10-CM | POA: Diagnosis not present

## 2016-03-12 DIAGNOSIS — B961 Klebsiella pneumoniae [K. pneumoniae] as the cause of diseases classified elsewhere: Secondary | ICD-10-CM | POA: Diagnosis present

## 2016-03-12 DIAGNOSIS — R32 Unspecified urinary incontinence: Secondary | ICD-10-CM | POA: Diagnosis present

## 2016-03-12 DIAGNOSIS — I110 Hypertensive heart disease with heart failure: Secondary | ICD-10-CM | POA: Diagnosis present

## 2016-03-12 DIAGNOSIS — E1142 Type 2 diabetes mellitus with diabetic polyneuropathy: Secondary | ICD-10-CM | POA: Diagnosis present

## 2016-03-12 DIAGNOSIS — R278 Other lack of coordination: Secondary | ICD-10-CM | POA: Diagnosis not present

## 2016-03-12 DIAGNOSIS — L89610 Pressure ulcer of right heel, unstageable: Secondary | ICD-10-CM | POA: Diagnosis present

## 2016-03-12 DIAGNOSIS — R259 Unspecified abnormal involuntary movements: Secondary | ICD-10-CM | POA: Diagnosis not present

## 2016-03-12 DIAGNOSIS — I6203 Nontraumatic chronic subdural hemorrhage: Secondary | ICD-10-CM | POA: Diagnosis not present

## 2016-03-12 DIAGNOSIS — Z820 Family history of epilepsy and other diseases of the nervous system: Secondary | ICD-10-CM

## 2016-03-12 DIAGNOSIS — A414 Sepsis due to anaerobes: Secondary | ICD-10-CM | POA: Diagnosis not present

## 2016-03-12 LAB — COMPREHENSIVE METABOLIC PANEL
ALK PHOS: 104 U/L (ref 38–126)
ALT: 16 U/L — AB (ref 17–63)
ALT: 16 U/L — ABNORMAL LOW (ref 17–63)
AST: 36 U/L (ref 15–41)
AST: 24 U/L (ref 15–41)
Albumin: 3.2 g/dL — ABNORMAL LOW (ref 3.5–5.0)
Albumin: 2.5 g/dL — ABNORMAL LOW (ref 3.5–5.0)
Alkaline Phosphatase: 83 U/L (ref 38–126)
Anion gap: 7 (ref 5–15)
Anion gap: 7 (ref 5–15)
BILIRUBIN TOTAL: 0.5 mg/dL (ref 0.3–1.2)
BUN: 23 mg/dL — ABNORMAL HIGH (ref 6–20)
BUN: 23 mg/dL — ABNORMAL HIGH (ref 6–20)
CALCIUM: 8.3 mg/dL — AB (ref 8.9–10.3)
CHLORIDE: 111 mmol/L (ref 101–111)
CO2: 22 mmol/L (ref 22–32)
CO2: 20 mmol/L — ABNORMAL LOW (ref 22–32)
CREATININE: 1.3 mg/dL — AB (ref 0.61–1.24)
Calcium: 7.6 mg/dL — ABNORMAL LOW (ref 8.9–10.3)
Chloride: 106 mmol/L (ref 101–111)
Creatinine, Ser: 1.1 mg/dL (ref 0.61–1.24)
GFR calc Af Amer: 56 mL/min — ABNORMAL LOW (ref >60)
GFR, EST NON AFRICAN AMERICAN: 48 mL/min — AB (ref >60)
GFR, EST NON AFRICAN AMERICAN: 59 mL/min — AB (ref >60)
Glucose, Bld: 116 mg/dL — ABNORMAL HIGH (ref 65–99)
Glucose, Bld: 56 mg/dL — ABNORMAL LOW (ref 65–99)
POTASSIUM: 2.9 mmol/L — AB (ref 3.5–5.1)
Potassium: 3 mmol/L — ABNORMAL LOW (ref 3.5–5.1)
Sodium: 135 mmol/L (ref 135–145)
Sodium: 138 mmol/L (ref 135–145)
TOTAL PROTEIN: 6.9 g/dL (ref 6.5–8.1)
Total Bilirubin: 0.4 mg/dL (ref 0.3–1.2)
Total Protein: 5.6 g/dL — ABNORMAL LOW (ref 6.5–8.1)

## 2016-03-12 LAB — CBC WITH DIFFERENTIAL/PLATELET
BASOS ABS: 0 10*3/uL (ref 0.0–0.1)
BASOS ABS: 0 10*3/uL (ref 0.0–0.1)
BASOS PCT: 0 %
Basophils Relative: 0 %
EOS PCT: 0 %
EOS PCT: 0 %
Eosinophils Absolute: 0 10*3/uL (ref 0.0–0.7)
Eosinophils Absolute: 0 10*3/uL (ref 0.0–0.7)
HCT: 31.7 % — ABNORMAL LOW (ref 39.0–52.0)
HCT: 25.2 % — ABNORMAL LOW (ref 39.0–52.0)
Hemoglobin: 11 g/dL — ABNORMAL LOW (ref 13.0–17.0)
Hemoglobin: 8.9 g/dL — ABNORMAL LOW (ref 13.0–17.0)
LYMPHS PCT: 19 %
Lymphocytes Relative: 13 %
Lymphs Abs: 0.7 10*3/uL (ref 0.7–4.0)
Lymphs Abs: 0.6 10*3/uL — ABNORMAL LOW (ref 0.7–4.0)
MCH: 29.8 pg (ref 26.0–34.0)
MCH: 30.2 pg (ref 26.0–34.0)
MCHC: 34.7 g/dL (ref 30.0–36.0)
MCHC: 35.3 g/dL (ref 30.0–36.0)
MCV: 85.9 fL (ref 78.0–100.0)
MCV: 85.4 fL (ref 78.0–100.0)
MONO ABS: 0.2 10*3/uL (ref 0.1–1.0)
MONO ABS: 0.1 10*3/uL (ref 0.1–1.0)
MONOS PCT: 3 %
Monocytes Relative: 3 %
Neutro Abs: 4.6 10*3/uL (ref 1.7–7.7)
Neutro Abs: 2.5 10*3/uL (ref 1.7–7.7)
Neutrophils Relative %: 84 %
Neutrophils Relative %: 78 %
PLATELETS: 100 10*3/uL — AB (ref 150–400)
PLATELETS: 84 10*3/uL — AB (ref 150–400)
RBC: 3.69 MIL/uL — ABNORMAL LOW (ref 4.22–5.81)
RBC: 2.95 MIL/uL — ABNORMAL LOW (ref 4.22–5.81)
RDW: 15.1 % (ref 11.5–15.5)
RDW: 15 % (ref 11.5–15.5)
WBC: 5.5 10*3/uL (ref 4.0–10.5)
WBC: 3.2 10*3/uL — ABNORMAL LOW (ref 4.0–10.5)

## 2016-03-12 LAB — URINALYSIS, ROUTINE W REFLEX MICROSCOPIC
BILIRUBIN URINE: NEGATIVE
Glucose, UA: NEGATIVE mg/dL
Ketones, ur: NEGATIVE mg/dL
Nitrite: POSITIVE — AB
PROTEIN: 30 mg/dL — AB
Specific Gravity, Urine: 1.01 (ref 1.005–1.030)
pH: 7 (ref 5.0–8.0)

## 2016-03-12 LAB — GLUCOSE, CAPILLARY
GLUCOSE-CAPILLARY: 80 mg/dL (ref 65–99)
Glucose-Capillary: 25 mg/dL — CL (ref 65–99)

## 2016-03-12 LAB — PROTIME-INR
INR: 1.01
Prothrombin Time: 13.3 seconds (ref 11.4–15.2)

## 2016-03-12 LAB — CBG MONITORING, ED
Glucose-Capillary: 128 mg/dL — ABNORMAL HIGH (ref 65–99)
Glucose-Capillary: 157 mg/dL — ABNORMAL HIGH (ref 65–99)
Glucose-Capillary: 73 mg/dL (ref 65–99)
Glucose-Capillary: 52 mg/dL — ABNORMAL LOW (ref 65–99)
Glucose-Capillary: 113 mg/dL — ABNORMAL HIGH (ref 65–99)

## 2016-03-12 LAB — I-STAT CG4 LACTIC ACID, ED
LACTIC ACID, VENOUS: 1.29 mmol/L (ref 0.5–1.9)
LACTIC ACID, VENOUS: 0.43 mmol/L — AB (ref 0.5–1.9)

## 2016-03-12 LAB — MRSA PCR SCREENING: MRSA by PCR: NEGATIVE

## 2016-03-12 MED ORDER — DEXTROSE 50 % IV SOLN
50.0000 mL | Freq: Once | INTRAVENOUS | Status: AC
  Administered 2016-03-12: 50 mL via INTRAVENOUS

## 2016-03-12 MED ORDER — SODIUM CHLORIDE 0.9 % IV SOLN
INTRAVENOUS | Status: DC
  Administered 2016-03-12: 11:00:00 via INTRAVENOUS

## 2016-03-12 MED ORDER — DEXTROSE 50 % IV SOLN
INTRAVENOUS | Status: AC
  Administered 2016-03-12: 50 mL
  Filled 2016-03-12: qty 50

## 2016-03-12 MED ORDER — INSULIN ASPART 100 UNIT/ML ~~LOC~~ SOLN
0.0000 [IU] | Freq: Three times a day (TID) | SUBCUTANEOUS | Status: DC
  Administered 2016-03-15: 7 [IU] via SUBCUTANEOUS

## 2016-03-12 MED ORDER — DEXTROSE 5 % IV SOLN
1.0000 g | Freq: Two times a day (BID) | INTRAVENOUS | Status: DC
  Administered 2016-03-12 – 2016-03-15 (×6): 1 g via INTRAVENOUS
  Filled 2016-03-12 (×6): qty 1

## 2016-03-12 MED ORDER — DEXTROSE 5 % IV SOLN
2.0000 g | Freq: Once | INTRAVENOUS | Status: AC
  Administered 2016-03-12: 2 g via INTRAVENOUS
  Filled 2016-03-12: qty 2

## 2016-03-12 MED ORDER — DEXTROSE-NACL 5-0.9 % IV SOLN
INTRAVENOUS | Status: DC
  Administered 2016-03-12 (×2): via INTRAVENOUS

## 2016-03-12 MED ORDER — DEXTROSE 5 % IV SOLN
1.0000 g | Freq: Two times a day (BID) | INTRAVENOUS | Status: DC
  Filled 2016-03-12 (×2): qty 1

## 2016-03-12 MED ORDER — DEXTROSE 5 % IV SOLN: 1.0000 g | Freq: Two times a day (BID) | INTRAVENOUS | Status: DC

## 2016-03-12 MED ORDER — DEXTROSE 50 % IV SOLN
INTRAVENOUS | Status: AC
  Administered 2016-03-12: 18:00:00
  Filled 2016-03-12: qty 50

## 2016-03-12 MED ORDER — INFLUENZA VAC SPLIT QUAD 0.5 ML IM SUSY
0.5000 mL | PREFILLED_SYRINGE | INTRAMUSCULAR | Status: AC
  Administered 2016-03-15: 0.5 mL via INTRAMUSCULAR
  Filled 2016-03-12: qty 0.5

## 2016-03-12 MED ORDER — LORAZEPAM 2 MG/ML IJ SOLN
2.0000 mg | INTRAMUSCULAR | Status: DC | PRN
  Filled 2016-03-12: qty 1

## 2016-03-12 MED ORDER — SODIUM CHLORIDE 0.9% FLUSH
3.0000 mL | Freq: Two times a day (BID) | INTRAVENOUS | Status: DC
  Administered 2016-03-12 – 2016-03-20 (×14): 3 mL via INTRAVENOUS

## 2016-03-12 MED ORDER — DEXTROSE 50 % IV SOLN
50.0000 mL | Freq: Once | INTRAVENOUS | Status: AC
  Administered 2016-03-12: 50 mL via INTRAVENOUS
  Filled 2016-03-12: qty 50

## 2016-03-12 MED ORDER — SODIUM CHLORIDE 0.9 % IV BOLUS (SEPSIS)
500.0000 mL | Freq: Once | INTRAVENOUS | Status: AC
  Administered 2016-03-12: 500 mL via INTRAVENOUS

## 2016-03-12 MED ORDER — LEVETIRACETAM 500 MG/5ML IV SOLN
1500.0000 mg | Freq: Once | INTRAVENOUS | Status: AC
  Administered 2016-03-12: 1500 mg via INTRAVENOUS
  Filled 2016-03-12: qty 15

## 2016-03-12 MED ORDER — LORAZEPAM 2 MG/ML IJ SOLN
0.5000 mg | Freq: Once | INTRAMUSCULAR | Status: AC
  Administered 2016-03-12: 0.5 mg via INTRAVENOUS
  Filled 2016-03-12: qty 1

## 2016-03-12 NOTE — ED Notes (Signed)
Report given to [facility], RN-[facility] called for transport-family updated

## 2016-03-12 NOTE — Progress Notes (Signed)
Case was discussed with Dr. Effie ShyWentz at 1:35PM. Pt presented after a fall and has left sided acute on chronic SDH. He was also noted to have right upper extremity stiffness and becoming more drowsy. He responded to ativan. I have recommended him to be [facility]. Keppra loading dose of 20mg /kg. Continuous EEG will be needed however at least routine should be [facility] if there is a delay in transfer.

## 2016-03-12 NOTE — ED Notes (Signed)
Bed: [id number] Expected date:  Expected time:  Means of arrival:  Comments: EMS Elderly, Fall

## 2016-03-12 NOTE — Progress Notes (Signed)
EEG completed, results pending. 

## 2016-03-12 NOTE — ED Notes (Signed)
Hospitalist at bedside. EEG technician at bedside.

## 2016-03-12 NOTE — ED Notes (Signed)
Off floor for testing 

## 2016-03-12 NOTE — ED Notes (Signed)
Aaron MoseSteve Rossini- son- [phone]

## 2016-03-12 NOTE — Progress Notes (Addendum)
Pharmacy Antibiotic Note  Dakota White is a 83 y.o. male admitted on 03/12/2016 with seizures and sepsis thought due to urinary source.  Pharmacy has been consulted for Cefepime dosing. Several UTIs in 2017 d/t K pneumo sensitive to Ancef  Plan:  Cefepime 1 g IV q12 hr     Temp (24hrs), Avg:92.3 F (33.5 C), Min:92.3 F (33.5 C), Max:92.3 F (33.5 C)   Recent Labs Lab 03/12/16 0840 03/12/16 0842 03/12/16 1242 03/12/16 1718  WBC 5.5  --   --  3.2*  CREATININE 1.30*  --   --  1.10  LATICACIDVEN  --  1.29 0.43*  --     CrCl cannot be calculated (Unknown ideal weight.).    Allergies  Allergen Reactions  . Actos [Pioglitazone] Other (See Comments)    "makes me feel lousy and makes me bleed"  . Ramipril Other (See Comments)    "pt not aware of allergy"    Antimicrobials this admission: Cefepime 2/9 >>    Dose adjustments this admission: ---  Microbiology results: 2/9 BCx: sent 2/9 UCx: sent   Thank you for allowing pharmacy to be a part of this patient's care.  Bernadene Personrew Hannah Crill, PharmD, BCPS Pager: [phone] 03/12/2016, 8:33 PM

## 2016-03-12 NOTE — H&P (Addendum)
History and Physical    Dakota LambHowell W Stcharles ZOX:[id number] DOB: 1928/07/19  DOA: 03/12/2016 PCP: Mickie HillierIan McKeag, MD  Patient coming from: Home   Chief Complaint: Fall  HPI: Dakota White is a 83 y.o. male in by ambulance to the emergency department after he was found lying in the floor by his son. Duration on the floor unknown. Patient not responsive he was sedated with Ativan for possible seizure, history obtained from his son. He reported that patient is more confused than usual. Lately he's been battling with low blood sugars, to the point that he is Lantus has been modified as the sugars were "out all over the place". On EMS arrival blood sugar was found to be 35. Patient some states that patient has been eating and drinking regularly. Also report study of multiple UTIs.  ED Course: Upon ED arrival, patient was found to be obtunded with a left hand clonus/tremor. CT head was done showing acute on chronic subdural hemorrhage. EP consulted neurosurgery and neurology. Recommendations of loading with Keppra EEG were made. Patient was given Ativan in the ED. [facility].   Review of Systems:  Unable to perform due to patient not responsive   Past Medical History:  Diagnosis Date  . Allergic rhinitis   . Anemia of chronic disease   . Asthma with bronchitis   . BPH (benign prostatic hyperplasia)   . Cardiac pacemaker in situ   . Chronic combined systolic and diastolic CHF (congestive heart failure) (HCC)    a. EF noted to be down 10/2014 - 2D echo 10/17/14: EF 35-40%, mild LVH,  moderate HK of mid-apical anteroseptal and anterior myocardium, HK of inferior myocardium, grade 1 DD, mild MR.  . Complete heart block (HCC)    a. Requiring permanent [employer] DDD pacemaker by Dr. Ladona Ridgelaylor in 2013.  Marland Kitchen. Dementia   . Diabetes mellitus with renal complications (HCC)   . Essential hypertension   . General weakness   . Mental disorder   . Mixed  hyperlipidemia   . Peripheral neuropathy (HCC)   . Peripheral vascular disease, unspecified   . Thrombocytopenia (HCC)   . Type II or unspecified type diabetes mellitus without mention of complication, uncontrolled     Past Surgical History:  Procedure Laterality Date  . HIP ARTHROPLASTY Right 08/09/2015   Procedure: HIP HEMIARTHROPLASTY POSTERIOR;  Surgeon: Eldred MangesMark C Yates, MD;  [facility];  Service: Orthopedics;  Laterality: Right;  . PACEMAKER INSERTION  Dec 2013  . PERMANENT PACEMAKER INSERTION N/A 01/31/2012   Procedure: PERMANENT PACEMAKER INSERTION;  Surgeon: Marinus MawGregg W Taylor, MD;  [facility];  Service: Cardiovascular;  Laterality: N/A;  . ROTATOR CUFF REPAIR       reports that he has quit smoking. His smoking use included Cigarettes. He has never used smokeless tobacco. He reports that he does not drink alcohol or use drugs.  Allergies  Allergen Reactions  . Actos [Pioglitazone] Other (See Comments)    "makes me feel lousy and makes me bleed"  . Ramipril Other (See Comments)    "pt not aware of allergy"    Family History  Problem Relation Age of Onset  . Heart disease Mother   . Alzheimer's disease Father   . Diabetes Sister   . COPD Brother   . Alzheimer's disease Sister    Family history reviewed and non-pertinent  Prior to Admission medications   Medication Sig Start Date End Date Taking? Authorizing  Provider  carvedilol (COREG) 3.125 MG tablet TAKE 1 TABLET BY MOUTH TWICE DAILY 06/23/15  Yes Lyn Records, MD  finasteride (PROSCAR) 5 MG tablet TAKE 1 TABLET BY MOUTH EVERY DAY   Yes Elenora Gamma, MD  insulin glargine (LANTUS) 100 UNIT/ML injection Inject 0.35 mLs (35 Units total) into the skin at bedtime. Patient taking differently: Inject 20-50 Units into the skin daily as needed (high blood pressure).  08/14/15  Yes Zannie Cove, MD  ipratropium-albuterol (DUONEB) 0.5-2.5 (3) MG/3ML SOLN Take 3 mLs by nebulization 3 (three) times daily as needed  (Dyspnea/wheezing).    Yes Historical Provider, MD  [facility] DELICA LANCETS 33G MISC 1 Device by Does not apply route 4 (four) times daily as needed. 02/19/13  Yes Elenora Gamma, MD  acetaminophen (TYLENOL) 325 MG tablet Take 2 tablets (650 mg total) by mouth every 6 (six) hours as needed for mild pain (or Fever >/= 101). Patient not taking: Reported on 03/12/2016 08/14/15   Zannie Cove, MD  donepezil (ARICEPT) 10 MG tablet Take 1 tablet (10 mg total) by mouth at bedtime. Patient not taking: Reported on 03/12/2016 02/19/13   Elenora Gamma, MD  memantine (NAMENDA) 10 MG tablet TAKE 1 TABLET BY MOUTH EVERY NIGHT AT BEDTIME Patient not taking: Reported on 03/12/2016 06/23/15   Kathee Delton, MD  polyethylene glycol (MIRALAX / Ethelene Hal) packet Take 17 g by mouth daily as needed for mild constipation. Patient not taking: Reported on 03/12/2016 08/14/15   Zannie Cove, MD  sertraline (ZOLOFT) 100 MG tablet Take 1 tablet (100 mg total) by mouth at bedtime. Patient not taking: Reported on 03/12/2016 02/19/13   Elenora Gamma, MD    Physical Exam: Vitals:   03/12/16 1330 03/12/16 1400 03/12/16 1430 03/12/16 1436  BP: (!) 97/45 (!) 81/49 (!) 96/54 (!) 96/54  Pulse: 67 70 71 73  Resp:   26 26  Temp:    (!) 92.3 F (33.5 C)  TempSrc:    Core (Comment)  SpO2: 96% 90% 96% 97%     Constitutional: Non responsive due to sedation Eyes: PERRL ENMT: Mucous membranes are moist.  Neck: normal, supple, no masses, no thyromegaly Respiratory: clear to auscultation bilaterally, no wheezing, no crackles. Normal respiratory effort. No accessory muscle use.  Cardiovascular: Regular rate and rhythm, no murmurs / rubs / gallops. No extremity edema. 2+ pedal pulses.  Abdomen: Soft nondistended positive bowel sounds Genitourinary: Foley catheter in place urine, hazy and whitish Musculoskeletal: No gross abnormalities Skin: no rashes Neurologic: Unable to perform neurological exam due to sedation. DTRs normal     Labs on Admission: I have personally reviewed following labs and imaging studies  CBC:  Recent Labs Lab 03/12/16 0840  WBC 5.5  NEUTROABS 4.6  HGB 11.0*  HCT 31.7*  MCV 85.9  PLT 100*   Basic Metabolic Panel:  Recent Labs Lab 03/12/16 0840  NA 135  K 3.0*  CL 106  CO2 22  GLUCOSE 116*  BUN 23*  CREATININE 1.30*  CALCIUM 8.3*   GFR: CrCl cannot be calculated (Unknown ideal weight.). Liver Function Tests:  Recent Labs Lab 03/12/16 0840  AST 36  ALT 16*  ALKPHOS 104  BILITOT 0.5  PROT 6.9  ALBUMIN 3.2*   No results for input(s): LIPASE, AMYLASE in the last 168 hours. No results for input(s): AMMONIA in the last 168 hours. Coagulation Profile:  Recent Labs Lab 03/12/16 0840  INR 1.01   Cardiac Enzymes: No results for input(s): CKTOTAL,  CKMB, CKMBINDEX, TROPONINI in the last 168 hours. BNP (last 3 results) No results for input(s): PROBNP in the last 8760 hours. HbA1C: No results for input(s): HGBA1C in the last 72 hours. CBG:  Recent Labs Lab 03/12/16 0816 03/12/16 1432 03/12/16 1455  GLUCAP 128* <10* 157*   Urine analysis:    Component Value Date/Time   COLORURINE AMBER (A) 03/12/2016 1300   APPEARANCEUR TURBID (A) 03/12/2016 1300   LABSPEC 1.010 03/12/2016 1300   PHURINE 7.0 03/12/2016 1300   GLUCOSEU NEGATIVE 03/12/2016 1300   HGBUR MODERATE (A) 03/12/2016 1300   BILIRUBINUR NEGATIVE 03/12/2016 1300   BILIRUBINUR NEG 11/11/2014 1517   KETONESUR NEGATIVE 03/12/2016 1300   PROTEINUR 30 (A) 03/12/2016 1300   UROBILINOGEN 0.2 11/11/2014 1517   UROBILINOGEN 1.0 10/31/2014 2218   NITRITE POSITIVE (A) 03/12/2016 1300   LEUKOCYTESUR LARGE (A) 03/12/2016 1300    Radiological Exams on Admission: Ct Head Wo Contrast  Result Date: 03/12/2016 CLINICAL DATA:  Unwitnessed fall.  Found on floor. EXAM: CT HEAD WITHOUT CONTRAST CT CERVICAL SPINE WITHOUT CONTRAST TECHNIQUE: Multidetector CT imaging of the head and cervical spine was performed  following the standard protocol without intravenous contrast. Multiplanar CT image reconstructions of the cervical spine were also generated. COMPARISON:  08/14/2015 FINDINGS: CT HEAD FINDINGS Brain: Mixed density subdural fluid collection noted on the left measuring up to 17 mm in thickness compared to 10 mm previously, compatible with acute on chronic left subdural hematoma. No intraparenchymal hemorrhage or hydrocephalus. No acute infarction. Mild chronic microvascular disease throughout the deep white matter. No midline shift. Vascular: No hyperdense vessel or unexpected calcification. Skull: No acute calvarial abnormality. Sinuses/Orbits: Visualized paranasal sinuses and mastoids clear. Orbital soft tissues unremarkable. Other: None CT CERVICAL SPINE FINDINGS Alignment: Normal Skull base and vertebrae: No acute fracture. No primary bone lesion or focal pathologic process. Soft tissues and spinal canal: No prevertebral fluid or swelling. No visible canal hematoma. Disc levels: Degenerative disc disease changes at C5-6 and C6-7 with spurring and disc space narrowing. Upper chest: Negative Other: None IMPRESSION: Mixed density left subdural hematoma, enlarged since prior study compatible with acute on chronic subdural hematoma. No significant midline shift. Chronic microvascular disease. No acute findings in the cervical spine. Critical Value/emergent results were called by telephone at the time of interpretation on 03/12/2016 at 11:58 am to Dr. Mancel Bale , who verbally acknowledged these results. Electronically Signed   By: Charlett Nose M.D.   On: 03/12/2016 11:59   Ct Cervical Spine Wo Contrast  Result Date: 03/12/2016 CLINICAL DATA:  Unwitnessed fall.  Found on floor. EXAM: CT HEAD WITHOUT CONTRAST CT CERVICAL SPINE WITHOUT CONTRAST TECHNIQUE: Multidetector CT imaging of the head and cervical spine was performed following the standard protocol without intravenous contrast. Multiplanar CT image  reconstructions of the cervical spine were also generated. COMPARISON:  08/14/2015 FINDINGS: CT HEAD FINDINGS Brain: Mixed density subdural fluid collection noted on the left measuring up to 17 mm in thickness compared to 10 mm previously, compatible with acute on chronic left subdural hematoma. No intraparenchymal hemorrhage or hydrocephalus. No acute infarction. Mild chronic microvascular disease throughout the deep white matter. No midline shift. Vascular: No hyperdense vessel or unexpected calcification. Skull: No acute calvarial abnormality. Sinuses/Orbits: Visualized paranasal sinuses and mastoids clear. Orbital soft tissues unremarkable. Other: None CT CERVICAL SPINE FINDINGS Alignment: Normal Skull base and vertebrae: No acute fracture. No primary bone lesion or focal pathologic process. Soft tissues and spinal canal: No prevertebral fluid or swelling. No visible  canal hematoma. Disc levels: Degenerative disc disease changes at C5-6 and C6-7 with spurring and disc space narrowing. Upper chest: Negative Other: None IMPRESSION: Mixed density left subdural hematoma, enlarged since prior study compatible with acute on chronic subdural hematoma. No significant midline shift. Chronic microvascular disease. No acute findings in the cervical spine. Critical Value/emergent results were called by telephone at the time of interpretation on 03/12/2016 at 11:58 am to Dr. Mancel Bale , who verbally acknowledged these results. Electronically Signed   By: Charlett Nose M.D.   On: 03/12/2016 11:59   Dg Chest Port 1 View  Result Date: 03/12/2016 CLINICAL DATA:  Cough. EXAM: PORTABLE CHEST 1 VIEW COMPARISON:  Radiographs of August 16, 2015. FINDINGS: The heart size and mediastinal contours are within normal limits. Both lungs are clear. No pneumothorax or pleural effusion is noted. Left-sided pacemaker is unchanged in position. The visualized skeletal structures are unremarkable. IMPRESSION: No acute cardiopulmonary  abnormality seen. Electronically Signed   By: Lupita Raider, M.D.   On: 03/12/2016 09:30    EKG: Ordered  Assessment/Plan Subdural hematoma - acute on chronic subdural hematoma with no midline shift. Case discussed with neurosurgery and neurology will recommend transferred to [facility]. Patient with clonus seen by EDP thought to be a seizure. Was loaded with Keppra and given Ativan. EEG was performed. Neurological recommendations are as follow continues EEG when he got to [facility]. Neurochecks every one to 2 hours. Repeat CT scan 12 hours Ativan when necessary if seizures  Hypoglycemia - Patient has remaining hypoglycemic despite of D5 solution and few D10, unclear source at this point questionable is this is part of sepsis given hypothermia and gross UTI.  We'll start patient prophylactic antibiotic with cefepime, continue D5NS Follow-up urine and blood culture  Monitor CBG q2 hrs   UTI - mid criteria for sepsis in view of hypothermia, tachypnea and leukopenia with source of infection See above IV antibiotics   Mechanical Fall - Secondary to hypoglycemia and natural deconditioning from his dementia.   Severe Dementia - patient will benefit from palliative care consult, I will place an order for goals care patient has been declining for the past year  Palliative care consult.  DM type II - now hypoglycemic  Hold insulin  See above   DVT prophylaxis: SCD's Code Status: DNR Family Communication:  Disposition Plan: Anticipate discharge to previous home environment.  Consults called: Palliative care  Admission status: Patient will be transferred to [facility] for neurosurgical evaluation.   Latrelle Dodrill MD [facility] Pager: Text Page via [email or web address]  [phone]  If 7PM-7AM, please contact night-coverage [email or web address] Password [email or web address]  03/12/2016, 3:06 PM

## 2016-03-12 NOTE — Consult Note (Signed)
Call received [facility] for [facility] regarding patient Dakota White.  Patient presented to ER for fall Found to have acute on chronic subdural hematoma (17mm) without significant midline shift on CT scan Recommended [facility] via hospitalist service. Will eval patient upon arrival.  Dr Effie ShyWentz reports patient was possibly having a seizure and he wanted a neurology consult. States he will consult Neurology and transfer patient if possible.  He will call me with any issues getting [facility].

## 2016-03-12 NOTE — ED Triage Notes (Addendum)
Per EMS-family found patient on floor, time spent on floor unknown-fell out of bed-incontinent of urine-family states this is not a "new" thing-history of dementia-family states the change in his normal has been about 3 days-CBG 33 when EMS checked at scene-D 10 given via IV-CBG 100-patient cold to touch

## 2016-03-12 NOTE — ED Notes (Signed)
Error in SpO2-incorrect value

## 2016-03-12 NOTE — ED Notes (Signed)
Patient rectal temp 84.5

## 2016-03-12 NOTE — ED Provider Notes (Signed)
[facility]-EMERGENCY DEPT Provider Note   CSN: [id number] Arrival date & time: 03/12/16  [id number]     History   Chief Complaint Chief Complaint  Patient presents with  . Fall    HPI Dakota White is a 83 y.o. male.  Patient presents for evaluation of injuries from fall. His son found him lying on the floor. Duration on the floor, unknown. Son gives all the history. He states that the patient is much more confused than usual. He states that the patient has been having more episodes of low blood sugar recently than usual. Typically his blood sugar is high. His son has modified his chronic medications, as follows: He stopped anxiety medicines, blood pressure medicines and lowered his Lantus dosing. Currently, patient is receiving Lantus once a day, usually in the morning. Based on his blood sugar. Typically his son gives the patient about 30 units of Lantus each morning. He tends to check the blood sugar later in the day and once in the last week gave him a lower dose of Lantus, later in the day. Patient's son states that the patient eats and drinks regularly, having a problem with intake. Patient also urinates a lot. He has not seen his PCP recently.  Level V caveat- dementia  HPI  Past Medical History:  Diagnosis Date  . Allergic rhinitis   . Anemia of chronic disease   . Asthma with bronchitis   . BPH (benign prostatic hyperplasia)   . Cardiac pacemaker in situ   . Chronic combined systolic and diastolic CHF (congestive heart failure) (HCC)    a. EF noted to be down 10/2014 - 2D echo 10/17/14: EF 35-40%, mild LVH,  moderate HK of mid-apical anteroseptal and anterior myocardium, HK of inferior myocardium, grade 1 DD, mild MR.  . Complete heart block (HCC)    a. Requiring permanent [employer] DDD pacemaker by Dr. Ladona Ridgelaylor in 2013.  Marland Kitchen. Dementia   . Diabetes mellitus with renal complications (HCC)   . Essential hypertension   . General weakness   . Mental disorder   . Mixed hyperlipidemia   .  Peripheral neuropathy (HCC)   . Peripheral vascular disease, unspecified   . Thrombocytopenia (HCC)   . Type II or unspecified type diabetes mellitus without mention of complication, uncontrolled     Patient Active Problem List   Diagnosis Date Noted  . SDH (subdural hematoma) (HCC)   . Closed right hip fracture (HCC) 08/08/2015  . Hip fracture (HCC) 08/08/2015  . Pre-operative cardiovascular examination 08/08/2015  . Fall at home-(recurrent falls) 08/08/2015  . Malnutrition of moderate degree 08/08/2015  . Subdural hematoma (HCC)   . Mixed hyperlipidemia   . Anemia of chronic disease 11/03/2014  . Thrombocytopenia (HCC) 11/03/2014  . BPH (benign prostatic hyperplasia) 11/02/2014  . General weakness 11/02/2014  . UTI (lower urinary tract infection) 11/01/2014  . Chronic combined systolic and diastolic congestive heart failure (HCC) 11/01/2014  . Chronic kidney disease, stage III (moderate) 10/21/2014  . History of permanent cardiac pacemaker placement 08/20/2013  . DM (diabetes mellitus), type 2, uncontrolled, with renal complications (HCC) 02/01/2012    Class: Chronic  . Dementia 01/30/2012    Class: Chronic  . Essential hypertension 11/17/2007    Past Surgical History:  Procedure Laterality Date  . HIP ARTHROPLASTY Right 08/09/2015   Procedure: HIP HEMIARTHROPLASTY POSTERIOR;  Surgeon: Eldred MangesMark C Yates, MD;  [facility];  Service: Orthopedics;  Laterality: Right;  . PACEMAKER INSERTION  Dec 2013  . PERMANENT  PACEMAKER INSERTION N/A 01/31/2012   Procedure: PERMANENT PACEMAKER INSERTION;  Surgeon: Marinus Maw, MD;  Location: [facility] CATH LAB;  Service: Cardiovascular;  Laterality: N/A;  . ROTATOR CUFF REPAIR         Home Medications    Prior to Admission medications   Medication Sig Start Date End Date Taking? Authorizing Provider  carvedilol (COREG) 3.125 MG tablet TAKE 1 TABLET BY MOUTH TWICE DAILY 06/23/15  Yes Lyn Records, MD  finasteride (PROSCAR) 5 MG tablet TAKE 1  TABLET BY MOUTH EVERY DAY   Yes Elenora Gamma, MD  insulin glargine (LANTUS) 100 UNIT/ML injection Inject 0.35 mLs (35 Units total) into the skin at bedtime. Patient taking differently: Inject 20-50 Units into the skin daily as needed (high blood pressure).  08/14/15  Yes Zannie Cove, MD  ipratropium-albuterol (DUONEB) 0.5-2.5 (3) MG/3ML SOLN Take 3 mLs by nebulization 3 (three) times daily as needed (Dyspnea/wheezing).    Yes Historical Provider, MD  [facility] DELICA LANCETS 33G MISC 1 Device by Does not apply route 4 (four) times daily as needed. 02/19/13  Yes Elenora Gamma, MD  acetaminophen (TYLENOL) 325 MG tablet Take 2 tablets (650 mg total) by mouth every 6 (six) hours as needed for mild pain (or Fever >/= 101). Patient not taking: Reported on 03/12/2016 08/14/15   Zannie Cove, MD  donepezil (ARICEPT) 10 MG tablet Take 1 tablet (10 mg total) by mouth at bedtime. Patient not taking: Reported on 03/12/2016 02/19/13   Elenora Gamma, MD  memantine (NAMENDA) 10 MG tablet TAKE 1 TABLET BY MOUTH EVERY NIGHT AT BEDTIME Patient not taking: Reported on 03/12/2016 06/23/15   Kathee Delton, MD  polyethylene glycol (MIRALAX / Ethelene Hal) packet Take 17 g by mouth daily as needed for mild constipation. Patient not taking: Reported on 03/12/2016 08/14/15   Zannie Cove, MD  sertraline (ZOLOFT) 100 MG tablet Take 1 tablet (100 mg total) by mouth at bedtime. Patient not taking: Reported on 03/12/2016 02/19/13   Elenora Gamma, MD    Family History Family History  Problem Relation Age of Onset  . Heart disease Mother   . Alzheimer's disease Father   . Diabetes Sister   . COPD Brother   . Alzheimer's disease Sister     Social History Social History  Substance Use Topics  . Smoking status: Former Smoker    Types: Cigarettes  . Smokeless tobacco: Never Used  . Alcohol use No     Allergies   Actos [pioglitazone] and Ramipril   Review of Systems Review of Systems  Unable to perform ROS:  Dementia     Physical Exam Updated Vital Signs BP 97/56   Pulse 73   Temp (!) 92.3 F (33.5 C) (Core (Comment)) Comment (Src): temperature foley  Resp 22   SpO2 97%   Physical Exam  Constitutional: He appears well-developed.  Elderly, frail  HENT:  Head: Normocephalic.  Right Ear: External ear normal.  Left Ear: External ear normal.  Small contusion right forehead. No associated abrasion or laceration. Blood in right nares without nasal swelling or deformity. No trismus. Oral mucous membranes are very dry.  Eyes: Conjunctivae and EOM are normal. Pupils are equal, round, and reactive to light.  Neck: Normal range of motion and phonation normal. Neck supple.  Cardiovascular: Normal rate, regular rhythm and normal heart sounds.   Pulmonary/Chest: Effort normal and breath sounds normal. He exhibits tenderness (Left anterior, without crepitation). He exhibits no bony tenderness.  Abdominal: Soft.  There is no tenderness.  Musculoskeletal: He exhibits no deformity.  Neurological: He is alert. He exhibits normal muscle tone. Coordination normal.  Skin: Skin is warm, dry and intact.  Psychiatric:  Agitated  Nursing note and vitals reviewed.    ED Treatments / Results  Labs (all labs ordered are listed, but only abnormal results are displayed) Labs Reviewed  COMPREHENSIVE METABOLIC PANEL - Abnormal; Notable for the following:       Result Value   Potassium 3.0 (*)    Glucose, Bld 116 (*)    BUN 23 (*)    Creatinine, Ser 1.30 (*)    Calcium 8.3 (*)    Albumin 3.2 (*)    ALT 16 (*)    GFR calc non Af Amer 48 (*)    GFR calc Af Amer 56 (*)    All other components within normal limits  CBC WITH DIFFERENTIAL/PLATELET - Abnormal; Notable for the following:    RBC 3.69 (*)    Hemoglobin 11.0 (*)    HCT 31.7 (*)    Platelets 100 (*)    All other components within normal limits  URINALYSIS, ROUTINE W REFLEX MICROSCOPIC - Abnormal; Notable for the following:    Color, Urine  AMBER (*)    APPearance TURBID (*)    Hgb urine dipstick MODERATE (*)    Protein, ur 30 (*)    Nitrite POSITIVE (*)    Leukocytes, UA LARGE (*)    Bacteria, UA MANY (*)    Squamous Epithelial / LPF 0-5 (*)    All other components within normal limits  CBC WITH DIFFERENTIAL/PLATELET - Abnormal; Notable for the following:    WBC 3.2 (*)    RBC 2.95 (*)    Hemoglobin 8.9 (*)    HCT 25.2 (*)    Platelets 84 (*)    Lymphs Abs 0.6 (*)    All other components within normal limits  CBG MONITORING, ED - Abnormal; Notable for the following:    Glucose-Capillary 128 (*)    All other components within normal limits  CBG MONITORING, ED - Abnormal; Notable for the following:    Glucose-Capillary <10 (*)    All other components within normal limits  I-STAT CG4 LACTIC ACID, ED - Abnormal; Notable for the following:    Lactic Acid, Venous 0.43 (*)    All other components within normal limits  CBG MONITORING, ED - Abnormal; Notable for the following:    Glucose-Capillary 157 (*)    All other components within normal limits  CBG MONITORING, ED - Abnormal; Notable for the following:    Glucose-Capillary 52 (*)    All other components within normal limits  CULTURE, BLOOD (ROUTINE X 2)  CULTURE, BLOOD (ROUTINE X 2)  PROTIME-INR  COMPREHENSIVE METABOLIC PANEL  I-STAT CG4 LACTIC ACID, ED  CBG MONITORING, ED    EKG  EKG Interpretation  Date/Time:  Friday March 12 2016 08:44:32 EST Ventricular Rate:  61 PR Interval:    QRS Duration: 114 QT Interval:  578 QTC Calculation: 578 R Axis:   0 Text Interpretation:  A-V dual-paced rhythm with some inhibition No further analysis attempted due to paced rhythm Since last tracing of earlier today Atrial pacing now present as well Confirmed by Treylin Burtch  MD, Robyn Nohr [phone]) on 03/12/2016 5:37:30 PM       Radiology Ct Head Wo Contrast  Result Date: 03/12/2016 CLINICAL DATA:  Unwitnessed fall.  Found on floor. EXAM: CT HEAD WITHOUT CONTRAST CT CERVICAL  SPINE WITHOUT  CONTRAST TECHNIQUE: Multidetector CT imaging of the head and cervical spine was performed following the standard protocol without intravenous contrast. Multiplanar CT image reconstructions of the cervical spine were also generated. COMPARISON:  08/14/2015 FINDINGS: CT HEAD FINDINGS Brain: Mixed density subdural fluid collection noted on the left measuring up to 17 mm in thickness compared to 10 mm previously, compatible with acute on chronic left subdural hematoma. No intraparenchymal hemorrhage or hydrocephalus. No acute infarction. Mild chronic microvascular disease throughout the deep white matter. No midline shift. Vascular: No hyperdense vessel or unexpected calcification. Skull: No acute calvarial abnormality. Sinuses/Orbits: Visualized paranasal sinuses and mastoids clear. Orbital soft tissues unremarkable. Other: None CT CERVICAL SPINE FINDINGS Alignment: Normal Skull base and vertebrae: No acute fracture. No primary bone lesion or focal pathologic process. Soft tissues and spinal canal: No prevertebral fluid or swelling. No visible canal hematoma. Disc levels: Degenerative disc disease changes at C5-6 and C6-7 with spurring and disc space narrowing. Upper chest: Negative Other: None IMPRESSION: Mixed density left subdural hematoma, enlarged since prior study compatible with acute on chronic subdural hematoma. No significant midline shift. Chronic microvascular disease. No acute findings in the cervical spine. Critical Value/emergent results were called by telephone at the time of interpretation on 03/12/2016 at 11:58 am to Dr. Mancel Bale , who verbally acknowledged these results. Electronically Signed   By: Charlett Nose M.D.   On: 03/12/2016 11:59   Ct Cervical Spine Wo Contrast  Result Date: 03/12/2016 CLINICAL DATA:  Unwitnessed fall.  Found on floor. EXAM: CT HEAD WITHOUT CONTRAST CT CERVICAL SPINE WITHOUT CONTRAST TECHNIQUE: Multidetector CT imaging of the head and cervical spine was  performed following the standard protocol without intravenous contrast. Multiplanar CT image reconstructions of the cervical spine were also generated. COMPARISON:  08/14/2015 FINDINGS: CT HEAD FINDINGS Brain: Mixed density subdural fluid collection noted on the left measuring up to 17 mm in thickness compared to 10 mm previously, compatible with acute on chronic left subdural hematoma. No intraparenchymal hemorrhage or hydrocephalus. No acute infarction. Mild chronic microvascular disease throughout the deep white matter. No midline shift. Vascular: No hyperdense vessel or unexpected calcification. Skull: No acute calvarial abnormality. Sinuses/Orbits: Visualized paranasal sinuses and mastoids clear. Orbital soft tissues unremarkable. Other: None CT CERVICAL SPINE FINDINGS Alignment: Normal Skull base and vertebrae: No acute fracture. No primary bone lesion or focal pathologic process. Soft tissues and spinal canal: No prevertebral fluid or swelling. No visible canal hematoma. Disc levels: Degenerative disc disease changes at C5-6 and C6-7 with spurring and disc space narrowing. Upper chest: Negative Other: None IMPRESSION: Mixed density left subdural hematoma, enlarged since prior study compatible with acute on chronic subdural hematoma. No significant midline shift. Chronic microvascular disease. No acute findings in the cervical spine. Critical Value/emergent results were called by telephone at the time of interpretation on 03/12/2016 at 11:58 am to Dr. Mancel Bale , who verbally acknowledged these results. Electronically Signed   By: Charlett Nose M.D.   On: 03/12/2016 11:59   Dg Chest Port 1 View  Result Date: 03/12/2016 CLINICAL DATA:  Cough. EXAM: PORTABLE CHEST 1 VIEW COMPARISON:  Radiographs of August 16, 2015. FINDINGS: The heart size and mediastinal contours are within normal limits. Both lungs are clear. No pneumothorax or pleural effusion is noted. Left-sided pacemaker is unchanged in position. The  visualized skeletal structures are unremarkable. IMPRESSION: No acute cardiopulmonary abnormality seen. Electronically Signed   By: Lupita Raider, M.D.   On: 03/12/2016 09:30    Procedures Procedures (  including critical care time)  Medications Ordered in ED Medications  LORazepam (ATIVAN) injection 2 mg (not administered)  sodium chloride flush (NS) 0.9 % injection 3 mL (3 mLs Intravenous Given 03/12/16 1522)  insulin aspart (novoLOG) injection 0-9 Units (0 Units Subcutaneous Hold 03/12/16 1719)  dextrose 5 %-0.9 % sodium chloride infusion ( Intravenous New Bag/Given 03/12/16 1523)  dextrose 50 % solution (not administered)  sodium chloride 0.9 % bolus 500 mL (0 mLs Intravenous Stopped 03/12/16 1059)  LORazepam (ATIVAN) injection 0.5 mg (0.5 mg Intravenous Given 03/12/16 1250)  levETIRAcetam (KEPPRA) 1,500 mg in sodium chloride 0.9 % 100 mL IVPB (0 mg Intravenous Stopped 03/12/16 1418)  cefTRIAXone (ROCEPHIN) 2 g in dextrose 5 % 50 mL IVPB (0 g Intravenous Stopped 03/12/16 1500)  dextrose 50 % solution 50 mL (50 mLs Intravenous Given 03/12/16 1436)  dextrose 50 % solution 50 mL (50 mLs Intravenous Given 03/12/16 1724)     Initial Impression / Assessment and Plan / ED Course  I have reviewed the triage vital signs and the nursing notes.  Pertinent labs & imaging results that were available during my care of the patient were reviewed by me and considered in my medical decision making (see chart for details).  Clinical Course as of Mar 13 1755  Fri Mar 12, 2016  1045 Hypothermia treated with warming blanket  [EW]  1238 At this time, nursing has noticed that he is less responsive than previously. Patient responds to painful stimuli of chest rub, by flexing both arms. Left hand is clenched, and he has mild clonus of the left arm, without rhythmic muscle contractions. Pupils remain 3 mm and reactive bilaterally. Ativan ordered for possible seizure activity.  [EW]  1312 I confirmed with the patient's son  that he is DNR/DNI.  [EW]  1312 Case discussed with neurosurgery physician's assistant for Dr. Bevely Palmer. He requests that the patient be admitted to [facility] hospital, by the hospitalist, and that they will see him as a Research scientist (medical). They are. He recommended starting the patient on Keppra, for seizure.  [EW]  1336 Case discussed with neural hospitalist, who recommends loading with Keppra 20 mg/kg, and he will see the patient as a Research scientist (medical). He recommends transferring the patient to [facility] hospital, but in the meantime order a routine EEG.  [EW]  1513 D50 was given for Hypoglycemia at 14:33- No improvement in mentation at this time.  [EW]    Clinical Course User Index [EW] Mancel Bale, MD    Medications  LORazepam (ATIVAN) injection 2 mg (not administered)  sodium chloride flush (NS) 0.9 % injection 3 mL (3 mLs Intravenous Given 03/12/16 1522)  insulin aspart (novoLOG) injection 0-9 Units (0 Units Subcutaneous Hold 03/12/16 1719)  dextrose 5 %-0.9 % sodium chloride infusion ( Intravenous New Bag/Given 03/12/16 1523)  dextrose 50 % solution (not administered)  sodium chloride 0.9 % bolus 500 mL (0 mLs Intravenous Stopped 03/12/16 1059)  LORazepam (ATIVAN) injection 0.5 mg (0.5 mg Intravenous Given 03/12/16 1250)  levETIRAcetam (KEPPRA) 1,500 mg in sodium chloride 0.9 % 100 mL IVPB (0 mg Intravenous Stopped 03/12/16 1418)  cefTRIAXone (ROCEPHIN) 2 g in dextrose 5 % 50 mL IVPB (0 g Intravenous Stopped 03/12/16 1500)  dextrose 50 % solution 50 mL (50 mLs Intravenous Given 03/12/16 1436)  dextrose 50 % solution 50 mL (50 mLs Intravenous Given 03/12/16 1724)    Patient Vitals for the past 24 hrs:  BP Temp Temp src Pulse Resp SpO2  03/12/16 1600 97/56 - - - - -  03/12/16 1530 114/71 - - - 22 -  03/12/16 1500 100/55 - - - 21 -  03/12/16 1436 (!) 96/54 (!) 92.3 F (33.5 C) Core 73 26 97 %  03/12/16 1430 (!) 96/54 - - 71 26 96 %  03/12/16 1400 (!) 81/49 - - 70 - 90 %  03/12/16 1330 (!) 97/45 - - 67 - 96 %  03/12/16 1230  105/69 - - - - -  03/12/16 1130 (!) 106/54 - - - 20 -  03/12/16 1030 104/63 - - 105 14 (!) 85 %  03/12/16 1021 - - Rectal - - -  03/12/16 1000 (!) 153/109 - - - 20 -  03/12/16 0930 (!) 139/102 - - - 21 -  03/12/16 0900 151/79 - - - 21 -  03/12/16 0805 137/69 - - 61 18 100 %      2:00 PM-Consult complete with Hospitalist. Patient case explained and discussed. He agrees to admit patient for further evaluation and treatment. Call ended at 14:10   CRITICAL CARE Performed by: Flint Melter Total critical care time: 50 minutes Critical care time was exclusive of separately billable procedures and treating other patients. Critical care was necessary to treat or prevent imminent or life-threatening deterioration. Critical care was time spent personally by me on the following activities: development of treatment plan with patient and/or surrogate as well as nursing, discussions with consultants, evaluation of patient's response to treatment, examination of patient, obtaining history from patient or surrogate, ordering and performing treatments and interventions, ordering and review of laboratory studies, ordering and review of radiographic studies, pulse oximetry and re-evaluation of patient's condition.   Final Clinical Impressions(s) / ED Diagnoses   Final diagnoses:  Hypothermia, initial encounter  Subdural hematoma (HCC)  Urinary tract infection without hematuria, site unspecified  Seizure (HCC)    Fall from bed, without known inciting cause. The patient is debilitated, and typically does not ambulate. His son is his caregiver. The patient typically gets out of bed to a wheelchair, then to a recliner with assistance. Marked hypothermia, without hypotension, or other markers of sepsis. Initial lactate normal. Hypothermia likely secondary to hypoglycemia, subacute.  Doubt serious bacterial infection. Metabolic instability or impending vascular collapse.  Nursing Notes Reviewed/ Care  Coordinated, and agree without changes. Applicable Imaging Reviewed.  Interpretation of Laboratory Data incorporated into ED treatment  Plan: Admit  New Prescriptions New Prescriptions   No medications on file     Mancel Bale, MD 03/12/16 1758

## 2016-03-12 NOTE — ED Notes (Addendum)
Bear hugger placed on patient.

## 2016-03-13 ENCOUNTER — Inpatient Hospital Stay (HOSPITAL_COMMUNITY): Payer: Medicare Other

## 2016-03-13 DIAGNOSIS — W19XXXA Unspecified fall, initial encounter: Secondary | ICD-10-CM

## 2016-03-13 DIAGNOSIS — Z515 Encounter for palliative care: Secondary | ICD-10-CM

## 2016-03-13 DIAGNOSIS — R059 Cough, unspecified: Secondary | ICD-10-CM

## 2016-03-13 DIAGNOSIS — R05 Cough: Secondary | ICD-10-CM

## 2016-03-13 DIAGNOSIS — R569 Unspecified convulsions: Secondary | ICD-10-CM

## 2016-03-13 LAB — MAGNESIUM: Magnesium: 1.4 mg/dL — ABNORMAL LOW (ref 1.7–2.4)

## 2016-03-13 LAB — GLUCOSE, CAPILLARY
GLUCOSE-CAPILLARY: 126 mg/dL — AB (ref 65–99)
GLUCOSE-CAPILLARY: 24 mg/dL — AB (ref 65–99)
GLUCOSE-CAPILLARY: 53 mg/dL — AB (ref 65–99)
GLUCOSE-CAPILLARY: 61 mg/dL — AB (ref 65–99)
GLUCOSE-CAPILLARY: 74 mg/dL (ref 65–99)
GLUCOSE-CAPILLARY: 82 mg/dL (ref 65–99)
Glucose-Capillary: 30 mg/dL — CL (ref 65–99)
Glucose-Capillary: 56 mg/dL — ABNORMAL LOW (ref 65–99)
Glucose-Capillary: 26 mg/dL — CL (ref 65–99)
Glucose-Capillary: 61 mg/dL — ABNORMAL LOW (ref 65–99)
Glucose-Capillary: 93 mg/dL (ref 65–99)

## 2016-03-13 LAB — BLOOD CULTURE ID PANEL (REFLEXED)
ACINETOBACTER BAUMANNII: NOT DETECTED
CANDIDA GLABRATA: NOT DETECTED
CANDIDA KRUSEI: NOT DETECTED
Candida albicans: NOT DETECTED
Candida parapsilosis: NOT DETECTED
Candida tropicalis: NOT DETECTED
ENTEROBACTER CLOACAE COMPLEX: NOT DETECTED
ENTEROCOCCUS SPECIES: NOT DETECTED
ESCHERICHIA COLI: NOT DETECTED
Enterobacteriaceae species: NOT DETECTED
Haemophilus influenzae: NOT DETECTED
Klebsiella oxytoca: NOT DETECTED
Klebsiella pneumoniae: NOT DETECTED
LISTERIA MONOCYTOGENES: NOT DETECTED
Neisseria meningitidis: NOT DETECTED
PSEUDOMONAS AERUGINOSA: NOT DETECTED
Proteus species: NOT DETECTED
SERRATIA MARCESCENS: NOT DETECTED
STAPHYLOCOCCUS AUREUS BCID: NOT DETECTED
STREPTOCOCCUS SPECIES: NOT DETECTED
Staphylococcus species: NOT DETECTED
Streptococcus agalactiae: NOT DETECTED
Streptococcus pneumoniae: NOT DETECTED
Streptococcus pyogenes: NOT DETECTED

## 2016-03-13 LAB — TSH: TSH: 5.128 u[IU]/mL — AB (ref 0.350–4.500)

## 2016-03-13 LAB — T4, FREE: FREE T4: 1 ng/dL (ref 0.61–1.12)

## 2016-03-13 LAB — CORTISOL: Cortisol, Plasma: 12.3 ug/dL

## 2016-03-13 MED ORDER — GLUCAGON HCL RDNA (DIAGNOSTIC) 1 MG IJ SOLR
INTRAMUSCULAR | Status: AC
  Administered 2016-03-13: 1 mg via INTRAVENOUS
  Filled 2016-03-13: qty 1

## 2016-03-13 MED ORDER — SODIUM CHLORIDE 0.9 % IV SOLN
500.0000 mg | Freq: Two times a day (BID) | INTRAVENOUS | Status: AC
  Administered 2016-03-13 – 2016-03-19 (×14): 500 mg via INTRAVENOUS
  Filled 2016-03-13 (×15): qty 5

## 2016-03-13 MED ORDER — MAGNESIUM SULFATE 2 GM/50ML IV SOLN
2.0000 g | Freq: Once | INTRAVENOUS | Status: AC
  Administered 2016-03-13: 2 g via INTRAVENOUS
  Filled 2016-03-13: qty 50

## 2016-03-13 MED ORDER — DEXTROSE-NACL 5-0.9 % IV SOLN
INTRAVENOUS | Status: DC
  Administered 2016-03-13 – 2016-03-14 (×3): via INTRAVENOUS
  Filled 2016-03-13 (×5): qty 1000

## 2016-03-13 MED ORDER — DEXTROSE 50 % IV SOLN
INTRAVENOUS | Status: AC
  Administered 2016-03-13: 50 mL
  Filled 2016-03-13: qty 50

## 2016-03-13 MED ORDER — DEXTROSE 10 % IV SOLN
INTRAVENOUS | Status: DC
  Administered 2016-03-13: 03:00:00 via INTRAVENOUS

## 2016-03-13 MED ORDER — ORAL CARE MOUTH RINSE
15.0000 mL | Freq: Two times a day (BID) | OROMUCOSAL | Status: DC
  Administered 2016-03-13 – 2016-03-20 (×14): 15 mL via OROMUCOSAL

## 2016-03-13 MED ORDER — SODIUM CHLORIDE 0.9 % IV SOLN
30.0000 meq | Freq: Once | INTRAVENOUS | Status: AC
  Administered 2016-03-13: 30 meq via INTRAVENOUS
  Filled 2016-03-13: qty 15

## 2016-03-13 MED ORDER — GLUCAGON HCL RDNA (DIAGNOSTIC) 1 MG IJ SOLR
1.0000 mg | Freq: Once | INTRAMUSCULAR | Status: AC | PRN
  Administered 2016-03-13: 1 mg via INTRAVENOUS

## 2016-03-13 MED ORDER — MORPHINE SULFATE (PF) 2 MG/ML IV SOLN
1.0000 mg | INTRAVENOUS | Status: DC | PRN
  Administered 2016-03-13 (×2): 1 mg via INTRAVENOUS
  Filled 2016-03-13 (×2): qty 1

## 2016-03-13 NOTE — Progress Notes (Addendum)
[facility] being found by family on floor. Found to have acute on chronic subdural hematoma on CT Consult ordered by Dr Effie ShyWentz [facility])  Pt seen and examined. He has dementia and unable to give history Daughter present in exam room & able to provide history.  Reports pt was found on floor, incontinent of urine. [facility] and found to have acute on chronic SDH.  Transferred here for management. [facility] so Neuro consulted for management. Daughter reports dementia gradually worsening over the last several years. He lives at home with son.  Pt complains of right foot pain but no other pain.  Pt is a DNR.  Daughter is unsure if her or her brother would like anything done at this time for hematoma.  EXAM: Temp:  [92.3 F (33.5 C)-98.8 F (37.1 C)] 96.8 F (36 C) (02/10 0801) Pulse Rate:  [67-105] 85 (02/10 0400) Resp:  [14-27] 20 (02/10 0801) BP: (81-153)/(45-109) 100/66 (02/10 0801) SpO2:  [85 %-100 %] 99 % (02/10 0400) Weight:  [64.8 kg (142 lb 13.7 oz)] 64.8 kg (142 lb 13.7 oz) (02/09 2031) Intake/Output      02/09 0701 - 02/10 0700 02/10 0701 - 02/11 0700   I.V. (mL/kg) 3442.2 (53.1)    IV Piggyback 805    Total Intake(mL/kg) 4247.2 (65.5)    Urine (mL/kg/hr) 600    Total Output 600     Net +3647.2           Awake and alert Communicates with mumbling. No clear speech.  Follows commands throughout Moves all extremities.  CN grossly intact  Impression  Waynetta PeanHowell Sottile with acute on chronic subdural hematoma  Plan  Discussed at length with daughter risks/benefits of craniotomy Daughter would like to consult brother Will call me either way with their decision Time spent: 30 minutes counseling  I have reviewed and agree with the above.

## 2016-03-13 NOTE — Procedures (Signed)
EEG Report Date 03/12/16 Referring physician Luan PullingQaiser Toqeer  Reason for the study- [id number] male presented with a fall resulting in subdural hamartoma and seizure  Medication- Keppra Cefepime Ativan Insulin dextrose  Technical-This is a multichannel digital EEG recording using the international 10-20 placement system.  Description of the recording Posterior background is 4-5hz  EEG comprises of low amplitude delta slowing intermixed with beta activity Epileptiform activity not seen during this recording  Impression This is a abnormal EEG and findings are are suggestive of Generalized cerebral dysfunction Low amplitude EEG consistent with patient history of subdural hamartoma

## 2016-03-13 NOTE — Evaluation (Signed)
Physical Therapy Evaluation Patient Details Name: Dakota White MRN: [id number] DOB: Jan 14, 1929 Today's Date: 03/13/2016   History of Present Illness  87-yo male with a pmh significant for dementia, history of fall with hipsx in 2017. Patient has underlying history of recurrent urinary tract infections, falls, history of chronic subdural brain bleeds. Presents to [facility] with new onset confusion, continued weakness and unwitnessed fall.  Clinical Impression  Patient demonstrates deficits in functional mobility as indicated below. Will need continued skilled PT to address deficits and maximize function. Will see as indicated and progress as tolerated.  At this time, unsure of patient's ability to make measurable gains, but will trial PT acutely. May need to consider equipment needs if plan to return home with 24/7 will need hospital bed, and possibly hoyer lift. If family unable to provide 24/7 may need to consider SNF placement for potential decrease in burden of care.     Follow Up Recommendations Supervision/Assistance - 24 hour (Hospice vs SNF?)    Equipment Recommendations  Hospital bed (hoter lift)    Recommendations for Other Services       Precautions / Restrictions Precautions Precautions: Fall      Mobility  Bed Mobility Overal bed mobility: Needs Assistance Bed Mobility: Rolling;Sidelying to Sit;Sit to Supine Rolling: Mod assist;+2 for physical assistance Sidelying to sit: +2 for physical assistance;Max assist   Sit to supine: Max assist;+2 for physical assistance   General bed mobility comments: Moderate assist to begin roll, patient able to follow simple cues to intiate LEs towrads EOB, multi modal cues with hand over hand facilitation to roll with assist, and 2 person max assist to elevate trunk to upright position. Heavy lateral lean noted.  Transfers                 General transfer comment: attempted, unable to perform  Ambulation/Gait                 Stairs            Wheelchair Mobility    Modified Rankin (Stroke Patients Only)       Balance Overall balance assessment: Needs assistance;History of Falls Sitting-balance support: Bilateral upper extremity supported;Feet supported Sitting balance-Leahy Scale: Poor Sitting balance - Comments: min to moderate assist to maintain midline, heavy R lateral lean Postural control: Right lateral lean     Standing balance comment: unable to perform                             Pertinent Vitals/Pain Pain Assessment: Faces Faces Pain Scale: Hurts even more Pain Location: right hip and bilateral foot pain Pain Descriptors / Indicators: Discomfort;Grimacing;Guarding Pain Intervention(s): Limited activity within patient's tolerance    Home Living Family/patient expects to be discharged to:: Private residence Living Arrangements: Children Available Help at Discharge: Personal care attendant Type of Home: House Home Access: Stairs to enter     Home Layout: One level Home Equipment: Environmental consultant - 2 wheels;Bedside commode;Wheelchair - manual Additional Comments: has caregiver [phone], son and grandson stay at night.    Prior Function Level of Independence: Needs assistance         Comments: family reports significant R Hip paitn at baseline     Hand Dominance   Dominant Hand: Right    Extremity/Trunk Assessment   Upper Extremity Assessment Upper Extremity Assessment: Generalized weakness    Lower Extremity Assessment Lower Extremity Assessment: Generalized weakness;RLE deficits/detail;Difficult to assess due to  impaired cognition RLE Deficits / Details: gross weakness 2/5 against gravity for hip flexion. all other motions 3/5 RLE: Unable to fully assess due to pain RLE Coordination: decreased fine motor;decreased gross motor    Cervical / Trunk Assessment Cervical / Trunk Assessment: Kyphotic  Communication   Communication: HOH  Cognition  Arousal/Alertness: Awake/alert Behavior During Therapy: Flat affect Overall Cognitive Status: History of cognitive impairments - at baseline                      General Comments      Exercises     Assessment/Plan    PT Assessment Patient needs continued PT services  PT Problem List Decreased strength;Decreased range of motion;Decreased activity tolerance;Decreased balance;Decreased mobility;Decreased coordination;Decreased cognition;Decreased safety awareness;Pain          PT Treatment Interventions DME instruction;Gait training;Functional mobility training;Therapeutic activities;Therapeutic exercise;Balance training;Neuromuscular re-education;Cognitive remediation;Patient/family education    PT Goals (Current goals can be found in the Care Plan section)  Acute Rehab PT Goals Patient Stated Goal: none stated PT Goal Formulation: Patient unable to participate in goal setting Time For Goal Achievement: 03/27/16 Potential to Achieve Goals: Fair    Frequency Min 2X/week   Barriers to discharge        Co-evaluation               End of Session   Activity Tolerance: Patient limited by pain Patient left: in bed;with call bell/phone within reach;with bed alarm set;with family/visitor present Nurse Communication: Mobility status         Time: [id number] PT Time Calculation (min) (ACUTE ONLY): 23 min   Charges:   PT Evaluation $PT Eval Moderate Complexity: 1 Procedure PT Treatments $Therapeutic Activity: 8-22 mins   PT G Codes:        Fabio AsaDevon J Kirston Luty 03/13/2016, 3:32 PM  Charlotte Crumbevon Arnold Depinto, PT DPT  [phone]

## 2016-03-13 NOTE — Plan of Care (Signed)
Problem: Education: Goal: Knowledge of [facility] Education information/materials will improve Outcome: Progressing Discussed with patient and his daughter at length about his plan of care, oriented them to hospital/ room, and discussed what we are doing for her dad. Patient reoriented frequently, as he has alzheimers. Patient's daughter would like him to have flu shot while inpatient.   Problem: Safety: Goal: Ability to remain free from injury will improve Outcome: Progressing Bed alarm utilized and majority of night spent at patient's bedside keeping him calm and reorienting him to hospital.   Problem: Health Behavior/Discharge Planning: Goal: Ability to manage health-related needs will improve Outcome: Progressing Daughter is patient's POA and she is going to be involved in plans for his discharge.   Problem: Physical Regulation: Goal: Ability to maintain clinical measurements within normal limits will improve Outcome: Progressing Patient CBG unstable overnight. Required Dextrose x 4. Paged night coverage and given order for D10 along with D5NS- this combo helped, but still 56 on recheck. Patient temperature also declining. Core Temp 35 and Bair Hugger required again.  Goal: Will remain free from infection Outcome: Progressing Urine very thick and mucous like. Patient on antibiotics.   Problem: Skin Integrity: Goal: Risk for impaired skin integrity will decrease Outcome: Progressing Stage 2 present on bottom. Patient turned frequently and foam placed to prevent further breakdown.   Problem: Tissue Perfusion: Goal: Risk factors for ineffective tissue perfusion will decrease Outcome: Progressing Order for SCD';s. Patient not tolerating- causing him to become more agitated.

## 2016-03-13 NOTE — Progress Notes (Addendum)
[facility] PROGRESS NOTE  Verita LambHowell W Lassen UJW:[id number]:[id number] DOB: 09-01-1928 DOA: 03/12/2016   PCP: Mickie HillierIan McKeag, MD     Assessment/Plan: Active Problems:   Subdural hematoma (HCC)   Seizure (HCC)   Cough   Fall   83 y.o. male in by ambulance to the emergency department after he was found lying in the floor by his son. Duration on the floor unknown. Patient not responsive he was sedated with Ativan for possible seizure, history obtained from his son.  . Lately he's been battling with low blood sugars, to the point that he is Lantus has been modified as the sugars were "out all over the place". On EMS arrival blood sugar was found to be 35. Patient some states that patient has been eating and drinking regularly. Also report study of multiple UTIs.  ED Course: Upon ED arrival, patient was found to be obtunded with a left hand clonus/tremor. CT head was done showing acute on chronic subdural hemorrhage. EP consulted neurosurgery and neurology. Recommendations of loading with Keppra EEG were made. Patient was given Ativan in the ED. [facility].   Assessment and plan Subdural hematoma - acute on chronic subdural hematoma with no midline shift. Case discussed with neurosurgery and neurology , [facility]. Patient with clonus seen by EDP thought to be a seizure. Was loaded with Keppra and given Ativan. EEG was performed.  Status post EEG -abnormal suggestive of generalized cerebral dysfunction. Neurochecks every one to 2 hours. Repeat CT scan no significant interval change. Ativan when necessary if seizures Patient has a pacemaker unable to perform MRI to rule out CVA Family refused craniotomy  Hypoglycemia - Patient has remaining hypoglycemic despite of D5 solution and few D10, unclear source at this point questionable is this is part of sepsis given hypothermia and gross UTI.  Started on prophylactic antibiotic with  cefepime, continue D5NS with potassium Follow-up urine and blood culture  Monitor CBG q2 hrs , Check cortisol, TSH, free T4  UTI - mid criteria for sepsis in view of hypothermia, tachypnea and leukopenia with source of infection Continue antibiotics  Mechanical Fall - Secondary to hypoglycemia and natural deconditioning from his dementia.   Severe Dementia - patient will benefit from palliative care consult, patient will need goals of care patient  as been declining for the past year  Palliative care consult. Currently nothing by mouth  DM type II - now hypoglycemic  Hold insulin  See above   Hypokalemia-hypomagnesemia-replete aggressively    DVT prophylaxsis SCDs  Code Status:  DO NOT RESUSCITATE    Family Communication: Discussed in detail with the patient/son/daughter, all imaging results, lab results explained to the patient   Disposition Plan:  Palliative care consultation, goals of care     Consultants:  Neurology  Procedures:  None  Antibiotics: Anti-infectives    Start     Dose/Rate Route Frequency Ordered Stop   03/13/16 2100  ceFEPIme (MAXIPIME) 1 g in dextrose 5 % 50 mL IVPB  Status:  Discontinued     1 g 100 mL/hr over 30 Minutes Intravenous 2 times daily 03/12/16 1956 03/12/16 2041   03/12/16 2100  ceFEPIme (MAXIPIME) 1 g in dextrose 5 % 50 mL IVPB     1 g 100 mL/hr over 30 Minutes Intravenous 2 times daily 03/12/16 2041     03/12/16 1930  ceFEPIme (MAXIPIME) 1 g in dextrose 5 % 50 mL IVPB  Status:  Discontinued     1 g 100 mL/hr over 30 Minutes Intravenous 2 times daily 03/12/16 1917 03/12/16 1956   03/12/16 1415  cefTRIAXone (ROCEPHIN) 2 g in dextrose 5 % 50 mL IVPB     2 g 100 mL/hr over 30 Minutes Intravenous  Once 03/12/16 1414 03/12/16 1500         HPI/Subjective: Awake, confused, has a bear hugger in place  Objective: Vitals:   03/13/16 0500 03/13/16 0600 03/13/16 0700 03/13/16 0801  BP: (!) 106/56 103/72 (!) 97/56 100/66   Pulse:      Resp: (!) 22 (!) 25 19 20   Temp: (!) 95.2 F (35.1 C) (!) 95.5 F (35.3 C) (!) 96.3 F (35.7 C) (!) 96.8 F (36 C)  TempSrc:    Core (Comment)  SpO2:      Weight:      Height:        Intake/Output Summary (Last 24 hours) at 03/13/16 0816 Last data filed at 03/13/16 0700  Gross per 24 hour  Intake          4247.16 ml  Output              600 ml  Net          3647.16 ml    Exam:  Examination:  General exam: Appears calm and comfortable  Respiratory system: Clear to auscultation. Respiratory effort normal. Cardiovascular system: S1 & S2 heard, RRR. No JVD, murmurs, rubs, gallops or clicks. No pedal edema. Gastrointestinal system: Abdomen is nondistended, soft and nontender. No organomegaly or masses felt. Normal bowel sounds heard. Central nervous system:Awake but disoriented. No focal neurological deficits. Extremities: Symmetric 5 x 5 power. Skin: No rashes, lesions or ulcers Psychiatry: Judgement and insight appear normal. Mood & affect appropriate.     Data Reviewed: I have personally reviewed following labs and imaging studies  Micro Results Recent Results (from the past 240 hour(s))  Culture, blood (Routine x 2)     Status: None (Preliminary result)   Collection Time: 03/12/16 10:30 AM  Result Value Ref Range Status   Specimen Description BLOOD RIGHT HAND  Final   Special Requests IN PEDIATRIC BOTTLE  Final   Culture  Setup Time   Final    GRAM POSITIVE COCCI IN PEDIATRIC BOTTLE Organism ID to follow Performed at [facility], [address], [place], [place] [address]    Culture PENDING  Incomplete   Report Status PENDING  Incomplete  MRSA PCR Screening     Status: None   Collection Time: 03/12/16  8:37 PM  Result Value Ref Range Status   MRSA by PCR NEGATIVE NEGATIVE Final    Comment:        The GeneXpert MRSA Assay (FDA approved for NASAL specimens only), is one component of a comprehensive MRSA colonization surveillance  program. It is not intended to diagnose MRSA infection nor to guide or monitor treatment for MRSA infections.     Radiology Reports Ct Head Wo Contrast  Result Date: 03/13/2016 CLINICAL DATA:  Subdural hematoma EXAM: CT HEAD WITHOUT CONTRAST TECHNIQUE: Contiguous axial images were obtained from the base of the skull through the vertex without intravenous contrast. COMPARISON:  03/12/2016, 08/14/2015 FINDINGS: Brain: No large vessel territorial infarction or focal mass lesion is visualized. Again visualized is a mixed density left convexity subdural hematoma, this measures 1.7 cm in maximum thickness at the posterior frontal lobe, it is grossly unchanged compared with 03/12/2016 maximal measurement of  17 mm. Minimal all 1 mm midline shift to the right does not appear significantly changed. There is atrophy. Periventricular white matter hypodensity consistent with small vessel ischemic changes. The ventricles are stable in size. Vascular: No hyperdense vessels.  Carotid artery calcifications. Skull: No fracture.  Minimal fluid in the inferior mastoids. Sinuses/Orbits: No acute abnormality Other: Small posterior scalp swelling. IMPRESSION: 1. No significant interval change in the acute on chronic left hemispheric subdural hematoma compared with 03/12/2016, measuring 17 mm in maximum thickness compared with 17 mm on 03/12/2016. 2. Mild to moderate periventricular small vessel ischemic changes. Electronically Signed   By: Jasmine Pang M.D.   On: 03/13/2016 01:53   Ct Head Wo Contrast  Result Date: 03/12/2016 CLINICAL DATA:  Unwitnessed fall.  Found on floor. EXAM: CT HEAD WITHOUT CONTRAST CT CERVICAL SPINE WITHOUT CONTRAST TECHNIQUE: Multidetector CT imaging of the head and cervical spine was performed following the standard protocol without intravenous contrast. Multiplanar CT image reconstructions of the cervical spine were also generated. COMPARISON:  08/14/2015 FINDINGS: CT HEAD FINDINGS Brain: Mixed  density subdural fluid collection noted on the left measuring up to 17 mm in thickness compared to 10 mm previously, compatible with acute on chronic left subdural hematoma. No intraparenchymal hemorrhage or hydrocephalus. No acute infarction. Mild chronic microvascular disease throughout the deep white matter. No midline shift. Vascular: No hyperdense vessel or unexpected calcification. Skull: No acute calvarial abnormality. Sinuses/Orbits: Visualized paranasal sinuses and mastoids clear. Orbital soft tissues unremarkable. Other: None CT CERVICAL SPINE FINDINGS Alignment: Normal Skull base and vertebrae: No acute fracture. No primary bone lesion or focal pathologic process. Soft tissues and spinal canal: No prevertebral fluid or swelling. No visible canal hematoma. Disc levels: Degenerative disc disease changes at C5-6 and C6-7 with spurring and disc space narrowing. Upper chest: Negative Other: None IMPRESSION: Mixed density left subdural hematoma, enlarged since prior study compatible with acute on chronic subdural hematoma. No significant midline shift. Chronic microvascular disease. No acute findings in the cervical spine. Critical Value/emergent results were called by telephone at the time of interpretation on 03/12/2016 at 11:58 am to Dr. Mancel Bale , who verbally acknowledged these results. Electronically Signed   By: Charlett Nose M.D.   On: 03/12/2016 11:59   Ct Cervical Spine Wo Contrast  Result Date: 03/12/2016 CLINICAL DATA:  Unwitnessed fall.  Found on floor. EXAM: CT HEAD WITHOUT CONTRAST CT CERVICAL SPINE WITHOUT CONTRAST TECHNIQUE: Multidetector CT imaging of the head and cervical spine was performed following the standard protocol without intravenous contrast. Multiplanar CT image reconstructions of the cervical spine were also generated. COMPARISON:  08/14/2015 FINDINGS: CT HEAD FINDINGS Brain: Mixed density subdural fluid collection noted on the left measuring up to 17 mm in thickness compared  to 10 mm previously, compatible with acute on chronic left subdural hematoma. No intraparenchymal hemorrhage or hydrocephalus. No acute infarction. Mild chronic microvascular disease throughout the deep white matter. No midline shift. Vascular: No hyperdense vessel or unexpected calcification. Skull: No acute calvarial abnormality. Sinuses/Orbits: Visualized paranasal sinuses and mastoids clear. Orbital soft tissues unremarkable. Other: None CT CERVICAL SPINE FINDINGS Alignment: Normal Skull base and vertebrae: No acute fracture. No primary bone lesion or focal pathologic process. Soft tissues and spinal canal: No prevertebral fluid or swelling. No visible canal hematoma. Disc levels: Degenerative disc disease changes at C5-6 and C6-7 with spurring and disc space narrowing. Upper chest: Negative Other: None IMPRESSION: Mixed density left subdural hematoma, enlarged since prior study compatible with acute on chronic subdural  hematoma. No significant midline shift. Chronic microvascular disease. No acute findings in the cervical spine. Critical Value/emergent results were called by telephone at the time of interpretation on 03/12/2016 at 11:58 am to Dr. Mancel Bale , who verbally acknowledged these results. Electronically Signed   By: Charlett Nose M.D.   On: 03/12/2016 11:59   Dg Chest Port 1 View  Result Date: 03/12/2016 CLINICAL DATA:  Cough. EXAM: PORTABLE CHEST 1 VIEW COMPARISON:  Radiographs of August 16, 2015. FINDINGS: The heart size and mediastinal contours are within normal limits. Both lungs are clear. No pneumothorax or pleural effusion is noted. Left-sided pacemaker is unchanged in position. The visualized skeletal structures are unremarkable. IMPRESSION: No acute cardiopulmonary abnormality seen. Electronically Signed   By: Lupita Raider, M.D.   On: 03/12/2016 09:30     CBC  Recent Labs Lab 03/12/16 0840 03/12/16 1718  WBC 5.5 3.2*  HGB 11.0* 8.9*  HCT 31.7* 25.2*  PLT 100* 84*  MCV 85.9  85.4  MCH 29.8 30.2  MCHC 34.7 35.3  RDW 15.1 15.0  LYMPHSABS 0.7 0.6*  MONOABS 0.2 0.1  EOSABS 0.0 0.0  BASOSABS 0.0 0.0    Chemistries   Recent Labs Lab 03/12/16 0840 03/12/16 1718  NA 135 138  K 3.0* 2.9*  CL 106 111  CO2 22 20*  GLUCOSE 116* 56*  BUN 23* 23*  CREATININE 1.30* 1.10  CALCIUM 8.3* 7.6*  AST 36 24  ALT 16* 16*  ALKPHOS 104 83  BILITOT 0.5 0.4   ------------------------------------------------------------------------------------------------------------------ estimated creatinine clearance is 45 mL/min (by C-G formula based on SCr of 1.1 mg/dL). ------------------------------------------------------------------------------------------------------------------ No results for input(s): HGBA1C in the last 72 hours. ------------------------------------------------------------------------------------------------------------------ No results for input(s): CHOL, HDL, LDLCALC, TRIG, CHOLHDL, LDLDIRECT in the last 72 hours. ------------------------------------------------------------------------------------------------------------------ No results for input(s): TSH, T4TOTAL, T3FREE, THYROIDAB in the last 72 hours.  Invalid input(s): FREET3 ------------------------------------------------------------------------------------------------------------------ No results for input(s): VITAMINB12, FOLATE, FERRITIN, TIBC, IRON, RETICCTPCT in the last 72 hours.  Coagulation profile  Recent Labs Lab 03/12/16 0840  INR 1.01    No results for input(s): DDIMER in the last 72 hours.  Cardiac Enzymes No results for input(s): CKMB, TROPONINI, MYOGLOBIN in the last 168 hours.  Invalid input(s): CK ------------------------------------------------------------------------------------------------------------------ Invalid input(s): POCBNP   CBG:  Recent Labs Lab 03/13/16 0033 03/13/16 0128 03/13/16 0314 03/13/16 0516 03/13/16 0759  GLUCAP 24* 82 30* 56* 74        Studies: Ct Head Wo Contrast  Result Date: 03/13/2016 CLINICAL DATA:  Subdural hematoma EXAM: CT HEAD WITHOUT CONTRAST TECHNIQUE: Contiguous axial images were obtained from the base of the skull through the vertex without intravenous contrast. COMPARISON:  03/12/2016, 08/14/2015 FINDINGS: Brain: No large vessel territorial infarction or focal mass lesion is visualized. Again visualized is a mixed density left convexity subdural hematoma, this measures 1.7 cm in maximum thickness at the posterior frontal lobe, it is grossly unchanged compared with 03/12/2016 maximal measurement of 17 mm. Minimal all 1 mm midline shift to the right does not appear significantly changed. There is atrophy. Periventricular white matter hypodensity consistent with small vessel ischemic changes. The ventricles are stable in size. Vascular: No hyperdense vessels.  Carotid artery calcifications. Skull: No fracture.  Minimal fluid in the inferior mastoids. Sinuses/Orbits: No acute abnormality Other: Small posterior scalp swelling. IMPRESSION: 1. No significant interval change in the acute on chronic left hemispheric subdural hematoma compared with 03/12/2016, measuring 17 mm in maximum thickness compared with 17 mm on 03/12/2016. 2. Mild to  moderate periventricular small vessel ischemic changes. Electronically Signed   By: Jasmine Pang M.D.   On: 03/13/2016 01:53   Ct Head Wo Contrast  Result Date: 03/12/2016 CLINICAL DATA:  Unwitnessed fall.  Found on floor. EXAM: CT HEAD WITHOUT CONTRAST CT CERVICAL SPINE WITHOUT CONTRAST TECHNIQUE: Multidetector CT imaging of the head and cervical spine was performed following the standard protocol without intravenous contrast. Multiplanar CT image reconstructions of the cervical spine were also generated. COMPARISON:  08/14/2015 FINDINGS: CT HEAD FINDINGS Brain: Mixed density subdural fluid collection noted on the left measuring up to 17 mm in thickness compared to 10 mm previously,  compatible with acute on chronic left subdural hematoma. No intraparenchymal hemorrhage or hydrocephalus. No acute infarction. Mild chronic microvascular disease throughout the deep white matter. No midline shift. Vascular: No hyperdense vessel or unexpected calcification. Skull: No acute calvarial abnormality. Sinuses/Orbits: Visualized paranasal sinuses and mastoids clear. Orbital soft tissues unremarkable. Other: None CT CERVICAL SPINE FINDINGS Alignment: Normal Skull base and vertebrae: No acute fracture. No primary bone lesion or focal pathologic process. Soft tissues and spinal canal: No prevertebral fluid or swelling. No visible canal hematoma. Disc levels: Degenerative disc disease changes at C5-6 and C6-7 with spurring and disc space narrowing. Upper chest: Negative Other: None IMPRESSION: Mixed density left subdural hematoma, enlarged since prior study compatible with acute on chronic subdural hematoma. No significant midline shift. Chronic microvascular disease. No acute findings in the cervical spine. Critical Value/emergent results were called by telephone at the time of interpretation on 03/12/2016 at 11:58 am to Dr. Mancel Bale , who verbally acknowledged these results. Electronically Signed   By: Charlett Nose M.D.   On: 03/12/2016 11:59   Ct Cervical Spine Wo Contrast  Result Date: 03/12/2016 CLINICAL DATA:  Unwitnessed fall.  Found on floor. EXAM: CT HEAD WITHOUT CONTRAST CT CERVICAL SPINE WITHOUT CONTRAST TECHNIQUE: Multidetector CT imaging of the head and cervical spine was performed following the standard protocol without intravenous contrast. Multiplanar CT image reconstructions of the cervical spine were also generated. COMPARISON:  08/14/2015 FINDINGS: CT HEAD FINDINGS Brain: Mixed density subdural fluid collection noted on the left measuring up to 17 mm in thickness compared to 10 mm previously, compatible with acute on chronic left subdural hematoma. No intraparenchymal hemorrhage or  hydrocephalus. No acute infarction. Mild chronic microvascular disease throughout the deep white matter. No midline shift. Vascular: No hyperdense vessel or unexpected calcification. Skull: No acute calvarial abnormality. Sinuses/Orbits: Visualized paranasal sinuses and mastoids clear. Orbital soft tissues unremarkable. Other: None CT CERVICAL SPINE FINDINGS Alignment: Normal Skull base and vertebrae: No acute fracture. No primary bone lesion or focal pathologic process. Soft tissues and spinal canal: No prevertebral fluid or swelling. No visible canal hematoma. Disc levels: Degenerative disc disease changes at C5-6 and C6-7 with spurring and disc space narrowing. Upper chest: Negative Other: None IMPRESSION: Mixed density left subdural hematoma, enlarged since prior study compatible with acute on chronic subdural hematoma. No significant midline shift. Chronic microvascular disease. No acute findings in the cervical spine. Critical Value/emergent results were called by telephone at the time of interpretation on 03/12/2016 at 11:58 am to Dr. Mancel Bale , who verbally acknowledged these results. Electronically Signed   By: Charlett Nose M.D.   On: 03/12/2016 11:59   Dg Chest Port 1 View  Result Date: 03/12/2016 CLINICAL DATA:  Cough. EXAM: PORTABLE CHEST 1 VIEW COMPARISON:  Radiographs of August 16, 2015. FINDINGS: The heart size and mediastinal contours are within normal limits. Both  lungs are clear. No pneumothorax or pleural effusion is noted. Left-sided pacemaker is unchanged in position. The visualized skeletal structures are unremarkable. IMPRESSION: No acute cardiopulmonary abnormality seen. Electronically Signed   By: Lupita Raider, M.D.   On: 03/12/2016 09:30      Lab Results  Component Value Date   HGBA1C 8.0 (H) 11/01/2014   HGBA1C 8.5 06/13/2013   HGBA1C 10.9 02/19/2013   Lab Results  Component Value Date   CREATININE 1.10 03/12/2016       Scheduled Meds: . ceFEPime (MAXIPIME) IV  1  g Intravenous BID  . Influenza vac split quadrivalent PF  0.5 mL Intramuscular Tomorrow-1000  . insulin aspart  0-9 Units Subcutaneous TID WC  . levETIRAcetam  500 mg Intravenous Q12H  . mouth rinse  15 mL Mouth Rinse BID  . sodium chloride flush  3 mL Intravenous Q12H   Continuous Infusions: . dextrose 25 mL/hr at 03/13/16 0321  . dextrose 5 % and 0.9% NaCl 100 mL/hr at 03/13/16 0250     LOS: 1 day    Time spent: >30 MINS    [facility]  [facility] Pager [phone]. If 7PM-7AM, please contact night-coverage at [email or web address], password [email or web address] 03/13/2016, 8:16 AM  LOS: 1 day

## 2016-03-13 NOTE — Consult Note (Signed)
NEURO HOSPITALIST CONSULT NOTE   Requestig physician: Dr. Randel Pigg  Reason for Consult: Left subdural hematoma and possible seizure  History obtained from:   Chart    HPI:                                                                                                                                          Dakota White is an 83 y.o. male who was found on floor at home by family, incontinent of urine. Unknown time spent on floor. He has a diagnosis of dementia and is incontinent at baseline. CBG was 33 on scene. He was cold to the touch on scene and confirmed to be hypothermic on arrival to the [facility] ED with rectal temp of 84.5. A bear hugger warming device was placed.   Further history was obtained by ED staff as follows: The son noted when he found patient that he was more confused than his baseline and also had been having more hypoglycemic episodes than usual - his blood sugars are typically high. The son had recently modified patient's medications by stopping anxiety and BP meds as well as lowering his Lantus dosing. The patient also has a history of multiple UTIs.   CT was obtained in the ED revealing acute on chronic left subdural hematoma (17mm) without significant midline shift on CT scan. At 1328 he became less responsive and mild clonus of left arm was noted without rhythmic contractions - Ativan was ordered for possible seizure activity and the patient responded to this. The patient was loaded with Keppra. Also noted was RUE stiffness.    The patient was transferred to [facility] for Neurosurgery and Neurology consults, EEG, repeat CT head in 12 hours, q2h neuro checks, management of persisting hypoglycemia, evaluation for possible sepsis and Ativan PRN seizure.   Past Medical History:  Diagnosis Date  . Allergic rhinitis   . Anemia of chronic disease   . Asthma with bronchitis   . BPH (benign prostatic hyperplasia)   . Cardiac pacemaker in situ   . Chronic  combined systolic and diastolic CHF (congestive heart failure) (HCC)    a. EF noted to be down 10/2014 - 2D echo 10/17/14: EF 35-40%, mild LVH,  moderate HK of mid-apical anteroseptal and anterior myocardium, HK of inferior myocardium, grade 1 DD, mild MR.  . Complete heart block (HCC)    a. Requiring permanent [employer] DDD pacemaker by Dr. Ladona Ridgel in 2013.  Marland Kitchen Dementia   . Diabetes mellitus with renal complications (HCC)   . Essential hypertension   . General weakness   . Mental disorder   . Mixed hyperlipidemia   . Peripheral neuropathy (HCC)   . Peripheral vascular disease, unspecified   . Thrombocytopenia (HCC)   . Type II or  unspecified type diabetes mellitus without mention of complication, uncontrolled     Past Surgical History:  Procedure Laterality Date  . HIP ARTHROPLASTY Right 08/09/2015   Procedure: HIP HEMIARTHROPLASTY POSTERIOR;  Surgeon: Eldred Manges, MD;  Location: [facility] OR;  Service: Orthopedics;  Laterality: Right;  . PACEMAKER INSERTION  Dec 2013  . PERMANENT PACEMAKER INSERTION N/A 01/31/2012   Procedure: PERMANENT PACEMAKER INSERTION;  Surgeon: Marinus Maw, MD;  Location[facility] CATH LAB;  Service: Cardiovascular;  Laterality: N/A;  . ROTATOR CUFF REPAIR      Family History  Problem Relation Age of Onset  . Heart disease Mother   . Alzheimer's disease Father   . Diabetes Sister   . COPD Brother   . Alzheimer's disease Sister    Social History:  reports that he has quit smoking. His smoking use included Cigarettes. He has never used smokeless tobacco. He reports that he does not drink alcohol or use drugs.  Allergies  Allergen Reactions  . Actos [Pioglitazone] Other (See Comments)    "makes me feel lousy and makes me bleed"  . Ramipril Other (See Comments)    "pt not aware of allergy"    MEDICATIONS:                                                                                                                      Current Facility-Administered Medications:  .   ceFEPIme (MAXIPIME) 1 g in dextrose 5 % 50 mL IVPB, 1 g, Intravenous, BID, Danford Bad, RPH, 1 g at 03/12/16 2112 .  dextrose 5 %-0.9 % sodium chloride infusion, , Intravenous, Continuous, Lenox Ponds, MD, Last Rate: 125 mL/hr at 03/12/16 2350 .  Influenza vac split quadrivalent PF (FLUARIX) injection 0.5 mL, 0.5 mL, Intramuscular, Tomorrow-1000, Lenox Ponds, MD .  insulin aspart (novoLOG) injection 0-9 Units, 0-9 Units, Subcutaneous, TID WC, Lenox Ponds, MD, Stopped at 03/12/16 1719 .  LORazepam (ATIVAN) injection 2 mg, 2 mg, Intravenous, Q4H PRN, Lenox Ponds, MD .  sodium chloride flush (NS) 0.9 % injection 3 mL, 3 mL, Intravenous, Q12H, Lenox Ponds, MD, 3 mL at 03/12/16 2203   ROS:                                                                                                                                       The patient endorses mild headache, left shoulder  pain and right hip pain as well as bilateral foot pain. He is unable to provide a more detailed ROS due to AMS/dementia.  Blood pressure (!) 105/52, pulse 89, temperature 98.8 F (37.1 C), temperature source Axillary, resp. rate 19, weight 64.8 kg (142 lb 13.7 oz), SpO2 99 %.   General Examination:                                                                                                      HEENT-  Oral mucosa with decreased hydration.     Lungs- No gross wheezing. Respirations unlabored.  Extremities- No edema.   Neurological Examination Mental Status: Awake. Oriented to self, city, state, but not month or day of week. Increased latency of verbal and motor responses to commands. Speech dysarthric. Poverty of thought. Perseverates regarding left shoulder. Able to follow simple one-step commands. Able to name pen but not index finger or thumb. Limited speech output without errors of syntax or grammar. Cranial Nerves: II: Visual fields grossly normal to bedside confrontation testing.  Pupils equal in ambient light with right pupil sluggishly reactive relative to left. III,IV, VI: ptosis not present; eyes conjugate; able to track visually to left and right, with impaired upgaze. No nystagmus.  V,VII: smile symmetric, facial temperature sensation subjectively intact bilaterally VIII: hearing grossly intact to questions and commands IX,X: Hypophonic speech without hoarseness XI: deferred due to left shoulder pain XII: midline tongue extension Motor: Increased tone and decreased bulk x 4.  RUE 5/5 grip, biceps and triceps. Limited movement at shoulder.  LUE: 5/5 grip, 4/5 biceps and triceps. Severe pain at shoulder limits further testing.  RLE: Elevates at hip 4-/5; knee extension 4/5; ankle dorsi/plantar flexion 4+/5 LLE: Hip flexion 3/5; knee extension 4-/5; ankle dorsi/plantar flexion 4/5 Sensory: Light touch intact in proximal limbs x 4 without asymmetry.  Deep Tendon Reflexes: 2+ brachioradialis bilaterally. 0 achilles bilaterally; 1+ patellae. Unable to test Babinski due to pain.  Cerebellar: Bradykinesia with FNF bilaterally. Non-ataxic. No rest or action tremor noted.  Gait: Deferred.  Other: No seizure-like activity seen during Neurology consultant's evaluation.  Lab Results: Basic Metabolic Panel:  Recent Labs Lab 03/12/16 0840 03/12/16 1718  NA 135 138  K 3.0* 2.9*  CL 106 111  CO2 22 20*  GLUCOSE 116* 56*  BUN 23* 23*  CREATININE 1.30* 1.10  CALCIUM 8.3* 7.6*    Liver Function Tests:  Recent Labs Lab 03/12/16 0840 03/12/16 1718  AST 36 24  ALT 16* 16*  ALKPHOS 104 83  BILITOT 0.5 0.4  PROT 6.9 5.6*  ALBUMIN 3.2* 2.5*   No results for input(s): LIPASE, AMYLASE in the last 168 hours. No results for input(s): AMMONIA in the last 168 hours.  CBC:  Recent Labs Lab 03/12/16 0840 03/12/16 1718  WBC 5.5 3.2*  NEUTROABS 4.6 2.5  HGB 11.0* 8.9*  HCT 31.7* 25.2*  MCV 85.9 85.4  PLT 100* 84*    Cardiac Enzymes: No results for  input(s): CKTOTAL, CKMB, CKMBINDEX, TROPONINI in the last 168 hours.  Lipid Panel: No results for input(s): CHOL,  TRIG, HDL, CHOLHDL, VLDL, LDLCALC in the last 168 hours.  CBG:  Recent Labs Lab 03/12/16 1625 03/12/16 1718 03/12/16 1843 03/12/16 2152 03/12/16 2245  GLUCAP 73 52* 113* 25* 80    Microbiology: Results for orders placed or performed during the hospital encounter of 03/12/16  MRSA PCR Screening     Status: None   Collection Time: 03/12/16  8:37 PM  Result Value Ref Range Status   MRSA by PCR NEGATIVE NEGATIVE Final    Comment:        The GeneXpert MRSA Assay (FDA approved for NASAL specimens only), is one component of a comprehensive MRSA colonization surveillance program. It is not intended to diagnose MRSA infection nor to guide or monitor treatment for MRSA infections.     Coagulation Studies:  Recent Labs  03/12/16 0840  LABPROT 13.3  INR 1.01    Imaging: Ct Head Wo Contrast  Result Date: 03/12/2016 CLINICAL DATA:  Unwitnessed fall.  Found on floor. EXAM: CT HEAD WITHOUT CONTRAST CT CERVICAL SPINE WITHOUT CONTRAST TECHNIQUE: Multidetector CT imaging of the head and cervical spine was performed following the standard protocol without intravenous contrast. Multiplanar CT image reconstructions of the cervical spine were also generated. COMPARISON:  08/14/2015 FINDINGS: CT HEAD FINDINGS Brain: Mixed density subdural fluid collection noted on the left measuring up to 17 mm in thickness compared to 10 mm previously, compatible with acute on chronic left subdural hematoma. No intraparenchymal hemorrhage or hydrocephalus. No acute infarction. Mild chronic microvascular disease throughout the deep white matter. No midline shift. Vascular: No hyperdense vessel or unexpected calcification. Skull: No acute calvarial abnormality. Sinuses/Orbits: Visualized paranasal sinuses and mastoids clear. Orbital soft tissues unremarkable. Other: None CT CERVICAL SPINE FINDINGS  Alignment: Normal Skull base and vertebrae: No acute fracture. No primary bone lesion or focal pathologic process. Soft tissues and spinal canal: No prevertebral fluid or swelling. No visible canal hematoma. Disc levels: Degenerative disc disease changes at C5-6 and C6-7 with spurring and disc space narrowing. Upper chest: Negative Other: None IMPRESSION: Mixed density left subdural hematoma, enlarged since prior study compatible with acute on chronic subdural hematoma. No significant midline shift. Chronic microvascular disease. No acute findings in the cervical spine. Critical Value/emergent results were called by telephone at the time of interpretation on 03/12/2016 at 11:58 am to Dr. Mancel Bale , who verbally acknowledged these results. Electronically Signed   By: Charlett Nose M.D.   On: 03/12/2016 11:59   Ct Cervical Spine Wo Contrast  Result Date: 03/12/2016 CLINICAL DATA:  Unwitnessed fall.  Found on floor. EXAM: CT HEAD WITHOUT CONTRAST CT CERVICAL SPINE WITHOUT CONTRAST TECHNIQUE: Multidetector CT imaging of the head and cervical spine was performed following the standard protocol without intravenous contrast. Multiplanar CT image reconstructions of the cervical spine were also generated. COMPARISON:  08/14/2015 FINDINGS: CT HEAD FINDINGS Brain: Mixed density subdural fluid collection noted on the left measuring up to 17 mm in thickness compared to 10 mm previously, compatible with acute on chronic left subdural hematoma. No intraparenchymal hemorrhage or hydrocephalus. No acute infarction. Mild chronic microvascular disease throughout the deep white matter. No midline shift. Vascular: No hyperdense vessel or unexpected calcification. Skull: No acute calvarial abnormality. Sinuses/Orbits: Visualized paranasal sinuses and mastoids clear. Orbital soft tissues unremarkable. Other: None CT CERVICAL SPINE FINDINGS Alignment: Normal Skull base and vertebrae: No acute fracture. No primary bone lesion or focal  pathologic process. Soft tissues and spinal canal: No prevertebral fluid or swelling. No visible canal hematoma. Disc levels: Degenerative  disc disease changes at C5-6 and C6-7 with spurring and disc space narrowing. Upper chest: Negative Other: None IMPRESSION: Mixed density left subdural hematoma, enlarged since prior study compatible with acute on chronic subdural hematoma. No significant midline shift. Chronic microvascular disease. No acute findings in the cervical spine. Critical Value/emergent results were called by telephone at the time of interpretation on 03/12/2016 at 11:58 am to Dr. Mancel Bale , who verbally acknowledged these results. Electronically Signed   By: Charlett Nose M.D.   On: 03/12/2016 11:59   Dg Chest Port 1 View  Result Date: 03/12/2016 CLINICAL DATA:  Cough. EXAM: PORTABLE CHEST 1 VIEW COMPARISON:  Radiographs of August 16, 2015. FINDINGS: The heart size and mediastinal contours are within normal limits. Both lungs are clear. No pneumothorax or pleural effusion is noted. Left-sided pacemaker is unchanged in position. The visualized skeletal structures are unremarkable. IMPRESSION: No acute cardiopulmonary abnormality seen. Electronically Signed   By: Lupita Raider, M.D.   On: 03/12/2016 09:30    Assessment: 1. Status post fall at home. Fall thought to be most likely secondary to hypoglycemia in context of deconditioning/dementia. Chronic subdural hematoma may also have contributed.  2. Possible seizure. Of note, patient's son had recently stopped his anxiety meds, therefore fall secondary to benzodiazepine withdrawal seizure is on DDx. No seizure like activity seen during Neurology consultant's evaluation. 3. Acute on chronic left subdural hematoma seen on CT. Acute component likely secondary to fall. The hematoma is now larger than on prior CT head performed July 2017.  4. Severe dementia.  5. Cardiac pacemaker.  6. Elevated BUN/Cr and decreased mucosal hydration are  consistent with volume depletion.  7. History of CHF.   Recommendations: 1. Neurosurgery consult.  2. Repeat CT head in 12 hours. 3. Q2h neuro checks 4. Management of persisting hypoglycemia per primary team.  5. Management of UTI and evaluation for possible sepsis per primary team.  6. Ativan 2 mg IV PRN seizure.  7. Keppra 500 mg IV BID (ordered).  8. EEG completed with results pending. 9. Has pacemaker. Unable to perform MRI.   10. Gentle IV rehydration.  11. CIWA protocol due to possible benzodiazepine withdrawal (stopped by son at home per history).   Electronically signed: Dr. Caryl Pina 03/13/2016, 12:27 AM

## 2016-03-13 NOTE — Progress Notes (Signed)
\  PHARMACY - PHYSICIAN COMMUNICATION CRITICAL VALUE ALERT - BLOOD CULTURE IDENTIFICATION (BCID)  Results for orders placed or performed during the hospital encounter of 03/12/16  Blood Culture ID Panel (Reflexed) (Collected: 03/12/2016 10:30 AM)  Result Value Ref Range   Enterococcus species NOT DETECTED NOT DETECTED   Listeria monocytogenes NOT DETECTED NOT DETECTED   Staphylococcus species NOT DETECTED NOT DETECTED   Staphylococcus aureus NOT DETECTED NOT DETECTED   Streptococcus species NOT DETECTED NOT DETECTED   Streptococcus agalactiae NOT DETECTED NOT DETECTED   Streptococcus pneumoniae NOT DETECTED NOT DETECTED   Streptococcus pyogenes NOT DETECTED NOT DETECTED   Acinetobacter baumannii NOT DETECTED NOT DETECTED   Enterobacteriaceae species NOT DETECTED NOT DETECTED   Enterobacter cloacae complex NOT DETECTED NOT DETECTED   Escherichia coli NOT DETECTED NOT DETECTED   Klebsiella oxytoca NOT DETECTED NOT DETECTED   Klebsiella pneumoniae NOT DETECTED NOT DETECTED   Proteus species NOT DETECTED NOT DETECTED   Serratia marcescens NOT DETECTED NOT DETECTED   Haemophilus influenzae NOT DETECTED NOT DETECTED   Neisseria meningitidis NOT DETECTED NOT DETECTED   Pseudomonas aeruginosa NOT DETECTED NOT DETECTED   Candida albicans NOT DETECTED NOT DETECTED   Candida glabrata NOT DETECTED NOT DETECTED   Candida krusei NOT DETECTED NOT DETECTED   Candida parapsilosis NOT DETECTED NOT DETECTED   Candida tropicalis NOT DETECTED NOT DETECTED    Name of physician (or Provider) Contacted: Abrol  Changes to prescribed antibiotics required: No changes needed - BCID negative and blood culture grew 1/3 bottles, likely contaminant  Malaina Mortellaro, Drake LeachRachel Lynn 03/13/2016  8:54 AM

## 2016-03-13 NOTE — Consult Note (Signed)
Consultation Note Date: 03/13/2016   Patient Name: Dakota White  DOB: 1928/04/22  MRN: [id number]  Age / Sex: 83 y.o., male  PCP: Kathee Delton, MD Referring Physician: Richarda Overlie, MD  Reason for Consultation: Establishing goals of care  HPI/Patient Profile: 83 y.o. male admitted on 03/12/2016    Clinical Assessment and Goals of Care:  Dakota White is a 83 year old gentleman with a past medical history significant for dementia diagnosed in 06/26/2009, history of fall and hip surgery requiring repair in July 2017. Patient has underlying history of recurrent urinary tract infections, falls, history of chronic subdural brain bleeds. Patient lives at home with son and grandson will function as his 53 7 caregivers. Daughter Dakota White visits regularly from [place] and is actively involved in the patient's healthcare.  The patient reportedly has been more confused for the past couple of days, was found to have slid off his bed and was found on the floor. Patient was initially taken to [facility], now transferred to [facility]. Patient's acute active issues in this hospitalization are acute on chronic subdural bleed, possible benzodiazepine withdrawal related seizures, possible hypo-glycemia related seizures. Patient was found to have a core temperature of 84.82F and blood sugars of 35 at the time of his arrival to the emergency department. Also found to have a urine infection. Palliative care consult at 4 goals of care discussions.  Patient is a weak appearing elderly gentleman resting in bed. Daughter Dakota White present at the bedside. Brief life review performed. Patient used to work as an Airline pilot and was formally diagnosed with dementia year after his wife's death in 2009/06/26.  Patient has had gradual progressive decline over the course of the past year. Daughter states that the patient completed his advance  directives in 06/26/93 where he outlined that he would not want aggressive/heroic measures at end-of-life. Discussed with patient's daughter about dementia trajectory and patient's ongoing decline. Discussed about acute issues pertaining to this hospitalization. Also introduced hospice. See recommendations below. Thank you for the consult. We will continue to follow along.  HCPOA  Daughter Dakota White who is currently visiting from [place], [place]. Patient lives at home with son Dakota White and grandson.   SUMMARY OF RECOMMENDATIONS    Family meeting with healthcare power of attorney daughter Dakota White: Goals of care discussions undertaken. CODE STATUS remains DO NOT RESUSCITATE/DO NOT INTUBATE. Family has discussed and decided against craniotomy. They wish to avoid surgical procedures, aggressive measures. Continue scope of current hospitalization with IV fluids, IV antibiotics. Added low-dose IV morphine for pain control. Follow is also speech therapy evaluation.  Briefly introduced addition of hospice services as an extra layer of support in the home setting towards the end of this hospitalization. Follow disease trajectory for now.  Code Status/Advance Care Planning:  DNR    Symptom Management:    Add low-dose morphine IV when necessary. Follow results from speech therapy evaluation. If patient is able to take by mouth's, recommend scheduled Tylenol.  Palliative Prophylaxis:   Delirium  Protocol  Psycho-social/Spiritual:   Desire for further Chaplaincy support:no  Additional Recommendations: Education on Hospice  Prognosis:   < 6 months?  Discharge Planning: Home with Hospice?      Primary Diagnoses: Present on Admission: . Subdural hematoma (HCC)   I have reviewed the medical record, interviewed the patient and family, and examined the patient. The following aspects are pertinent.  Past Medical History:  Diagnosis Date  . Allergic rhinitis   . Anemia of chronic  disease   . Asthma with bronchitis   . BPH (benign prostatic hyperplasia)   . Cardiac pacemaker in situ   . Chronic combined systolic and diastolic CHF (congestive heart failure) (HCC)    a. EF noted to be down 10/2014 - 2D echo 10/17/14: EF 35-40%, mild LVH,  moderate HK of mid-apical anteroseptal and anterior myocardium, HK of inferior myocardium, grade 1 DD, mild MR.  . Complete heart block (HCC)    a. Requiring permanent St. Jude DDD pacemaker by Dr. Ladona Ridgelaylor in 2013.  Marland Kitchen. Dementia   . Diabetes mellitus with renal complications (HCC)   . Essential hypertension   . General weakness   . Mental disorder   . Mixed hyperlipidemia   . Peripheral neuropathy (HCC)   . Peripheral vascular disease, unspecified   . Thrombocytopenia (HCC)   . Type II or unspecified type diabetes mellitus without mention of complication, uncontrolled    Social History   Social History  . Marital status: Widowed    Spouse name: N/A  . Number of children: N/A  . Years of education: N/A   Occupational History  . Retired Cytogeneticistveteran    Social History Main Topics  . Smoking status: Former Smoker    Types: Cigarettes  . Smokeless tobacco: Never Used  . Alcohol use No  . Drug use: No  . Sexual activity: No   Other Topics Concern  . None   Social History Narrative   The patient is widowed   Family History  Problem Relation Age of Onset  . Heart disease Mother   . Alzheimer's disease Father   . Diabetes Sister   . COPD Brother   . Alzheimer's disease Sister    Scheduled Meds: . ceFEPime (MAXIPIME) IV  1 g Intravenous BID  . Influenza vac split quadrivalent PF  0.5 mL Intramuscular Tomorrow-1000  . insulin aspart  0-9 Units Subcutaneous TID WC  . levETIRAcetam  500 mg Intravenous Q12H  . magnesium sulfate 1 - 4 g bolus IVPB  2 g Intravenous Once  . mouth rinse  15 mL Mouth Rinse BID  . potassium chloride (KCL MULTIRUN) 30 mEq in 265 mL IVPB  30 mEq Intravenous Once  . sodium chloride flush  3 mL  Intravenous Q12H   Continuous Infusions: . dextrose 5 %-0.9% nacl with kcl 75 mL/hr at 03/13/16 0927   PRN Meds:.LORazepam, morphine injection Medications Prior to Admission:  Prior to Admission medications   Medication Sig Start Date End Date Taking? Authorizing Provider  carvedilol (COREG) 3.125 MG tablet TAKE 1 TABLET BY MOUTH TWICE DAILY 06/23/15  Yes Lyn RecordsHenry W Smith, MD  finasteride (PROSCAR) 5 MG tablet TAKE 1 TABLET BY MOUTH EVERY DAY   Yes Elenora GammaSamuel L Bradshaw, MD  insulin glargine (LANTUS) 100 UNIT/ML injection Inject 0.35 mLs (35 Units total) into the skin at bedtime. Patient taking differently: Inject 20-50 Units into the skin daily as needed (high blood pressure).  08/14/15  Yes Zannie CovePreetha Joseph, MD  ipratropium-albuterol (DUONEB) 0.5-2.5 (3) MG/3ML SOLN  Take 3 mLs by nebulization 3 (three) times daily as needed (Dyspnea/wheezing).    Yes Historical Provider, MD  [facility] DELICA LANCETS 33G MISC 1 Device by Does not apply route 4 (four) times daily as needed. 02/19/13  Yes Elenora Gamma, MD  acetaminophen (TYLENOL) 325 MG tablet Take 2 tablets (650 mg total) by mouth every 6 (six) hours as needed for mild pain (or Fever >/= 101). Patient not taking: Reported on 03/12/2016 08/14/15   Zannie Cove, MD  donepezil (ARICEPT) 10 MG tablet Take 1 tablet (10 mg total) by mouth at bedtime. Patient not taking: Reported on 03/12/2016 02/19/13   Elenora Gamma, MD  memantine (NAMENDA) 10 MG tablet TAKE 1 TABLET BY MOUTH EVERY NIGHT AT BEDTIME Patient not taking: Reported on 03/12/2016 06/23/15   Kathee Delton, MD  polyethylene glycol (MIRALAX / Ethelene Hal) packet Take 17 g by mouth daily as needed for mild constipation. Patient not taking: Reported on 03/12/2016 08/14/15   Zannie Cove, MD  sertraline (ZOLOFT) 100 MG tablet Take 1 tablet (100 mg total) by mouth at bedtime. Patient not taking: Reported on 03/12/2016 02/19/13   Elenora Gamma, MD   Allergies  Allergen Reactions  . Actos [Pioglitazone]  Other (See Comments)    "makes me feel lousy and makes me bleed"  . Ramipril Other (See Comments)    "pt not aware of allergy"   Review of Systems + for pain in both legs and feet, R worse than L   Physical Exam Weak elderly Gentleman resting in bed Confused Complaining of pain in both legs and feet right worse than left Skin tears bilaterally both upper and lower extremities S1-S2 Coarse breath sounds anterior lung fields No edema, muscle wasting  Vital Signs: BP 100/66 (BP Location: Right Arm)   Pulse 85   Temp (!) 96.8 F (36 C) (Core (Comment))   Resp 20   Ht 6' (1.829 m)   Wt 64.8 kg (142 lb 13.7 oz)   SpO2 99%   BMI 19.38 kg/m  Pain Assessment: No/denies pain       SpO2: SpO2: 99 % O2 Device:SpO2: 99 % O2 Flow Rate: .   IO: Intake/output summary:  Intake/Output Summary (Last 24 hours) at 03/13/16 0946 Last data filed at 03/13/16 0700  Gross per 24 hour  Intake          3747.16 ml  Output              600 ml  Net          3147.16 ml    LBM: Last BM Date: 03/13/16 Baseline Weight: Weight: 64.8 kg (142 lb 13.7 oz) Most recent weight: Weight: 64.8 kg (142 lb 13.7 oz)     Palliative Assessment/Data:   Flowsheet Rows   Flowsheet Row Most Recent Value  Intake Tab  Referral Department  Hospitalist  Unit at Time of Referral  Intermediate Care Unit  Palliative Care Primary Diagnosis  Sepsis/Infectious Disease  Palliative Care Type  New Palliative care  Reason for referral  Clarify Goals of Care  Date first seen by Palliative Care  03/13/16  Clinical Assessment  Palliative Performance Scale Score  30%  Pain Max last 24 hours  7  Pain Min Last 24 hours  5  Dyspnea Max Last 24 Hours  4  Dyspnea Min Last 24 hours  3  Nausea Max Last 24 Hours  4  Nausea Min Last 24 Hours  3  Anxiety Max Last 24 Hours  6  Anxiety Min Last 24 Hours  5  Psychosocial & Spiritual Assessment  Palliative Care Outcomes  Patient/Family meeting held?  Yes  Who was at the meeting?   daughter Dakota White   Palliative Care Outcomes  Clarified goals of care      Time In:  8.30 Time Out:  9.40 Time Total:  70 min  Greater than 50%  of this time was spent counseling and coordinating care related to the above assessment and plan.  Signed by: Rosalin Hawking, MD [phone]   Please contact Palliative Medicine Team phone at [phone] for questions and concerns.  For individual provider: See Loretha Stapler

## 2016-03-13 NOTE — Progress Notes (Signed)
Discussed with both son and daughter risks/benefits of craniotomy. At this time, they have decided against surgical intervention and just provide palliative care. Appreciative of Dr Beatriz StallionAnwar's consult.

## 2016-03-14 ENCOUNTER — Inpatient Hospital Stay (HOSPITAL_COMMUNITY): Payer: Medicare Other

## 2016-03-14 DIAGNOSIS — L8990 Pressure ulcer of unspecified site, unspecified stage: Secondary | ICD-10-CM | POA: Insufficient documentation

## 2016-03-14 DIAGNOSIS — T68XXXA Hypothermia, initial encounter: Secondary | ICD-10-CM

## 2016-03-14 LAB — CBC
HCT: 25.5 % — ABNORMAL LOW (ref 39.0–52.0)
HEMOGLOBIN: 8.5 g/dL — AB (ref 13.0–17.0)
MCH: 29.4 pg (ref 26.0–34.0)
MCHC: 33.3 g/dL (ref 30.0–36.0)
MCV: 88.2 fL (ref 78.0–100.0)
PLATELETS: 74 10*3/uL — AB (ref 150–400)
RBC: 2.89 MIL/uL — AB (ref 4.22–5.81)
RDW: 16.1 % — ABNORMAL HIGH (ref 11.5–15.5)
WBC: 2.9 10*3/uL — AB (ref 4.0–10.5)

## 2016-03-14 LAB — GLUCOSE, CAPILLARY
GLUCOSE-CAPILLARY: 76 mg/dL (ref 65–99)
GLUCOSE-CAPILLARY: 41 mg/dL — AB (ref 65–99)
GLUCOSE-CAPILLARY: 65 mg/dL (ref 65–99)
GLUCOSE-CAPILLARY: 157 mg/dL — AB (ref 65–99)
Glucose-Capillary: 42 mg/dL — CL (ref 65–99)
Glucose-Capillary: 43 mg/dL — CL (ref 65–99)
Glucose-Capillary: 74 mg/dL (ref 65–99)
Glucose-Capillary: 133 mg/dL — ABNORMAL HIGH (ref 65–99)
Glucose-Capillary: 191 mg/dL — ABNORMAL HIGH (ref 65–99)

## 2016-03-14 LAB — BASIC METABOLIC PANEL
ANION GAP: 6 (ref 5–15)
BUN: 13 mg/dL (ref 6–20)
CALCIUM: 7.5 mg/dL — AB (ref 8.9–10.3)
CO2: 16 mmol/L — AB (ref 22–32)
CREATININE: 1.25 mg/dL — AB (ref 0.61–1.24)
Chloride: 115 mmol/L — ABNORMAL HIGH (ref 101–111)
GFR calc Af Amer: 59 mL/min — ABNORMAL LOW (ref >60)
GFR, EST NON AFRICAN AMERICAN: 51 mL/min — AB (ref >60)
Glucose, Bld: 124 mg/dL — ABNORMAL HIGH (ref 65–99)
Potassium: 4.5 mmol/L (ref 3.5–5.1)
SODIUM: 137 mmol/L (ref 135–145)

## 2016-03-14 LAB — CULTURE, BLOOD (ROUTINE X 2)

## 2016-03-14 MED ORDER — VANCOMYCIN HCL IN DEXTROSE 1-5 GM/200ML-% IV SOLN
1000.0000 mg | INTRAVENOUS | Status: DC
  Administered 2016-03-14 – 2016-03-15 (×2): 1000 mg via INTRAVENOUS
  Filled 2016-03-14 (×2): qty 200

## 2016-03-14 MED ORDER — DEXTROSE 50 % IV SOLN
INTRAVENOUS | Status: AC
  Administered 2016-03-14: 50 mL
  Filled 2016-03-14: qty 50

## 2016-03-14 MED ORDER — DEXTROSE 10 % IV SOLN
INTRAVENOUS | Status: DC
  Administered 2016-03-14 – 2016-03-15 (×3): via INTRAVENOUS

## 2016-03-14 MED ORDER — HYDROCORTISONE NA SUCCINATE PF 100 MG IJ SOLR
100.0000 mg | Freq: Three times a day (TID) | INTRAMUSCULAR | Status: DC
  Administered 2016-03-14 – 2016-03-15 (×4): 100 mg via INTRAVENOUS
  Filled 2016-03-14 (×4): qty 2

## 2016-03-14 MED ORDER — LORAZEPAM 2 MG/ML IJ SOLN: 0.5000 mg | INTRAMUSCULAR | Status: DC | PRN

## 2016-03-14 NOTE — Plan of Care (Signed)
Problem: Health Behavior/Discharge Planning: Goal: Ability to manage health-related needs will improve Outcome: Progressing Patient once again hypoglycemic and hypothermic on night shift. Bair Hugger placed at beginning of shift and able to normalize temperature. Hypoglycemia repeating. Treated patient with dextrose x 4 again tonight. Every two hours patient requiring dextrose.

## 2016-03-14 NOTE — Progress Notes (Addendum)
No issues overnight. Daughter reports no change Still do not wish for craniotomy Daughter [place] like [place]  EXAM:  BP 138/64   Pulse 95   Temp 99.5 F (37.5 C)   Resp 18   Ht 6' (1.829 m)   Wt 64.8 kg (142 lb 13.7 oz)   SpO2 100%   BMI 19.38 kg/m   Drowsy Intermittently follows commands Noncommunicating  Impression  Dakota White with acute on chronic subdural hematoma Appears stable.  Plan  - Palliative care at request of son and daughter  Reviewed with above and agree.

## 2016-03-14 NOTE — Progress Notes (Signed)
Subjective:slightly more sleepy today but received morphine. Currently on Keppra 500 MG BID  Exam: Vitals:   03/14/16 0635 03/14/16 0700  BP: 135/68 138/64  Pulse: 96 95  Resp: 16 18  Temp: 99.5 F (37.5 C) 99.5 F (37.5 C)       Gen: In bed, NAD MS: Drowsy but withdraws from pain briskly CN:2-12 intact Motor: MAEW Sensory:intact to noxious stim   Pertinent Labs/Diagnostics: none    Impression: Left SDH on Keppra with no current seizure. Family currently looking into Palliative care. Neurology will S/O for now. Call with any questions.   Felicie MornDavid Mishika Flippen PA-C [facility] [phone]    03/14/2016, 10:17 AM

## 2016-03-14 NOTE — Evaluation (Signed)
Clinical/Bedside Swallow Evaluation Patient Details  Name: ROGAN WIGLEY MRN: [id number] Date of Birth: Jun 14, 1928  Today's Date: 03/14/2016 Time: SLP Start Time (ACUTE ONLY): 0750 SLP Stop Time (ACUTE ONLY): 0814 SLP Time Calculation (min) (ACUTE ONLY): 24 min  Past Medical History:  Past Medical History:  Diagnosis Date  . Allergic rhinitis   . Anemia of chronic disease   . Asthma with bronchitis   . BPH (benign prostatic hyperplasia)   . Cardiac pacemaker in situ   . Chronic combined systolic and diastolic CHF (congestive heart failure) (HCC)    a. EF noted to be down 10/2014 - 2D echo 10/17/14: EF 35-40%, mild LVH,  moderate HK of mid-apical anteroseptal and anterior myocardium, HK of inferior myocardium, grade 1 DD, mild MR.  . Complete heart block (HCC)    a. Requiring permanent St. Jude DDD pacemaker by Dr. Ladona Ridgel in 2013.  Marland Kitchen Dementia   . Diabetes mellitus with renal complications (HCC)   . Essential hypertension   . General weakness   . Mental disorder   . Mixed hyperlipidemia   . Peripheral neuropathy (HCC)   . Peripheral vascular disease, unspecified   . Thrombocytopenia (HCC)   . Type II or unspecified type diabetes mellitus without mention of complication, uncontrolled    Past Surgical History:  Past Surgical History:  Procedure Laterality Date  . HIP ARTHROPLASTY Right 08/09/2015   Procedure: HIP HEMIARTHROPLASTY POSTERIOR;  Surgeon: Eldred Manges, MD;  Location: [facility];  Service: Orthopedics;  Laterality: Right;  . PACEMAKER INSERTION  Dec 2013  . PERMANENT PACEMAKER INSERTION N/A 01/31/2012   Procedure: PERMANENT PACEMAKER INSERTION;  Surgeon: Marinus Maw, MD;  Location: [facility] CATH LAB;  Service: Cardiovascular;  Laterality: N/A;  . ROTATOR CUFF REPAIR     HPI:  Mr. gasparro is a 83 year old gentleman with a past medical history significant for dementia diagnosed in 2011, history of fall and hip surgery requiring repair in July 2017. Patient has underlying history of  recurrent urinary tract infections, falls, history of chronic subdural brain bleeds. Patient lives at home with son and grandson will function as his 60 7 caregivers. Patient's acute active issues in this hospitalization are acute on chronic subdural bleed, possible benzodiazepine withdrawal related seizures, possible hypo-glycemia related seizures. Patient was found to have a core temperature of 84.41F and blood sugars of 35 at the time of his arrival to the emergency department. Also found to have a urine infection.    Assessment / Plan / Recommendation Clinical Impression  Pts mentation is a barrier to safe PO intake. He is awake, but resists efforts to provide PO, does not orally manipulate small amounts of water dropped from a straw, or close lips on cup with hand over hand assist. Pt is not appropriate for PO intake. Await improved mentation for further trials.     Aspiration Risk  Severe aspiration risk;Risk for inadequate nutrition/hydration    Diet Recommendation NPO        Other  Recommendations Oral Care Recommendations: Oral care QID   Follow up Recommendations Skilled Nursing facility      Frequency and Duration min 2x/week  2 weeks       Prognosis        Swallow Study   General HPI: Mr. Cease is a 83 year old gentleman with a past medical history significant for dementia diagnosed in 2011, history of fall and hip surgery requiring repair in July 2017. Patient has underlying history of recurrent urinary tract infections, falls,  history of chronic subdural brain bleeds. Patient lives at home with son and grandson will function as his 6024 7 caregivers. Patient's acute active issues in this hospitalization are acute on chronic subdural bleed, possible benzodiazepine withdrawal related seizures, possible hypo-glycemia related seizures. Patient was found to have a core temperature of 84.35F and blood sugars of 35 at the time of his arrival to the emergency department. Also found  to have a urine infection.  Type of Study: Bedside Swallow Evaluation Previous Swallow Assessment: none in chart Diet Prior to this Study: NPO Temperature Spikes Noted: No Respiratory Status: Room air History of Recent Intubation: No Behavior/Cognition: Lethargic/Drowsy;Uncooperative;Confused Oral Cavity Assessment: Dry Oral Care Completed by SLP: Yes Oral Cavity - Dentition: Poor condition;Missing dentition Self-Feeding Abilities: Total assist Patient Positioning: Upright in bed Baseline Vocal Quality: Normal Volitional Cough: Cognitively unable to elicit Volitional Swallow: Unable to elicit    Oral/Motor/Sensory Function Overall Oral Motor/Sensory Function: Generalized oral weakness   Ice Chips Ice chips: Impaired Presentation: Spoon Oral Phase Impairments: Poor awareness of bolus   Thin Liquid Thin Liquid: Impaired Presentation: Straw;Cup Oral Phase Impairments: Poor awareness of bolus;Reduced labial seal Oral Phase Functional Implications: Oral holding    Nectar Thick Nectar Thick Liquid: Not tested   Honey Thick Honey Thick Liquid: Not tested   Puree Puree: Not tested   Solid   GO   Solid: Not tested       Harlon DittyBonnie Emilyann Banka, MA CCC-SLP [phone]  Claudine MoutonDeBlois, Jasean Ambrosia Caroline 03/14/2016,9:27 AM

## 2016-03-14 NOTE — Progress Notes (Signed)
Pharmacy Antibiotic Note  Verita LambHowell W Cookston is a 83 y.o. male admitted on 03/12/2016 with sepsis. Pharmacy has been consulted for vancomycin/cefepime dosing. Patient has a history of multiple UTIs, presenting with a UA consistent with an UTI. Blood cultures (1/4 bottles) showing GPC in pairs with no result on BCID. Although this is likely a contaminant, vancomycin will be added at this time to broaden coverage since patient was hypothermic and hypotensive yesterday. Patient is currentlyafebrile with wbc down to 2.9 and Scr up to 1.25 (CrCl ~35-40 ml/min). Urine culture is still pending, and will be extremely helpful in guiding therapy.  Plan: Vancomycin 1 gram IV every 24 hours.  Goal trough 15-20 mcg/mL.  Cefepime 1 gram every 12 hours. Monitor clinical improvement and renal function. Follow-up urine and blood culture results and de-escalate when available   Height: 6' (182.9 cm) Weight: 142 lb 13.7 oz (64.8 kg) IBW/kg (Calculated) : 77.6  Temp (24hrs), Avg:97.6 F (36.4 C), Min:96.1 F (35.6 C), Max:99.5 F (37.5 C)   Recent Labs Lab 03/12/16 0840 03/12/16 0842 03/12/16 1242 03/12/16 1718 03/14/16 0354  WBC 5.5  --   --  3.2* 2.9*  CREATININE 1.30*  --   --  1.10 1.25*  LATICACIDVEN  --  1.29 0.43*  --   --     Estimated Creatinine Clearance: 39.6 mL/min (by C-G formula based on SCr of 1.25 mg/dL (H)).    Allergies  Allergen Reactions  . Actos [Pioglitazone] Other (See Comments)    "makes me feel lousy and makes me bleed"  . Ramipril Other (See Comments)    "pt not aware of allergy"    Antimicrobials this admission: Cefepime 2/9 >>  Vanc 2/11 >>    Microbiology results: 2/9 BCx: 1/2 GPC in pairs (pediatric bottle), 2nd site ngtd 2/9 UCx: sent  2/9 MRSA pcr: negative  Thank you for allowing pharmacy to be a part of this patient's care.  Allie BossierApryl Anderson, PharmD PGY1 Pharmacy Resident MS3 [phone] 03/14/2016 9:23 AM

## 2016-03-14 NOTE — Progress Notes (Signed)
[facility] PROGRESS NOTE  Verita LambHowell W Weber ZOX:[id number] DOB: February 18, 1928 DOA: 03/12/2016   PCP: Mickie HillierIan McKeag, MD     Assessment/Plan: Active Problems:   Subdural hematoma (HCC)   Seizure (HCC)   Cough   Fall   83 y.o. male in by ambulance to the emergency department after he was found lying in the floor by his son. Duration on the floor unknown. Patient not responsive he was sedated with Ativan for possible seizure, history obtained from his son.  . Lately he's been battling with low blood sugars, to the point that he is Lantus has been modified as the sugars were "out all over the place". On EMS arrival blood sugar was found to be 35. Patient some states that patient has been eating and drinking regularly. Also report study of multiple UTIs.  ED Course: Upon ED arrival, patient was found to be obtunded with a left hand clonus/tremor. CT head was done showing acute on chronic subdural hemorrhage. EP consulted neurosurgery and neurology. Recommendations of loading with Keppra EEG were made. Patient was given Ativan in the ED. [facility].   Assessment and plan Subdural hematoma - acute on chronic subdural hematoma with no midline shift. Case discussed with neurosurgery and neurology , [facility]. Patient with clonus seen by EDP thought to be a seizure. Was loaded with Keppra and given Ativan. EEG was performed.  Status post EEG -abnormal suggestive of generalized cerebral dysfunction. Neurochecks every one to 2 hours. Repeat CT scan no significant interval change. Ativan when necessary if seizures Patient has a pacemaker unable to perform MRI to rule out CVA Family refused craniotomy, agreeable to repeat a CT scan as the patient seems really uncomfortable today and seems to have a headache   Hypoglycemia -family would like to continue treatment of ongoing medical issues  Patient has recurrent hypoglycemic  despite   D5 solution and  D10, unclear source at this point questionable is this is part of sepsis given hypothermia and gross UTI vs adrenal insufficiency in the setting of UTI  .  Started on prophylactic antibiotic with cefepime, will add vancomycin given ongoing sepsis physiology, discontinue D5 NSS and change over to D10 Continue to follow results of urine culture and blood culture Continue Accu-Cheks every 2 hours  cortisol level 12.3, started on stress dose steroids as well, suspect adrenal insufficiency   UTI /gram positive cocci in pairs  - mid criteria for sepsis in view of hypothermia, tachypnea and leukopenia with source of infection Continue antibiotics as above   Mechanical Fall - Secondary to hypoglycemia and natural deconditioning from his dementia.   Severe Dementia - appreciate palliative care consult,  Currently nothing by mouth, SLP eval pending  DM type II - now hypoglycemic  Hold insulin     Hypokalemia-hypomagnesemia-replete aggressively  Thrombocytopenia-patient has known history of thrombocytopenia-platelets are stable in the 70s.   DVT prophylaxsis SCDs  Code Status:  DO NOT RESUSCITATE    Family Communication: Discussed in detail with the patient/son/daughter, all imaging results, lab results explained to the patient   Disposition Plan:  Continues to have medical need given persistent hypoglycemia, continue stepdown Extensive discussion with the patient's daughter, she is concerned about being aggressive in terms of managing the patient subdural hematoma but would like to treat underlying medical issues. She would like SNF placement close to [place] in [place]    Consultants:  Neurology  Procedures:  None  Antibiotics: Anti-infectives    Start     Dose/Rate Route Frequency Ordered Stop   03/13/16 2100  ceFEPIme (MAXIPIME) 1 g in dextrose 5 % 50 mL IVPB  Status:  Discontinued     1 g 100 mL/hr over 30 Minutes Intravenous 2 times  daily 03/12/16 1956 03/12/16 2041   03/12/16 2100  ceFEPIme (MAXIPIME) 1 g in dextrose 5 % 50 mL IVPB     1 g 100 mL/hr over 30 Minutes Intravenous 2 times daily 03/12/16 2041     03/12/16 1930  ceFEPIme (MAXIPIME) 1 g in dextrose 5 % 50 mL IVPB  Status:  Discontinued     1 g 100 mL/hr over 30 Minutes Intravenous 2 times daily 03/12/16 1917 03/12/16 1956   03/12/16 1415  cefTRIAXone (ROCEPHIN) 2 g in dextrose 5 % 50 mL IVPB     2 g 100 mL/hr over 30 Minutes Intravenous  Once 03/12/16 1414 03/12/16 1500         HPI/Subjective: Patient is awake but very uncomfortable, hallucinating and mumbling  Objective: Vitals:   03/14/16 0400 03/14/16 0500 03/14/16 0635 03/14/16 0700  BP: 140/88 140/68 135/68 138/64  Pulse: 93 95 96 95  Resp: (!) 24 (!) 25 16 18   Temp:   99.5 F (37.5 C) 99.5 F (37.5 C)  TempSrc:      SpO2: 100% 100% 100% 100%  Weight:      Height:        Intake/Output Summary (Last 24 hours) at 03/14/16 0846 Last data filed at 03/14/16 0700  Gross per 24 hour  Intake          3124.25 ml  Output              450 ml  Net          2674.25 ml    Exam:  Examination:  General exam: Appears calm and comfortable  Respiratory system: Clear to auscultation. Respiratory effort normal. Cardiovascular system: S1 & S2 heard, RRR. No JVD, murmurs, rubs, gallops or clicks. No pedal edema. Gastrointestinal system: Abdomen is nondistended, soft and nontender. No organomegaly or masses felt. Normal bowel sounds heard. Central nervous system:Awake but disoriented. No focal neurological deficits. Extremities: Symmetric 5 x 5 power. Skin: No rashes, lesions or ulcers Psychiatry: Judgement and insight Obtunded. Mood & affect appropriate.     Data Reviewed: I have personally reviewed following labs and imaging studies  Micro Results Recent Results (from the past 240 hour(s))  Culture, blood (Routine x 2)     Status: None (Preliminary result)   Collection Time: 03/12/16  8:30  AM  Result Value Ref Range Status   Specimen Description BLOOD RIGHT ANTECUBITAL  Final   Special Requests BOTTLES DRAWN AEROBIC AND ANAEROBIC  Final   Culture   Final    NO GROWTH < 24 HOURS Performed at [facility], [address] [place], [place] [address]    Report Status PENDING  Incomplete  Culture, blood (Routine x 2)     Status: None (Preliminary result)   Collection Time: 03/12/16 10:30 AM  Result Value Ref Range Status   Specimen Description BLOOD RIGHT HAND  Final   Special Requests IN PEDIATRIC BOTTLE  Final   Culture  Setup Time   Final    GRAM POSITIVE COCCI IN PAIRS IN PEDIATRIC BOTTLE CRITICAL RESULT CALLED TO, READ BACK BY AND VERIFIED WITH: R RUMBARGER,PHARMD AT 0850 03/13/16 BY L BENFIELD Performed  at [facility], [address], [place], [place] [address]    Culture GRAM POSITIVE COCCI  Final   Report Status PENDING  Incomplete  Blood Culture ID Panel (Reflexed)     Status: None   Collection Time: 03/12/16 10:30 AM  Result Value Ref Range Status   Enterococcus species NOT DETECTED NOT DETECTED Final   Listeria monocytogenes NOT DETECTED NOT DETECTED Final   Staphylococcus species NOT DETECTED NOT DETECTED Final   Staphylococcus aureus NOT DETECTED NOT DETECTED Final   Streptococcus species NOT DETECTED NOT DETECTED Final   Streptococcus agalactiae NOT DETECTED NOT DETECTED Final   Streptococcus pneumoniae NOT DETECTED NOT DETECTED Final   Streptococcus pyogenes NOT DETECTED NOT DETECTED Final   Acinetobacter baumannii NOT DETECTED NOT DETECTED Final   Enterobacteriaceae species NOT DETECTED NOT DETECTED Final   Enterobacter cloacae complex NOT DETECTED NOT DETECTED Final   Escherichia coli NOT DETECTED NOT DETECTED Final   Klebsiella oxytoca NOT DETECTED NOT DETECTED Final   Klebsiella pneumoniae NOT DETECTED NOT DETECTED Final   Proteus species NOT DETECTED NOT DETECTED Final   Serratia marcescens NOT DETECTED NOT DETECTED Final    Haemophilus influenzae NOT DETECTED NOT DETECTED Final   Neisseria meningitidis NOT DETECTED NOT DETECTED Final   Pseudomonas aeruginosa NOT DETECTED NOT DETECTED Final   Candida albicans NOT DETECTED NOT DETECTED Final   Candida glabrata NOT DETECTED NOT DETECTED Final   Candida krusei NOT DETECTED NOT DETECTED Final   Candida parapsilosis NOT DETECTED NOT DETECTED Final   Candida tropicalis NOT DETECTED NOT DETECTED Final    Comment: Performed at [facility], [address] [place], [place] [address]  MRSA PCR Screening     Status: None   Collection Time: 03/12/16  8:37 PM  Result Value Ref Range Status   MRSA by PCR NEGATIVE NEGATIVE Final    Comment:        The GeneXpert MRSA Assay (FDA approved for NASAL specimens only), is one component of a comprehensive MRSA colonization surveillance program. It is not intended to diagnose MRSA infection nor to guide or monitor treatment for MRSA infections.     Radiology Reports Ct Head Wo Contrast  Result Date: 03/13/2016 CLINICAL DATA:  Subdural hematoma EXAM: CT HEAD WITHOUT CONTRAST TECHNIQUE: Contiguous axial images were obtained from the base of the skull through the vertex without intravenous contrast. COMPARISON:  03/12/2016, 08/14/2015 FINDINGS: Brain: No large vessel territorial infarction or focal mass lesion is visualized. Again visualized is a mixed density left convexity subdural hematoma, this measures 1.7 cm in maximum thickness at the posterior frontal lobe, it is grossly unchanged compared with 03/12/2016 maximal measurement of 17 mm. Minimal all 1 mm midline shift to the right does not appear significantly changed. There is atrophy. Periventricular white matter hypodensity consistent with small vessel ischemic changes. The ventricles are stable in size. Vascular: No hyperdense vessels.  Carotid artery calcifications. Skull: No fracture.  Minimal fluid in the inferior mastoids. Sinuses/Orbits: No acute abnormality  Other: Small posterior scalp swelling. IMPRESSION: 1. No significant interval change in the acute on chronic left hemispheric subdural hematoma compared with 03/12/2016, measuring 17 mm in maximum thickness compared with 17 mm on 03/12/2016. 2. Mild to moderate periventricular small vessel ischemic changes. Electronically Signed   By: Jasmine Pang M.D.   On: 03/13/2016 01:53   Ct Head Wo Contrast  Result Date: 03/12/2016 CLINICAL DATA:  Unwitnessed fall.  Found on floor. EXAM: CT HEAD WITHOUT CONTRAST CT  CERVICAL SPINE WITHOUT CONTRAST TECHNIQUE: Multidetector CT imaging of the head and cervical spine was performed following the standard protocol without intravenous contrast. Multiplanar CT image reconstructions of the cervical spine were also generated. COMPARISON:  08/14/2015 FINDINGS: CT HEAD FINDINGS Brain: Mixed density subdural fluid collection noted on the left measuring up to 17 mm in thickness compared to 10 mm previously, compatible with acute on chronic left subdural hematoma. No intraparenchymal hemorrhage or hydrocephalus. No acute infarction. Mild chronic microvascular disease throughout the deep white matter. No midline shift. Vascular: No hyperdense vessel or unexpected calcification. Skull: No acute calvarial abnormality. Sinuses/Orbits: Visualized paranasal sinuses and mastoids clear. Orbital soft tissues unremarkable. Other: None CT CERVICAL SPINE FINDINGS Alignment: Normal Skull base and vertebrae: No acute fracture. No primary bone lesion or focal pathologic process. Soft tissues and spinal canal: No prevertebral fluid or swelling. No visible canal hematoma. Disc levels: Degenerative disc disease changes at C5-6 and C6-7 with spurring and disc space narrowing. Upper chest: Negative Other: None IMPRESSION: Mixed density left subdural hematoma, enlarged since prior study compatible with acute on chronic subdural hematoma. No significant midline shift. Chronic microvascular disease. No acute  findings in the cervical spine. Critical Value/emergent results were called by telephone at the time of interpretation on 03/12/2016 at 11:58 am to Dr. Mancel Bale , who verbally acknowledged these results. Electronically Signed   By: Charlett Nose M.D.   On: 03/12/2016 11:59   Ct Cervical Spine Wo Contrast  Result Date: 03/12/2016 CLINICAL DATA:  Unwitnessed fall.  Found on floor. EXAM: CT HEAD WITHOUT CONTRAST CT CERVICAL SPINE WITHOUT CONTRAST TECHNIQUE: Multidetector CT imaging of the head and cervical spine was performed following the standard protocol without intravenous contrast. Multiplanar CT image reconstructions of the cervical spine were also generated. COMPARISON:  08/14/2015 FINDINGS: CT HEAD FINDINGS Brain: Mixed density subdural fluid collection noted on the left measuring up to 17 mm in thickness compared to 10 mm previously, compatible with acute on chronic left subdural hematoma. No intraparenchymal hemorrhage or hydrocephalus. No acute infarction. Mild chronic microvascular disease throughout the deep white matter. No midline shift. Vascular: No hyperdense vessel or unexpected calcification. Skull: No acute calvarial abnormality. Sinuses/Orbits: Visualized paranasal sinuses and mastoids clear. Orbital soft tissues unremarkable. Other: None CT CERVICAL SPINE FINDINGS Alignment: Normal Skull base and vertebrae: No acute fracture. No primary bone lesion or focal pathologic process. Soft tissues and spinal canal: No prevertebral fluid or swelling. No visible canal hematoma. Disc levels: Degenerative disc disease changes at C5-6 and C6-7 with spurring and disc space narrowing. Upper chest: Negative Other: None IMPRESSION: Mixed density left subdural hematoma, enlarged since prior study compatible with acute on chronic subdural hematoma. No significant midline shift. Chronic microvascular disease. No acute findings in the cervical spine. Critical Value/emergent results were called by telephone at the  time of interpretation on 03/12/2016 at 11:58 am to Dr. Mancel Bale , who verbally acknowledged these results. Electronically Signed   By: Charlett Nose M.D.   On: 03/12/2016 11:59   Dg Chest Port 1 View  Result Date: 03/12/2016 CLINICAL DATA:  Cough. EXAM: PORTABLE CHEST 1 VIEW COMPARISON:  Radiographs of August 16, 2015. FINDINGS: The heart size and mediastinal contours are within normal limits. Both lungs are clear. No pneumothorax or pleural effusion is noted. Left-sided pacemaker is unchanged in position. The visualized skeletal structures are unremarkable. IMPRESSION: No acute cardiopulmonary abnormality seen. Electronically Signed   By: Lupita Raider, M.D.   On: 03/12/2016 09:30  CBC  Recent Labs Lab 03/12/16 0840 03/12/16 1718 03/14/16 0354  WBC 5.5 3.2* 2.9*  HGB 11.0* 8.9* 8.5*  HCT 31.7* 25.2* 25.5*  PLT 100* 84* 74*  MCV 85.9 85.4 88.2  MCH 29.8 30.2 29.4  MCHC 34.7 35.3 33.3  RDW 15.1 15.0 16.1*  LYMPHSABS 0.7 0.6*  --   MONOABS 0.2 0.1  --   EOSABS 0.0 0.0  --   BASOSABS 0.0 0.0  --     Chemistries   Recent Labs Lab 03/12/16 0840 03/12/16 1718 03/13/16 0916 03/14/16 0354  NA 135 138  --  137  K 3.0* 2.9*  --  4.5  CL 106 111  --  115*  CO2 22 20*  --  16*  GLUCOSE 116* 56*  --  124*  BUN 23* 23*  --  13  CREATININE 1.30* 1.10  --  1.25*  CALCIUM 8.3* 7.6*  --  7.5*  MG  --   --  1.4*  --   AST 36 24  --   --   ALT 16* 16*  --   --   ALKPHOS 104 83  --   --   BILITOT 0.5 0.4  --   --    ------------------------------------------------------------------------------------------------------------------ estimated creatinine clearance is 39.6 mL/min (by C-G formula based on SCr of 1.25 mg/dL (H)). ------------------------------------------------------------------------------------------------------------------ No results for input(s): HGBA1C in the last 72  hours. ------------------------------------------------------------------------------------------------------------------ No results for input(s): CHOL, HDL, LDLCALC, TRIG, CHOLHDL, LDLDIRECT in the last 72 hours. ------------------------------------------------------------------------------------------------------------------  Recent Labs  03/13/16 0916  TSH 5.128*   ------------------------------------------------------------------------------------------------------------------ No results for input(s): VITAMINB12, FOLATE, FERRITIN, TIBC, IRON, RETICCTPCT in the last 72 hours.  Coagulation profile  Recent Labs Lab 03/12/16 0840  INR 1.01    No results for input(s): DDIMER in the last 72 hours.  Cardiac Enzymes No results for input(s): CKMB, TROPONINI, MYOGLOBIN in the last 168 hours.  Invalid input(s): CK ------------------------------------------------------------------------------------------------------------------ Invalid input(s): POCBNP   CBG:  Recent Labs Lab 03/13/16 2329 03/14/16 0130 03/14/16 0330 03/14/16 0534 03/14/16 0836  GLUCAP 61* 76 41* 42* 43*       Studies: Ct Head Wo Contrast  Result Date: 03/13/2016 CLINICAL DATA:  Subdural hematoma EXAM: CT HEAD WITHOUT CONTRAST TECHNIQUE: Contiguous axial images were obtained from the base of the skull through the vertex without intravenous contrast. COMPARISON:  03/12/2016, 08/14/2015 FINDINGS: Brain: No large vessel territorial infarction or focal mass lesion is visualized. Again visualized is a mixed density left convexity subdural hematoma, this measures 1.7 cm in maximum thickness at the posterior frontal lobe, it is grossly unchanged compared with 03/12/2016 maximal measurement of 17 mm. Minimal all 1 mm midline shift to the right does not appear significantly changed. There is atrophy. Periventricular white matter hypodensity consistent with small vessel ischemic changes. The ventricles are stable in  size. Vascular: No hyperdense vessels.  Carotid artery calcifications. Skull: No fracture.  Minimal fluid in the inferior mastoids. Sinuses/Orbits: No acute abnormality Other: Small posterior scalp swelling. IMPRESSION: 1. No significant interval change in the acute on chronic left hemispheric subdural hematoma compared with 03/12/2016, measuring 17 mm in maximum thickness compared with 17 mm on 03/12/2016. 2. Mild to moderate periventricular small vessel ischemic changes. Electronically Signed   By: Jasmine Pang M.D.   On: 03/13/2016 01:53   Ct Head Wo Contrast  Result Date: 03/12/2016 CLINICAL DATA:  Unwitnessed fall.  Found on floor. EXAM: CT HEAD WITHOUT CONTRAST CT CERVICAL SPINE WITHOUT CONTRAST TECHNIQUE: Multidetector CT imaging  of the head and cervical spine was performed following the standard protocol without intravenous contrast. Multiplanar CT image reconstructions of the cervical spine were also generated. COMPARISON:  08/14/2015 FINDINGS: CT HEAD FINDINGS Brain: Mixed density subdural fluid collection noted on the left measuring up to 17 mm in thickness compared to 10 mm previously, compatible with acute on chronic left subdural hematoma. No intraparenchymal hemorrhage or hydrocephalus. No acute infarction. Mild chronic microvascular disease throughout the deep white matter. No midline shift. Vascular: No hyperdense vessel or unexpected calcification. Skull: No acute calvarial abnormality. Sinuses/Orbits: Visualized paranasal sinuses and mastoids clear. Orbital soft tissues unremarkable. Other: None CT CERVICAL SPINE FINDINGS Alignment: Normal Skull base and vertebrae: No acute fracture. No primary bone lesion or focal pathologic process. Soft tissues and spinal canal: No prevertebral fluid or swelling. No visible canal hematoma. Disc levels: Degenerative disc disease changes at C5-6 and C6-7 with spurring and disc space narrowing. Upper chest: Negative Other: None IMPRESSION: Mixed density left  subdural hematoma, enlarged since prior study compatible with acute on chronic subdural hematoma. No significant midline shift. Chronic microvascular disease. No acute findings in the cervical spine. Critical Value/emergent results were called by telephone at the time of interpretation on 03/12/2016 at 11:58 am to Dr. Mancel Bale , who verbally acknowledged these results. Electronically Signed   By: Charlett Nose M.D.   On: 03/12/2016 11:59   Ct Cervical Spine Wo Contrast  Result Date: 03/12/2016 CLINICAL DATA:  Unwitnessed fall.  Found on floor. EXAM: CT HEAD WITHOUT CONTRAST CT CERVICAL SPINE WITHOUT CONTRAST TECHNIQUE: Multidetector CT imaging of the head and cervical spine was performed following the standard protocol without intravenous contrast. Multiplanar CT image reconstructions of the cervical spine were also generated. COMPARISON:  08/14/2015 FINDINGS: CT HEAD FINDINGS Brain: Mixed density subdural fluid collection noted on the left measuring up to 17 mm in thickness compared to 10 mm previously, compatible with acute on chronic left subdural hematoma. No intraparenchymal hemorrhage or hydrocephalus. No acute infarction. Mild chronic microvascular disease throughout the deep white matter. No midline shift. Vascular: No hyperdense vessel or unexpected calcification. Skull: No acute calvarial abnormality. Sinuses/Orbits: Visualized paranasal sinuses and mastoids clear. Orbital soft tissues unremarkable. Other: None CT CERVICAL SPINE FINDINGS Alignment: Normal Skull base and vertebrae: No acute fracture. No primary bone lesion or focal pathologic process. Soft tissues and spinal canal: No prevertebral fluid or swelling. No visible canal hematoma. Disc levels: Degenerative disc disease changes at C5-6 and C6-7 with spurring and disc space narrowing. Upper chest: Negative Other: None IMPRESSION: Mixed density left subdural hematoma, enlarged since prior study compatible with acute on chronic subdural  hematoma. No significant midline shift. Chronic microvascular disease. No acute findings in the cervical spine. Critical Value/emergent results were called by telephone at the time of interpretation on 03/12/2016 at 11:58 am to Dr. Mancel Bale , who verbally acknowledged these results. Electronically Signed   By: Charlett Nose M.D.   On: 03/12/2016 11:59   Dg Chest Port 1 View  Result Date: 03/12/2016 CLINICAL DATA:  Cough. EXAM: PORTABLE CHEST 1 VIEW COMPARISON:  Radiographs of August 16, 2015. FINDINGS: The heart size and mediastinal contours are within normal limits. Both lungs are clear. No pneumothorax or pleural effusion is noted. Left-sided pacemaker is unchanged in position. The visualized skeletal structures are unremarkable. IMPRESSION: No acute cardiopulmonary abnormality seen. Electronically Signed   By: Lupita Raider, M.D.   On: 03/12/2016 09:30      Lab Results  Component Value  Date   HGBA1C 8.0 (H) 11/01/2014   HGBA1C 8.5 06/13/2013   HGBA1C 10.9 02/19/2013   Lab Results  Component Value Date   CREATININE 1.25 (H) 03/14/2016       Scheduled Meds: . ceFEPime (MAXIPIME) IV  1 g Intravenous BID  . hydrocortisone sod succinate (SOLU-CORTEF) inj  100 mg Intravenous Q8H  . Influenza vac split quadrivalent PF  0.5 mL Intramuscular Tomorrow-1000  . insulin aspart  0-9 Units Subcutaneous TID WC  . levETIRAcetam  500 mg Intravenous Q12H  . mouth rinse  15 mL Mouth Rinse BID  . sodium chloride flush  3 mL Intravenous Q12H   Continuous Infusions: . dextrose 5 %-0.9% nacl with kcl 125 mL/hr at 03/14/16 0532     LOS: 2 days    Time spent: >30 MINS    [facility]  [facility] Pager [phone]. If 7PM-7AM, please contact night-coverage at [email or web address], password [facility] 03/14/2016, 8:46 AM  LOS: 2 days

## 2016-03-15 ENCOUNTER — Telehealth: Payer: Self-pay | Admitting: Interventional Cardiology

## 2016-03-15 ENCOUNTER — Telehealth: Payer: Self-pay | Admitting: Family Medicine

## 2016-03-15 LAB — COMPREHENSIVE METABOLIC PANEL
ALT: 17 U/L (ref 17–63)
ANION GAP: 8 (ref 5–15)
AST: 22 U/L (ref 15–41)
Albumin: 2.3 g/dL — ABNORMAL LOW (ref 3.5–5.0)
Alkaline Phosphatase: 84 U/L (ref 38–126)
BILIRUBIN TOTAL: 0.9 mg/dL (ref 0.3–1.2)
BUN: 12 mg/dL (ref 6–20)
CHLORIDE: 107 mmol/L (ref 101–111)
CO2: 17 mmol/L — ABNORMAL LOW (ref 22–32)
Calcium: 8.2 mg/dL — ABNORMAL LOW (ref 8.9–10.3)
Creatinine, Ser: 1.35 mg/dL — ABNORMAL HIGH (ref 0.61–1.24)
GFR calc Af Amer: 54 mL/min — ABNORMAL LOW (ref >60)
GFR, EST NON AFRICAN AMERICAN: 46 mL/min — AB (ref >60)
Glucose, Bld: 262 mg/dL — ABNORMAL HIGH (ref 65–99)
POTASSIUM: 4.5 mmol/L (ref 3.5–5.1)
Sodium: 132 mmol/L — ABNORMAL LOW (ref 135–145)
TOTAL PROTEIN: 5.6 g/dL — AB (ref 6.5–8.1)

## 2016-03-15 LAB — GLUCOSE, CAPILLARY
GLUCOSE-CAPILLARY: 164 mg/dL — AB (ref 65–99)
GLUCOSE-CAPILLARY: 216 mg/dL — AB (ref 65–99)
GLUCOSE-CAPILLARY: 237 mg/dL — AB (ref 65–99)
GLUCOSE-CAPILLARY: 289 mg/dL — AB (ref 65–99)
GLUCOSE-CAPILLARY: 313 mg/dL — AB (ref 65–99)
Glucose-Capillary: 173 mg/dL — ABNORMAL HIGH (ref 65–99)
Glucose-Capillary: 216 mg/dL — ABNORMAL HIGH (ref 65–99)

## 2016-03-15 LAB — CBC
HCT: 26.2 % — ABNORMAL LOW (ref 39.0–52.0)
Hemoglobin: 8.9 g/dL — ABNORMAL LOW (ref 13.0–17.0)
MCH: 29.7 pg (ref 26.0–34.0)
MCHC: 34 g/dL (ref 30.0–36.0)
MCV: 87.3 fL (ref 78.0–100.0)
PLATELETS: 78 10*3/uL — AB (ref 150–400)
RBC: 3 MIL/uL — ABNORMAL LOW (ref 4.22–5.81)
RDW: 15.4 % (ref 11.5–15.5)
WBC: 3.8 10*3/uL — AB (ref 4.0–10.5)

## 2016-03-15 LAB — URINE CULTURE: Culture: >=100000 — AB

## 2016-03-15 LAB — MAGNESIUM: Magnesium: 1.5 mg/dL — ABNORMAL LOW (ref 1.7–2.4)

## 2016-03-15 MED ORDER — HYDROCORTISONE NA SUCCINATE PF 100 MG IJ SOLR
25.0000 mg | Freq: Three times a day (TID) | INTRAMUSCULAR | Status: DC
  Administered 2016-03-15 – 2016-03-17 (×6): 25 mg via INTRAVENOUS
  Filled 2016-03-15 (×6): qty 2

## 2016-03-15 MED ORDER — SODIUM CHLORIDE 0.9 % IV SOLN
1.0000 g | Freq: Two times a day (BID) | INTRAVENOUS | Status: DC
  Administered 2016-03-15 – 2016-03-20 (×11): 1 g via INTRAVENOUS
  Filled 2016-03-15 (×13): qty 1

## 2016-03-15 MED ORDER — MAGNESIUM SULFATE 2 GM/50ML IV SOLN
2.0000 g | Freq: Once | INTRAVENOUS | Status: AC
  Administered 2016-03-15: 2 g via INTRAVENOUS
  Filled 2016-03-15: qty 50

## 2016-03-15 MED ORDER — SODIUM CHLORIDE 0.9 % IV SOLN
INTRAVENOUS | Status: DC
  Administered 2016-03-15 – 2016-03-20 (×6): via INTRAVENOUS

## 2016-03-15 MED ORDER — INSULIN ASPART 100 UNIT/ML ~~LOC~~ SOLN: 0.0000 [IU] | SUBCUTANEOUS | Status: DC

## 2016-03-15 NOTE — Telephone Encounter (Signed)
I have reviewed the patient's chart. There is no clear cardiac problem involved on this admission. I do agree with palliative care as recommended by the other physicians given his other comorbidities.

## 2016-03-15 NOTE — Telephone Encounter (Signed)
Will route to Dr. Smith to make him aware. 

## 2016-03-15 NOTE — Progress Notes (Signed)
Speech Language Pathology Treatment: Dysphagia  Patient Details Name: Dakota White W Frankland MRN: [id number] DOB: 04-08-1928 Today's Date: 03/15/2016 Time: [id number] SLP Time Calculation (min) (ACUTE ONLY): 15 min  Assessment / Plan / Recommendation Clinical Impression  Pt more responsive to PO today (no oral care given prior to trials to reduce pt agitation). Pt repositioned, but remained partially reclined and side-lying. Visual, verbal nd tactile cues improved pts awareness and oral acceptance of PO with adequate tolerance of purees. Max assistance needed with thin liquids, positioning made sipping from a cup difficult, and pt could not achieve adequate suction with along straw. Delayed swallow and cough x1 observed, but otherwise pt did not show further signs of aspiration. Suggested a lidded sippy cup to facilitate intake.    Pt will be at risk of aspiration given mentation, but daughter agreed to attempt comfort feeding with pt. She did not seem to understand what that meant completely, as she said "we would do a feeding tube if he really needed it." SLP reiterated that [name] can eat and drink, but he could also choke on what he is eating or drinking. Daughter verbalized understanding but wanted to proceed with diet and accept risk. Will f/u tomorrow for tolerance of diet and assist with feeding as needed.    HPI HPI: [name] is a 83 year old gentleman with a past medical history significant for dementia diagnosed in 2011, history of fall and hip surgery requiring repair in July 2017. Patient has underlying history of recurrent urinary tract infections, falls, history of chronic subdural brain bleeds. Patient lives at home with son and grandson will function as his [phone] caregivers. Patient's acute active issues in this hospitalization are acute on chronic subdural bleed, possible benzodiazepine withdrawal related seizures, possible hypo-glycemia related seizures. Patient was found to have a core  temperature of 84.21F and blood sugars of 35 at the time of his arrival to the emergency department. Also found to have a urine infection.       SLP Plan  Continue with current plan of care     Recommendations  Diet recommendations: Dysphagia 1 (puree);Thin liquid Liquids provided via: Cup;Straw Medication Administration: Crushed with puree Supervision: Full supervision/cueing for compensatory strategies;Trained caregiver to feed patient Compensations: Slow rate;Small sips/bites Postural Changes and/or Swallow Maneuvers: Seated upright 90 degrees                Oral Care Recommendations: Oral care BID Follow up Recommendations: Skilled Nursing facility Plan: Continue with current plan of care       GO               [facility] Medical CenterBonnie Zachery Niswander, MA CCC-SLP [phone]  Claudine MoutonDeBlois, Mikailah Morel Caroline 03/15/2016, 3:38 PM

## 2016-03-15 NOTE — Progress Notes (Signed)
Pharmacy Antibiotic Note  Dakota White is a 83 y.o. male admitted on 03/12/2016 with ESBL Kleb Pneumo UTI.  Pharmacy has been consulted for meropenem dosing.  Did have subdural hematoma with what was thought to to be a seizure. Currently on Keppra. Afebrile, WBC 3.8.  Plan: Stop cefepime and vancomycin Start meropenem 1g IV Q12  Monitor clinical picture, renal function F/U C&S, abx deescalation / LOT   Height: 6' (182.9 cm) Weight: 142 lb 13.7 oz (64.8 kg) IBW/kg (Calculated) : 77.6  Temp (24hrs), Avg:98.8 F (37.1 C), Min:98.1 F (36.7 C), Max:99.1 F (37.3 C)   Recent Labs Lab 03/12/16 0840 03/12/16 0842 03/12/16 1242 03/12/16 1718 03/14/16 0354 03/15/16 0223  WBC 5.5  --   --  3.2* 2.9* 3.8*  CREATININE 1.30*  --   --  1.10 1.25* 1.35*  LATICACIDVEN  --  1.29 0.43*  --   --   --     Estimated Creatinine Clearance: 36.7 mL/min (by C-G formula based on SCr of 1.35 mg/dL (H)).    Allergies  Allergen Reactions  . Actos [Pioglitazone] Other (See Comments)    "makes me feel lousy and makes me bleed"  . Ramipril Other (See Comments)    "pt not aware of allergy"    Antimicrobials this admission: Cefepime 2/9>>2/12 Vanc 2/11 >> 2/12 Meropenem 2/12 >>  Dose adjustments this admission: n/a  Microbiology results: 2/9BCx: 1/2 aerococcus viridans 2/9UCx: ESBL Kleb Pneumo (S to meropenem and Bactrim) 2/9 MRSA pcr: negative  Thank you for allowing pharmacy to be a part of this patient's care.  Enzo BiNathan Velva Molinari, PharmD, BCPS Clinical Pharmacist Pager [phone] 03/15/2016 10:11 AM

## 2016-03-15 NOTE — Evaluation (Signed)
Occupational Therapy Evaluation Patient Details Name: Dakota White MRN: [id number] DOB: Aug 20, 1928 Today's Date: 03/15/2016    History of Present Illness 87-yo male with a pmh significant for dementia, history of fall with hipsx in 2017. Patient has underlying history of recurrent urinary tract infections, falls, history of chronic subdural brain bleeds. [facility] with new onset confusion, continued weakness and unwitnessed fall.   Clinical Impression   Pt was assisted for ADL and IADL prior to admission and had 24 hour supervision. He presents with R hip pain, generalized weakness and impaired cognition. Pt with unreliable yes/no. He is dependent in all ADL and requires extensive 2 person assist for mobility. Will follow acutely. Recommending SNF upon d/c.    Follow Up Recommendations  SNF;Supervision/Assistance - 24 hour    Equipment Recommendations       Recommendations for Other Services       Precautions / Restrictions Precautions Precautions: Fall Restrictions Weight Bearing Restrictions: No      Mobility Bed Mobility Overal bed mobility: Needs Assistance Bed Mobility: Rolling;Sidelying to Sit;Sit to Sidelying Rolling: Max assist Sidelying to sit: Total assist;+2 for physical assistance     Sit to sidelying: +2 for physical assistance;Total assist General bed mobility comments: Pt with  preference for laying on R side due to hip pain, needs assist for all aspects of bed mobility  Transfers                 General transfer comment: unable to perform, limited session to EOB    Balance Overall balance assessment: Needs assistance;History of Falls Sitting-balance support: Bilateral upper extremity supported;Feet supported Sitting balance-Leahy Scale: Poor Sitting balance - Comments: mod to max assist to maintain midline, heavy lean to L side Postural control: Left lateral lean                                  ADL Overall ADL's :  Needs assistance/impaired                                       General ADL Comments: Pt is NPO, requires total assist for all ADL. Washed hair with shampoo cap and combed while seated EOB.     Vision Additional Comments: difficult to assess due to impaired cognition, grossly intact   Perception     Praxis      Pertinent Vitals/Pain Pain Assessment: Faces Faces Pain Scale: Hurts even more Pain Location: R hip Pain Descriptors / Indicators: Discomfort;Grimacing;Guarding;Moaning Pain Intervention(s): Repositioned;Monitored during session     Hand Dominance Right   Extremity/Trunk Assessment Upper Extremity Assessment Upper Extremity Assessment: Generalized weakness   Lower Extremity Assessment Lower Extremity Assessment: Defer to PT evaluation   Cervical / Trunk Assessment Cervical / Trunk Assessment: Kyphotic   Communication Communication Communication: HOH   Cognition Arousal/Alertness: Awake/alert Behavior During Therapy: Flat affect Overall Cognitive Status: History of cognitive impairments - at baseline                     General Comments       Exercises       Shoulder Instructions      Home Living Family/patient expects to be discharged to:: Private residence Living Arrangements: Children Available Help at Discharge: Personal care attendant Type of Home: House Home Access: Stairs to enter  Home Layout: One level               Home Equipment: Walker - 2 wheels;Bedside commode;Wheelchair - manual   Additional Comments: has caregiver [phone], son and grandson stay at night.      Prior Functioning/Environment Level of Independence: Needs assistance        Comments: family reports significant R Hip pain at baseline        OT Problem List: Decreased strength;Decreased activity tolerance;Impaired balance (sitting and/or standing);Pain;Impaired UE functional use   OT Treatment/Interventions: Self-care/ADL  training;DME and/or AE instruction;Therapeutic activities;Patient/family education;Balance training;Therapeutic exercise    OT Goals(Current goals can be found in the care plan section) Acute Rehab OT Goals Patient Stated Goal: none stated OT Goal Formulation: With family Potential to Achieve Goals: Fair ADL Goals Pt Will Perform Grooming: with mod assist;sitting Pt/caregiver will Perform Home Exercise Program: Increased strength;Both right and left upper extremity;With minimal assist (AROM ) Additional ADL Goal #1: Pt will perform bed mobility with +2 min assist in preparation for ADL at EOB. Additional ADL Goal #2: Pt will follow one step commands 50% of trials.  OT Frequency: Min 2X/week   Barriers to D/C:            Co-evaluation PT/OT/SLP Co-Evaluation/Treatment: Yes Reason for Co-Treatment: For patient/therapist safety   OT goals addressed during session: ADL's and self-care      End of Session    Activity Tolerance: Patient tolerated treatment well Patient left: in bed;with call bell/phone within reach;with bed alarm set;with family/visitor present   Time: 1610-9604 OT Time Calculation (min): 28 min Charges:  OT General Charges $OT Visit: 1 Procedure OT Evaluation $OT Eval Moderate Complexity: 1 Procedure G-Codes:    Evern Bio 03/15/2016, 9:31 AM [phone]

## 2016-03-15 NOTE — Telephone Encounter (Signed)
Spoke with daughter and she states that she recently found out that her brother took the pt off of most his medications.  He has only been off of Carvedilol x 2 weeks, which was stopped abruptly.  Daughter just wanted to check with Dr. Katrinka BlazingSmith and see if there are any issues that can be caused by stopping Coreg suddenly, instead of weaning off?  Also, [place] if they are able to get this arranged to go into SNF.  She wanted to know if Dr. Katrinka BlazingSmith knew any cardiologist in that area that he would recommend?

## 2016-03-15 NOTE — Telephone Encounter (Signed)
D[facility] and she states he was needing to have his a1c checked and would like to know if it was possible [facility] and [facility] and anything else pt might need. Please advise. Thanks! ep

## 2016-03-15 NOTE — Plan of Care (Signed)
Problem: Physical Regulation: Goal: Ability to maintain clinical measurements within normal limits will improve Outcome: Progressing Patient remained normothermic and had no episodes of hypoglycemia on PM shift. Patient's level of orientation and alertness has declining. Patient stares into space and when touched/ moved he yells and swings.   Problem: Skin Integrity: Goal: Risk for impaired skin integrity will decrease Outcome: Progressing Patient turned as frequently as he will allow. At times he yells and it is difficult to do. Patient has Prevalon boots and a sacral dressing to prevent breakdown.

## 2016-03-15 NOTE — Progress Notes (Signed)
[facility] PROGRESS NOTE  Dakota White ZOX:[id number] DOB: 1928-02-26 DOA: 03/12/2016   PCP: Mickie Hillier, MD     Assessment/Plan: Active Problems:   Subdural hematoma (HCC)   Seizure (HCC)   Cough   Fall   Hypothermia   Pressure injury of skin   83 y.o. male in by ambulance to the emergency department after he was found lying in the floor by his son. Duration on the floor unknown. Patient not responsive he was sedated with Ativan for possible seizure, history obtained from his son.  . Lately he's been battling with low blood sugars, to the point that he is Lantus has been modified as the sugars were "out all over the place". On EMS arrival blood sugar was found to be 35.   Upon ED arrival, patient was found to be obtunded with a left hand clonus/tremor. CT head was done showing acute on chronic subdural hemorrhage. EP consulted neurosurgery and neurology. Recommendations of loading with Keppra EEG were made. Patient was given Ativan in the ED. Patient will be transferred to [facility] for neurologic evaluation.  Started on stress dose steroids for persistent hypoglycemia for 48 hours Currently NPO, will likely need SNF with palliative care VS hospitalists   Assessment and plan Subdural hematoma - acute on chronic subdural hematoma with no midline shift. Case discussed with neurosurgery and neurology , transferred to [facility]. Patient with clonus seen by EDP thought to be a seizure. Was loaded with Keppra and given Ativan. EEG was performed.  Status post EEG -abnormal suggestive of generalized cerebral dysfunction. Neurochecks every one to 2 hours. Repeat CT scan shows slight increase since admission CT scan in size of subdural  Ativan when necessary if seizures Patient has a pacemaker unable to perform MRI to rule out CVA Family refused craniotomy, agreeable to repeat a CT scan as the patient seems really uncomfortable today and seems to have a headache    Hypoglycemia  -family would like to continue treatment of ongoing medical issues  Patient has recurrent hypoglycemic despite   unclear source at this point questionable is this is part of sepsis given hypothermia and gross UTI vs adrenal insufficiency in the setting of UTI  .  Started on prophylactic antibiotic with cefepime, will add vancomycin given ongoing sepsis physiology, discontinue D5 NSS and change over to D10, Now discontinued due to improvement in CBG with Solu-Cortef discontinue D10  Continue Accu-Cheks every 4 hours  cortisol level 12.3, started on stress dose steroids as well, suspect adrenal insufficiency Now steroids are tapered to 25 mg IV every 8, consider stopping Solu-Cortef in the next 1-2 days and monitoring Accu-Cheks    Klebsiella pneumoniae UTI  /gram positive cocci in pairs  - mid criteria for sepsis in view of hypothermia, tachypnea and leukopenia with source of infection Change cefepime to imipenem Discontinue vancomycin, one out of 2 bottles shows AEROCOCCUS VIRIDANS , likely contaminant BC ID negative   Mechanical Fall - Secondary to hypoglycemia and natural deconditioning from his dementia.   Severe Dementia - appreciate palliative care consult,  Currently nothing by mouth,as per speech therapy   DM type II - now hypoglycemic  Hold insulin     Hypokalemia-hypomagnesemia-replete aggressively  Thrombocytopenia-patient has known history of thrombocytopenia-platelets are stable in the 70s.   DVT prophylaxsis SCDs  Code Status:  DO NOT RESUSCITATE    Family Communication: Discussed in detail with the patient/son/daughter, all imaging results, lab results explained to the  patient   Disposition Plan:  Continues to have medical need given persistent hypoglycemia, continue stepdown Extensive discussion with the patient's daughter, she is concerned about being aggressive in terms of managing the patient subdural hematoma but would like to treat underlying medical  issues. She would like SNF placement close to her home in [place]    Consultants:  Neurology  Procedures:  None  Antibiotics: Anti-infectives    Start     Dose/Rate Route Frequency Ordered Stop   03/14/16 1000  vancomycin (VANCOCIN) IVPB 1000 mg/200 mL premix     1,000 mg 200 mL/hr over 60 Minutes Intravenous Every 24 hours 03/14/16 0930     03/13/16 2100  ceFEPIme (MAXIPIME) 1 g in dextrose 5 % 50 mL IVPB  Status:  Discontinued     1 g 100 mL/hr over 30 Minutes Intravenous 2 times daily 03/12/16 1956 03/12/16 2041   03/12/16 2100  ceFEPIme (MAXIPIME) 1 g in dextrose 5 % 50 mL IVPB     1 g 100 mL/hr over 30 Minutes Intravenous 2 times daily 03/12/16 2041     03/12/16 1930  ceFEPIme (MAXIPIME) 1 g in dextrose 5 % 50 mL IVPB  Status:  Discontinued     1 g 100 mL/hr over 30 Minutes Intravenous 2 times daily 03/12/16 1917 03/12/16 1956   03/12/16 1415  cefTRIAXone (ROCEPHIN) 2 g in dextrose 5 % 50 mL IVPB     2 g 100 mL/hr over 30 Minutes Intravenous  Once 03/12/16 1414 03/12/16 1500         HPI/Subjective: Patient much more comfortable today than he was yesterday, he is calm, denies pain  Objective: Vitals:   03/15/16 0400 03/15/16 0500 03/15/16 0600 03/15/16 0825  BP: (!) 138/109 135/89 (!) 141/80 (!) 154/70  Pulse: 95 94 94 94  Resp: 16 (!) 21 15 19   Temp: 99.1 F (37.3 C) 98.8 F (37.1 C) 98.4 F (36.9 C) 98.1 F (36.7 C)  TempSrc:    Oral  SpO2: 99% 99% 100% 98%  Weight:      Height:        Intake/Output Summary (Last 24 hours) at 03/15/16 1000 Last data filed at 03/15/16 0700  Gross per 24 hour  Intake             1933 ml  Output             2950 ml  Net            -1017 ml    Exam:  Examination:  General exam: Appears calm and comfortable  Respiratory system: Clear to auscultation. Respiratory effort normal. Cardiovascular system: S1 & S2 heard, RRR. No JVD, murmurs, rubs, gallops or clicks. No pedal edema. Gastrointestinal system:  Abdomen is nondistended, soft and nontender. No organomegaly or masses felt. Normal bowel sounds heard. Central nervous system:Awake but disoriented. No focal neurological deficits. Extremities: Symmetric 5 x 5 power. Skin: No rashes, lesions or ulcers Psychiatry: Judgement and insight Obtunded. Mood & affect appropriate.     Data Reviewed: I have personally reviewed following labs and imaging studies  Micro Results Recent Results (from the past 240 hour(s))  Culture, blood (Routine x 2)     Status: None (Preliminary result)   Collection Time: 03/12/16  8:30 AM  Result Value Ref Range Status   Specimen Description BLOOD RIGHT ANTECUBITAL  Final   Special Requests BOTTLES DRAWN AEROBIC AND ANAEROBIC  Final   Culture   Final  NO GROWTH 2 DAYS Performed at [facility], [address], [place], [place] [address]    Report Status PENDING  Incomplete  Culture, blood (Routine x 2)     Status: Abnormal   Collection Time: 03/12/16 10:30 AM  Result Value Ref Range Status   Specimen Description BLOOD RIGHT HAND  Final   Special Requests IN PEDIATRIC BOTTLE  Final   Culture  Setup Time   Final    GRAM POSITIVE COCCI IN PAIRS IN PEDIATRIC BOTTLE CRITICAL RESULT CALLED TO, READ BACK BY AND VERIFIED WITH: R RUMBARGER,PHARMD AT 0850 03/13/16 BY L BENFIELD    Culture (A)  Final    AEROCOCCUS VIRIDANS Standardized susceptibility testing for this organism is not available. Performed at [facility], [address] [place], [place] [address]    Report Status 03/14/2016 FINAL  Final  Blood Culture ID Panel (Reflexed)     Status: None   Collection Time: 03/12/16 10:30 AM  Result Value Ref Range Status   Enterococcus species NOT DETECTED NOT DETECTED Final   Listeria monocytogenes NOT DETECTED NOT DETECTED Final   Staphylococcus species NOT DETECTED NOT DETECTED Final   Staphylococcus aureus NOT DETECTED NOT DETECTED Final   Streptococcus species NOT DETECTED NOT DETECTED  Final   Streptococcus agalactiae NOT DETECTED NOT DETECTED Final   Streptococcus pneumoniae NOT DETECTED NOT DETECTED Final   Streptococcus pyogenes NOT DETECTED NOT DETECTED Final   Acinetobacter baumannii NOT DETECTED NOT DETECTED Final   Enterobacteriaceae species NOT DETECTED NOT DETECTED Final   Enterobacter cloacae complex NOT DETECTED NOT DETECTED Final   Escherichia coli NOT DETECTED NOT DETECTED Final   Klebsiella oxytoca NOT DETECTED NOT DETECTED Final   Klebsiella pneumoniae NOT DETECTED NOT DETECTED Final   Proteus species NOT DETECTED NOT DETECTED Final   Serratia marcescens NOT DETECTED NOT DETECTED Final   Haemophilus influenzae NOT DETECTED NOT DETECTED Final   Neisseria meningitidis NOT DETECTED NOT DETECTED Final   Pseudomonas aeruginosa NOT DETECTED NOT DETECTED Final   Candida albicans NOT DETECTED NOT DETECTED Final   Candida glabrata NOT DETECTED NOT DETECTED Final   Candida krusei NOT DETECTED NOT DETECTED Final   Candida parapsilosis NOT DETECTED NOT DETECTED Final   Candida tropicalis NOT DETECTED NOT DETECTED Final    Comment: Performed at [facility], [address], [place], [place] [address]  Urine culture     Status: Abnormal   Collection Time: 03/12/16  1:00 PM  Result Value Ref Range Status   Specimen Description URINE, CATHETERIZED  Final   Special Requests NONE  Final   Culture (A)  Final    >=100,000 COLONIES/mL KLEBSIELLA PNEUMONIAE Confirmed Extended Spectrum Beta-Lactamase Producer (ESBL) Performed at [facility], [address] [place], [place] [address]    Report Status 03/15/2016 FINAL  Final   Organism ID, Bacteria KLEBSIELLA PNEUMONIAE (A)  Final      Susceptibility   Klebsiella pneumoniae - MIC*    AMPICILLIN >=32 RESISTANT Resistant     CEFAZOLIN >=64 RESISTANT Resistant     CEFTRIAXONE >=64 RESISTANT Resistant     CIPROFLOXACIN >=4 RESISTANT Resistant     GENTAMICIN >=16 RESISTANT Resistant     IMIPENEM <=0.25  SENSITIVE Sensitive     NITROFURANTOIN 32 SENSITIVE Sensitive     TRIMETH/SULFA <=20 SENSITIVE Sensitive     AMPICILLIN/SULBACTAM >=32 RESISTANT Resistant     PIP/TAZO 32 INTERMEDIATE Intermediate     Extended ESBL POSITIVE Resistant     * >=  100,000 COLONIES/mL KLEBSIELLA PNEUMONIAE  MRSA PCR Screening     Status: None   Collection Time: 03/12/16  8:37 PM  Result Value Ref Range Status   MRSA by PCR NEGATIVE NEGATIVE Final    Comment:        The GeneXpert MRSA Assay (FDA approved for NASAL specimens only), is one component of a comprehensive MRSA colonization surveillance program. It is not intended to diagnose MRSA infection nor to guide or monitor treatment for MRSA infections.     Radiology Reports Ct Head Wo Contrast  Result Date: 03/14/2016 CLINICAL DATA:  Followup left subdural hematoma. EXAM: CT HEAD WITHOUT CONTRAST TECHNIQUE: Contiguous axial images were obtained from the base of the skull through the vertex without intravenous contrast. COMPARISON:  03/13/2016 FINDINGS: Brain: Very slight increase in volume of the left lateral convexity subdural hematoma, particularly notable anteriorly with a maximal thickness has increased from 13 mm to 15 mm. Posteriorly, thickness is stable at 15 to 16 mm. The amount of hyperdense blood within the subdural collection is slightly increased. No significant mass effect or any midline shift, secondary to pre-existing brain atrophy. Chronic small-vessel ischemic changes as noted previously. No hydrocephalus. Vascular: There is atherosclerotic calcification of the major vessels at the base of the brain. Skull: Negative Sinuses/Orbits: Clear/normal Other: None significant IMPRESSION: Very slight additional bleeding into the left subdural hematoma with slight increased thickness anteriorly, from 13 mm to 15 mm. Stable posteriorly. No measurable increased mass effect. No shift. Electronically Signed   By: Paulina Fusi M.D.   On: 03/14/2016 13:54    Ct Head Wo Contrast  Result Date: 03/13/2016 CLINICAL DATA:  Subdural hematoma EXAM: CT HEAD WITHOUT CONTRAST TECHNIQUE: Contiguous axial images were obtained from the base of the skull through the vertex without intravenous contrast. COMPARISON:  03/12/2016, 08/14/2015 FINDINGS: Brain: No large vessel territorial infarction or focal mass lesion is visualized. Again visualized is a mixed density left convexity subdural hematoma, this measures 1.7 cm in maximum thickness at the posterior frontal lobe, it is grossly unchanged compared with 03/12/2016 maximal measurement of 17 mm. Minimal all 1 mm midline shift to the right does not appear significantly changed. There is atrophy. Periventricular white matter hypodensity consistent with small vessel ischemic changes. The ventricles are stable in size. Vascular: No hyperdense vessels.  Carotid artery calcifications. Skull: No fracture.  Minimal fluid in the inferior mastoids. Sinuses/Orbits: No acute abnormality Other: Small posterior scalp swelling. IMPRESSION: 1. No significant interval change in the acute on chronic left hemispheric subdural hematoma compared with 03/12/2016, measuring 17 mm in maximum thickness compared with 17 mm on 03/12/2016. 2. Mild to moderate periventricular small vessel ischemic changes. Electronically Signed   By: Jasmine Pang M.D.   On: 03/13/2016 01:53   Ct Head Wo Contrast  Result Date: 03/12/2016 CLINICAL DATA:  Unwitnessed fall.  Found on floor. EXAM: CT HEAD WITHOUT CONTRAST CT CERVICAL SPINE WITHOUT CONTRAST TECHNIQUE: Multidetector CT imaging of the head and cervical spine was performed following the standard protocol without intravenous contrast. Multiplanar CT image reconstructions of the cervical spine were also generated. COMPARISON:  08/14/2015 FINDINGS: CT HEAD FINDINGS Brain: Mixed density subdural fluid collection noted on the left measuring up to 17 mm in thickness compared to 10 mm previously, compatible with acute  on chronic left subdural hematoma. No intraparenchymal hemorrhage or hydrocephalus. No acute infarction. Mild chronic microvascular disease throughout the deep white matter. No midline shift. Vascular: No hyperdense vessel or unexpected calcification. Skull: No acute calvarial  abnormality. Sinuses/Orbits: Visualized paranasal sinuses and mastoids clear. Orbital soft tissues unremarkable. Other: None CT CERVICAL SPINE FINDINGS Alignment: Normal Skull base and vertebrae: No acute fracture. No primary bone lesion or focal pathologic process. Soft tissues and spinal canal: No prevertebral fluid or swelling. No visible canal hematoma. Disc levels: Degenerative disc disease changes at C5-6 and C6-7 with spurring and disc space narrowing. Upper chest: Negative Other: None IMPRESSION: Mixed density left subdural hematoma, enlarged since prior study compatible with acute on chronic subdural hematoma. No significant midline shift. Chronic microvascular disease. No acute findings in the cervical spine. Critical Value/emergent results were called by telephone at the time of interpretation on 03/12/2016 at 11:58 am to Dr. Mancel Bale , who verbally acknowledged these results. Electronically Signed   By: Charlett Nose M.D.   On: 03/12/2016 11:59   Ct Cervical Spine Wo Contrast  Result Date: 03/12/2016 CLINICAL DATA:  Unwitnessed fall.  Found on floor. EXAM: CT HEAD WITHOUT CONTRAST CT CERVICAL SPINE WITHOUT CONTRAST TECHNIQUE: Multidetector CT imaging of the head and cervical spine was performed following the standard protocol without intravenous contrast. Multiplanar CT image reconstructions of the cervical spine were also generated. COMPARISON:  08/14/2015 FINDINGS: CT HEAD FINDINGS Brain: Mixed density subdural fluid collection noted on the left measuring up to 17 mm in thickness compared to 10 mm previously, compatible with acute on chronic left subdural hematoma. No intraparenchymal hemorrhage or hydrocephalus. No acute  infarction. Mild chronic microvascular disease throughout the deep white matter. No midline shift. Vascular: No hyperdense vessel or unexpected calcification. Skull: No acute calvarial abnormality. Sinuses/Orbits: Visualized paranasal sinuses and mastoids clear. Orbital soft tissues unremarkable. Other: None CT CERVICAL SPINE FINDINGS Alignment: Normal Skull base and vertebrae: No acute fracture. No primary bone lesion or focal pathologic process. Soft tissues and spinal canal: No prevertebral fluid or swelling. No visible canal hematoma. Disc levels: Degenerative disc disease changes at C5-6 and C6-7 with spurring and disc space narrowing. Upper chest: Negative Other: None IMPRESSION: Mixed density left subdural hematoma, enlarged since prior study compatible with acute on chronic subdural hematoma. No significant midline shift. Chronic microvascular disease. No acute findings in the cervical spine. Critical Value/emergent results were called by telephone at the time of interpretation on 03/12/2016 at 11:58 am to Dr. Mancel Bale , who verbally acknowledged these results. Electronically Signed   By: Charlett Nose M.D.   On: 03/12/2016 11:59   Dg Chest Port 1 View  Result Date: 03/12/2016 CLINICAL DATA:  Cough. EXAM: PORTABLE CHEST 1 VIEW COMPARISON:  Radiographs of August 16, 2015. FINDINGS: The heart size and mediastinal contours are within normal limits. Both lungs are clear. No pneumothorax or pleural effusion is noted. Left-sided pacemaker is unchanged in position. The visualized skeletal structures are unremarkable. IMPRESSION: No acute cardiopulmonary abnormality seen. Electronically Signed   By: Lupita Raider, M.D.   On: 03/12/2016 09:30     CBC  Recent Labs Lab 03/12/16 0840 03/12/16 1718 03/14/16 0354 03/15/16 0223  WBC 5.5 3.2* 2.9* 3.8*  HGB 11.0* 8.9* 8.5* 8.9*  HCT 31.7* 25.2* 25.5* 26.2*  PLT 100* 84* 74* 78*  MCV 85.9 85.4 88.2 87.3  MCH 29.8 30.2 29.4 29.7  MCHC 34.7 35.3 33.3 34.0   RDW 15.1 15.0 16.1* 15.4  LYMPHSABS 0.7 0.6*  --   --   MONOABS 0.2 0.1  --   --   EOSABS 0.0 0.0  --   --   BASOSABS 0.0 0.0  --   --  Chemistries   Recent Labs Lab 03/12/16 0840 03/12/16 1718 03/13/16 0916 03/14/16 0354 03/15/16 0223  NA 135 138  --  137 132*  K 3.0* 2.9*  --  4.5 4.5  CL 106 111  --  115* 107  CO2 22 20*  --  16* 17*  GLUCOSE 116* 56*  --  124* 262*  BUN 23* 23*  --  13 12  CREATININE 1.30* 1.10  --  1.25* 1.35*  CALCIUM 8.3* 7.6*  --  7.5* 8.2*  MG  --   --  1.4*  --  1.5*  AST 36 24  --   --  22  ALT 16* 16*  --   --  17  ALKPHOS 104 83  --   --  84  BILITOT 0.5 0.4  --   --  0.9   ------------------------------------------------------------------------------------------------------------------ estimated creatinine clearance is 36.7 mL/min (by C-G formula based on SCr of 1.35 mg/dL (H)). ------------------------------------------------------------------------------------------------------------------ No results for input(s): HGBA1C in the last 72 hours. ------------------------------------------------------------------------------------------------------------------ No results for input(s): CHOL, HDL, LDLCALC, TRIG, CHOLHDL, LDLDIRECT in the last 72 hours. ------------------------------------------------------------------------------------------------------------------  Recent Labs  03/13/16 0916  TSH 5.128*   ------------------------------------------------------------------------------------------------------------------ No results for input(s): VITAMINB12, FOLATE, FERRITIN, TIBC, IRON, RETICCTPCT in the last 72 hours.  Coagulation profile  Recent Labs Lab 03/12/16 0840  INR 1.01    No results for input(s): DDIMER in the last 72 hours.  Cardiac Enzymes No results for input(s): CKMB, TROPONINI, MYOGLOBIN in the last 168 hours.  Invalid input(s):  CK ------------------------------------------------------------------------------------------------------------------ Invalid input(s): POCBNP   CBG:  Recent Labs Lab 03/14/16 1845 03/14/16 2143 03/15/16 0001 03/15/16 0403 03/15/16 0827  GLUCAP 157* 191* 237* 289* 313*       Studies: Ct Head Wo Contrast  Result Date: 03/14/2016 CLINICAL DATA:  Followup left subdural hematoma. EXAM: CT HEAD WITHOUT CONTRAST TECHNIQUE: Contiguous axial images were obtained from the base of the skull through the vertex without intravenous contrast. COMPARISON:  03/13/2016 FINDINGS: Brain: Very slight increase in volume of the left lateral convexity subdural hematoma, particularly notable anteriorly with a maximal thickness has increased from 13 mm to 15 mm. Posteriorly, thickness is stable at 15 to 16 mm. The amount of hyperdense blood within the subdural collection is slightly increased. No significant mass effect or any midline shift, secondary to pre-existing brain atrophy. Chronic small-vessel ischemic changes as noted previously. No hydrocephalus. Vascular: There is atherosclerotic calcification of the major vessels at the base of the brain. Skull: Negative Sinuses/Orbits: Clear/normal Other: None significant IMPRESSION: Very slight additional bleeding into the left subdural hematoma with slight increased thickness anteriorly, from 13 mm to 15 mm. Stable posteriorly. No measurable increased mass effect. No shift. Electronically Signed   By: Paulina Fusi M.D.   On: 03/14/2016 13:54      Lab Results  Component Value Date   HGBA1C 8.0 (H) 11/01/2014   HGBA1C 8.5 06/13/2013   HGBA1C 10.9 02/19/2013   Lab Results  Component Value Date   CREATININE 1.35 (H) 03/15/2016       Scheduled Meds: . ceFEPime (MAXIPIME) IV  1 g Intravenous BID  . hydrocortisone sod succinate (SOLU-CORTEF) inj  25 mg Intravenous Q8H  . insulin aspart  0-9 Units Subcutaneous TID WC  . levETIRAcetam  500 mg Intravenous  Q12H  . mouth rinse  15 mL Mouth Rinse BID  . sodium chloride flush  3 mL Intravenous Q12H  . vancomycin  1,000 mg Intravenous Q24H   Continuous Infusions:  LOS: 3 days    Time spent: >30 MINS    [facility] Healthcare SystemBROL,Naithan Delage  [facility] Pager [phone]. If 7PM-7AM, please contact night-coverage at [email or web address], password [email or web address] 03/15/2016, 10:00 AM  LOS: 3 days

## 2016-03-15 NOTE — Care Management Note (Signed)
Case Management Note  Patient Details  Name: Dakota White MRN: [id number] Date of Birth: 03/15/1928  Subjective/Objective:   CM met with daughter, [name] Nippert-Lewis, who lives in [place], [place].  She has both healthcare and durable POA and has decided she wants pt to transfer to SNF in [place] so she can be more involved on a day-to-day basis.  CSW is aware and following.  Daughter is aware that she will be responsible for transportation costs.                             Expected Discharge Plan:  Skilled Nursing Facility  In-House Referral:  Clinical Social Work  Discharge planning Services  CM Consult  Status of Service:  In process, will continue to follow  ,  T, RN 03/15/2016, 1:50 PM  

## 2016-03-15 NOTE — Telephone Encounter (Signed)
New message    Daughter calling - [facility] 4E room  23   Daughter stated that her brother has taken him off of medication for couple of week. Wants to discuss concerns with Dr. Katrinka BlazingSmith.   Daughter wants Dr. Katrinka BlazingSmith to know and could he come by the room to see her / him

## 2016-03-15 NOTE — Progress Notes (Signed)
Physical Therapy Treatment Patient Details Name: Dakota White MRN: [id number] DOB: 04/01/1928 Today's Date: 03/15/2016    History of Present Illness 87-yo male with a pmh significant for dementia, history of fall with hipsx in 2017. Patient has underlying history of recurrent urinary tract infections, falls, history of chronic subdural brain bleeds. Presents to [facility] with new onset confusion, continued weakness and unwitnessed fall.    PT Comments    Patient seen for mobility progression, session limited to PROM and EOB activities. Patient continues to demonstrates pain in right hip which causes self limiting during activity. Patient did tolerate ~15 mins EOB, will continue to see and progress as tolerated. VSS throughout session  Follow Up Recommendations  Supervision/Assistance - 24 hour (Hospice vs SNF?)     Equipment Recommendations  Hospital bed (hoter lift)    Recommendations for Other Services       Precautions / Restrictions Precautions Precautions: Fall Restrictions Weight Bearing Restrictions: No    Mobility  Bed Mobility Overal bed mobility: Needs Assistance Bed Mobility: Rolling;Sidelying to Sit;Sit to Sidelying Rolling: Max assist Sidelying to sit: Total assist;+2 for physical assistance     Sit to sidelying: +2 for physical assistance;Total assist General bed mobility comments: Pt with  preference for laying on R side due to hip pain, needs assist for all aspects of bed mobility  Transfers                 General transfer comment: unable to perform, limited session to EOB  Ambulation/Gait                 Stairs            Wheelchair Mobility    Modified Rankin (Stroke Patients Only)       Balance Overall balance assessment: Needs assistance;History of Falls Sitting-balance support: Bilateral upper extremity supported;Feet supported Sitting balance-Leahy Scale: Poor Sitting balance - Comments: mod to max assist to maintain  midline, heavy lean to L side Postural control: Left lateral lean                          Cognition Arousal/Alertness: Awake/alert Behavior During Therapy: Flat affect Overall Cognitive Status: History of cognitive impairments - at baseline                      Exercises Other Exercises Other Exercises: PROM performed Bilateral LEs, limited passive ROM R>L due to pain Other Exercises: Passive Ankle ROM Other Exercises: performed static sitting balance at EOB ~ 15 mins    General Comments        Pertinent Vitals/Pain Pain Assessment: Faces Faces Pain Scale: Hurts even more Pain Location: R hip Pain Descriptors / Indicators: Discomfort;Grimacing;Guarding;Moaning Pain Intervention(s): Repositioned;Monitored during session    Home Living Family/patient expects to be discharged to:: Private residence Living Arrangements: Children Available Help at Discharge: Personal care attendant Type of Home: House Home Access: Stairs to enter   Home Layout: One level Home Equipment: Environmental consultant - 2 wheels;Bedside commode;Wheelchair - manual Additional Comments: has caregiver [phone], son and grandson stay at night.    Prior Function Level of Independence: Needs assistance      Comments: family reports significant R Hip pain at baseline   PT Goals (current goals can now be found in the care plan section) Acute Rehab PT Goals Patient Stated Goal: none stated PT Goal Formulation: Patient unable to participate in goal setting Time For Goal Achievement: 03/27/16  Potential to Achieve Goals: Fair Progress towards PT goals: Not progressing toward goals - comment    Frequency    Min 2X/week      PT Plan Current plan remains appropriate    Co-evaluation PT/OT/SLP Co-Evaluation/Treatment: Yes Reason for Co-Treatment: For patient/therapist safety PT goals addressed during session: Balance;Strengthening/ROM OT goals addressed during session: ADL's and self-care      End of Session   Activity Tolerance: Patient limited by pain Patient left: in bed;with call bell/phone within reach;with bed alarm set;with family/visitor present     Time: [id number] PT Time Calculation (min) (ACUTE ONLY): 28 min  Charges:  $Therapeutic Activity: 8-22 mins                    G Codes:      Fabio AsaDevon J Meredith Kilbride 03/15/2016, 10:34 AM Charlotte Crumbevon Cordia Miklos, PT DPT  [phone]

## 2016-03-15 NOTE — Progress Notes (Signed)
Daily Progress Note   Patient Name: Dakota White       Date: 03/15/2016 DOB: December 03, 1928  Age: 83 y.o. MRN#: [id number] Attending Physician: Richarda Overlie, MD Primary Care Physician: Mickie Hillier, MD Admit Date: 03/12/2016  Reason for Consultation/Follow-up: Establishing goals of care  Subjective:  more awake, responding to daughter, follows simple commands Speech is still mumbled Does not appear to be in acute distress Still NPO, daughter requesting repeat speech eval.  Daughter betsy at the bedside see below:   Length of Stay: 3  Current Medications: Scheduled Meds:  . hydrocortisone sod succinate (SOLU-CORTEF) inj  25 mg Intravenous Q8H  . levETIRAcetam  500 mg Intravenous Q12H  . mouth rinse  15 mL Mouth Rinse BID  . sodium chloride flush  3 mL Intravenous Q12H    Continuous Infusions: . sodium chloride      PRN Meds: LORazepam, morphine injection  Physical Exam         Weak frail elderly gentleman Both feet are in boots Muscle wasting S1 S2 Has low temp ongoing Shallow breath sounds  Vital Signs: BP (!) 154/70   Pulse 94   Temp 98.1 F (36.7 C) (Oral)   Resp 19   Ht 6' (1.829 m)   Wt 64.8 kg (142 lb 13.7 oz)   SpO2 98%   BMI 19.38 kg/m  SpO2: SpO2: 98 % O2 Device: O2 Device: Not Delivered O2 Flow Rate:    Intake/output summary:   Intake/Output Summary (Last 24 hours) at 03/15/16 1008 Last data filed at 03/15/16 0700  Gross per 24 hour  Intake             1733 ml  Output             2950 ml  Net            -1217 ml   LBM: Last BM Date: 03/13/16 Baseline Weight: Weight: 64.8 kg (142 lb 13.7 oz) Most recent weight: Weight: 64.8 kg (142 lb 13.7 oz)       Palliative Assessment/Data:    Flowsheet Rows   Flowsheet Row Most Recent Value  Intake Tab   Referral Department  Hospitalist  Unit at Time of Referral  Intermediate Care Unit  Palliative Care Primary Diagnosis  Other (Comment)  Palliative Care Type  Return patient  Palliative Care  Reason for referral  Clarify Goals of Care  Date first seen by Palliative Care  03/13/16  Clinical Assessment  Palliative Performance Scale Score  20%  Pain Max last 24 hours  4  Pain Min Last 24 hours  3  Dyspnea Max Last 24 Hours  4  Dyspnea Min Last 24 hours  3  Nausea Max Last 24 Hours  4  Nausea Min Last 24 Hours  3  Anxiety Max Last 24 Hours  6  Anxiety Min Last 24 Hours  5  Psychosocial & Spiritual Assessment  Palliative Care Outcomes  Patient/Family meeting held?  Yes  Who was at the meeting?  hcpoa daughter betsy   Palliative Care Outcomes  Clarified goals of care      Patient Active Problem List   Diagnosis Date Noted  . Pressure injury of skin 03/14/2016  . Hypothermia   . Seizure (HCC)   . Cough   . Fall   . SDH (subdural hematoma) (HCC)   . Closed right hip fracture (HCC) 08/08/2015  . Hip fracture (HCC) 08/08/2015  . Pre-operative cardiovascular examination 08/08/2015  . Fall at home-(recurrent falls) 08/08/2015  . Malnutrition of moderate degree 08/08/2015  . Subdural hematoma (HCC)   . Mixed hyperlipidemia   . Anemia of chronic disease 11/03/2014  . Thrombocytopenia (HCC) 11/03/2014  . BPH (benign prostatic hyperplasia) 11/02/2014  . General weakness 11/02/2014  . UTI (lower urinary tract infection) 11/01/2014  . Chronic combined systolic and diastolic congestive heart failure (HCC) 11/01/2014  . Chronic kidney disease, stage III (moderate) 10/21/2014  . History of permanent cardiac pacemaker placement 08/20/2013  . DM (diabetes mellitus), type 2, uncontrolled, with renal complications (HCC) 02/01/2012    Class: Chronic  . Dementia 01/30/2012    Class: Chronic  . Essential hypertension 11/17/2007    Palliative Care Assessment & Plan   Patient Profile:     Assessment:  Mr. Mudry is a 83 year old gentleman with a past medical history significant for dementia diagnosed in 2011, history of fall and hip surgery requiring repair in July 2017. Patient has underlying history of recurrent urinary tract infections, falls, history of chronic subdural brain bleeds. Patient lives at home with son and grandson will function as his 61 7 caregivers. Daughter [name] visits regularly from [place] and is actively involved in the patient's healthcare.  The patient reportedly has been more confused for the past couple of days, was found to have slid off his bed and was found on the floor. Patient was initially taken to [facility], now transferred to [facility]. Patient's acute active issues in this hospitalization are acute on chronic subdural bleed, possible benzodiazepine withdrawal related seizures, possible hypo-glycemia related seizures. Patient was found to have a core temperature of 84.91F and blood sugars of 35 at the time of his arrival to the emergency department. Also found to have a urine infection. Palliative care consult at 4 goals of care discussions.   Recommendations/Plan:   disposition: daughter asking for SNF rehab in [place]. She is the HCPOA agent. Social work consult request has already been placed.   Continue PRN Morphine  No PEG, discussed against artificial nutrition and hydration, hopefully can get started on some POs after repeat speech eval.    Code Status:    Code Status Orders        Start     Ordered   03/12/16 1457  Do not attempt resuscitation (DNR)  Continuous  Question Answer Comment  In the event of cardiac or respiratory ARREST Do not call a "code blue"   In the event of cardiac or respiratory ARREST Do not perform Intubation, CPR, defibrillation or ACLS   In the event of cardiac or respiratory ARREST Use medication by any route, position, wound care, and other measures to relive pain and  suffering. May use oxygen, suction and manual treatment of airway obstruction as needed for comfort.      03/12/16 1500    Code Status History    Date Active Date Inactive Code Status Order ID Comments User Context   08/08/2015 11:41 AM 08/14/2015  6:08 PM DNR [id number]  Eldred MangesMark C Yates, MD Inpatient   08/08/2015  2:07 AM 08/08/2015 11:41 AM DNR [id number]  Eduard ClosArshad N Kakrakandy, MD ED   11/01/2014  2:05 AM 11/04/2014  6:16 PM Full Code [id number]  Alberteen Samhristopher P Danford, MD Inpatient   10/22/2014  4:41 AM 10/24/2014  6:34 PM Full Code [id number]  Hillary BowJared M Gardner, DO ED   10/16/2014  5:45 PM 10/17/2014  7:15 PM Full Code [id number]  Richarda OverlieNayana Abrol, MD ED       Prognosis:   guarded  Discharge Planning:  To Be Determined  Care plan was discussed with  Daughter   Thank you for allowing the Palliative Medicine Team to assist in the care of this patient.   Time In: 9 Time Out: 9.35 Total Time 35 Prolonged Time Billed  no       Greater than 50%  of this time was spent counseling and coordinating care related to the above assessment and plan.  Rosalin HawkingZeba Melena Hayes, MD [phone]  Please contact Palliative Medicine Team phone at [phone] for questions and concerns.

## 2016-03-16 DIAGNOSIS — A498 Other bacterial infections of unspecified site: Secondary | ICD-10-CM

## 2016-03-16 DIAGNOSIS — T68XXXS Hypothermia, sequela: Secondary | ICD-10-CM

## 2016-03-16 DIAGNOSIS — A414 Sepsis due to anaerobes: Secondary | ICD-10-CM

## 2016-03-16 DIAGNOSIS — Z1612 Extended spectrum beta lactamase (ESBL) resistance: Secondary | ICD-10-CM

## 2016-03-16 LAB — GLUCOSE, CAPILLARY
GLUCOSE-CAPILLARY: 126 mg/dL — AB (ref 65–99)
GLUCOSE-CAPILLARY: 183 mg/dL — AB (ref 65–99)
GLUCOSE-CAPILLARY: 184 mg/dL — AB (ref 65–99)
Glucose-Capillary: 161 mg/dL — ABNORMAL HIGH (ref 65–99)
Glucose-Capillary: 233 mg/dL — ABNORMAL HIGH (ref 65–99)
Glucose-Capillary: 188 mg/dL — ABNORMAL HIGH (ref 65–99)

## 2016-03-16 LAB — CBC
HCT: 27.7 % — ABNORMAL LOW (ref 39.0–52.0)
HEMOGLOBIN: 9.4 g/dL — AB (ref 13.0–17.0)
MCH: 29.6 pg (ref 26.0–34.0)
MCHC: 33.9 g/dL (ref 30.0–36.0)
MCV: 87.1 fL (ref 78.0–100.0)
PLATELETS: 76 10*3/uL — AB (ref 150–400)
RBC: 3.18 MIL/uL — AB (ref 4.22–5.81)
RDW: 15.2 % (ref 11.5–15.5)
WBC: 3.8 10*3/uL — AB (ref 4.0–10.5)

## 2016-03-16 NOTE — Progress Notes (Signed)
Patient ID: Dakota White, male   DOB: [birth year], 83 y.o.   MRN: [id number]  PROGRESS NOTE    Dakota White  Dakota White DOB: 06/27/1928 DOA: 03/12/2016  PCP: Georges Lynch, MD   Brief Narrative:   83 y.o.male with past medical history significant for chronic diastolic CHF, ejection fraction 35-40% in September 2016, symptomatic bradycardia status post pacemaker placement, hypertension, dementia, diabetes mellitus, chronic kidney disease stage III with baseline creatinine 1.53. He was hospitalized in July 2017 for right hip fracture and underwent right hip hemiarthroplasty. He was discharged to skilled nursing facility but unable to have aspirin on Lovenox because of concomitant subdural hematoma.  Patient presented 03/12/2016 status post fall. Speaking with his daughter at the bedside 03/16/2016, apparently patient's son was taking care of him and stopped all of his medications for past 3 months, has not really taken the patient to see a doctor, there was even a question of if patient was fed at all because when a home health nurse came to home she reported there was no food for patient to eat. On this admission, patient was obtunded, CT head showed acute on chronic subdural hemorrhage and per further talk with family, they declined craniotomy. Palliative was consulted for goals of care. Patient's daughter would like to take patient to [place] and have him in skilled nursing facility tear where she could visit he more often and provide care that he needs.  Assessment & Plan:  Subdural hematoma / Mechanical fall  - Acute on chronic subdural hematoma with no midline shift.  - Case discussed with neurosurgery and neurology , transferred to [facility].  - Patient with clonus seen by EDP thought to be a seizure. Was loaded with Keppra and given Ativan.  - Currently on Keppra 500 mg IV Q 12 hours  - Status post EEG - suggestive of generalized cerebral dysfunction. - Repeat CT scan shows  slight increase in size of subdural hematoma since admission CT scan  - Patient has a pacemaker so unable to perform MRI to rule out CVA - Family declined craniotomy   Hypoglycemia / diabetes mellitus2 without complications with long term insulin use - Patient has recurrent hypoglycemia likely due to sepsis - CBG's in past 24 hours: 164, 161, 126 - He is not on any insulin regimen because of episodes of hypoglycemia - He is on stress dose steroids which we will taper slowly   Sepsis secondary to ESBL UTI / Aerococcus viridans bacteremia / Hypothermia - Has met sepsis criteria and based on urine culture and blood culture looks like ESBL UTI as well as Aerococcus viridans bacteremia - He is currently on meropenem - Will continue stress dose steroids because of ongoing hypothermia  Severe Dementia without behavioral disturbance - More alert this morning - Plan for discharge to skilled nursing facility once medically stable however still deciding whether this would be here in [place] or [place] area  Hypokalemia / hypomagnesemia - Due to sepsis - Supplemented - Follow-up BMP tomorrow morning and magnesium level tomorrow morning  Thrombocytopenia - Chronic, likely in the setting of subdural hematoma - Continue to monitor CBC daily  Pressure injury of the skin - Appreciate wound care assessment  Adult failure to thrive / severe protein calorie malnutrition - Evaluated by swallow evaluation team, determined to be at high risk of aspiration but comfort feeding allowed    DVT prophylaxis: SCD's bilaterally  Code Status: DNR/DNI Family Communication: daughter at the bedside this am Disposition  Plan: not yet stable for d/c; possibly can go to [place] with daughter if his condition remains stable; at this time we are treating ESBL UTI; he is still NPO with option of comfort feeds only; PCT assisting with goals of care    Consultants:   Palliative care    Neurology  SLP - severe aspiration risk as of 2/11; comfort feeds only   WOC  Procedures:   None   Antimicrobials:   Rocephin 03/12/2016   Cefepime 03/12/2016 --> 03/15/2016   Vanco 03/14/2016 --> 03/15/2016  Meropenem 03/15/2016 -->   Subjective: No overnight events.  Objective: Vitals:   03/16/16 0406 03/16/16 0600 03/16/16 0700 03/16/16 0801  BP:  (!) 108/54  (!) 142/72  Pulse:  88 89 68  Resp:  16 13 13   Temp:  97.7 F (36.5 C) (!) 96.6 F (35.9 C) (!) 96.6 F (35.9 C)  TempSrc:    Oral  SpO2:  100% 99% 99%  Weight: 65.6 kg (144 lb 10 oz)     Height:        Intake/Output Summary (Last 24 hours) at 03/16/16 1104 Last data filed at 03/16/16 0919  Gross per 24 hour  Intake             2013 ml  Output             3075 ml  Net            -1062 ml   Filed Weights   03/12/16 2031 03/16/16 0406  Weight: 64.8 kg (142 lb 13.7 oz) 65.6 kg (144 lb 10 oz)    Examination:  General exam: Appears calm and comfortable, more alert this am Respiratory system: Clear to auscultation. Respiratory effort normal. Cardiovascular system: S1 & S2 heard, Rate controlled, No pedal edema. Gastrointestinal system: Abdomen is nondistended, soft and nontender. No organomegaly or masses felt. Normal bowel sounds heard. Central nervous system: Disoriented but more alert  Extremities: Symmetric 5 x 5 power. Skin: has few skin tears on arms, skin lesions on anterior shin Psychiatry: not restless or agitated   Data Reviewed: I have personally reviewed following labs and imaging studies  CBC:  Recent Labs Lab 03/12/16 0840 03/12/16 1718 03/14/16 0354 03/15/16 0223  WBC 5.5 3.2* 2.9* 3.8*  NEUTROABS 4.6 2.5  --   --   HGB 11.0* 8.9* 8.5* 8.9*  HCT 31.7* 25.2* 25.5* 26.2*  MCV 85.9 85.4 88.2 87.3  PLT 100* 84* 74* 78*   Basic Metabolic Panel:  Recent Labs Lab 03/12/16 0840 03/12/16 1718 03/13/16 0916 03/14/16 0354 03/15/16 0223  NA 135 138  --  137 132*  K 3.0* 2.9*   --  4.5 4.5  CL 106 111  --  115* 107  CO2 22 20*  --  16* 17*  GLUCOSE 116* 56*  --  124* 262*  BUN 23* 23*  --  13 12  CREATININE 1.30* 1.10  --  1.25* 1.35*  CALCIUM 8.3* 7.6*  --  7.5* 8.2*  MG  --   --  1.4*  --  1.5*   GFR: Estimated Creatinine Clearance: 37.1 mL/min (by C-G formula based on SCr of 1.35 mg/dL (H)). Liver Function Tests:  Recent Labs Lab 03/12/16 0840 03/12/16 1718 03/15/16 0223  AST 36 24 22  ALT 16* 16* 17  ALKPHOS 104 83 84  BILITOT 0.5 0.4 0.9  PROT 6.9 5.6* 5.6*  ALBUMIN 3.2* 2.5* 2.3*   No results for input(s): LIPASE, AMYLASE in  the last 168 hours. No results for input(s): AMMONIA in the last 168 hours. Coagulation Profile:  Recent Labs Lab 03/12/16 0840  INR 1.01   Cardiac Enzymes: No results for input(s): CKTOTAL, CKMB, CKMBINDEX, TROPONINI in the last 168 hours. BNP (last 3 results) No results for input(s): PROBNP in the last 8760 hours. HbA1C: No results for input(s): HGBA1C in the last 72 hours. CBG:  Recent Labs Lab 03/15/16 1652 03/15/16 2020 03/15/16 2334 03/16/16 0355 03/16/16 0759  GLUCAP 173* 216* 164* 161* 126*   Lipid Profile: No results for input(s): CHOL, HDL, LDLCALC, TRIG, CHOLHDL, LDLDIRECT in the last 72 hours. Thyroid Function Tests: No results for input(s): TSH, T4TOTAL, FREET4, T3FREE, THYROIDAB in the last 72 hours. Anemia Panel: No results for input(s): VITAMINB12, FOLATE, FERRITIN, TIBC, IRON, RETICCTPCT in the last 72 hours. Urine analysis:    Component Value Date/Time   COLORURINE AMBER (A) 03/12/2016 1300   APPEARANCEUR TURBID (A) 03/12/2016 1300   LABSPEC 1.010 03/12/2016 1300   PHURINE 7.0 03/12/2016 1300   GLUCOSEU NEGATIVE 03/12/2016 1300   HGBUR MODERATE (A) 03/12/2016 1300   BILIRUBINUR NEGATIVE 03/12/2016 1300   BILIRUBINUR NEG 11/11/2014 1517   KETONESUR NEGATIVE 03/12/2016 1300   PROTEINUR 30 (A) 03/12/2016 1300   UROBILINOGEN 0.2 11/11/2014 1517   UROBILINOGEN 1.0 10/31/2014 2218    NITRITE POSITIVE (A) 03/12/2016 1300   LEUKOCYTESUR LARGE (A) 03/12/2016 1300   Sepsis Labs: @LABRCNTIP (procalcitonin:4,lacticidven:4)  Culture, blood (Routine x 2)     Status: None (Preliminary result)   Collection Time: 03/12/16  8:30 AM  Result Value Ref Range Status   Specimen Description BLOOD RIGHT ANTECUBITAL  Final   Culture   Final    NO GROWTH 3 DAYS    Report Status PENDING  Incomplete  Culture, blood (Routine x 2)     Status: Abnormal   Collection Time: 03/12/16 10:30 AM  Result Value Ref Range Status   Specimen Description BLOOD RIGHT HAND  Final   Special Requests IN PEDIATRIC BOTTLE 4ML  Final   Culture  Setup Time   Final   Culture (A)  Final    AEROCOCCUS VIRIDANS    Report Status 03/14/2016 FINAL  Final  Blood Culture ID Panel (Reflexed)     Status: None   Collection Time: 03/12/16 10:30 AM  Result Value Ref Range Status   Enterococcus species NOT DETECTED NOT DETECTED Final   Listeria monocytogenes NOT DETECTED NOT DETECTED Final   Staphylococcus species NOT DETECTED NOT DETECTED Final   Staphylococcus aureus NOT DETECTED NOT DETECTED Final   Streptococcus species NOT DETECTED NOT DETECTED Final   Streptococcus agalactiae NOT DETECTED NOT DETECTED Final   Streptococcus pneumoniae NOT DETECTED NOT DETECTED Final   Streptococcus pyogenes NOT DETECTED NOT DETECTED Final   Acinetobacter baumannii NOT DETECTED NOT DETECTED Final   Enterobacteriaceae species NOT DETECTED NOT DETECTED Final   Enterobacter cloacae complex NOT DETECTED NOT DETECTED Final   Escherichia coli NOT DETECTED NOT DETECTED Final   Klebsiella oxytoca NOT DETECTED NOT DETECTED Final   Klebsiella pneumoniae NOT DETECTED NOT DETECTED Final   Proteus species NOT DETECTED NOT DETECTED Final   Serratia marcescens NOT DETECTED NOT DETECTED Final   Haemophilus influenzae NOT DETECTED NOT DETECTED Final   Neisseria meningitidis NOT DETECTED NOT DETECTED Final   Pseudomonas aeruginosa NOT  DETECTED NOT DETECTED Final   Candida albicans NOT DETECTED NOT DETECTED Final   Candida glabrata NOT DETECTED NOT DETECTED Final   Candida krusei NOT DETECTED NOT  DETECTED Final   Candida parapsilosis NOT DETECTED NOT DETECTED Final   Candida tropicalis NOT DETECTED NOT DETECTED Final    Comment: Performed at [facility], [place] [address], [place], [place] [address]  Urine culture     Status: Abnormal   Collection Time: 03/12/16  1:00 PM  Result Value Ref Range Status   Specimen Description URINE, CATHETERIZED  Final   Special Requests NONE  Final   Culture (A)  Final    >=100,000 COLONIES/mL KLEBSIELLA PNEUMONIAE    Report Status 03/15/2016 FINAL  Final      Susceptibility   Klebsiella pneumoniae - MIC*    AMPICILLIN >=32 RESISTANT Resistant     CEFAZOLIN >=64 RESISTANT Resistant     CEFTRIAXONE >=64 RESISTANT Resistant     CIPROFLOXACIN >=4 RESISTANT Resistant     GENTAMICIN >=16 RESISTANT Resistant     IMIPENEM <=0.25 SENSITIVE Sensitive     NITROFURANTOIN 32 SENSITIVE Sensitive     TRIMETH/SULFA <=20 SENSITIVE Sensitive     AMPICILLIN/SULBACTAM >=32 RESISTANT Resistant     PIP/TAZO 32 INTERMEDIATE Intermediate     Extended ESBL POSITIVE Resistant     * >=100,000 COLONIES/mL KLEBSIELLA PNEUMONIAE  MRSA PCR Screening     Status: None   Collection Time: 03/12/16  8:37 PM  Result Value Ref Range Status   MRSA by PCR NEGATIVE NEGATIVE Final      Radiology Studies: Ct Head Wo Contrast Result Date: 03/14/2016 Very slight additional bleeding into the left subdural hematoma with slight increased thickness anteriorly, from 13 mm to 15 mm. Stable posteriorly. No measurable increased mass effect. No shift. Electronically Signed   By: Nelson Chimes M.D.   On: 03/14/2016 13:54   Ct Head Wo Contrast Result Date: 03/13/2016 1. No significant interval change in the acute on chronic left hemispheric subdural hematoma compared with 03/12/2016, measuring 17 mm in maximum thickness  compared with 17 mm on 03/12/2016. 2. Mild to moderate periventricular small vessel ischemic changes. Electronically Signed   By: Donavan Foil M.D.   On: 03/13/2016 01:53   Ct Head Wo Contrast Result Date: 03/12/2016 Mixed density left subdural hematoma, enlarged since prior study compatible with acute on chronic subdural hematoma. No significant midline shift. Chronic microvascular disease. No acute findings in the cervical spine. Critical Value/emergent results were called by telephone at the time of interpretation on 03/12/2016 at 11:58 am to Dr. Daleen Bo , who verbally acknowledged these results. Electronically Signed   By: Rolm Baptise M.D.   On: 03/12/2016 11:59   Ct Cervical Spine Wo Contrast Result Date: 03/12/2016 Mixed density left subdural hematoma, enlarged since prior study compatible with acute on chronic subdural hematoma. No significant midline shift. Chronic microvascular disease. No acute findings in the cervical spine. Critical Value/emergent results were called by telephone at the time of interpretation on 03/12/2016 at 11:58 am to Dr. Daleen Bo , who verbally acknowledged these results. Electronically Signed   By: Rolm Baptise M.D.   On: 03/12/2016 11:59     Scheduled Meds: . hydrocortisone sod succinate (SOLU-CORTEF) inj  25 mg Intravenous Q8H  . levETIRAcetam  500 mg Intravenous Q12H  . mouth rinse  15 mL Mouth Rinse BID  . meropenem (MERREM) IV  1 g Intravenous Q12H  . sodium chloride flush  3 mL Intravenous Q12H   Continuous Infusions: . sodium chloride 75 mL/hr at 03/16/16 0104     LOS: 4 days    Time spent: 25 minutes  Greater  than 50% of the time spent on counseling and coordinating the care.   Leisa Lenz, MD [facility] Pager [phone]  If 7PM-7AM, please contact night-coverage [email or web address] Password [facility] 03/16/2016, 11:04 AM

## 2016-03-16 NOTE — Progress Notes (Signed)
CSW spoke with pt dtr concerning SNF placement and pt dtr now agreeable to local SNF search  CSW faxed referral to local SNFs and provided pt dtr with list of SNFs for review  Burna SisJenna H. Treston Coker, LCSW Clinical Social Worker [phone]

## 2016-03-16 NOTE — Consult Note (Signed)
WOC Nurse wound consult note Reason for Consult: sacrum pressure injury Wound type: Patient did not cooperate for assessment of sacrum today Right heel unstageable pressure injury Trauma wound left pretibial wound Pressure Injury POA: Yes Measurement: Right heel: 1.5cm x 1.5cmx 0cm Left pretibial: 10cm x 0.5cm x 0cm Wound bed: Right heel: 100% blakc Left pretibial: scabbed, partial thickness Drainage (amount, consistency, odor) minimal with slight odor from the right heel; none from the left pretibial Periwound: intact, some superficial skin peeling on the right heel Dressing procedure/placement/frequency: Prevalon boots in place already for offloading heels Silicone foam to the right heel and sacrum WOC nurse will attempt to see sacral wound again tomorrow, patient's daughter in agreement Discussed with bedside nurse, sacrum is partial thickness per her report.  Will add low air loss mattress for pressure redistribution, patient does not move much on his own.   Dakota White [facility] MSN,RN,CWOCN, CNS [phone]

## 2016-03-16 NOTE — Telephone Encounter (Signed)
She also wanted to know if any possible issues with pt stopping Coreg abruptly instead of weaning off?

## 2016-03-16 NOTE — Clinical Social Work Note (Signed)
Clinical Social Work Assessment  Patient Details  Name: Dakota White W Rowe MRN: [id number] Date of Birth: 07/15/1928  Date of referral:  03/15/16               Reason for consult:  Facility Placement                Permission sought to share information with:  Facility Medical sales representativeContact Representative, Family Supports Permission granted to share information::  Yes, Release of Information Signed  Name::     Nurse, children'sBetsy  Agency::  SNF  Relationship::  Public relations account executiveBetsy  Contact Information:     Housing/Transportation Living arrangements for the past 2 months:  Single Family Home Source of Information:  Adult Children, Power of Attorney Patient Interpreter Needed:  None Criminal Activity/Legal Involvement Pertinent to Current Situation/Hospitalization:  No - Comment as needed Significant Relationships:  Adult Children Lives with:  Adult Children (son) Do you feel safe going back to the place where you live?  No Need for family participation in patient care:  Yes (Comment) (decision making)  Care giving concerns:  Pt has been living with son but needing higher level of assistance at this time- dtr also concerned about pt care at home and would like [place] have [place] [facility]   Social Worker assessment / plan:  CSW spoke with pt dtr at bedside concerning plan for pt.  Pt dtr hopeful pt can be [place] so [place] can be more actively involved in his care ( she is POA and HCPOA).   CSW explained potential [facility]: ambulance cost (dtr states she can pay up front), coordinating for day of DC etc  Employment status:  Retired Community education officernsurance information:  Medicare PT Recommendations:  Skilled Nursing Facility Information / Referral to community resources:  Skilled Nursing Facility  Patient/Family's Response to care:  Pt dtr is agreeable to SNF placement and understands potential barriers to far away placement.  Patient/Family's Understanding of and Emotional Response to  Diagnosis, Current Treatment, and Prognosis:  Pt dtr very knowledgeable and realistic about pt condition/prognosis at this time.   Emotional Assessment Appearance:  Appears stated age Attitude/Demeanor/Rapport:  Unable to Assess Affect (typically observed):  Unable to Assess Orientation:  Oriented to Self Alcohol / Substance use:  Not Applicable Psych involvement (Current and /or in the community):  No (Comment)  Discharge Needs  Concerns to be addressed:  Care Coordination Readmission within the last 30 days:  No Current discharge risk:  Physical Impairment Barriers to Discharge:  Continued Medical Work up   Burna SisUris, Glorene Leitzke H, LCSW 03/16/2016, 11:51 AM

## 2016-03-16 NOTE — Telephone Encounter (Signed)
Informed daughter that Dr. Katrinka BlazingSmith was not aware of a Cardiologist in the [place] area.  Also advised her of information provided by Nicholaus BloomKelley.  Daughter appreciative for assistance.

## 2016-03-16 NOTE — Telephone Encounter (Signed)
Unfortunately, I do not know anyone in [place].

## 2016-03-16 NOTE — Telephone Encounter (Signed)
Spoke with Dakota DavidsonKelley Auten, [facility] and she said main concern with stopped Coreg abruptly would be that pt's HR would jump up.  She said since this was stopped 2 weeks ago, pt's body has likely compensated for this and should not have any issues at this time.

## 2016-03-16 NOTE — Progress Notes (Signed)
Speech Language Pathology Treatment: Dysphagia  Patient Details Name: Dakota White MRN: [id number] DOB: 1928/04/19 Today's Date: 03/16/2016 Time: [id number] SLP Time Calculation (min) (ACUTE ONLY): 10 min  Assessment / Plan / Recommendation Clinical Impression  Pt fully alert this am, tolerating sitting upright, able to participate with total assist feeding with minimal verbal cues. Pt now able to take sips of straw without additional effort, no signs of aspiration observed. Pt also tolerating purees adequately. Given improvement in mentation, pt likely to tolerate this diet well. Did discuss potential risk of aspiration given fluctuating mental status with dementia. Pt may benefit from diet upgrade as endurance continued to improve. Will f/u for upgraded trials.    HPI HPI: Dakota White is a 83 year old gentleman with a past medical history significant for dementia diagnosed in 2011, history of fall and hip surgery requiring repair in July 2017. Patient has underlying history of recurrent urinary tract infections, falls, history of chronic subdural brain bleeds. Patient lives at home with son and grandson will function as his [phone] caregivers. Patient's acute active issues in this hospitalization are acute on chronic subdural bleed, possible benzodiazepine withdrawal related seizures, possible hypo-glycemia related seizures. Patient was found to have a core temperature of 84.35F and blood sugars of 35 at the time of his arrival to the emergency department. Also found to have a urine infection.       SLP Plan  Continue with current plan of care     Recommendations  Diet recommendations: Dysphagia 1 (puree);Thin liquid Liquids provided via: Cup;Straw Medication Administration: Crushed with puree Supervision: Full supervision/cueing for compensatory strategies;Trained caregiver to feed patient Compensations: Slow rate;Small sips/bites Postural Changes and/or Swallow Maneuvers: Seated upright 90  degrees                Oral Care Recommendations: Oral care BID Follow up Recommendations: Skilled Nursing facility Plan: Continue with current plan of care       GO               [facility] Center LLCBonnie Shaka Zech, MA CCC-SLP [phone]  Dakota White, Dakota White 03/16/2016, 11:28 AM

## 2016-03-16 NOTE — [place] FL2 (Signed)
[place] MEDICAID FL2 LEVEL OF CARE SCREENING TOOL     IDENTIFICATION  Patient Name: Dakota White Birthdate: Dec 19, 1928 Sex: male Admission Date (Current Location): 03/12/2016  [facility] [place]   Facility and Address:  [facility], [address], [place], [place] [address]      Provider Number: [id number]  Attending Physician Name and Address:  Alison MurrayAlma M Devine, MD  Relative Name and Phone Number:       Current Level of Care: Hospital Recommended Level of Care: Skilled Nursing Facility Prior Approval Number:    Date Approved/Denied:   PASRR Number: [id number]  Discharge Plan: SNF    Current Diagnoses: Patient Active Problem List   Diagnosis Date Noted  . Pressure injury of skin 03/14/2016  . Hypothermia   . Seizure (HCC)   . Cough   . Fall   . SDH (subdural hematoma) (HCC)   . Closed right hip fracture (HCC) 08/08/2015  . Hip fracture (HCC) 08/08/2015  . Pre-operative cardiovascular examination 08/08/2015  . Fall at home-(recurrent falls) 08/08/2015  . Malnutrition of moderate degree 08/08/2015  . Subdural hematoma (HCC)   . Mixed hyperlipidemia   . Anemia of chronic disease 11/03/2014  . Thrombocytopenia (HCC) 11/03/2014  . BPH (benign prostatic hyperplasia) 11/02/2014  . General weakness 11/02/2014  . UTI (lower urinary tract infection) 11/01/2014  . Chronic combined systolic and diastolic congestive heart failure (HCC) 11/01/2014  . Chronic kidney disease, stage III (moderate) 10/21/2014  . History of permanent cardiac pacemaker placement 08/20/2013  . DM (diabetes mellitus), type 2, uncontrolled, with renal complications (HCC) 02/01/2012    Class: Chronic  . Dementia 01/30/2012    Class: Chronic  . Essential hypertension 11/17/2007    Orientation RESPIRATION BLADDER Height & Weight     Self, Time, Situation, Place  Normal Incontinent, Indwelling catheter Weight: 144 lb 10 oz (65.6 kg) Height:  6'  (182.9 cm)  BEHAVIORAL SYMPTOMS/MOOD NEUROLOGICAL BOWEL NUTRITION STATUS      Incontinent Diet (see DC summary)  AMBULATORY STATUS COMMUNICATION OF NEEDS Skin   Extensive Assist Verbally Normal                       Personal Care Assistance Level of Assistance  Bathing, Dressing, Feeding Bathing Assistance: Maximum assistance Feeding assistance: Maximum assistance Dressing Assistance: Maximum assistance     Functional Limitations Info  Hearing   Hearing Info: Impaired      SPECIAL CARE FACTORS FREQUENCY  PT (By licensed PT), OT (By licensed OT)     PT Frequency: 5/wk OT Frequency: 5/wk            Contractures      Additional Factors Info  Code Status, Allergies, Isolation Precautions Code Status Info: DNR Allergies Info: Actos Pioglitazone, Ramipril     Isolation Precautions Info: ESBL     Current Medications (03/16/2016):  This is the current hospital active medication list Current Facility-Administered Medications  Medication Dose Route Frequency Provider Last Rate Last Dose  . 0.9 %  sodium chloride infusion   Intravenous Continuous Richarda OverlieNayana Abrol, MD 75 mL/hr at 03/16/16 0104    . hydrocortisone sodium succinate (SOLU-CORTEF) 100 MG injection 25 mg  25 mg Intravenous Q8H Richarda OverlieNayana Abrol, MD   25 mg at 03/16/16 0903  . levETIRAcetam (KEPPRA) 500 mg in sodium chloride 0.9 % 100 mL IVPB  500 mg Intravenous Q12H Caryl PinaEric Lindzen, MD   500 mg at 03/16/16 0905  .  LORazepam (ATIVAN) injection 0.5 mg  0.5 mg Intravenous Q4H PRN Richarda Overlie, MD      . MEDLINE mouth rinse  15 mL Mouth Rinse BID Lenox Ponds, MD   15 mL at 03/16/16 0908  . meropenem (MERREM) 1 g in sodium chloride 0.9 % 100 mL IVPB  1 g Intravenous Q12H Para March Batchelder, RPH   1 g at 03/16/16 0905  . morphine 2 MG/ML injection 1 mg  1 mg Intravenous Q3H PRN Rosalin Hawking, MD   1 mg at 03/13/16 2002  . sodium chloride flush (NS) 0.9 % injection 3 mL  3 mL Intravenous Q12H Lenox Ponds, MD   3 mL at  03/16/16 0919     Discharge Medications: Please see discharge summary for a list of discharge medications.  Relevant Imaging Results:  Relevant Lab Results:   Additional Information SS#: [id number]  Burna Sis, LCSW

## 2016-03-16 NOTE — Telephone Encounter (Signed)
Daughter can ask the attending physician for this to be done. If not done in the hospital then patient has not been sen in our office since 2016, and that would be a good reason to get him in.

## 2016-03-17 DIAGNOSIS — R627 Adult failure to thrive: Secondary | ICD-10-CM

## 2016-03-17 LAB — CBC
HCT: 30.2 % — ABNORMAL LOW (ref 39.0–52.0)
Hemoglobin: 10.1 g/dL — ABNORMAL LOW (ref 13.0–17.0)
MCH: 29.5 pg (ref 26.0–34.0)
MCHC: 33.4 g/dL (ref 30.0–36.0)
MCV: 88.3 fL (ref 78.0–100.0)
PLATELETS: 72 10*3/uL — AB (ref 150–400)
RBC: 3.42 MIL/uL — AB (ref 4.22–5.81)
RDW: 15.5 % (ref 11.5–15.5)
WBC: 3.7 10*3/uL — ABNORMAL LOW (ref 4.0–10.5)

## 2016-03-17 LAB — GLUCOSE, CAPILLARY
GLUCOSE-CAPILLARY: 214 mg/dL — AB (ref 65–99)
GLUCOSE-CAPILLARY: 200 mg/dL — AB (ref 65–99)
Glucose-Capillary: 148 mg/dL — ABNORMAL HIGH (ref 65–99)
Glucose-Capillary: 194 mg/dL — ABNORMAL HIGH (ref 65–99)
Glucose-Capillary: 238 mg/dL — ABNORMAL HIGH (ref 65–99)

## 2016-03-17 LAB — CULTURE, BLOOD (ROUTINE X 2): CULTURE: NO GROWTH

## 2016-03-17 MED ORDER — HYDROCORTISONE NA SUCCINATE PF 100 MG IJ SOLR
25.0000 mg | Freq: Two times a day (BID) | INTRAMUSCULAR | Status: DC
  Administered 2016-03-17 – 2016-03-18 (×3): 25 mg via INTRAVENOUS
  Filled 2016-03-17 (×3): qty 2

## 2016-03-17 NOTE — Progress Notes (Signed)
Patient ID: Dakota White, male   DOB: November 23, 1928, 83 y.o.   MRN: [id number]  PROGRESS NOTE    Dakota White  EXB:[id number] DOB: [birth year] DOA: 03/12/2016  PCP: Georges Lynch, MD   Brief Narrative:   83 y.o.male with past medical history significant for chronic diastolic CHF, ejection fraction 35-40% in September 2016, symptomatic bradycardia status post pacemaker placement, hypertension, dementia, diabetes mellitus, chronic kidney disease stage III with baseline creatinine 1.53. He was hospitalized in July 2017 for right hip fracture and underwent right hip hemiarthroplasty. He was discharged to skilled nursing facility but unable to have aspirin on Lovenox because of concomitant subdural hematoma.  Patient presented 03/12/2016 status post fall. Speaking with his daughter at the bedside 03/16/2016, apparently patient's son was taking care of him and stopped all of his medications for past 3 months, has not really taken the patient to see a doctor, there was even a question of if patient was fed at all because when a home health nurse came to home she reported there was no food for patient to eat. On this admission, patient was obtunded, CT head showed acute on chronic subdural hemorrhage and per further talk with family, they declined craniotomy. Palliative was consulted for goals of care. Patient's daughter would like to take patient to [place] and have him in skilled nursing facility tear where she could visit he more often and provide care that he needs.  Assessment & Plan:  Subdural hematoma / Mechanical fall  - Acute on chronic subdural hematoma with no midline shift.  - Case discussed with neurosurgery and neurology , [facility].  - Patient with clonus seen by EDP thought to be a seizure. Was loaded with Keppra and given Ativan.  - Continue Keppra 500 mg IV Q 12 hours  - EEG - suggestive of generalized cerebral dysfunction. - Repeat CT scan showed slight increase  in size of subdural hematoma since admission CT scan  - Patient has a pacemaker so unable to perform MRI to rule out CVA - Family declined craniotomy   Hypoglycemia / diabetes mellitus2 without complications with long term insulin use - Patient has recurrent hypoglycemia likely due to sepsis - CBG's in past 24 hours: 148, 194, 238 - He is not on any insulin regimen because of episodes of hypoglycemia - He is on stress dose steroids, 25 mg IV q 8 hours; since his temp is now stable will reduce the frequency to every 12 hours   Sepsis secondary to ESBL UTI / Aerococcus viridans bacteremia / Hypothermia - Has met sepsis criteria and based on urine culture and blood culture looks like ESBL UTI as well as Aerococcus viridans bacteremia - Continue meropenem - Continue stress dose steroids   Severe Dementia without behavioral disturbance - Plan for discharge to skilled nursing facility once medically stable however still deciding whether this would be here in [place] or [place] area  Hypokalemia / hypomagnesemia - Due to sepsis - Supplemented - Follow-up BMP and magnesium level tomorrow morning  Thrombocytopenia - Chronic, likely in the setting of subdural hematoma - Continue to monitor CBC daily  Pressure injury of the skin, sacrum, right heel and left pretibial wound  - Appreciate wound care assessment - Right heel unstageable pressure injury - Right heel: 1.5cm x 1.5cmx 0cm - Trauma wound left pretibial wound - Left pretibial: 10cm x 0.5cm x 0cm - Dressing procedure/placement/frequency: Prevalon boots in place already for offloading heels. Silicone foam to the  right heel and sacrum  Adult failure to thrive / severe protein calorie malnutrition - Evaluated by swallow evaluation team, determined to be at high risk of aspiration but comfort feeding allowed    DVT prophylaxis: SCD's bilaterally  Code Status: DNR/DNI Family Communication: daughter at the bedside this  am Disposition Plan: taper slowly IV steroids, discharge once temp stable for at least 24-48 hours   Consultants:   Palliative care   Neurology  SLP - severe aspiration risk as of 2/11; comfort feeds only   WOC  Procedures:   None   Antimicrobials:   Rocephin 03/12/2016   Cefepime 03/12/2016 --> 03/15/2016   Vanco 03/14/2016 --> 03/15/2016  Meropenem 03/15/2016 -->   Subjective: No overnight events.  Objective: Vitals:   03/16/16 2000 03/16/16 2331 03/17/16 0300 03/17/16 1100  BP: (!) 103/91 127/73 126/60   Pulse:      Resp: 13 17 18    Temp: 97.6 F (36.4 C) 97.4 F (36.3 C) 97.2 F (36.2 C) 97.7 F (36.5 C)  TempSrc: Oral Axillary Oral Oral  SpO2: 99% 100% 98%   Weight:      Height:        Intake/Output Summary (Last 24 hours) at 03/17/16 1255 Last data filed at 03/17/16 1100  Gross per 24 hour  Intake             2650 ml  Output             1650 ml  Net             1000 ml   Filed Weights   03/12/16 2031 03/16/16 0406  Weight: 64.8 kg (142 lb 13.7 oz) 65.6 kg (144 lb 10 oz)    Examination:  General exam: No distress Respiratory system: No wheezing, no rhonchi  Cardiovascular system: S1 & S2 heard, Rate controlled Gastrointestinal system: (+) BS, non tender  Central nervous system: more alert, still not oriented  Extremities: palpable pulses, no tenderness  Skin: has few skin tears on arms, skin lesions on anterior shin Psychiatry: normal mood, not restless   Data Reviewed: I have personally reviewed following labs and imaging studies  CBC:  Recent Labs Lab 03/12/16 0840 03/12/16 1718 03/14/16 0354 03/15/16 0223 03/16/16 1053 03/17/16 0845  WBC 5.5 3.2* 2.9* 3.8* 3.8* 3.7*  NEUTROABS 4.6 2.5  --   --   --   --   HGB 11.0* 8.9* 8.5* 8.9* 9.4* 10.1*  HCT 31.7* 25.2* 25.5* 26.2* 27.7* 30.2*  MCV 85.9 85.4 88.2 87.3 87.1 88.3  PLT 100* 84* 74* 78* 76* 72*   Basic Metabolic Panel:  Recent Labs Lab 03/12/16 0840 03/12/16 1718  03/13/16 0916 03/14/16 0354 03/15/16 0223  NA 135 138  --  137 132*  K 3.0* 2.9*  --  4.5 4.5  CL 106 111  --  115* 107  CO2 22 20*  --  16* 17*  GLUCOSE 116* 56*  --  124* 262*  BUN 23* 23*  --  13 12  CREATININE 1.30* 1.10  --  1.25* 1.35*  CALCIUM 8.3* 7.6*  --  7.5* 8.2*  MG  --   --  1.4*  --  1.5*   GFR: Estimated Creatinine Clearance: 37.1 mL/min (by C-G formula based on SCr of 1.35 mg/dL (H)). Liver Function Tests:  Recent Labs Lab 03/12/16 0840 03/12/16 1718 03/15/16 0223  AST 36 24 22  ALT 16* 16* 17  ALKPHOS 104 83 84  BILITOT 0.5 0.4 0.9  PROT 6.9 5.6* 5.6*  ALBUMIN 3.2* 2.5* 2.3*   No results for input(s): LIPASE, AMYLASE in the last 168 hours. No results for input(s): AMMONIA in the last 168 hours. Coagulation Profile:  Recent Labs Lab 03/12/16 0840  INR 1.01   Cardiac Enzymes: No results for input(s): CKTOTAL, CKMB, CKMBINDEX, TROPONINI in the last 168 hours. BNP (last 3 results) No results for input(s): PROBNP in the last 8760 hours. HbA1C: No results for input(s): HGBA1C in the last 72 hours. CBG:  Recent Labs Lab 03/16/16 2014 03/16/16 2336 03/17/16 0259 03/17/16 0803 03/17/16 1151  GLUCAP 183* 184* 148* 194* 238*   Lipid Profile: No results for input(s): CHOL, HDL, LDLCALC, TRIG, CHOLHDL, LDLDIRECT in the last 72 hours. Thyroid Function Tests: No results for input(s): TSH, T4TOTAL, FREET4, T3FREE, THYROIDAB in the last 72 hours. Anemia Panel: No results for input(s): VITAMINB12, FOLATE, FERRITIN, TIBC, IRON, RETICCTPCT in the last 72 hours. Urine analysis:    Component Value Date/Time   COLORURINE AMBER (A) 03/12/2016 1300   APPEARANCEUR TURBID (A) 03/12/2016 1300   LABSPEC 1.010 03/12/2016 1300   PHURINE 7.0 03/12/2016 1300   GLUCOSEU NEGATIVE 03/12/2016 1300   HGBUR MODERATE (A) 03/12/2016 1300   BILIRUBINUR NEGATIVE 03/12/2016 1300   BILIRUBINUR NEG 11/11/2014 1517   KETONESUR NEGATIVE 03/12/2016 1300   PROTEINUR 30 (A)  03/12/2016 1300   UROBILINOGEN 0.2 11/11/2014 1517   UROBILINOGEN 1.0 10/31/2014 2218   NITRITE POSITIVE (A) 03/12/2016 1300   LEUKOCYTESUR LARGE (A) 03/12/2016 1300   Sepsis Labs: @LABRCNTIP (procalcitonin:4,lacticidven:4)  Culture, blood (Routine x 2)     Status: None (Preliminary result)   Collection Time: 03/12/16  8:30 AM  Result Value Ref Range Status   Specimen Description BLOOD RIGHT ANTECUBITAL  Final   Culture   Final    NO GROWTH 3 DAYS    Report Status PENDING  Incomplete  Culture, blood (Routine x 2)     Status: Abnormal   Collection Time: 03/12/16 10:30 AM  Result Value Ref Range Status   Specimen Description BLOOD RIGHT HAND  Final   Special Requests IN PEDIATRIC BOTTLE 4ML  Final   Culture  Setup Time   Final   Culture (A)  Final    AEROCOCCUS VIRIDANS    Report Status 03/14/2016 FINAL  Final  Blood Culture ID Panel (Reflexed)     Status: None   Collection Time: 03/12/16 10:30 AM  Result Value Ref Range Status   Enterococcus species NOT DETECTED NOT DETECTED Final   Listeria monocytogenes NOT DETECTED NOT DETECTED Final   Staphylococcus species NOT DETECTED NOT DETECTED Final   Staphylococcus aureus NOT DETECTED NOT DETECTED Final   Streptococcus species NOT DETECTED NOT DETECTED Final   Streptococcus agalactiae NOT DETECTED NOT DETECTED Final   Streptococcus pneumoniae NOT DETECTED NOT DETECTED Final   Streptococcus pyogenes NOT DETECTED NOT DETECTED Final   Acinetobacter baumannii NOT DETECTED NOT DETECTED Final   Enterobacteriaceae species NOT DETECTED NOT DETECTED Final   Enterobacter cloacae complex NOT DETECTED NOT DETECTED Final   Escherichia coli NOT DETECTED NOT DETECTED Final   Klebsiella oxytoca NOT DETECTED NOT DETECTED Final   Klebsiella pneumoniae NOT DETECTED NOT DETECTED Final   Proteus species NOT DETECTED NOT DETECTED Final   Serratia marcescens NOT DETECTED NOT DETECTED Final   Haemophilus influenzae NOT DETECTED NOT DETECTED Final    Neisseria meningitidis NOT DETECTED NOT DETECTED Final   Pseudomonas aeruginosa NOT DETECTED NOT DETECTED Final   Candida albicans NOT DETECTED NOT  DETECTED Final   Candida glabrata NOT DETECTED NOT DETECTED Final   Candida krusei NOT DETECTED NOT DETECTED Final   Candida parapsilosis NOT DETECTED NOT DETECTED Final   Candida tropicalis NOT DETECTED NOT DETECTED Final    Comment: Performed at [facility], [place] [address], [place], [place] [address]  Urine culture     Status: Abnormal   Collection Time: 03/12/16  1:00 PM  Result Value Ref Range Status   Specimen Description URINE, CATHETERIZED  Final   Special Requests NONE  Final   Culture (A)  Final    >=100,000 COLONIES/mL KLEBSIELLA PNEUMONIAE    Report Status 03/15/2016 FINAL  Final      Susceptibility   Klebsiella pneumoniae - MIC*    AMPICILLIN >=32 RESISTANT Resistant     CEFAZOLIN >=64 RESISTANT Resistant     CEFTRIAXONE >=64 RESISTANT Resistant     CIPROFLOXACIN >=4 RESISTANT Resistant     GENTAMICIN >=16 RESISTANT Resistant     IMIPENEM <=0.25 SENSITIVE Sensitive     NITROFURANTOIN 32 SENSITIVE Sensitive     TRIMETH/SULFA <=20 SENSITIVE Sensitive     AMPICILLIN/SULBACTAM >=32 RESISTANT Resistant     PIP/TAZO 32 INTERMEDIATE Intermediate     Extended ESBL POSITIVE Resistant     * >=100,000 COLONIES/mL KLEBSIELLA PNEUMONIAE  MRSA PCR Screening     Status: None   Collection Time: 03/12/16  8:37 PM  Result Value Ref Range Status   MRSA by PCR NEGATIVE NEGATIVE Final      Radiology Studies: Ct Head Wo Contrast Result Date: 03/14/2016 Very slight additional bleeding into the left subdural hematoma with slight increased thickness anteriorly, from 13 mm to 15 mm. Stable posteriorly. No measurable increased mass effect. No shift. Electronically Signed   By: Nelson Chimes M.D.   On: 03/14/2016 13:54   Ct Head Wo Contrast Result Date: 03/13/2016 1. No significant interval change in the acute on chronic left hemispheric  subdural hematoma compared with 03/12/2016, measuring 17 mm in maximum thickness compared with 17 mm on 03/12/2016. 2. Mild to moderate periventricular small vessel ischemic changes. Electronically Signed   By: Donavan Foil M.D.   On: 03/13/2016 01:53   Ct Head Wo Contrast Result Date: 03/12/2016 Mixed density left subdural hematoma, enlarged since prior study compatible with acute on chronic subdural hematoma. No significant midline shift. Chronic microvascular disease. No acute findings in the cervical spine. Critical Value/emergent results were called by telephone at the time of interpretation on 03/12/2016 at 11:58 am to Dr. Daleen Bo , who verbally acknowledged these results. Electronically Signed   By: Rolm Baptise M.D.   On: 03/12/2016 11:59   Ct Cervical Spine Wo Contrast Result Date: 03/12/2016 Mixed density left subdural hematoma, enlarged since prior study compatible with acute on chronic subdural hematoma. No significant midline shift. Chronic microvascular disease. No acute findings in the cervical spine. Critical Value/emergent results were called by telephone at the time of interpretation on 03/12/2016 at 11:58 am to Dr. Daleen Bo , who verbally acknowledged these results. Electronically Signed   By: Rolm Baptise M.D.   On: 03/12/2016 11:59     Scheduled Meds: . hydrocortisone sod succinate (SOLU-CORTEF) inj  25 mg Intravenous Q8H  . levETIRAcetam  500 mg Intravenous Q12H  . mouth rinse  15 mL Mouth Rinse BID  . meropenem (MERREM) IV  1 g Intravenous Q12H  . sodium chloride flush  3 mL Intravenous Q12H   Continuous Infusions: . sodium chloride 75 mL/hr at  03/17/16 0639     LOS: 5 days    Time spent: 25 minutes  Greater than 50% of the time spent on counseling and coordinating the care.   Leisa Lenz, MD [facility] Pager [phone]  If 7PM-7AM, please contact night-coverage [email or web address] Password [facility] 03/17/2016, 12:55 PM

## 2016-03-18 DIAGNOSIS — E876 Hypokalemia: Secondary | ICD-10-CM

## 2016-03-18 LAB — GLUCOSE, CAPILLARY
GLUCOSE-CAPILLARY: 226 mg/dL — AB (ref 65–99)
Glucose-Capillary: 199 mg/dL — ABNORMAL HIGH (ref 65–99)
Glucose-Capillary: 211 mg/dL — ABNORMAL HIGH (ref 65–99)
Glucose-Capillary: 215 mg/dL — ABNORMAL HIGH (ref 65–99)
Glucose-Capillary: 225 mg/dL — ABNORMAL HIGH (ref 65–99)
Glucose-Capillary: 241 mg/dL — ABNORMAL HIGH (ref 65–99)

## 2016-03-18 LAB — BASIC METABOLIC PANEL
Anion gap: 6 (ref 5–15)
BUN: 17 mg/dL (ref 6–20)
CHLORIDE: 112 mmol/L — AB (ref 101–111)
CO2: 22 mmol/L (ref 22–32)
Calcium: 7.9 mg/dL — ABNORMAL LOW (ref 8.9–10.3)
Creatinine, Ser: 1.08 mg/dL (ref 0.61–1.24)
GFR calc Af Amer: >60 mL/min (ref >60)
GFR calc non Af Amer: >60 mL/min (ref >60)
GLUCOSE: 231 mg/dL — AB (ref 65–99)
POTASSIUM: 3.2 mmol/L — AB (ref 3.5–5.1)
Sodium: 140 mmol/L (ref 135–145)

## 2016-03-18 LAB — CBC
HEMATOCRIT: 24.9 % — AB (ref 39.0–52.0)
Hemoglobin: 8.4 g/dL — ABNORMAL LOW (ref 13.0–17.0)
MCH: 29.7 pg (ref 26.0–34.0)
MCHC: 33.7 g/dL (ref 30.0–36.0)
MCV: 88 fL (ref 78.0–100.0)
Platelets: 72 10*3/uL — ABNORMAL LOW (ref 150–400)
RBC: 2.83 MIL/uL — ABNORMAL LOW (ref 4.22–5.81)
RDW: 15.3 % (ref 11.5–15.5)
WBC: 3.7 10*3/uL — ABNORMAL LOW (ref 4.0–10.5)

## 2016-03-18 LAB — MAGNESIUM: Magnesium: 1.5 mg/dL — ABNORMAL LOW (ref 1.7–2.4)

## 2016-03-18 MED ORDER — INSULIN ASPART 100 UNIT/ML ~~LOC~~ SOLN
0.0000 [IU] | Freq: Three times a day (TID) | SUBCUTANEOUS | Status: DC
  Administered 2016-03-19 – 2016-03-20 (×4): 3 [IU] via SUBCUTANEOUS
  Administered 2016-03-20: 5 [IU] via SUBCUTANEOUS

## 2016-03-18 MED ORDER — MAGNESIUM SULFATE 2 GM/50ML IV SOLN
2.0000 g | Freq: Once | INTRAVENOUS | Status: AC
Start: 2016-03-18 — End: 2016-03-18
  Administered 2016-03-18: 2 g via INTRAVENOUS
  Filled 2016-03-18: qty 50

## 2016-03-18 MED ORDER — INSULIN ASPART 100 UNIT/ML ~~LOC~~ SOLN: 5.0000 [IU] | Freq: Once | SUBCUTANEOUS | Status: DC

## 2016-03-18 NOTE — Care Management Important Message (Signed)
Important Message  Patient Details  Name: Dakota White MRN: [id number] Date of Birth: 1928-07-08   Medicare Important Message Given:       Magdalene RiverMayo, Lexey Fletes T, RN 03/18/2016, 3:41 PM

## 2016-03-18 NOTE — Care Management Note (Signed)
Case Management Note  Patient Details  Name: Dakota LambHowell W White MRN: [id number] Date of Birth: 01-09-1929  Subjective/Objective:    Pt to discharge to SNF for rehab, CSW following.                       Expected Discharge Plan:  Skilled Nursing Facility  In-House Referral:  Clinical Social Work  Discharge planning Services  CM Consult  Status of Service:  Completed, signed off  Magdalene RiverMayo, Cristiano Capri T, [place]RN 03/18/2016, 3:45 PM

## 2016-03-18 NOTE — Progress Notes (Signed)
Pharmacy Antibiotic Note  Dakota White is a 83 y.o. male admitted on 03/12/2016 with ESBL Kleb Pneumo UTI.  Pharmacy has been consulted for meropenem dosing.  Day #4 of meropenem for ESBL Kleb Pneumo UTI. Afebrile, WBC low at 3.7.  Plan: Continue meropenem 1g IV Q12  Monitor clinical picture, renal function F/U LOT (Consider 5 days?)   Height: 6' (182.9 cm) Weight: 144 lb 10 oz (65.6 kg) IBW/kg (Calculated) : 77.6  Temp (24hrs), Avg:97.7 F (36.5 C), Min:96.2 F (35.7 C), Max:98.5 F (36.9 C)   Recent Labs Lab 03/12/16 0840 03/12/16 0842 03/12/16 1242 03/12/16 1718 03/14/16 0354 03/15/16 0223 03/16/16 1053 03/17/16 0845 03/18/16 0249  WBC 5.5  --   --  3.2* 2.9* 3.8* 3.8* 3.7* 3.7*  CREATININE 1.30*  --   --  1.10 1.25* 1.35*  --   --  1.08  LATICACIDVEN  --  1.29 0.43*  --   --   --   --   --   --     Estimated Creatinine Clearance: 46.4 mL/min (by C-G formula based on SCr of 1.08 mg/dL).    Allergies  Allergen Reactions  . Actos [Pioglitazone] Other (See Comments)    "makes me feel lousy and makes me bleed"  . Ramipril Other (See Comments)    "pt not aware of allergy"    Antimicrobials this admission: Cefepime 2/9>>2/12 Vanc 2/11 >> 2/12 Meropenem 2/12 >>  Dose adjustments this admission: n/a  Microbiology results: 2/9BCx: 1/2 aerococcus viridans 2/9UCx: ESBL Kleb Pneumo (S to meropenem and Bactrim) 2/9 MRSA pcr: negative  Thank you for allowing pharmacy to be a part of this patient's care.  Enzo BiNathan Tristyn Pharris, PharmD, BCPS Clinical Pharmacist Pager [phone] 03/18/2016 9:01 AM

## 2016-03-18 NOTE — Progress Notes (Signed)
Occupational Therapy Treatment Patient Details Name: Dakota White W Koslowski MRN: [id number] DOB: 1928/11/21 Today's Date: 03/18/2016    History of present illness 87-yo male with a pmh significant for dementia, history of fall with hipsx in 2017. Patient has underlying history of recurrent urinary tract infections, falls, history of chronic subdural brain bleeds. [facility] with new onset confusion, continued weakness and unwitnessed fall.   OT comments  Pt initially agreeable to sit at EOB and eat, but upon assisting pt with bed mobility, pt becoming agitated and actively resistant. Returned pt to supine. Pt self feeding at bed level with encouragement. Will continue to follow.  Follow Up Recommendations  SNF;Supervision/Assistance - 24 hour    Equipment Recommendations       Recommendations for Other Services      Precautions / Restrictions Precautions Precautions: Fall Restrictions Weight Bearing Restrictions: No       Mobility Bed Mobility Overal bed mobility: Needs Assistance Bed Mobility: Supine to Sit;Sit to Supine     Supine to sit: Total assist;+2 for physical assistance Sit to supine: Total assist;+2 for physical assistance   General bed mobility comments: pt initially agreeable to sit EOB to eat breakfast with assist from therapists. However, became very agitated throughout and actively resisting. Total A x2 with all aspects  Transfers                      Balance Overall balance assessment: Needs assistance;History of Falls Sitting-balance support: Bilateral upper extremity supported;Feet supported Sitting balance-Leahy Scale: Poor Sitting balance - Comments: pt agitated and actively resisting transition into sitting Postural control: Posterior lean                         ADL Overall ADL's : Needs assistance/impaired Eating/Feeding: Bed level;Minimal assistance Eating/Feeding Details (indicate cue type and reason): per daughter, pt has  been self feeding Grooming: Wash/dry hands;Bed level;Total assistance                                        Vision                     Perception     Praxis      Cognition   Behavior During Therapy: Flat affect;Agitated Overall Cognitive Status: History of cognitive impairments - at baseline                       Extremity/Trunk Assessment               Exercises     Shoulder Instructions       General Comments      Pertinent Vitals/ Pain       Pain Assessment: Faces Faces Pain Scale: Hurts little more Pain Location: R hip Pain Descriptors / Indicators: Discomfort;Grimacing;Guarding Pain Intervention(s): Monitored during session;Limited activity within patient's tolerance;Repositioned  Home Living                                          Prior Functioning/Environment              Frequency  Min 2X/week        Progress Toward Goals  OT Goals(current goals can now be found in  the care plan section)  Progress towards OT goals: Not progressing toward goals - comment (agitation)  Acute Rehab OT Goals Patient Stated Goal: none stated Potential to Achieve Goals: Fair  Plan Discharge plan remains appropriate    Co-evaluation    PT/OT/SLP Co-Evaluation/Treatment: Yes Reason for Co-Treatment: Necessary to address cognition/behavior during functional activity;For patient/therapist safety PT goals addressed during session: Mobility/safety with mobility;Balance OT goals addressed during session: Strengthening/ROM      End of Session     Activity Tolerance Treatment limited secondary to agitation   Patient Left in bed;with call bell/phone within reach;with bed alarm set;with family/visitor present   Nurse Communication  (agitation, prevalon boots left off)        Time: 1610-9604 OT Time Calculation (min): 20 min  Charges: OT General Charges $OT Visit: 1 Procedure OT  Treatments $Therapeutic Activity: 8-22 mins  Evern Bio 03/18/2016, 9:13 AM  [phone]

## 2016-03-18 NOTE — Progress Notes (Signed)
Patient ID: Dakota White, male   DOB: [birth year], 83 y.o.   MRN: [id number]  PROGRESS NOTE    Dakota White  HQI:[id number] DOB: 05-19-1928 DOA: 03/12/2016  PCP: Georges Lynch, MD   Brief Narrative:   83 y.o.male with past medical history significant for chronic diastolic CHF, ejection fraction 35-40% in September 2016, symptomatic bradycardia status post pacemaker placement, hypertension, dementia, diabetes mellitus, chronic kidney disease stage III with baseline creatinine 1.53. He was hospitalized in July 2017 for right hip fracture and underwent right hip hemiarthroplasty. He was discharged to skilled nursing facility but unable to have aspirin on Lovenox because of concomitant subdural hematoma.  Patient presented 03/12/2016 status post fall. Speaking with his daughter at the bedside 03/16/2016, apparently patient's son was taking care of him and stopped all of his medications for past 3 months, has not really taken the patient to see a doctor, there was even a question of if patient was fed at all because when a home health nurse came to home she reported there was no food for patient to eat. On this admission, patient was obtunded, CT head showed acute on chronic subdural hemorrhage and per further talk with family, they declined craniotomy. Palliative was consulted for goals of care. Patient's daughter would like to take patient to [place] and have him in skilled nursing facility tear where she could visit he more often and provide care that he needs.  Assessment & Plan:  Subdural hematoma / Mechanical fall  - Acute on chronic subdural hematoma with no midline shift.  - Case discussed with neurosurgery and neurology , transferred to [facility].  - Patient with clonus seen by EDP thought to be a seizure. Was loaded with Keppra and given Ativan.  - Continue Keppra 500 mg IV Q 12 hours  - EEG - suggestive of generalized cerebral dysfunction. - Repeat CT scan showed slight increase  in size of subdural hematoma since admission CT scan  - Patient has a pacemaker so unable to perform MRI to rule out CVA - Family declined craniotomy   Hypoglycemia / diabetes mellitus2 without complications with long term insulin use - Patient has recurrent hypoglycemia likely due to sepsis - Due to episodes of hypoglycemia he is not on insulin regimen  - Continue stress dose steroids    Sepsis secondary to ESBL UTI / Aerococcus viridans bacteremia / Hypothermia - Has met sepsis criteria and based on urine culture and blood culture looks like ESBL UTI as well as Aerococcus viridans bacteremia - Continue meropenem - Continue stress dose steroids   Severe Dementia without behavioral disturbance - Plan for discharge to skilled nursing facility once medically stable however still deciding whether this would be here in [place] or [place] area  Hypokalemia / hypomagnesemia - Due to sepsis - Supplemented potassium and magnesium this am - Follow-up BMP and magnesium level tomorrow morning  Thrombocytopenia - Chronic, likely in the setting of subdural hematoma - Stable overall   Pressure injury of the skin, sacrum, right heel and left pretibial wound  - Appreciate wound care assessment - Right heel unstageable pressure injury - Right heel: 1.5cm x 1.5cmx 0cm - Trauma wound left pretibial wound - Left pretibial: 10cm x 0.5cm x 0cm - Dressing procedure/placement/frequency: Prevalon boots in place already for offloading heels. Silicone foam to the right heel and sacrum  Adult failure to thrive / severe protein calorie malnutrition - Evaluated by swallow evaluation team, determined to be at high risk of  aspiration but comfort feeding allowed    DVT prophylaxis: SCD's bilaterally  Code Status: DNR/DNI Family Communication: updating pt daughter daily  Disposition Plan:  discharge once temp stable for at least 24-48 hours without bear hugger   Consultants:   Palliative  care   Neurology  SLP - severe aspiration risk as of 2/11; comfort feeds only   WOC  Procedures:   None   Antimicrobials:   Rocephin 03/12/2016   Cefepime 03/12/2016 --> 03/15/2016   Vanco 03/14/2016 --> 03/15/2016  Meropenem 03/15/2016 -->   Subjective: No overnight events. Was hypothermic in past 24 hours.   Objective: Vitals:   03/18/16 0000 03/18/16 0318 03/18/16 0500 03/18/16 0826  BP: 108/72 (!) 142/61 (!) 153/65 (!) 162/78  Pulse: 97  75 73  Resp: (!) _0 Temp: 98.5 F (36.9 C) 98.4 F (36.9 C)  97.5 F (36.4 C)  TempSrc: Oral Oral  Axillary  SpO2: 99%  100% 100%  Weight:      Height:        Intake/Output Summary (Last 24 hours) at 03/18/16 1009 Last data filed at 03/18/16 0935  Gross per 24 hour  Intake              393 ml  Output             1500 ml  Net            -1107 ml   Filed Weights   03/12/16 2031 03/16/16 0406  Weight: 64.8 kg (142 lb 13.7 oz) 65.6 kg (144 lb 10 oz)    Examination:  General exam: No distress, sleeping this am Respiratory system: No wheezing, no rhonchi  Cardiovascular system: S1 & S2 heard, RRR Gastrointestinal system: (+) BS, non tender, no distended  Central nervous system: sleeping this am Extremities: palpable pulses, no tenderness  Skin: has few skin tears on arms, skin lesions on anterior shin Psychiatry: normal behavior, not agitated   Data Reviewed: I have personally reviewed following labs and imaging studies  CBC:  Recent Labs Lab 03/12/16 0840 03/12/16 1718 03/14/16 0354 03/15/16 0223 03/16/16 1053 03/17/16 0845 03/18/16 0249  WBC 5.5 3.2* 2.9* 3.8* 3.8* 3.7* 3.7*  NEUTROABS 4.6 2.5  --   --   --   --   --   HGB 11.0* 8.9* 8.5* 8.9* 9.4* 10.1* 8.4*  HCT 31.7* 25.2* 25.5* 26.2* 27.7* 30.2* 24.9*  MCV 85.9 85.4 88.2 87.3 87.1 88.3 88.0  PLT 100* 84* 74* 78* 76* 72* 72*   Basic Metabolic Panel:  Recent Labs Lab 03/12/16 0840 03/12/16 1718 03/13/16 0916 03/14/16 0354 03/15/16 0223  03/18/16 0249  NA 135 138  --  137 132* 140  K 3.0* 2.9*  --  4.5 4.5 3.2*  CL 106 111  --  115* 107 112*  CO2 22 20*  --  16* 17* 22  GLUCOSE 116* 56*  --  124* 262* 231*  BUN 23* 23*  --  _1 CREATININE 1.30* 1.10  --  1.25* 1.35* 1.08  CALCIUM 8.3* 7.6*  --  7.5* 8.2* 7.9*  MG  --   --  1.4*  --  1.5* 1.5*   GFR: Estimated Creatinine Clearance: 46.4 mL/min (by C-G formula based on SCr of 1.08 mg/dL). Liver Function Tests:  Recent Labs Lab 03/12/16 0840 03/12/16 1718 03/15/16 0223  AST 36 24 22  ALT 16* 16* 17  ALKPHOS 104 83 84  BILITOT 0.5 0.4 0.9  PROT 6.9 5.6* 5.6*  ALBUMIN 3.2* 2.5* 2.3*   No results for input(s): LIPASE, AMYLASE in the last 168 hours. No results for input(s): AMMONIA in the last 168 hours. Coagulation Profile:  Recent Labs Lab 03/12/16 0840  INR 1.01   Cardiac Enzymes: No results for input(s): CKTOTAL, CKMB, CKMBINDEX, TROPONINI in the last 168 hours. BNP (last 3 results) No results for input(s): PROBNP in the last 8760 hours. HbA1C: No results for input(s): HGBA1C in the last 72 hours. CBG:  Recent Labs Lab 03/17/16 1720 03/17/16 2131 03/18/16 0007 03/18/16 0316 03/18/16 0823  GLUCAP 214* 200* 211* 225* 199*   Lipid Profile: No results for input(s): CHOL, HDL, LDLCALC, TRIG, CHOLHDL, LDLDIRECT in the last 72 hours. Thyroid Function Tests: No results for input(s): TSH, T4TOTAL, FREET4, T3FREE, THYROIDAB in the last 72 hours. Anemia Panel: No results for input(s): VITAMINB12, FOLATE, FERRITIN, TIBC, IRON, RETICCTPCT in the last 72 hours. Urine analysis:    Component Value Date/Time   COLORURINE AMBER (A) 03/12/2016 1300   APPEARANCEUR TURBID (A) 03/12/2016 1300   LABSPEC 1.010 03/12/2016 1300   PHURINE 7.0 03/12/2016 1300   GLUCOSEU NEGATIVE 03/12/2016 1300   HGBUR MODERATE (A) 03/12/2016 1300   BILIRUBINUR NEGATIVE 03/12/2016 1300   BILIRUBINUR NEG 11/11/2014 1517   KETONESUR NEGATIVE 03/12/2016 1300   PROTEINUR 30  (A) 03/12/2016 1300   UROBILINOGEN 0.2 11/11/2014 1517   UROBILINOGEN 1.0 10/31/2014 2218   NITRITE POSITIVE (A) 03/12/2016 1300   LEUKOCYTESUR LARGE (A) 03/12/2016 1300   Sepsis Labs: _0 (procalcitonin:4,lacticidven:4)  Culture, blood (Routine x 2)     Status: None (Preliminary result)   Collection Time: 03/12/16  8:30 AM  Result Value Ref Range Status   Specimen Description BLOOD RIGHT ANTECUBITAL  Final   Culture   Final    NO GROWTH 3 DAYS    Report Status PENDING  Incomplete  Culture, blood (Routine x 2)     Status: Abnormal   Collection Time: 03/12/16 10:30 AM  Result Value Ref Range Status   Specimen Description BLOOD RIGHT HAND  Final   Special Requests IN PEDIATRIC BOTTLE 4ML  Final   Culture  Setup Time   Final   Culture (A)  Final    AEROCOCCUS VIRIDANS    Report Status 03/14/2016 FINAL  Final  Blood Culture ID Panel (Reflexed)     Status: None   Collection Time: 03/12/16 10:30 AM  Result Value Ref Range Status   Enterococcus species NOT DETECTED NOT DETECTED Final   Listeria monocytogenes NOT DETECTED NOT DETECTED Final   Staphylococcus species NOT DETECTED NOT DETECTED Final   Staphylococcus aureus NOT DETECTED NOT DETECTED Final   Streptococcus species NOT DETECTED NOT DETECTED Final   Streptococcus agalactiae NOT DETECTED NOT DETECTED Final   Streptococcus pneumoniae NOT DETECTED NOT DETECTED Final   Streptococcus pyogenes NOT DETECTED NOT DETECTED Final   Acinetobacter baumannii NOT DETECTED NOT DETECTED Final   Enterobacteriaceae species NOT DETECTED NOT DETECTED Final   Enterobacter cloacae complex NOT DETECTED NOT DETECTED Final   Escherichia coli NOT DETECTED NOT DETECTED Final   Klebsiella oxytoca NOT DETECTED NOT DETECTED Final   Klebsiella pneumoniae NOT DETECTED NOT DETECTED Final   Proteus species NOT DETECTED NOT DETECTED Final   Serratia marcescens NOT DETECTED NOT DETECTED Final   Haemophilus influenzae NOT DETECTED NOT DETECTED Final     Neisseria meningitidis NOT DETECTED NOT DETECTED Final   Pseudomonas aeruginosa NOT DETECTED NOT DETECTED Final   Candida albicans NOT DETECTED  NOT DETECTED Final   Candida glabrata NOT DETECTED NOT DETECTED Final   Candida krusei NOT DETECTED NOT DETECTED Final   Candida parapsilosis NOT DETECTED NOT DETECTED Final   Candida tropicalis NOT DETECTED NOT DETECTED Final    Comment: Performed at [facility], [place] [address], [place], [place] [address]  Urine culture     Status: Abnormal   Collection Time: 03/12/16  1:00 PM  Result Value Ref Range Status   Specimen Description URINE, CATHETERIZED  Final   Special Requests NONE  Final   Culture (A)  Final    >=100,000 COLONIES/mL KLEBSIELLA PNEUMONIAE    Report Status 03/15/2016 FINAL  Final      Susceptibility   Klebsiella pneumoniae - MIC*    AMPICILLIN >=32 RESISTANT Resistant     CEFAZOLIN >=64 RESISTANT Resistant     CEFTRIAXONE >=64 RESISTANT Resistant     CIPROFLOXACIN >=4 RESISTANT Resistant     GENTAMICIN >=16 RESISTANT Resistant     IMIPENEM <=0.25 SENSITIVE Sensitive     NITROFURANTOIN 32 SENSITIVE Sensitive     TRIMETH/SULFA <=20 SENSITIVE Sensitive     AMPICILLIN/SULBACTAM >=32 RESISTANT Resistant     PIP/TAZO 32 INTERMEDIATE Intermediate     Extended ESBL POSITIVE Resistant     * >=100,000 COLONIES/mL KLEBSIELLA PNEUMONIAE  MRSA PCR Screening     Status: None   Collection Time: 03/12/16  8:37 PM  Result Value Ref Range Status   MRSA by PCR NEGATIVE NEGATIVE Final      Radiology Studies: Ct Head Wo Contrast Result Date: 03/14/2016 Very slight additional bleeding into the left subdural hematoma with slight increased thickness anteriorly, from 13 mm to 15 mm. Stable posteriorly. No measurable increased mass effect. No shift. Electronically Signed   By: Nelson Chimes M.D.   On: 03/14/2016 13:54   Ct Head Wo Contrast Result Date: 03/13/2016 1. No significant interval change in the acute on chronic left  hemispheric subdural hematoma compared with 03/12/2016, measuring 17 mm in maximum thickness compared with 17 mm on 03/12/2016. 2. Mild to moderate periventricular small vessel ischemic changes. Electronically Signed   By: Donavan Foil M.D.   On: 03/13/2016 01:53   Ct Head Wo Contrast Result Date: 03/12/2016 Mixed density left subdural hematoma, enlarged since prior study compatible with acute on chronic subdural hematoma. No significant midline shift. Chronic microvascular disease. No acute findings in the cervical spine. Critical Value/emergent results were called by telephone at the time of interpretation on 03/12/2016 at 11:58 am to Dr. Daleen Bo , who verbally acknowledged these results. Electronically Signed   By: Rolm Baptise M.D.   On: 03/12/2016 11:59   Ct Cervical Spine Wo Contrast Result Date: 03/12/2016 Mixed density left subdural hematoma, enlarged since prior study compatible with acute on chronic subdural hematoma. No significant midline shift. Chronic microvascular disease. No acute findings in the cervical spine. Critical Value/emergent results were called by telephone at the time of interpretation on 03/12/2016 at 11:58 am to Dr. Daleen Bo , who verbally acknowledged these results. Electronically Signed   By: Rolm Baptise M.D.   On: 03/12/2016 11:59     Scheduled Meds: . hydrocortisone sod succinate (SOLU-CORTEF) inj  25 mg Intravenous Q12H  . levETIRAcetam  500 mg Intravenous Q12H  . mouth rinse  15 mL Mouth Rinse BID  . meropenem (MERREM) IV  1 g Intravenous Q12H  . sodium chloride flush  3 mL Intravenous Q12H   Continuous Infusions: . sodium chloride 75 mL/hr  at 03/17/16 0639     LOS: 6 days    Time spent: 25 minutes  Greater than 50% of the time spent on counseling and coordinating the care.   Leisa Lenz, MD [facility] Pager [phone]  If 7PM-7AM, please contact night-coverage [email or web address] Password [email or web address] 03/18/2016, 10:09 AM

## 2016-03-18 NOTE — Progress Notes (Signed)
Physical Therapy Treatment Patient Details Name: AWAIS COBARRUBIAS MRN: [id number] DOB: December 10, 1928 Today's Date: 03/18/2016    History of Present Illness 87-yo male with a pmh significant for dementia, history of fall with hipsx in 2017. Patient has underlying history of recurrent urinary tract infections, falls, history of chronic subdural brain bleeds. Presents to [facility] with new onset confusion, continued weakness and unwitnessed fall.    PT Comments    Pt presented supine in bed with daughter present. Pt initially agreeable to sit EOB with therapists to eat breakfast but as therapists were assisting with transition from supine to sit, pt became very agitated and actively resisting movement. Pt was then repositioned in bed and into chair position to eat breakfast with daughter still present. All VSS throughout. PT will continue to follow acutely.    Follow Up Recommendations  Supervision/Assistance - 24 hour (Hospice vs SNF?)     Equipment Recommendations  Hospital bed [facility])    Recommendations for Other Services       Precautions / Restrictions Precautions Precautions: Fall Restrictions Weight Bearing Restrictions: No    Mobility  Bed Mobility Overal bed mobility: Needs Assistance Bed Mobility: Supine to Sit;Sit to Supine     Supine to sit: Total assist;+2 for physical assistance Sit to supine: Total assist;+2 for physical assistance   General bed mobility comments: pt initially agreeable to sit EOB to eat breakfast with assist from therapists. However, became very agitated throughout and actively resisting. Total A x2 with all aspects  Transfers                    Ambulation/Gait                 Stairs            Wheelchair Mobility    Modified Rankin (Stroke Patients Only)       Balance Overall balance assessment: Needs assistance;History of Falls   Sitting balance-Leahy Scale: Poor Sitting balance - Comments: pt agitated and  actively resisting transition into sitting Postural control: Posterior lean                          Cognition Arousal/Alertness: Awake/alert Behavior During Therapy: Flat affect;Agitated Overall Cognitive Status: History of cognitive impairments - at baseline                      Exercises      General Comments        Pertinent Vitals/Pain Pain Assessment: Faces Faces Pain Scale: Hurts little more Pain Location: R hip Pain Descriptors / Indicators: Discomfort;Grimacing;Guarding Pain Intervention(s): Monitored during session;Limited activity within patient's tolerance    Home Living                      Prior Function            PT Goals (current goals can now be found in the care plan section) Acute Rehab PT Goals Patient Stated Goal: none stated PT Goal Formulation: Patient unable to participate in goal setting Time For Goal Achievement: 03/27/16 Potential to Achieve Goals: Fair Progress towards PT goals: Not progressing toward goals - comment    Frequency    Min 2X/week      PT Plan Current plan remains appropriate    Co-evaluation PT/OT/SLP Co-Evaluation/Treatment: Yes Reason for Co-Treatment: For patient/therapist safety;Complexity of the patient's impairments (multi-system involvement) PT goals addressed during session: Mobility/safety  with mobility;Balance       End of Session   Activity Tolerance: Treatment limited secondary to agitation Patient left: in bed;with call bell/phone within reach;with family/visitor present     Time: [id number] PT Time Calculation (min) (ACUTE ONLY): 17 min  Charges:  $Therapeutic Activity: 8-22 mins                    G CodesAlessandra Bevels:      Eloisa Chokshi M Waseem Suess 03/18/2016, 9:08 AM Deborah ChalkJennifer Sharbel Sahagun, PT, DPT [phone]

## 2016-03-19 LAB — GLUCOSE, CAPILLARY
GLUCOSE-CAPILLARY: 221 mg/dL — AB (ref 65–99)
GLUCOSE-CAPILLARY: 155 mg/dL — AB (ref 65–99)
Glucose-Capillary: 165 mg/dL — ABNORMAL HIGH (ref 65–99)
Glucose-Capillary: 167 mg/dL — ABNORMAL HIGH (ref 65–99)
Glucose-Capillary: 183 mg/dL — ABNORMAL HIGH (ref 65–99)
Glucose-Capillary: 201 mg/dL — ABNORMAL HIGH (ref 65–99)

## 2016-03-19 LAB — BASIC METABOLIC PANEL
ANION GAP: 5 (ref 5–15)
BUN: 14 mg/dL (ref 6–20)
CALCIUM: 7.7 mg/dL — AB (ref 8.9–10.3)
CO2: 25 mmol/L (ref 22–32)
CREATININE: 0.94 mg/dL (ref 0.61–1.24)
Chloride: 107 mmol/L (ref 101–111)
GFR calc Af Amer: >60 mL/min (ref >60)
Glucose, Bld: 264 mg/dL — ABNORMAL HIGH (ref 65–99)
Potassium: 3.2 mmol/L — ABNORMAL LOW (ref 3.5–5.1)
Sodium: 137 mmol/L (ref 135–145)

## 2016-03-19 LAB — CBC
HCT: 26.7 % — ABNORMAL LOW (ref 39.0–52.0)
HEMOGLOBIN: 8.7 g/dL — AB (ref 13.0–17.0)
MCH: 29.3 pg (ref 26.0–34.0)
MCHC: 32.6 g/dL (ref 30.0–36.0)
MCV: 89.9 fL (ref 78.0–100.0)
PLATELETS: 68 10*3/uL — AB (ref 150–400)
RBC: 2.97 MIL/uL — ABNORMAL LOW (ref 4.22–5.81)
RDW: 15.7 % — AB (ref 11.5–15.5)
WBC: 3.8 10*3/uL — ABNORMAL LOW (ref 4.0–10.5)

## 2016-03-19 LAB — MAGNESIUM: MAGNESIUM: 1.8 mg/dL (ref 1.7–2.4)

## 2016-03-19 MED ORDER — POTASSIUM CHLORIDE CRYS ER 20 MEQ PO TBCR
40.0000 meq | EXTENDED_RELEASE_TABLET | Freq: Once | ORAL | Status: AC
  Administered 2016-03-19: 40 meq via ORAL
  Filled 2016-03-19: qty 2

## 2016-03-19 MED ORDER — PREDNISONE 50 MG PO TABS
50.0000 mg | ORAL_TABLET | Freq: Every day | ORAL | Status: DC
  Administered 2016-03-20: 50 mg via ORAL
  Filled 2016-03-19: qty 1

## 2016-03-19 NOTE — Progress Notes (Signed)
Pt dtr, Tamela OddiBetsy, chooses [facility].  Per MD pt possible ready for DC tomorrow and facility can accept at that time  CSW will continue to follow  Burna SisJenna H. Suhani Stillion, LCSW Clinical Social Worker [phone]

## 2016-03-19 NOTE — Progress Notes (Signed)
Patient ID: Dakota White, male   DOB: [birth year], 83 y.o.   MRN: [id number]  PROGRESS NOTE    Dakota White  WJX:[id number] DOB: 1928/04/13 DOA: 03/12/2016  PCP: Georges Lynch, MD   Brief Narrative:   83 y.o.male with past medical history significant for chronic diastolic CHF, ejection fraction 35-40% in September 2016, symptomatic bradycardia status post pacemaker placement, hypertension, dementia, diabetes mellitus, chronic kidney disease stage III with baseline creatinine 1.53. He was hospitalized in July 2017 for right hip fracture and underwent right hip hemiarthroplasty. He was discharged to skilled nursing facility but unable to have aspirin on Lovenox because of concomitant subdural hematoma.  Patient presented 03/12/2016 status post fall. Speaking with his daughter at the bedside 03/16/2016, apparently patient's son was taking care of him and stopped all of his medications for past 3 months, has not really taken the patient to see a doctor, there was even a question of if patient was fed at all because when a home health nurse came to home she reported there was no food for patient to eat. On this admission, patient was obtunded, CT head showed acute on chronic subdural hemorrhage and per further talk with family, they declined craniotomy. Palliative was consulted for goals of care.  Assessment & Plan:  Subdural hematoma / Mechanical fall  - Acute on chronic subdural hematoma with no midline shift.  - Case discussed with neurosurgery and neurology , transferred to [facility].  - Patient with clonus seen by EDP thought to be a seizure. Was loaded with Keppra and given Ativan.  - Continue Keppra 500 mg IV Q 12 hours  - EEG - suggestive of generalized cerebral dysfunction. - Repeat CT scan showed slight increase in size of subdural hematoma since admission CT scan  - Family declined craniotomy  - Patient has a pacemaker so unable to perform MRI to rule out CVA  Hypoglycemia / diabetes  mellitus2 without complications with long term insulin use - Patient has recurrent hypoglycemia likely due to sepsis - On SSI at this time - Continue steroids but taper to once a day regimen, change to PO prednisone from solu-cortef  Sepsis secondary to ESBL UTI / Aerococcus viridans bacteremia / Hypothermia - Has met sepsis criteria and based on urine culture and blood culture looks like ESBL UTI as well as Aerococcus viridans bacteremia - Continue meropenem - Change solumedrol to PO prednisone today   Severe Dementia without behavioral disturbance - Plan for discharge to skilled nursing facility once medically stable however still deciding whether this would be here in [place] or [place] area  Hypokalemia / hypomagnesemia - Due to sepsis - Supplemented   Thrombocytopenia - Chronic, likely in the setting of subdural hematoma  Pressure injury of the skin, sacrum, right heel and left pretibial wound  - Appreciate wound care assessment - Right heel unstageable pressure injury - Right heel: 1.5cm x 1.5cmx 0cm - Trauma wound left pretibial wound - Left pretibial: 10cm x 0.5cm x 0cm - Dressing procedure/placement/frequency: Prevalon boots in place already for offloading heels. Silicone foam to the right heel and sacrum  Adult failure to thrive / severe protein calorie malnutrition - Evaluated by swallow evaluation team, determined to be at high risk of aspiration but comfort feeding allowed    DVT prophylaxis: SCD's bilaterally  Code Status: DNR/DNI Family Communication: updating pt daughter daily  Disposition Plan:  discharge once temp stable for at least 24-48 hours without bear hugger  Consultants:   Palliative  care   Neurology  SLP - severe aspiration risk as of 2/11; comfort feeds only   WOC  Procedures:   None   Antimicrobials:   Rocephin 03/12/2016   Cefepime 03/12/2016 --> 03/15/2016   Vanco 03/14/2016 --> 03/15/2016  Meropenem 03/15/2016  -->   Subjective: No overnight events.   Objective: Vitals:   03/19/16 0000 03/19/16 0400 03/19/16 0747 03/19/16 1132  BP: (!) 150/71 (!) 153/69 137/79 136/62  Pulse:  70 91 71  Resp:  _0 Temp: 98.1 F (36.7 C) 97.4 F (36.3 C) 97.4 F (36.3 C) 98.5 F (36.9 C)  TempSrc: Oral Axillary Oral Oral  SpO2:  100% 100% 100%  Weight:      Height:        Intake/Output Summary (Last 24 hours) at 03/19/16 1321 Last data filed at 03/19/16 1010  Gross per 24 hour  Intake             1948 ml  Output             1425 ml  Net              523 ml   Filed Weights   03/12/16 2031 03/16/16 0406  Weight: 64.8 kg (142 lb 13.7 oz) 65.6 kg (144 lb 10 oz)    Examination:  General exam: No distress, Respiratory system: No wheezing, no rhonchi  Cardiovascular system: S1 & S2 heard, rate controlled Gastrointestinal system: (+) BS, non tender, not distended  Central nervous system: no focal deficits  Extremities: palpable pulses Skin: has few skin tears on arms, skin lesions on anterior shin Psychiatry: normal behavior  Data Reviewed: I have personally reviewed following labs and imaging studies  CBC:  Recent Labs Lab 03/12/16 1718  03/15/16 0223 03/16/16 1053 03/17/16 0845 03/18/16 0249 03/19/16 0344  WBC 3.2*  < > 3.8* 3.8* 3.7* 3.7* 3.8*  NEUTROABS 2.5  --   --   --   --   --   --   HGB 8.9*  < > 8.9* 9.4* 10.1* 8.4* 8.7*  HCT 25.2*  < > 26.2* 27.7* 30.2* 24.9* 26.7*  MCV 85.4  < > 87.3 87.1 88.3 88.0 89.9  PLT 84*  < > 78* 76* 72* 72* 68*  < > = values in this interval not displayed. Basic Metabolic Panel:  Recent Labs Lab 03/12/16 1718 03/13/16 0916 03/14/16 0354 03/15/16 0223 03/18/16 0249 03/19/16 0344  NA 138  --  137 132* 140 137  K 2.9*  --  4.5 4.5 3.2* 3.2*  CL 111  --  115* 107 112* 107  CO2 20*  --  16* 17* 22 25  GLUCOSE 56*  --  124* 262* 231* 264*  BUN 23*  --  _1 CREATININE 1.10  --  1.25* 1.35* 1.08 0.94  CALCIUM 7.6*  --  7.5*  8.2* 7.9* 7.7*  MG  --  1.4*  --  1.5* 1.5* 1.8   GFR: Estimated Creatinine Clearance: 53.3 mL/min (by C-G formula based on SCr of 0.94 mg/dL). Liver Function Tests:  Recent Labs Lab 03/12/16 1718 03/15/16 0223  AST 24 22  ALT 16* 17  ALKPHOS 83 84  BILITOT 0.4 0.9  PROT 5.6* 5.6*  ALBUMIN 2.5* 2.3*   No results for input(s): LIPASE, AMYLASE in the last 168 hours. No results for input(s): AMMONIA in the last 168 hours. Coagulation Profile: No results for input(s): INR, PROTIME in the last 168 hours.  Cardiac Enzymes: No results for input(s): CKTOTAL, CKMB, CKMBINDEX, TROPONINI in the last 168 hours. BNP (last 3 results) No results for input(s): PROBNP in the last 8760 hours. HbA1C: No results for input(s): HGBA1C in the last 72 hours. CBG:  Recent Labs Lab 03/18/16 2019 03/19/16 0026 03/19/16 0336 03/19/16 0746 03/19/16 1130  GLUCAP 241* 201* 221* 155* 165*   Lipid Profile: No results for input(s): CHOL, HDL, LDLCALC, TRIG, CHOLHDL, LDLDIRECT in the last 72 hours. Thyroid Function Tests: No results for input(s): TSH, T4TOTAL, FREET4, T3FREE, THYROIDAB in the last 72 hours. Anemia Panel: No results for input(s): VITAMINB12, FOLATE, FERRITIN, TIBC, IRON, RETICCTPCT in the last 72 hours. Urine analysis:    Component Value Date/Time   COLORURINE AMBER (A) 03/12/2016 1300   APPEARANCEUR TURBID (A) 03/12/2016 1300   LABSPEC 1.010 03/12/2016 1300   PHURINE 7.0 03/12/2016 1300   GLUCOSEU NEGATIVE 03/12/2016 1300   HGBUR MODERATE (A) 03/12/2016 1300   BILIRUBINUR NEGATIVE 03/12/2016 1300   BILIRUBINUR NEG 11/11/2014 1517   KETONESUR NEGATIVE 03/12/2016 1300   PROTEINUR 30 (A) 03/12/2016 1300   UROBILINOGEN 0.2 11/11/2014 1517   UROBILINOGEN 1.0 10/31/2014 2218   NITRITE POSITIVE (A) 03/12/2016 1300   LEUKOCYTESUR LARGE (A) 03/12/2016 1300   Sepsis Labs: _0 (procalcitonin:4,lacticidven:4)  Culture, blood (Routine x 2)     Status: None (Preliminary result)    Collection Time: 03/12/16  8:30 AM  Result Value Ref Range Status   Specimen Description BLOOD RIGHT ANTECUBITAL  Final   Culture   Final    NO GROWTH 3 DAYS    Report Status PENDING  Incomplete  Culture, blood (Routine x 2)     Status: Abnormal   Collection Time: 03/12/16 10:30 AM  Result Value Ref Range Status   Specimen Description BLOOD RIGHT HAND  Final   Special Requests IN PEDIATRIC BOTTLE 4ML  Final   Culture  Setup Time   Final   Culture (A)  Final    AEROCOCCUS VIRIDANS    Report Status 03/14/2016 FINAL  Final  Blood Culture ID Panel (Reflexed)     Status: None   Collection Time: 03/12/16 10:30 AM  Result Value Ref Range Status   Enterococcus species NOT DETECTED NOT DETECTED Final   Listeria monocytogenes NOT DETECTED NOT DETECTED Final   Staphylococcus species NOT DETECTED NOT DETECTED Final   Staphylococcus aureus NOT DETECTED NOT DETECTED Final   Streptococcus species NOT DETECTED NOT DETECTED Final   Streptococcus agalactiae NOT DETECTED NOT DETECTED Final   Streptococcus pneumoniae NOT DETECTED NOT DETECTED Final   Streptococcus pyogenes NOT DETECTED NOT DETECTED Final   Acinetobacter baumannii NOT DETECTED NOT DETECTED Final   Enterobacteriaceae species NOT DETECTED NOT DETECTED Final   Enterobacter cloacae complex NOT DETECTED NOT DETECTED Final   Escherichia coli NOT DETECTED NOT DETECTED Final   Klebsiella oxytoca NOT DETECTED NOT DETECTED Final   Klebsiella pneumoniae NOT DETECTED NOT DETECTED Final   Proteus species NOT DETECTED NOT DETECTED Final   Serratia marcescens NOT DETECTED NOT DETECTED Final   Haemophilus influenzae NOT DETECTED NOT DETECTED Final   Neisseria meningitidis NOT DETECTED NOT DETECTED Final   Pseudomonas aeruginosa NOT DETECTED NOT DETECTED Final   Candida albicans NOT DETECTED NOT DETECTED Final   Candida glabrata NOT DETECTED NOT DETECTED Final   Candida krusei NOT DETECTED NOT DETECTED Final   Candida parapsilosis NOT  DETECTED NOT DETECTED Final   Candida tropicalis NOT DETECTED NOT DETECTED Final    Comment: Performed at [facility]  [facility], [place] [address] [place], [place] [address]  Urine culture     Status: Abnormal   Collection Time: 03/12/16  1:00 PM  Result Value Ref Range Status   Specimen Description URINE, CATHETERIZED  Final   Special Requests NONE  Final   Culture (A)  Final    >=100,000 COLONIES/mL KLEBSIELLA PNEUMONIAE    Report Status 03/15/2016 FINAL  Final      Susceptibility   Klebsiella pneumoniae - MIC*    AMPICILLIN >=32 RESISTANT Resistant     CEFAZOLIN >=64 RESISTANT Resistant     CEFTRIAXONE >=64 RESISTANT Resistant     CIPROFLOXACIN >=4 RESISTANT Resistant     GENTAMICIN >=16 RESISTANT Resistant     IMIPENEM <=0.25 SENSITIVE Sensitive     NITROFURANTOIN 32 SENSITIVE Sensitive     TRIMETH/SULFA <=20 SENSITIVE Sensitive     AMPICILLIN/SULBACTAM >=32 RESISTANT Resistant     PIP/TAZO 32 INTERMEDIATE Intermediate     Extended ESBL POSITIVE Resistant     * >=100,000 COLONIES/mL KLEBSIELLA PNEUMONIAE  MRSA PCR Screening     Status: None   Collection Time: 03/12/16  8:37 PM  Result Value Ref Range Status   MRSA by PCR NEGATIVE NEGATIVE Final      Radiology Studies: Ct Head Wo Contrast Result Date: 03/14/2016 Very slight additional bleeding into the left subdural hematoma with slight increased thickness anteriorly, from 13 mm to 15 mm. Stable posteriorly. No measurable increased mass effect. No shift. Electronically Signed   By: Nelson Chimes M.D.   On: 03/14/2016 13:54   Ct Head Wo Contrast Result Date: 03/13/2016 1. No significant interval change in the acute on chronic left hemispheric subdural hematoma compared with 03/12/2016, measuring 17 mm in maximum thickness compared with 17 mm on 03/12/2016. 2. Mild to moderate periventricular small vessel ischemic changes. Electronically Signed   By: Donavan Foil M.D.   On: 03/13/2016 01:53   Ct Head Wo Contrast Result Date:  03/12/2016 Mixed density left subdural hematoma, enlarged since prior study compatible with acute on chronic subdural hematoma. No significant midline shift. Chronic microvascular disease. No acute findings in the cervical spine. Critical Value/emergent results were called by telephone at the time of interpretation on 03/12/2016 at 11:58 am to Dr. Daleen Bo , who verbally acknowledged these results. Electronically Signed   By: Rolm Baptise M.D.   On: 03/12/2016 11:59   Ct Cervical Spine Wo Contrast Result Date: 03/12/2016 Mixed density left subdural hematoma, enlarged since prior study compatible with acute on chronic subdural hematoma. No significant midline shift. Chronic microvascular disease. No acute findings in the cervical spine. Critical Value/emergent results were called by telephone at the time of interpretation on 03/12/2016 at 11:58 am to Dr. Daleen Bo , who verbally acknowledged these results. Electronically Signed   By: Rolm Baptise M.D.   On: 03/12/2016 11:59     Scheduled Meds: . hydrocortisone sod succinate (SOLU-CORTEF) inj  25 mg Intravenous Q12H  . insulin aspart  0-15 Units Subcutaneous TID WC  . insulin aspart  5 Units Subcutaneous Once  . mouth rinse  15 mL Mouth Rinse BID  . meropenem (MERREM) IV  1 g Intravenous Q12H  . sodium chloride flush  3 mL Intravenous Q12H   Continuous Infusions: . sodium chloride 75 mL/hr at 03/19/16 0300     LOS: 7 days    Time spent: 15 minutes  Greater than 50% of the time spent on counseling and coordinating the care.   Lilliahna Schubring,  MD [facility] Pager [phone]  If 7PM-7AM, please contact night-coverage [email or web address] Password [email or web address] 03/19/2016, 1:21 PM

## 2016-03-19 NOTE — Progress Notes (Signed)
Speech Language Pathology Treatment: Dysphagia  Patient Details Name: Dakota White MRN: [id number] DOB: 30-Aug-1928 Today's Date: 03/19/2016 Time: 9326-7124 SLP Time Calculation (min) (ACUTE ONLY): 9 min  Assessment / Plan / Recommendation Clinical Impression  Pt fully alert this am, sitting upright and agreeable mostly to daughter given gentle verbal cues for upright posture and PO. SLP offered trials of soft chewable solids that pt accepted and masticated adequately, though slightly slowly with decreased rotary motion. Daughter agreeable to diet upgrade to improve pts intake. Will change diet to dys 3 (mech soft) with thin liquids, that pt continues to tolerate with no signs of aspiration. No SLP f/u needed.    HPI HPI: Dakota White is a 83 year old gentleman with a past medical history significant for dementia diagnosed in 2011, history of fall and hip surgery requiring repair in July 2017. Patient has underlying history of recurrent urinary tract infections, falls, history of chronic subdural brain bleeds. Patient lives at home with son and grandson will function as his 4 7 caregivers. Patient's acute active issues in this hospitalization are acute on chronic subdural bleed, possible benzodiazepine withdrawal related seizures, possible hypo-glycemia related seizures. Patient was found to have a core temperature of 84.59F and blood sugars of 35 at the time of his arrival to the emergency department. Also found to have a urine infection.       SLP Plan  All goals met     Recommendations  Diet recommendations: Regular;Thin liquid Liquids provided via: Straw Medication Administration: Crushed with puree Supervision: Full supervision/cueing for compensatory strategies;Trained caregiver to feed patient Compensations: Slow rate;Small sips/bites Postural Changes and/or Swallow Maneuvers: Seated upright 90 degrees                Oral Care Recommendations: Oral care BID Follow up  Recommendations: Skilled Nursing facility Plan: All goals met       GO               Dakota Baltimore, MA CCC-SLP [phone]  Dakota White 03/19/2016, 10:20 AM

## 2016-03-19 NOTE — Progress Notes (Signed)
I stopped by to check in on Dakota White today.  His daughter was present at bedside.  Reports he is at or very close to baseline.  Her only concern today was working to figure out financial component moving forward with plan for rehab and how to utilize his long term care insurance policy.    I advised her that the company that issued policy would be the best resource to advise her on how to proceed in enacting benefits.  Answered all questions to the best of my ability.  As goals and discharge plan seem to be outlined, palliative will no longer round on Dakota White unless there are other needs with which we can be of assistance. Please let me know if we can be of further assistance in the care of Dakota White.     NO CHARGE NOTE  Romie MinusGene Gavin Faivre, MD [facility] Palliative Medicine Team [phone]

## 2016-03-20 DIAGNOSIS — Z23 Encounter for immunization: Secondary | ICD-10-CM | POA: Diagnosis not present

## 2016-03-20 DIAGNOSIS — E1100 Type 2 diabetes mellitus with hyperosmolarity without nonketotic hyperglycemic-hyperosmolar coma (NKHHC): Secondary | ICD-10-CM | POA: Diagnosis not present

## 2016-03-20 DIAGNOSIS — R569 Unspecified convulsions: Secondary | ICD-10-CM | POA: Diagnosis not present

## 2016-03-20 DIAGNOSIS — F0391 Unspecified dementia with behavioral disturbance: Secondary | ICD-10-CM | POA: Diagnosis not present

## 2016-03-20 DIAGNOSIS — F0150 Vascular dementia without behavioral disturbance: Secondary | ICD-10-CM | POA: Diagnosis not present

## 2016-03-20 DIAGNOSIS — N401 Enlarged prostate with lower urinary tract symptoms: Secondary | ICD-10-CM | POA: Diagnosis not present

## 2016-03-20 DIAGNOSIS — M6281 Muscle weakness (generalized): Secondary | ICD-10-CM | POA: Diagnosis not present

## 2016-03-20 DIAGNOSIS — Z9181 History of falling: Secondary | ICD-10-CM | POA: Diagnosis not present

## 2016-03-20 DIAGNOSIS — R259 Unspecified abnormal involuntary movements: Secondary | ICD-10-CM | POA: Diagnosis not present

## 2016-03-20 DIAGNOSIS — W06XXXA Fall from bed, initial encounter: Secondary | ICD-10-CM | POA: Diagnosis not present

## 2016-03-20 DIAGNOSIS — Y92019 Unspecified place in single-family (private) house as the place of occurrence of the external cause: Secondary | ICD-10-CM | POA: Diagnosis not present

## 2016-03-20 DIAGNOSIS — R41841 Cognitive communication deficit: Secondary | ICD-10-CM | POA: Diagnosis not present

## 2016-03-20 DIAGNOSIS — T68XXXS Hypothermia, sequela: Secondary | ICD-10-CM | POA: Diagnosis not present

## 2016-03-20 DIAGNOSIS — E11649 Type 2 diabetes mellitus with hypoglycemia without coma: Secondary | ICD-10-CM | POA: Diagnosis not present

## 2016-03-20 DIAGNOSIS — N390 Urinary tract infection, site not specified: Secondary | ICD-10-CM | POA: Diagnosis not present

## 2016-03-20 DIAGNOSIS — F0151 Vascular dementia with behavioral disturbance: Secondary | ICD-10-CM | POA: Diagnosis not present

## 2016-03-20 DIAGNOSIS — I10 Essential (primary) hypertension: Secondary | ICD-10-CM | POA: Diagnosis not present

## 2016-03-20 DIAGNOSIS — I6200 Nontraumatic subdural hemorrhage, unspecified: Secondary | ICD-10-CM | POA: Diagnosis not present

## 2016-03-20 DIAGNOSIS — S065X0D Traumatic subdural hemorrhage without loss of consciousness, subsequent encounter: Secondary | ICD-10-CM | POA: Diagnosis not present

## 2016-03-20 DIAGNOSIS — Z1612 Extended spectrum beta lactamase (ESBL) resistance: Secondary | ICD-10-CM | POA: Diagnosis not present

## 2016-03-20 DIAGNOSIS — F0281 Dementia in other diseases classified elsewhere with behavioral disturbance: Secondary | ICD-10-CM | POA: Diagnosis not present

## 2016-03-20 DIAGNOSIS — R278 Other lack of coordination: Secondary | ICD-10-CM | POA: Diagnosis not present

## 2016-03-20 DIAGNOSIS — E119 Type 2 diabetes mellitus without complications: Secondary | ICD-10-CM | POA: Diagnosis not present

## 2016-03-20 LAB — GLUCOSE, CAPILLARY
GLUCOSE-CAPILLARY: 126 mg/dL — AB (ref 65–99)
GLUCOSE-CAPILLARY: 208 mg/dL — AB (ref 65–99)
Glucose-Capillary: 147 mg/dL — ABNORMAL HIGH (ref 65–99)

## 2016-03-20 LAB — BASIC METABOLIC PANEL
Anion gap: 7 (ref 5–15)
BUN: 14 mg/dL (ref 6–20)
CHLORIDE: 107 mmol/L (ref 101–111)
CO2: 23 mmol/L (ref 22–32)
CREATININE: 1.02 mg/dL (ref 0.61–1.24)
Calcium: 7.7 mg/dL — ABNORMAL LOW (ref 8.9–10.3)
GFR calc non Af Amer: >60 mL/min (ref >60)
Glucose, Bld: 157 mg/dL — ABNORMAL HIGH (ref 65–99)
Potassium: 4.5 mmol/L (ref 3.5–5.1)
Sodium: 137 mmol/L (ref 135–145)

## 2016-03-20 LAB — CBC
HCT: 27.5 % — ABNORMAL LOW (ref 39.0–52.0)
Hemoglobin: 8.9 g/dL — ABNORMAL LOW (ref 13.0–17.0)
MCH: 29.4 pg (ref 26.0–34.0)
MCHC: 32.4 g/dL (ref 30.0–36.0)
MCV: 90.8 fL (ref 78.0–100.0)
PLATELETS: 85 10*3/uL — AB (ref 150–400)
RBC: 3.03 MIL/uL — AB (ref 4.22–5.81)
RDW: 16 % — ABNORMAL HIGH (ref 11.5–15.5)
WBC: 5.8 10*3/uL (ref 4.0–10.5)

## 2016-03-20 MED ORDER — PREDNISONE 50 MG PO TABS: 5.0000 mg | ORAL_TABLET | Freq: Every day | ORAL | 0 refills | Status: DC

## 2016-03-20 MED ORDER — INSULIN ASPART 100 UNIT/ML ~~LOC~~ SOLN: 0.0000 [IU] | Freq: Three times a day (TID) | SUBCUTANEOUS | 11 refills | Status: AC

## 2016-03-20 MED ORDER — LEVETIRACETAM 500 MG PO TABS: 500.0000 mg | ORAL_TABLET | Freq: Two times a day (BID) | ORAL | 0 refills | Status: AC

## 2016-03-20 MED ORDER — PREDNISONE 5 MG PO TABS: 50.0000 mg | ORAL_TABLET | Freq: Every day | ORAL | 0 refills | Status: AC

## 2016-03-20 NOTE — Progress Notes (Signed)
Clinical Social Worker facilitated patient discharge including contacting patient family and facility to confirm patient discharge plans.  Clinical information faxed to facility and family agreeable with plan.  Nurse arranged [facility] transport [facility] .  RN to call [phone] for report prior to discharge.  Clinical Social Worker will sign off for now as social work intervention is no longer needed. Please consult us again if new need arises.  Marrianne MoodAshley Vignesh Willert, MSW, [employer]WA [phone]

## 2016-03-20 NOTE — Discharge Instructions (Signed)
Bacteremia °Introduction °Bacteremia is the presence of bacteria in the blood. A small amount of bacteria may not cause any symptoms. °Sometimes, the bacteria spread and cause infection in other parts of the body, such as the heart, joints, bones, or brain. Having a great amount of bacteria can cause a serious, sometimes life-threatening infection called sepsis. °What are the causes? °This condition is caused by bacteria that get into the blood. Bacteria can enter the blood: °· During a dental or medical procedure. °· After you brush your teeth so hard that the gums bleed. °· Through a scrape or cut on your skin. °More severe types of bacteremia can be caused by: °· A bacterial infection, such as pneumonia, that spreads to the blood. °· Using a dirty needle. °What increases the risk? °This condition is more likely to develop in: °· Children and elderly adults. °· People who have a long-lasting (chronic) disease or medical condition. °· People who have an artificial joint or heart valve. °· People who have heart valve disease. °· People who have a tube, such as a catheter or IV tube, that has been inserted for a medical treatment. °· People who have a weak body defense system (immune system). °· People who use IV drugs. °What are the signs or symptoms? °Usually, this condition does not cause symptoms when it is mild. When it is more serious, it may cause: °· Fever. °· Chills. °· Racing heart. °· Shortness of breath. °· Dizziness. °· Weakness. °· Confusion. °· Nausea or vomiting. °· Diarrhea. °Bacteremia that has spread to other parts of the body may cause symptoms in those areas. °How is this diagnosed? °This condition may be diagnosed with a physical exam and tests, such as: °· A complete blood count (CBC). This test looks for signs of infection. °· Blood cultures. These look for bacteria in your blood. °· Tests of any IV tubes. These look for a source of infection. °· Urine tests. °· Imaging tests, such as an  X-ray, CT scan, MRI, or heart ultrasound. °How is this treated? °If the condition is mild, treatment is usually not needed. Usually, the body’s immune system will remove the bacteria. If the condition is more serious, it may be treated with: °· Antibiotic medicines through an IV tube. These may be given for about 2 weeks. At first, the antibiotic that is given may kill most types of blood bacteria. If your test results show that a certain kind of bacteria is causing problems, the antibiotic may be changed to kill only the bacteria that are causing problems. °· Antibiotics taken by mouth. °· Removing any catheter or IV tube that is a source of infection. °· Blood pressure and breathing support, if needed. °· Surgery to control the source or spread of infection, if needed. °Follow these instructions at home: °· Take over-the-counter and prescription medicines only as told by your health care provider. °· If you were prescribed an antibiotic, take it as told by your health care provider. Do not stop taking the antibiotic even if you start to feel better. °· Rest at home until your condition is under control. °· Drink enough fluid to keep your urine clear or pale yellow. °· Keep all follow-up visits as told by your health care provider. This is important. °How is this prevented? °Take these actions to help prevent future episodes of bacteremia: °· Get all vaccinations as recommended by your health care provider. °· Clean and cover scrapes or cuts. °· Bathe regularly. °· Wash your   hands often. °· Before any dental or surgical procedure, ask your health care provider if you should take an antibiotic. °Contact a health care provider if: °· Your symptoms get worse. °· You continue to have symptoms after treatment. °· You develop new symptoms after treatment. °Get help right away if: °· You have chest pain or trouble breathing. °· You develop confusion, dizziness, or weakness. °· You develop pale skin. °This information is  not intended to replace advice given to you by your health care provider. Make sure you discuss any questions you have with your health care provider. °Document Released: 11/01/2005 Document Revised: 08/08/2015 Document Reviewed: 03/23/2014 °© 2017 [employer] ° °

## 2016-03-20 NOTE — Progress Notes (Signed)
[facility].

## 2016-03-20 NOTE — Discharge Summary (Addendum)
Physician Discharge Summary  Dakota White CVE:[id number] DOB: 06/28/1928 DOA: 03/12/2016  PCP: Georges Lynch, MD  Admit date: 03/12/2016 Discharge date: 03/20/2016  Recommendations for Outpatient Follow-up:  1. Taper down prednisone starting from 50 mg a day, taper down by 5 mg a day down to 0 mg. for ex, today 50 mg, tomorrow 45 mg, then 40 mg the following day and etc... 2. Continue Keppra 500 mg Q 12 hours 3. Please note below wound care recommendations for pressure skin injury   Discharge Diagnoses:  Active Problems:   Subdural hematoma (HCC)   Seizure (HCC)   Cough   Fall   Hypothermia   Pressure injury of skin    Discharge Condition: stable   Diet recommendation: as tolerated   History of present illness:  83 y.o.male with past medical history significant for chronic diastolic CHF, ejection fraction 35-40% in September 2016, symptomatic bradycardia status post pacemaker placement, hypertension, dementia, diabetes mellitus, chronic kidney disease stage III with baseline creatinine 1.53. He was hospitalized in July 2017 for right hip fracture and underwent right hip hemiarthroplasty. He was discharged to skilled nursing facility but unable to have aspirin on Lovenox because of concomitant subdural hematoma.  Patient presented 03/12/2016 status post fall. Speaking with his daughter at the bedside 03/16/2016, apparently patient's son was taking care of him and stopped all of his medications for past 3 months, has not really taken the patient to see a doctor, there was even a question of if patient was fed at all because when a home health nurse came to home she reported there was no food for patient to eat. On this admission, patient was obtunded, CT head showed acute on chronic subdural hemorrhage and per further talk with family, they declined craniotomy. Palliative was consulted for goals of care.  Hospital Course:   Subdural hematoma / Mechanical fall  - Acute on chronic  subdural hematoma with no midline shift.  - Case discussed with neurosurgery and neurology , transferred to [facility].  - Patient with clonus seen by EDP thought to be a seizure. Was loaded with Keppra and given Ativan.  - Continue keppra  - EEG - suggestive of generalized cerebral dysfunction. - Repeat CT scan showed slight increase in size of subdural hematoma since admission CT scan  - Family declined craniotomy  - Patient has a pacemaker so unable to perform MRI to rule out CVA  Hypoglycemia / diabetes mellitus2 without complications with long term insulin use - Patient has recurrent hypoglycemia likely due to sepsis - On SSI at this time  Sepsis secondary to ESBL UTI / Aerococcus viridans bacteremia / Hypothermia - Has met sepsis criteria and based on urine culture and blood culture looks like ESBL UTI as well as Aerococcus viridans bacteremia - Stop meropenem today  - Not so much worried about aerococcus bacteremia as pt has done really well over past 2 days, WBC count is WNL and no fevers or hypothermia in past 48-72 hurs - Change solumedrol to PO prednisone 2/16 - Taper down prednisone starting from 50 mg a day, taper down by 5 mg a day down to 0 mg. for ex, today 50 mg, tomorrow 45 mg, then 40 mg the following day and etc...   Severe Dementia without behavioral disturbance - Plan for discharge to skilled nursing facility once medically stable however still deciding whether this would be here in [place] or [place] area  Hypokalemia / hypomagnesemia - Due to sepsis - Supplemented  Thrombocytopenia - Chronic, likely in the setting of subdural hematoma  Pressure injury of the skin, sacrum, right heel and left pretibial wound  - Appreciate wound care assessment - Right heel unstageable pressure injury - Right heel: 1.5cm x 1.5cmx 0cm - Trauma wound left pretibial wound - Left pretibial: 10cm x 0.5cm x 0cm - Dressing procedure/placement/frequency: Prevalon  boots in place already for offloading heels. Silicone foam to the right heel and sacrum  Adult failure to thrive / severe protein calorie malnutrition - Evaluated by swallow evaluation team - Now much more alert and able to tolerate dysphagia 3     DVT prophylaxis: SCD's bilaterally  Code Status: DNR/DNI Family Communication: updating pt daughter daily at bedside    Consultants:   Palliative care   Neurology  SLP - severe aspiration risk as of 2/11; comfort feeds only   WOC  Procedures:   None   Antimicrobials:   Rocephin 03/12/2016   Cefepime 03/12/2016 --> 03/15/2016   Vanco 03/14/2016 --> 03/15/2016  Meropenem 03/15/2016 --> 03/20/2016   Signed:  Leisa Lenz, MD  [facility] 03/20/2016, 12:00 PM  Pager #: [phone]  Time spent in minutes: more than 30 minutes    Discharge Exam: Vitals:   03/20/16 0700 03/20/16 1144  BP:    Pulse:    Resp:    Temp: 97.9 F (36.6 C) 97.9 F (36.6 C)   Vitals:   03/19/16 2337 03/20/16 0454 03/20/16 0700 03/20/16 1144  BP: (!) 149/92 (!) 135/57    Pulse: 72 72    Resp: (!) 23 (!) 21    Temp: 97.5 F (36.4 C) 97.7 F (36.5 C) 97.9 F (36.6 C) 97.9 F (36.6 C)  TempSrc: Oral Oral Oral Oral  SpO2: 100% 100%    Weight:      Height:        General: Pt is alert, follows commands appropriately, not in acute distress Cardiovascular: Regular rate and rhythm, S1/S2 + Respiratory: no wheezing, no crackles, no rhonchi Abdominal: Soft, non tender, non distended, bowel sounds +, no guarding Extremities: no cyanosis, pulses palpable bilaterally DP and PT Neuro: Grossly nonfocal  Discharge Instructions  Discharge Instructions    Call MD for:  persistant nausea and vomiting    Complete by:  As directed    Call MD for:  redness, tenderness, or signs of infection (pain, swelling, redness, odor or green/yellow discharge around incision site)    Complete by:  As directed    Call MD for:  severe uncontrolled  pain    Complete by:  As directed    Diet - low sodium heart healthy    Complete by:  As directed    Discharge instructions    Complete by:  As directed    Taper down prednisone starting from 50 mg a day, taper down by 5 mg a day down to 0 mg. for ex, today 50 mg, tomorrow 45 mg, then 40 mg the following day and etc...   Increase activity slowly    Complete by:  As directed      Allergies as of 03/20/2016      Reactions   Actos [pioglitazone] Other (See Comments)   "makes me feel lousy and makes me bleed"   Ramipril Other (See Comments)   "pt not aware of allergy"      Medication List    STOP taking these medications   carvedilol 3.125 MG tablet Commonly known as:  COREG   donepezil 10 MG tablet Commonly  known as:  ARICEPT   finasteride 5 MG tablet Commonly known as:  PROSCAR   insulin glargine 100 UNIT/ML injection Commonly known as:  LANTUS   memantine 10 MG tablet Commonly known as:  NAMENDA   ONETOUCH DELICA LANCETS 96G Misc   polyethylene glycol packet Commonly known as:  MIRALAX / GLYCOLAX   sertraline 100 MG tablet Commonly known as:  ZOLOFT     TAKE these medications   acetaminophen 325 MG tablet Commonly known as:  TYLENOL Take 2 tablets (650 mg total) by mouth every 6 (six) hours as needed for mild pain (or Fever >/= 101).   insulin aspart 100 UNIT/ML injection Commonly known as:  novoLOG Inject 0-15 Units into the skin 3 (three) times daily with meals.   ipratropium-albuterol 0.5-2.5 (3) MG/3ML Soln Commonly known as:  DUONEB Take 3 mLs by nebulization 3 (three) times daily as needed (Dyspnea/wheezing).   levETIRAcetam 500 MG tablet Commonly known as:  KEPPRA Take 1 tablet (500 mg total) by mouth 2 (two) times daily.   predniSONE 5 MG tablet Commonly known as:  DELTASONE Take 10 tablets (50 mg total) by mouth daily with breakfast.       Follow-up Information    Georges Lynch, MD. Schedule an appointment as soon as possible for a visit in 1  week(s).   Specialty:  Family Medicine Contact information: [address] [place] [address] [phone]            The results of significant diagnostics from this hospitalization (including imaging, microbiology, ancillary and laboratory) are listed below for reference.    Significant Diagnostic Studies: Ct Head Wo Contrast  Result Date: 03/14/2016 CLINICAL DATA:  Followup left subdural hematoma. EXAM: CT HEAD WITHOUT CONTRAST TECHNIQUE: Contiguous axial images were obtained from the base of the skull through the vertex without intravenous contrast. COMPARISON:  03/13/2016 FINDINGS: Brain: Very slight increase in volume of the left lateral convexity subdural hematoma, particularly notable anteriorly with a maximal thickness has increased from 13 mm to 15 mm. Posteriorly, thickness is stable at 15 to 16 mm. The amount of hyperdense blood within the subdural collection is slightly increased. No significant mass effect or any midline shift, secondary to pre-existing brain atrophy. Chronic small-vessel ischemic changes as noted previously. No hydrocephalus. Vascular: There is atherosclerotic calcification of the major vessels at the base of the brain. Skull: Negative Sinuses/Orbits: Clear/normal Other: None significant IMPRESSION: Very slight additional bleeding into the left subdural hematoma with slight increased thickness anteriorly, from 13 mm to 15 mm. Stable posteriorly. No measurable increased mass effect. No shift. Electronically Signed   By: Nelson Chimes M.D.   On: 03/14/2016 13:54   Ct Head Wo Contrast  Result Date: 03/13/2016 CLINICAL DATA:  Subdural hematoma EXAM: CT HEAD WITHOUT CONTRAST TECHNIQUE: Contiguous axial images were obtained from the base of the skull through the vertex without intravenous contrast. COMPARISON:  03/12/2016, 08/14/2015 FINDINGS: Brain: No large vessel territorial infarction or focal mass lesion is visualized. Again visualized is a mixed density  left convexity subdural hematoma, this measures 1.7 cm in maximum thickness at the posterior frontal lobe, it is grossly unchanged compared with 03/12/2016 maximal measurement of 17 mm. Minimal all 1 mm midline shift to the right does not appear significantly changed. There is atrophy. Periventricular white matter hypodensity consistent with small vessel ischemic changes. The ventricles are stable in size. Vascular: No hyperdense vessels.  Carotid artery calcifications. Skull: No fracture.  Minimal fluid in the inferior mastoids.  Sinuses/Orbits: No acute abnormality Other: Small posterior scalp swelling. IMPRESSION: 1. No significant interval change in the acute on chronic left hemispheric subdural hematoma compared with 03/12/2016, measuring 17 mm in maximum thickness compared with 17 mm on 03/12/2016. 2. Mild to moderate periventricular small vessel ischemic changes. Electronically Signed   By: Donavan Foil M.D.   On: 03/13/2016 01:53   Ct Head Wo Contrast  Result Date: 03/12/2016 CLINICAL DATA:  Unwitnessed fall.  Found on floor. EXAM: CT HEAD WITHOUT CONTRAST CT CERVICAL SPINE WITHOUT CONTRAST TECHNIQUE: Multidetector CT imaging of the head and cervical spine was performed following the standard protocol without intravenous contrast. Multiplanar CT image reconstructions of the cervical spine were also generated. COMPARISON:  08/14/2015 FINDINGS: CT HEAD FINDINGS Brain: Mixed density subdural fluid collection noted on the left measuring up to 17 mm in thickness compared to 10 mm previously, compatible with acute on chronic left subdural hematoma. No intraparenchymal hemorrhage or hydrocephalus. No acute infarction. Mild chronic microvascular disease throughout the deep white matter. No midline shift. Vascular: No hyperdense vessel or unexpected calcification. Skull: No acute calvarial abnormality. Sinuses/Orbits: Visualized paranasal sinuses and mastoids clear. Orbital soft tissues unremarkable. Other: None  CT CERVICAL SPINE FINDINGS Alignment: Normal Skull base and vertebrae: No acute fracture. No primary bone lesion or focal pathologic process. Soft tissues and spinal canal: No prevertebral fluid or swelling. No visible canal hematoma. Disc levels: Degenerative disc disease changes at C5-6 and C6-7 with spurring and disc space narrowing. Upper chest: Negative Other: None IMPRESSION: Mixed density left subdural hematoma, enlarged since prior study compatible with acute on chronic subdural hematoma. No significant midline shift. Chronic microvascular disease. No acute findings in the cervical spine. Critical Value/emergent results were called by telephone at the time of interpretation on 03/12/2016 at 11:58 am to Dr. Daleen Bo , who verbally acknowledged these results. Electronically Signed   By: Rolm Baptise M.D.   On: 03/12/2016 11:59   Ct Cervical Spine Wo Contrast  Result Date: 03/12/2016 CLINICAL DATA:  Unwitnessed fall.  Found on floor. EXAM: CT HEAD WITHOUT CONTRAST CT CERVICAL SPINE WITHOUT CONTRAST TECHNIQUE: Multidetector CT imaging of the head and cervical spine was performed following the standard protocol without intravenous contrast. Multiplanar CT image reconstructions of the cervical spine were also generated. COMPARISON:  08/14/2015 FINDINGS: CT HEAD FINDINGS Brain: Mixed density subdural fluid collection noted on the left measuring up to 17 mm in thickness compared to 10 mm previously, compatible with acute on chronic left subdural hematoma. No intraparenchymal hemorrhage or hydrocephalus. No acute infarction. Mild chronic microvascular disease throughout the deep white matter. No midline shift. Vascular: No hyperdense vessel or unexpected calcification. Skull: No acute calvarial abnormality. Sinuses/Orbits: Visualized paranasal sinuses and mastoids clear. Orbital soft tissues unremarkable. Other: None CT CERVICAL SPINE FINDINGS Alignment: Normal Skull base and vertebrae: No acute fracture. No  primary bone lesion or focal pathologic process. Soft tissues and spinal canal: No prevertebral fluid or swelling. No visible canal hematoma. Disc levels: Degenerative disc disease changes at C5-6 and C6-7 with spurring and disc space narrowing. Upper chest: Negative Other: None IMPRESSION: Mixed density left subdural hematoma, enlarged since prior study compatible with acute on chronic subdural hematoma. No significant midline shift. Chronic microvascular disease. No acute findings in the cervical spine. Critical Value/emergent results were called by telephone at the time of interpretation on 03/12/2016 at 11:58 am to Dr. Daleen Bo , who verbally acknowledged these results. Electronically Signed   By: Rolm Baptise M.D.  On: 03/12/2016 11:59   Dg Chest Port 1 View  Result Date: 03/12/2016 CLINICAL DATA:  Cough. EXAM: PORTABLE CHEST 1 VIEW COMPARISON:  Radiographs of August 16, 2015. FINDINGS: The heart size and mediastinal contours are within normal limits. Both lungs are clear. No pneumothorax or pleural effusion is noted. Left-sided pacemaker is unchanged in position. The visualized skeletal structures are unremarkable. IMPRESSION: No acute cardiopulmonary abnormality seen. Electronically Signed   By: Marijo Conception, M.D.   On: 03/12/2016 09:30    Microbiology: Recent Results (from the past 240 hour(s))  Culture, blood (Routine x 2)     Status: None   Collection Time: 03/12/16  8:30 AM  Result Value Ref Range Status   Specimen Description BLOOD RIGHT ANTECUBITAL  Final   Special Requests BOTTLES DRAWN AEROBIC AND ANAEROBIC 5ML  Final   Culture   Final    NO GROWTH 5 DAYS Performed at [facility], [address], [place], [place] [address]    Report Status 03/17/2016 FINAL  Final  Culture, blood (Routine x 2)     Status: Abnormal   Collection Time: 03/12/16 10:30 AM  Result Value Ref Range Status   Specimen Description BLOOD RIGHT HAND  Final   Special Requests IN PEDIATRIC BOTTLE 4ML   Final   Culture  Setup Time   Final    GRAM POSITIVE COCCI IN PAIRS IN PEDIATRIC BOTTLE CRITICAL RESULT CALLED TO, READ BACK BY AND VERIFIED WITH: R RUMBARGER,PHARMD AT 5053 03/13/16 BY L BENFIELD    Culture (A)  Final    AEROCOCCUS VIRIDANS Standardized susceptibility testing for this organism is not available. Performed at [facility], [place] [address], [place], [place] [address]    Report Status 03/14/2016 FINAL  Final  Blood Culture ID Panel (Reflexed)     Status: None   Collection Time: 03/12/16 10:30 AM  Result Value Ref Range Status   Enterococcus species NOT DETECTED NOT DETECTED Final   Listeria monocytogenes NOT DETECTED NOT DETECTED Final   Staphylococcus species NOT DETECTED NOT DETECTED Final   Staphylococcus aureus NOT DETECTED NOT DETECTED Final   Streptococcus species NOT DETECTED NOT DETECTED Final   Streptococcus agalactiae NOT DETECTED NOT DETECTED Final   Streptococcus pneumoniae NOT DETECTED NOT DETECTED Final   Streptococcus pyogenes NOT DETECTED NOT DETECTED Final   Acinetobacter baumannii NOT DETECTED NOT DETECTED Final   Enterobacteriaceae species NOT DETECTED NOT DETECTED Final   Enterobacter cloacae complex NOT DETECTED NOT DETECTED Final   Escherichia coli NOT DETECTED NOT DETECTED Final   Klebsiella oxytoca NOT DETECTED NOT DETECTED Final   Klebsiella pneumoniae NOT DETECTED NOT DETECTED Final   Proteus species NOT DETECTED NOT DETECTED Final   Serratia marcescens NOT DETECTED NOT DETECTED Final   Haemophilus influenzae NOT DETECTED NOT DETECTED Final   Neisseria meningitidis NOT DETECTED NOT DETECTED Final   Pseudomonas aeruginosa NOT DETECTED NOT DETECTED Final   Candida albicans NOT DETECTED NOT DETECTED Final   Candida glabrata NOT DETECTED NOT DETECTED Final   Candida krusei NOT DETECTED NOT DETECTED Final   Candida parapsilosis NOT DETECTED NOT DETECTED Final   Candida tropicalis NOT DETECTED NOT DETECTED Final    Comment: Performed at  [facility], [place] [address] [place], [place] [address]  Urine culture     Status: Abnormal   Collection Time: 03/12/16  1:00 PM  Result Value Ref Range Status   Specimen Description URINE, CATHETERIZED  Final   Special Requests NONE  Final   Culture (A)  Final    >=100,000 COLONIES/mL KLEBSIELLA PNEUMONIAE Confirmed Extended Spectrum Beta-Lactamase Producer (ESBL) Performed at [facility], [place] [address] [place], [place] [address]    Report Status 03/15/2016 FINAL  Final   Organism ID, Bacteria KLEBSIELLA PNEUMONIAE (A)  Final      Susceptibility   Klebsiella pneumoniae - MIC*    AMPICILLIN >=32 RESISTANT Resistant     CEFAZOLIN >=64 RESISTANT Resistant     CEFTRIAXONE >=64 RESISTANT Resistant     CIPROFLOXACIN >=4 RESISTANT Resistant     GENTAMICIN >=16 RESISTANT Resistant     IMIPENEM <=0.25 SENSITIVE Sensitive     NITROFURANTOIN 32 SENSITIVE Sensitive     TRIMETH/SULFA <=20 SENSITIVE Sensitive     AMPICILLIN/SULBACTAM >=32 RESISTANT Resistant     PIP/TAZO 32 INTERMEDIATE Intermediate     Extended ESBL POSITIVE Resistant     * >=100,000 COLONIES/mL KLEBSIELLA PNEUMONIAE  MRSA PCR Screening     Status: None   Collection Time: 03/12/16  8:37 PM  Result Value Ref Range Status   MRSA by PCR NEGATIVE NEGATIVE Final    Comment:        The GeneXpert MRSA Assay (FDA approved for NASAL specimens only), is one component of a comprehensive MRSA colonization surveillance program. It is not intended to diagnose MRSA infection nor to guide or monitor treatment for MRSA infections.      Labs: Basic Metabolic Panel:  Recent Labs Lab 03/14/16 0354 03/15/16 0223 03/18/16 0249 03/19/16 0344 03/20/16 0228  NA 137 132* 140 137 137  K 4.5 4.5 3.2* 3.2* 4.5  CL 115* 107 112* 107 107  CO2 16* 17* 22 25 23   GLUCOSE 124* 262* 231* 264* 157*  BUN 13 12 17 14 14   CREATININE 1.25* 1.35* 1.08 0.94 1.02  CALCIUM 7.5* 8.2* 7.9* 7.7* 7.7*  MG  --  1.5* 1.5* 1.8  --     Liver Function Tests:  Recent Labs Lab 03/15/16 0223  AST 22  ALT 17  ALKPHOS 84  BILITOT 0.9  PROT 5.6*  ALBUMIN 2.3*   No results for input(s): LIPASE, AMYLASE in the last 168 hours. No results for input(s): AMMONIA in the last 168 hours. CBC:  Recent Labs Lab 03/16/16 1053 03/17/16 0845 03/18/16 0249 03/19/16 0344 03/20/16 0228  WBC 3.8* 3.7* 3.7* 3.8* 5.8  HGB 9.4* 10.1* 8.4* 8.7* 8.9*  HCT 27.7* 30.2* 24.9* 26.7* 27.5*  MCV 87.1 88.3 88.0 89.9 90.8  PLT 76* 72* 72* 68* 85*   Cardiac Enzymes: No results for input(s): CKTOTAL, CKMB, CKMBINDEX, TROPONINI in the last 168 hours. BNP: BNP (last 3 results) No results for input(s): BNP in the last 8760 hours.  ProBNP (last 3 results) No results for input(s): PROBNP in the last 8760 hours.  CBG:  Recent Labs Lab 03/19/16 1130 03/19/16 1716 03/19/16 2234 03/20/16 0450 03/20/16 0807  GLUCAP 165* 183* 167* 147* 126*

## 2016-03-20 NOTE — Progress Notes (Signed)
Mrs. Dakota LorenzoBetsy Nikolic White, patient's daughter is here during this transfer.  [place]  [facility] was called for transport.

## 2016-03-24 DIAGNOSIS — E119 Type 2 diabetes mellitus without complications: Secondary | ICD-10-CM | POA: Diagnosis not present

## 2016-03-31 DIAGNOSIS — F0391 Unspecified dementia with behavioral disturbance: Secondary | ICD-10-CM | POA: Diagnosis not present

## 2016-04-02 DIAGNOSIS — F0391 Unspecified dementia with behavioral disturbance: Secondary | ICD-10-CM | POA: Diagnosis not present

## 2016-04-10 DIAGNOSIS — S065X0D Traumatic subdural hemorrhage without loss of consciousness, subsequent encounter: Secondary | ICD-10-CM | POA: Diagnosis not present

## 2016-04-10 DIAGNOSIS — F0151 Vascular dementia with behavioral disturbance: Secondary | ICD-10-CM | POA: Diagnosis not present

## 2016-04-10 DIAGNOSIS — E1100 Type 2 diabetes mellitus with hyperosmolarity without nonketotic hyperglycemic-hyperosmolar coma (NKHHC): Secondary | ICD-10-CM | POA: Diagnosis not present

## 2016-04-21 DIAGNOSIS — E119 Type 2 diabetes mellitus without complications: Secondary | ICD-10-CM | POA: Diagnosis not present

## 2016-04-27 DIAGNOSIS — E119 Type 2 diabetes mellitus without complications: Secondary | ICD-10-CM | POA: Diagnosis not present

## 2016-04-27 DIAGNOSIS — F0390 Unspecified dementia without behavioral disturbance: Secondary | ICD-10-CM | POA: Diagnosis not present

## 2016-04-27 DIAGNOSIS — N400 Enlarged prostate without lower urinary tract symptoms: Secondary | ICD-10-CM | POA: Diagnosis not present

## 2016-04-27 DIAGNOSIS — G40309 Generalized idiopathic epilepsy and epileptic syndromes, not intractable, without status epilepticus: Secondary | ICD-10-CM | POA: Diagnosis not present

## 2016-04-30 DIAGNOSIS — M6281 Muscle weakness (generalized): Secondary | ICD-10-CM | POA: Diagnosis not present

## 2016-04-30 DIAGNOSIS — Z9181 History of falling: Secondary | ICD-10-CM | POA: Diagnosis not present

## 2016-04-30 DIAGNOSIS — F0390 Unspecified dementia without behavioral disturbance: Secondary | ICD-10-CM | POA: Diagnosis not present

## 2016-05-03 DIAGNOSIS — F0390 Unspecified dementia without behavioral disturbance: Secondary | ICD-10-CM | POA: Diagnosis not present

## 2016-05-03 DIAGNOSIS — M6281 Muscle weakness (generalized): Secondary | ICD-10-CM | POA: Diagnosis not present

## 2016-05-03 DIAGNOSIS — Z9181 History of falling: Secondary | ICD-10-CM | POA: Diagnosis not present

## 2016-05-04 DIAGNOSIS — G40309 Generalized idiopathic epilepsy and epileptic syndromes, not intractable, without status epilepticus: Secondary | ICD-10-CM | POA: Diagnosis not present

## 2016-05-04 DIAGNOSIS — S065X0D Traumatic subdural hemorrhage without loss of consciousness, subsequent encounter: Secondary | ICD-10-CM | POA: Diagnosis not present

## 2016-05-04 DIAGNOSIS — E119 Type 2 diabetes mellitus without complications: Secondary | ICD-10-CM | POA: Diagnosis not present

## 2016-05-04 DIAGNOSIS — F0280 Dementia in other diseases classified elsewhere without behavioral disturbance: Secondary | ICD-10-CM | POA: Diagnosis not present

## 2016-05-20 DIAGNOSIS — F0390 Unspecified dementia without behavioral disturbance: Secondary | ICD-10-CM | POA: Diagnosis not present

## 2016-05-20 DIAGNOSIS — Z9181 History of falling: Secondary | ICD-10-CM | POA: Diagnosis not present

## 2016-05-20 DIAGNOSIS — M6281 Muscle weakness (generalized): Secondary | ICD-10-CM | POA: Diagnosis not present

## 2016-05-25 DIAGNOSIS — Z45018 Encounter for adjustment and management of other part of cardiac pacemaker: Secondary | ICD-10-CM | POA: Diagnosis not present

## 2016-05-25 DIAGNOSIS — M6281 Muscle weakness (generalized): Secondary | ICD-10-CM | POA: Diagnosis not present

## 2016-05-25 DIAGNOSIS — I5041 Acute combined systolic (congestive) and diastolic (congestive) heart failure: Secondary | ICD-10-CM | POA: Diagnosis not present

## 2016-05-25 DIAGNOSIS — G40309 Generalized idiopathic epilepsy and epileptic syndromes, not intractable, without status epilepticus: Secondary | ICD-10-CM | POA: Diagnosis not present

## 2016-05-25 DIAGNOSIS — I442 Atrioventricular block, complete: Secondary | ICD-10-CM | POA: Diagnosis not present

## 2016-05-25 DIAGNOSIS — I110 Hypertensive heart disease with heart failure: Secondary | ICD-10-CM | POA: Diagnosis not present

## 2016-05-25 DIAGNOSIS — Z9181 History of falling: Secondary | ICD-10-CM | POA: Diagnosis not present

## 2016-05-25 DIAGNOSIS — F0390 Unspecified dementia without behavioral disturbance: Secondary | ICD-10-CM | POA: Diagnosis not present

## 2016-05-25 DIAGNOSIS — E119 Type 2 diabetes mellitus without complications: Secondary | ICD-10-CM | POA: Diagnosis not present

## 2016-05-25 DIAGNOSIS — I5042 Chronic combined systolic (congestive) and diastolic (congestive) heart failure: Secondary | ICD-10-CM | POA: Diagnosis not present

## 2016-05-27 DIAGNOSIS — F0390 Unspecified dementia without behavioral disturbance: Secondary | ICD-10-CM | POA: Diagnosis not present

## 2016-05-27 DIAGNOSIS — Z9181 History of falling: Secondary | ICD-10-CM | POA: Diagnosis not present

## 2016-05-27 DIAGNOSIS — M6281 Muscle weakness (generalized): Secondary | ICD-10-CM | POA: Diagnosis not present

## 2016-06-01 DIAGNOSIS — E11649 Type 2 diabetes mellitus with hypoglycemia without coma: Secondary | ICD-10-CM | POA: Diagnosis not present

## 2016-06-01 DIAGNOSIS — I739 Peripheral vascular disease, unspecified: Secondary | ICD-10-CM | POA: Diagnosis not present

## 2016-06-01 DIAGNOSIS — E782 Mixed hyperlipidemia: Secondary | ICD-10-CM | POA: Diagnosis not present

## 2016-06-03 DIAGNOSIS — M79672 Pain in left foot: Secondary | ICD-10-CM | POA: Diagnosis not present

## 2016-06-03 DIAGNOSIS — M79671 Pain in right foot: Secondary | ICD-10-CM | POA: Diagnosis not present

## 2016-06-03 DIAGNOSIS — E1140 Type 2 diabetes mellitus with diabetic neuropathy, unspecified: Secondary | ICD-10-CM | POA: Diagnosis not present

## 2016-06-03 DIAGNOSIS — B351 Tinea unguium: Secondary | ICD-10-CM | POA: Diagnosis not present

## 2016-06-07 DIAGNOSIS — I371 Nonrheumatic pulmonary valve insufficiency: Secondary | ICD-10-CM | POA: Diagnosis not present

## 2016-06-07 DIAGNOSIS — I082 Rheumatic disorders of both aortic and tricuspid valves: Secondary | ICD-10-CM | POA: Diagnosis not present

## 2016-06-07 DIAGNOSIS — I517 Cardiomegaly: Secondary | ICD-10-CM | POA: Diagnosis not present

## 2016-06-11 DIAGNOSIS — G40309 Generalized idiopathic epilepsy and epileptic syndromes, not intractable, without status epilepticus: Secondary | ICD-10-CM | POA: Diagnosis not present

## 2016-06-11 DIAGNOSIS — R001 Bradycardia, unspecified: Secondary | ICD-10-CM | POA: Diagnosis not present

## 2016-06-11 DIAGNOSIS — E119 Type 2 diabetes mellitus without complications: Secondary | ICD-10-CM | POA: Diagnosis not present

## 2016-06-11 DIAGNOSIS — I120 Hypertensive chronic kidney disease with stage 5 chronic kidney disease or end stage renal disease: Secondary | ICD-10-CM | POA: Diagnosis not present

## 2016-06-24 DIAGNOSIS — I5043 Acute on chronic combined systolic (congestive) and diastolic (congestive) heart failure: Secondary | ICD-10-CM | POA: Diagnosis not present

## 2016-06-24 DIAGNOSIS — E119 Type 2 diabetes mellitus without complications: Secondary | ICD-10-CM | POA: Diagnosis not present

## 2016-06-24 DIAGNOSIS — F0390 Unspecified dementia without behavioral disturbance: Secondary | ICD-10-CM | POA: Diagnosis not present

## 2016-06-24 DIAGNOSIS — G40309 Generalized idiopathic epilepsy and epileptic syndromes, not intractable, without status epilepticus: Secondary | ICD-10-CM | POA: Diagnosis not present

## 2016-06-25 DIAGNOSIS — F419 Anxiety disorder, unspecified: Secondary | ICD-10-CM | POA: Diagnosis not present

## 2016-06-25 DIAGNOSIS — F0630 Mood disorder due to known physiological condition, unspecified: Secondary | ICD-10-CM | POA: Diagnosis not present

## 2016-06-25 DIAGNOSIS — F0390 Unspecified dementia without behavioral disturbance: Secondary | ICD-10-CM | POA: Diagnosis not present

## 2016-07-14 DIAGNOSIS — I5032 Chronic diastolic (congestive) heart failure: Secondary | ICD-10-CM | POA: Diagnosis not present

## 2016-07-14 DIAGNOSIS — E785 Hyperlipidemia, unspecified: Secondary | ICD-10-CM | POA: Diagnosis not present

## 2016-07-14 DIAGNOSIS — I110 Hypertensive heart disease with heart failure: Secondary | ICD-10-CM | POA: Diagnosis not present

## 2016-07-14 DIAGNOSIS — I10 Essential (primary) hypertension: Secondary | ICD-10-CM | POA: Diagnosis not present

## 2016-07-16 DIAGNOSIS — F0630 Mood disorder due to known physiological condition, unspecified: Secondary | ICD-10-CM | POA: Diagnosis not present

## 2016-07-16 DIAGNOSIS — F419 Anxiety disorder, unspecified: Secondary | ICD-10-CM | POA: Diagnosis not present

## 2016-07-16 DIAGNOSIS — F0390 Unspecified dementia without behavioral disturbance: Secondary | ICD-10-CM | POA: Diagnosis not present

## 2016-07-22 DIAGNOSIS — N400 Enlarged prostate without lower urinary tract symptoms: Secondary | ICD-10-CM | POA: Diagnosis not present

## 2016-07-22 DIAGNOSIS — E119 Type 2 diabetes mellitus without complications: Secondary | ICD-10-CM | POA: Diagnosis not present

## 2016-07-22 DIAGNOSIS — G40309 Generalized idiopathic epilepsy and epileptic syndromes, not intractable, without status epilepticus: Secondary | ICD-10-CM | POA: Diagnosis not present

## 2016-07-22 DIAGNOSIS — F0390 Unspecified dementia without behavioral disturbance: Secondary | ICD-10-CM | POA: Diagnosis not present

## 2016-07-22 IMAGING — CR DG CHEST 2V
3 series · 3 of 3 positions shown · non-contrast
Comparison: 10/16/2014

CLINICAL DATA: Shortness of breath

EXAM:
CHEST  2 VIEW

[w chest lat (1 of 2)]
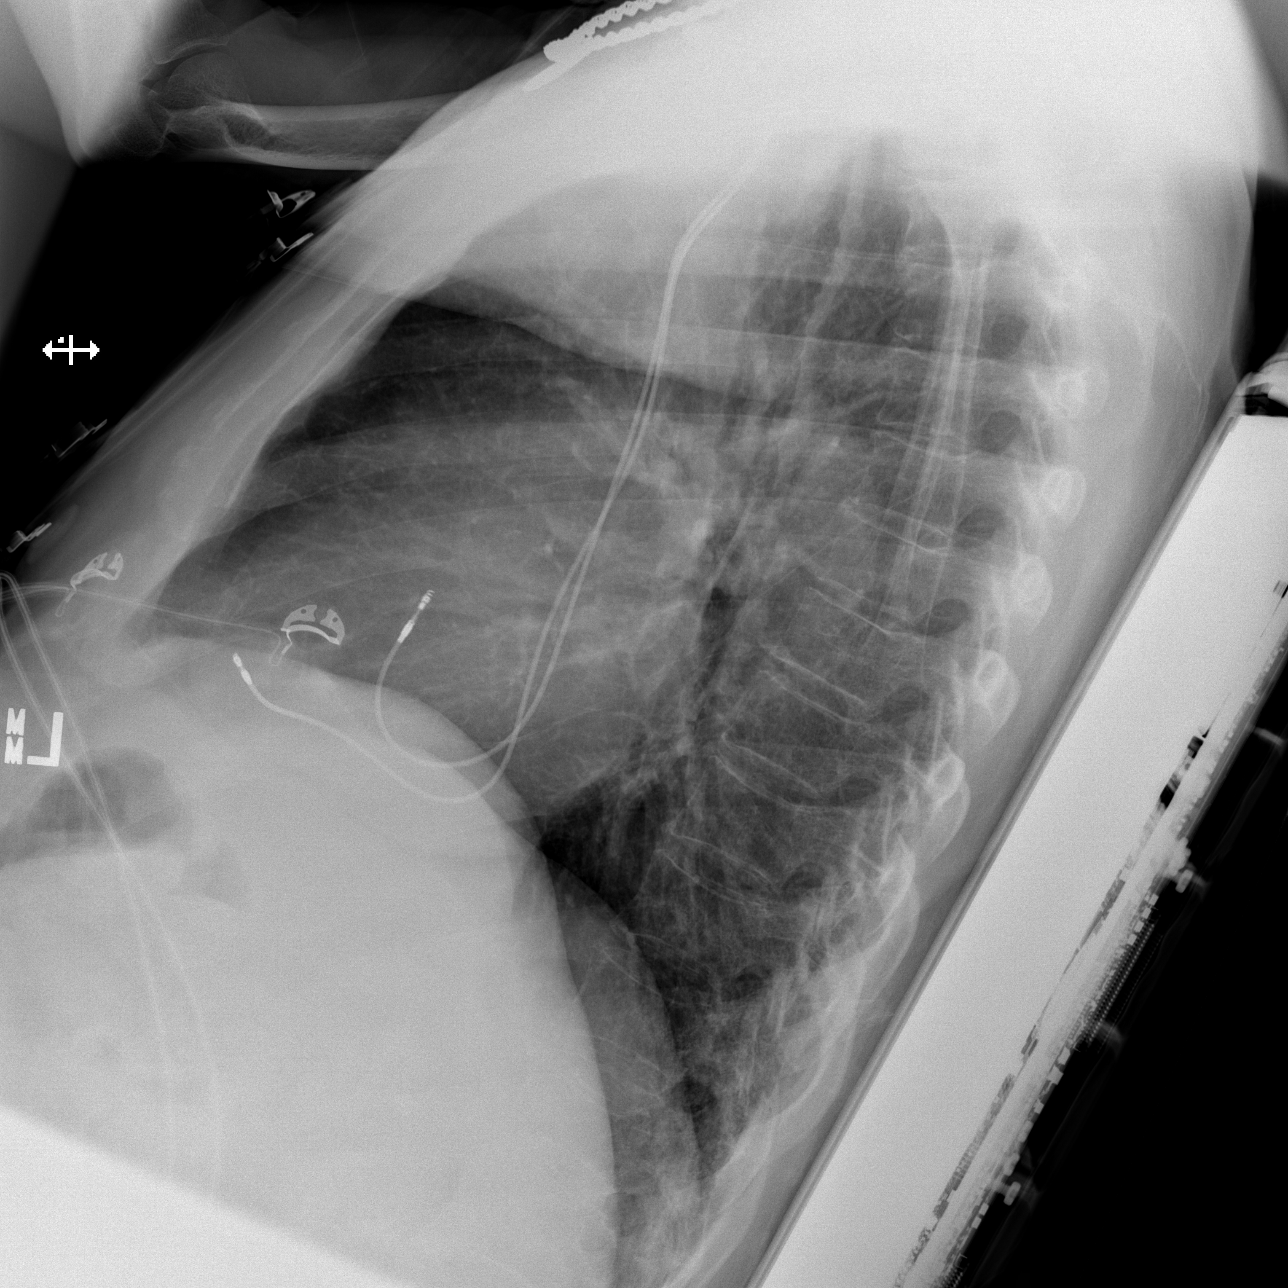

[w chest lat (2 of 2)]
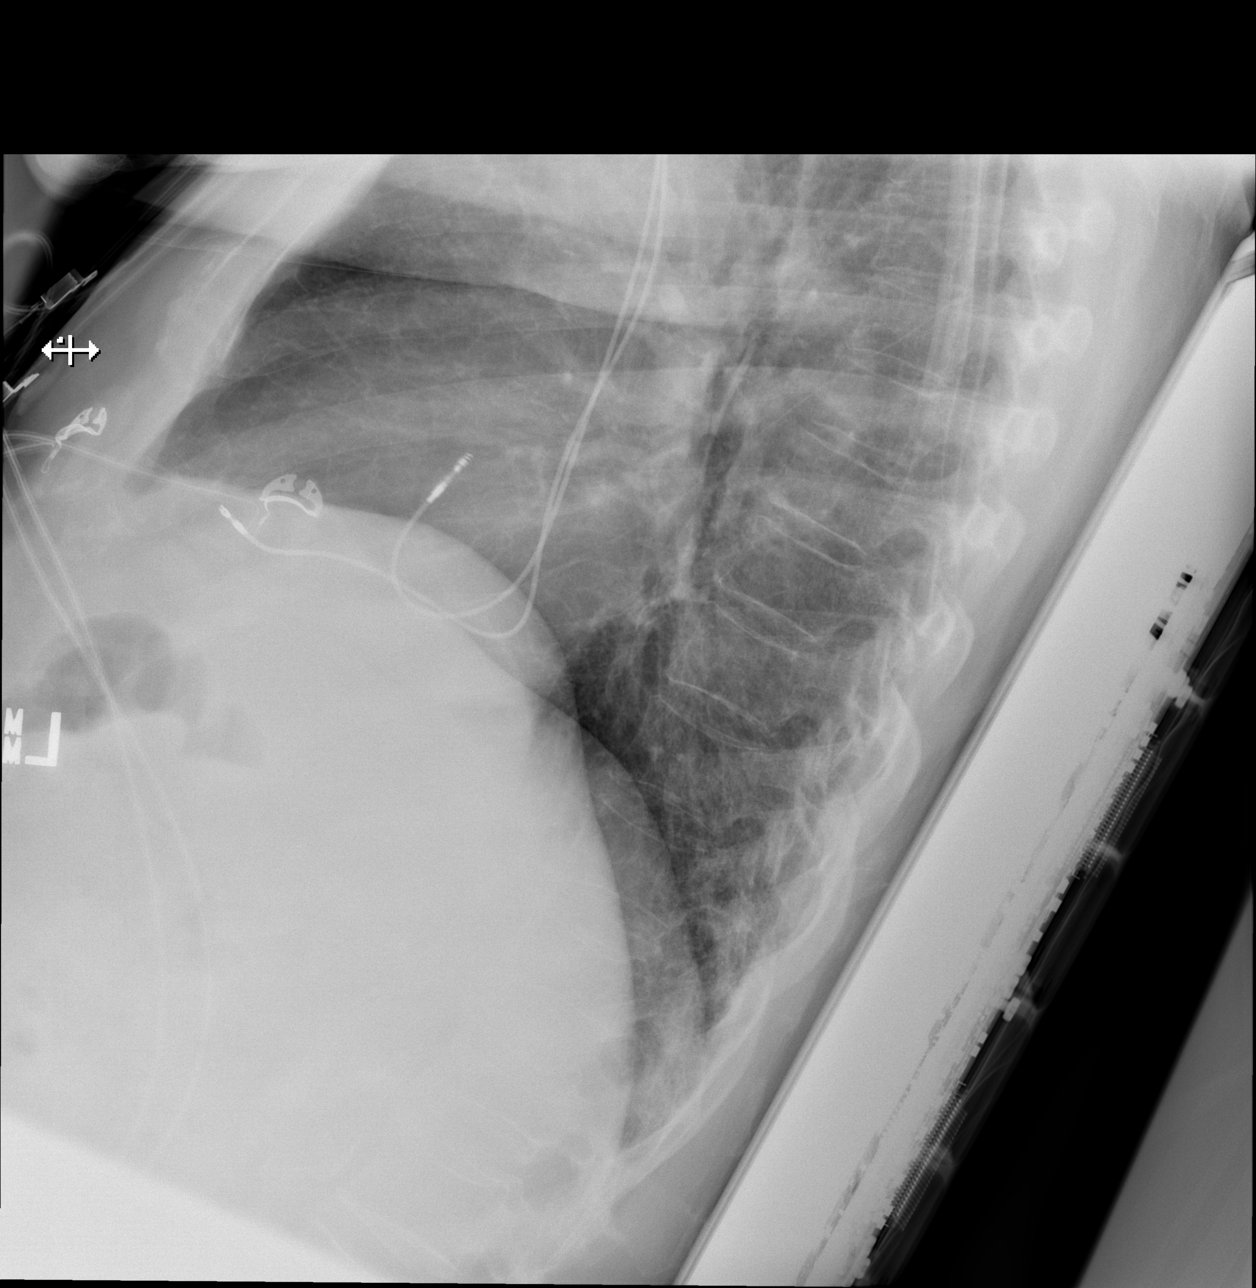

[x chest ap]
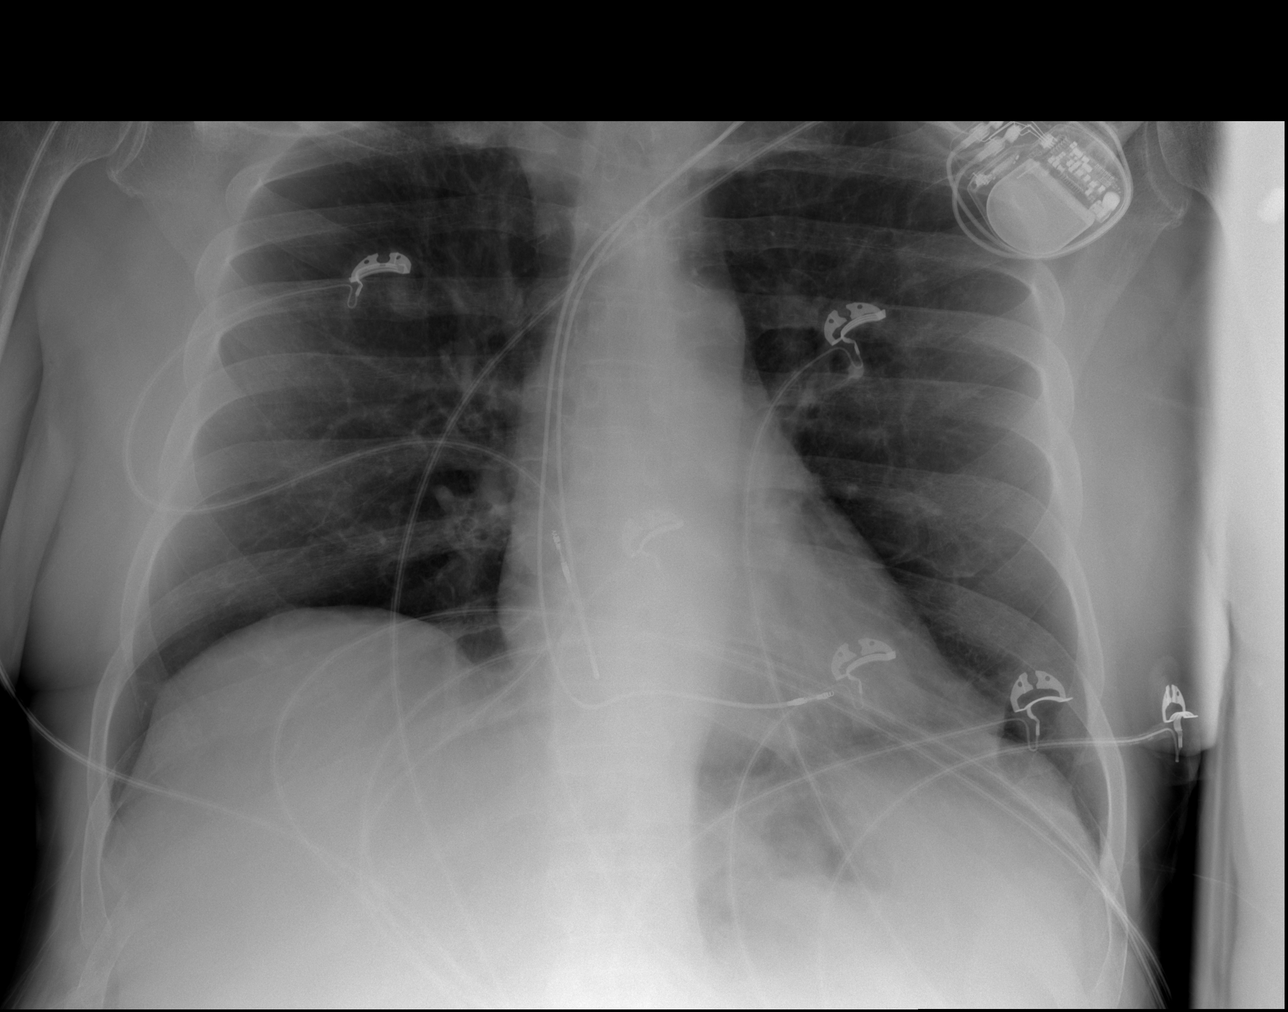

[3 of 3 positions shown; findings below may reference images not displayed]

FINDINGS: Dual-chamber pacer leads from the left are in stable position.
Normal heart size and aortic contours.

There is no edema, consolidation, effusion, or pneumothorax.
IMPRESSION: Stable exam.  No evidence of acute disease.

## 2016-08-03 DIAGNOSIS — R1311 Dysphagia, oral phase: Secondary | ICD-10-CM | POA: Diagnosis not present

## 2016-08-03 DIAGNOSIS — F0281 Dementia in other diseases classified elsewhere with behavioral disturbance: Secondary | ICD-10-CM | POA: Diagnosis not present

## 2016-08-09 DIAGNOSIS — R1311 Dysphagia, oral phase: Secondary | ICD-10-CM | POA: Diagnosis not present

## 2016-08-09 DIAGNOSIS — F0281 Dementia in other diseases classified elsewhere with behavioral disturbance: Secondary | ICD-10-CM | POA: Diagnosis not present

## 2016-08-10 DIAGNOSIS — E119 Type 2 diabetes mellitus without complications: Secondary | ICD-10-CM | POA: Diagnosis not present

## 2016-08-10 DIAGNOSIS — F0391 Unspecified dementia with behavioral disturbance: Secondary | ICD-10-CM | POA: Diagnosis not present

## 2016-08-10 DIAGNOSIS — I5042 Chronic combined systolic (congestive) and diastolic (congestive) heart failure: Secondary | ICD-10-CM | POA: Diagnosis not present

## 2016-08-10 DIAGNOSIS — G40309 Generalized idiopathic epilepsy and epileptic syndromes, not intractable, without status epilepticus: Secondary | ICD-10-CM | POA: Diagnosis not present

## 2016-08-11 DIAGNOSIS — R1311 Dysphagia, oral phase: Secondary | ICD-10-CM | POA: Diagnosis not present

## 2016-08-11 DIAGNOSIS — F0281 Dementia in other diseases classified elsewhere with behavioral disturbance: Secondary | ICD-10-CM | POA: Diagnosis not present

## 2016-08-13 DIAGNOSIS — R1311 Dysphagia, oral phase: Secondary | ICD-10-CM | POA: Diagnosis not present

## 2016-08-13 DIAGNOSIS — F0281 Dementia in other diseases classified elsewhere with behavioral disturbance: Secondary | ICD-10-CM | POA: Diagnosis not present

## 2016-08-18 DIAGNOSIS — J4530 Mild persistent asthma, uncomplicated: Secondary | ICD-10-CM | POA: Diagnosis not present

## 2016-08-18 DIAGNOSIS — G40309 Generalized idiopathic epilepsy and epileptic syndromes, not intractable, without status epilepticus: Secondary | ICD-10-CM | POA: Diagnosis not present

## 2016-08-18 DIAGNOSIS — F0281 Dementia in other diseases classified elsewhere with behavioral disturbance: Secondary | ICD-10-CM | POA: Diagnosis not present

## 2016-08-18 DIAGNOSIS — I5042 Chronic combined systolic (congestive) and diastolic (congestive) heart failure: Secondary | ICD-10-CM | POA: Diagnosis not present

## 2016-08-18 DIAGNOSIS — F0391 Unspecified dementia with behavioral disturbance: Secondary | ICD-10-CM | POA: Diagnosis not present

## 2016-08-18 DIAGNOSIS — R1311 Dysphagia, oral phase: Secondary | ICD-10-CM | POA: Diagnosis not present

## 2016-09-03 DIAGNOSIS — J4530 Mild persistent asthma, uncomplicated: Secondary | ICD-10-CM | POA: Diagnosis not present

## 2016-09-03 DIAGNOSIS — G40309 Generalized idiopathic epilepsy and epileptic syndromes, not intractable, without status epilepticus: Secondary | ICD-10-CM | POA: Diagnosis not present

## 2016-09-03 DIAGNOSIS — E118 Type 2 diabetes mellitus with unspecified complications: Secondary | ICD-10-CM | POA: Diagnosis not present

## 2016-09-03 DIAGNOSIS — I5042 Chronic combined systolic (congestive) and diastolic (congestive) heart failure: Secondary | ICD-10-CM | POA: Diagnosis not present

## 2016-09-09 DIAGNOSIS — R278 Other lack of coordination: Secondary | ICD-10-CM | POA: Diagnosis not present

## 2016-09-09 DIAGNOSIS — F0390 Unspecified dementia without behavioral disturbance: Secondary | ICD-10-CM | POA: Diagnosis not present

## 2016-09-09 DIAGNOSIS — N390 Urinary tract infection, site not specified: Secondary | ICD-10-CM | POA: Diagnosis not present

## 2016-09-09 DIAGNOSIS — E860 Dehydration: Secondary | ICD-10-CM | POA: Diagnosis not present

## 2016-09-09 DIAGNOSIS — E162 Hypoglycemia, unspecified: Secondary | ICD-10-CM | POA: Diagnosis not present

## 2016-09-09 DIAGNOSIS — R404 Transient alteration of awareness: Secondary | ICD-10-CM | POA: Diagnosis not present

## 2016-09-13 DIAGNOSIS — I6203 Nontraumatic chronic subdural hemorrhage: Secondary | ICD-10-CM | POA: Diagnosis not present

## 2016-09-13 DIAGNOSIS — F0390 Unspecified dementia without behavioral disturbance: Secondary | ICD-10-CM | POA: Diagnosis not present

## 2016-09-13 DIAGNOSIS — E785 Hyperlipidemia, unspecified: Secondary | ICD-10-CM | POA: Diagnosis not present

## 2016-09-13 DIAGNOSIS — I5042 Chronic combined systolic (congestive) and diastolic (congestive) heart failure: Secondary | ICD-10-CM | POA: Diagnosis not present

## 2016-09-13 DIAGNOSIS — Y92129 Unspecified place in nursing home as the place of occurrence of the external cause: Secondary | ICD-10-CM | POA: Diagnosis not present

## 2016-09-13 DIAGNOSIS — S0542XA Penetrating wound of orbit with or without foreign body, left eye, initial encounter: Secondary | ICD-10-CM | POA: Diagnosis not present

## 2016-09-13 DIAGNOSIS — S0990XA Unspecified injury of head, initial encounter: Secondary | ICD-10-CM | POA: Diagnosis not present

## 2016-09-13 DIAGNOSIS — M47812 Spondylosis without myelopathy or radiculopathy, cervical region: Secondary | ICD-10-CM | POA: Diagnosis not present

## 2016-09-13 DIAGNOSIS — M4312 Spondylolisthesis, cervical region: Secondary | ICD-10-CM | POA: Diagnosis not present

## 2016-09-13 DIAGNOSIS — N390 Urinary tract infection, site not specified: Secondary | ICD-10-CM | POA: Diagnosis not present

## 2016-09-13 DIAGNOSIS — Z950 Presence of cardiac pacemaker: Secondary | ICD-10-CM | POA: Diagnosis not present

## 2016-09-13 DIAGNOSIS — W06XXXA Fall from bed, initial encounter: Secondary | ICD-10-CM | POA: Diagnosis not present

## 2016-09-13 DIAGNOSIS — S065X9A Traumatic subdural hemorrhage with loss of consciousness of unspecified duration, initial encounter: Secondary | ICD-10-CM | POA: Diagnosis not present

## 2016-09-13 DIAGNOSIS — I6201 Nontraumatic acute subdural hemorrhage: Secondary | ICD-10-CM | POA: Diagnosis not present

## 2016-09-13 DIAGNOSIS — R296 Repeated falls: Secondary | ICD-10-CM | POA: Diagnosis not present

## 2016-09-13 DIAGNOSIS — S065X0A Traumatic subdural hemorrhage without loss of consciousness, initial encounter: Secondary | ICD-10-CM | POA: Diagnosis not present

## 2016-09-13 DIAGNOSIS — F329 Major depressive disorder, single episode, unspecified: Secondary | ICD-10-CM | POA: Diagnosis not present

## 2016-09-13 DIAGNOSIS — S41119A Laceration without foreign body of unspecified upper arm, initial encounter: Secondary | ICD-10-CM | POA: Diagnosis not present

## 2016-09-13 DIAGNOSIS — E119 Type 2 diabetes mellitus without complications: Secondary | ICD-10-CM | POA: Diagnosis not present

## 2016-09-13 DIAGNOSIS — E1140 Type 2 diabetes mellitus with diabetic neuropathy, unspecified: Secondary | ICD-10-CM | POA: Diagnosis not present

## 2016-09-13 DIAGNOSIS — D696 Thrombocytopenia, unspecified: Secondary | ICD-10-CM | POA: Diagnosis not present

## 2016-09-13 DIAGNOSIS — Z66 Do not resuscitate: Secondary | ICD-10-CM | POA: Diagnosis not present

## 2016-09-13 DIAGNOSIS — Z794 Long term (current) use of insulin: Secondary | ICD-10-CM | POA: Diagnosis not present

## 2016-09-13 DIAGNOSIS — E1122 Type 2 diabetes mellitus with diabetic chronic kidney disease: Secondary | ICD-10-CM | POA: Diagnosis not present

## 2016-09-13 DIAGNOSIS — Z993 Dependence on wheelchair: Secondary | ICD-10-CM | POA: Diagnosis not present

## 2016-09-13 DIAGNOSIS — I10 Essential (primary) hypertension: Secondary | ICD-10-CM | POA: Diagnosis not present

## 2016-09-13 DIAGNOSIS — D631 Anemia in chronic kidney disease: Secondary | ICD-10-CM | POA: Diagnosis not present

## 2016-09-13 DIAGNOSIS — S01112A Laceration without foreign body of left eyelid and periocular area, initial encounter: Secondary | ICD-10-CM | POA: Diagnosis not present

## 2016-09-13 DIAGNOSIS — I130 Hypertensive heart and chronic kidney disease with heart failure and stage 1 through stage 4 chronic kidney disease, or unspecified chronic kidney disease: Secondary | ICD-10-CM | POA: Diagnosis not present

## 2016-09-13 DIAGNOSIS — G319 Degenerative disease of nervous system, unspecified: Secondary | ICD-10-CM | POA: Diagnosis not present

## 2016-09-13 DIAGNOSIS — F0280 Dementia in other diseases classified elsewhere without behavioral disturbance: Secondary | ICD-10-CM | POA: Diagnosis not present

## 2016-09-13 DIAGNOSIS — M50323 Other cervical disc degeneration at C6-C7 level: Secondary | ICD-10-CM | POA: Diagnosis not present

## 2016-09-13 DIAGNOSIS — S51011A Laceration without foreign body of right elbow, initial encounter: Secondary | ICD-10-CM | POA: Diagnosis not present

## 2016-09-13 DIAGNOSIS — N183 Chronic kidney disease, stage 3 (moderate): Secondary | ICD-10-CM | POA: Diagnosis not present

## 2016-09-13 DIAGNOSIS — I509 Heart failure, unspecified: Secondary | ICD-10-CM | POA: Diagnosis not present

## 2016-09-13 DIAGNOSIS — G8929 Other chronic pain: Secondary | ICD-10-CM | POA: Diagnosis not present

## 2016-09-13 DIAGNOSIS — Z87891 Personal history of nicotine dependence: Secondary | ICD-10-CM | POA: Diagnosis not present

## 2016-09-14 DIAGNOSIS — R278 Other lack of coordination: Secondary | ICD-10-CM | POA: Diagnosis not present

## 2016-09-14 DIAGNOSIS — M4802 Spinal stenosis, cervical region: Secondary | ICD-10-CM | POA: Diagnosis not present

## 2016-09-14 DIAGNOSIS — F0390 Unspecified dementia without behavioral disturbance: Secondary | ICD-10-CM | POA: Diagnosis not present

## 2016-09-14 DIAGNOSIS — M50322 Other cervical disc degeneration at C5-C6 level: Secondary | ICD-10-CM | POA: Diagnosis not present

## 2016-09-14 DIAGNOSIS — I10 Essential (primary) hypertension: Secondary | ICD-10-CM | POA: Diagnosis not present

## 2016-09-14 DIAGNOSIS — N183 Chronic kidney disease, stage 3 (moderate): Secondary | ICD-10-CM | POA: Diagnosis not present

## 2016-09-14 DIAGNOSIS — E119 Type 2 diabetes mellitus without complications: Secondary | ICD-10-CM | POA: Diagnosis not present

## 2016-09-14 DIAGNOSIS — I5042 Chronic combined systolic (congestive) and diastolic (congestive) heart failure: Secondary | ICD-10-CM | POA: Diagnosis not present

## 2016-09-14 DIAGNOSIS — I6203 Nontraumatic chronic subdural hemorrhage: Secondary | ICD-10-CM | POA: Diagnosis not present

## 2016-09-14 DIAGNOSIS — I6201 Nontraumatic acute subdural hemorrhage: Secondary | ICD-10-CM | POA: Diagnosis not present

## 2016-09-14 DIAGNOSIS — I6200 Nontraumatic subdural hemorrhage, unspecified: Secondary | ICD-10-CM | POA: Diagnosis not present

## 2016-10-01 DIAGNOSIS — B372 Candidiasis of skin and nail: Secondary | ICD-10-CM | POA: Diagnosis not present

## 2016-10-01 DIAGNOSIS — L0390 Cellulitis, unspecified: Secondary | ICD-10-CM | POA: Diagnosis not present

## 2016-10-01 DIAGNOSIS — F0390 Unspecified dementia without behavioral disturbance: Secondary | ICD-10-CM | POA: Diagnosis not present

## 2016-10-08 DIAGNOSIS — F0281 Dementia in other diseases classified elsewhere with behavioral disturbance: Secondary | ICD-10-CM | POA: Diagnosis not present

## 2016-10-08 DIAGNOSIS — R279 Unspecified lack of coordination: Secondary | ICD-10-CM | POA: Diagnosis not present

## 2016-10-08 DIAGNOSIS — R1311 Dysphagia, oral phase: Secondary | ICD-10-CM | POA: Diagnosis not present

## 2016-10-08 DIAGNOSIS — G309 Alzheimer's disease, unspecified: Secondary | ICD-10-CM | POA: Diagnosis not present

## 2016-10-08 DIAGNOSIS — M6281 Muscle weakness (generalized): Secondary | ICD-10-CM | POA: Diagnosis not present

## 2016-10-08 DIAGNOSIS — F0391 Unspecified dementia with behavioral disturbance: Secondary | ICD-10-CM | POA: Diagnosis not present

## 2016-10-08 DIAGNOSIS — L89510 Pressure ulcer of right ankle, unstageable: Secondary | ICD-10-CM | POA: Diagnosis not present

## 2016-10-11 DIAGNOSIS — I10 Essential (primary) hypertension: Secondary | ICD-10-CM | POA: Diagnosis not present

## 2016-10-11 DIAGNOSIS — E11649 Type 2 diabetes mellitus with hypoglycemia without coma: Secondary | ICD-10-CM | POA: Diagnosis not present

## 2016-10-11 DIAGNOSIS — F0390 Unspecified dementia without behavioral disturbance: Secondary | ICD-10-CM | POA: Diagnosis not present

## 2016-10-11 DIAGNOSIS — R4182 Altered mental status, unspecified: Secondary | ICD-10-CM | POA: Diagnosis not present

## 2016-10-11 DIAGNOSIS — E162 Hypoglycemia, unspecified: Secondary | ICD-10-CM | POA: Diagnosis not present

## 2016-10-13 DIAGNOSIS — L89510 Pressure ulcer of right ankle, unstageable: Secondary | ICD-10-CM | POA: Diagnosis not present

## 2016-10-13 DIAGNOSIS — F0281 Dementia in other diseases classified elsewhere with behavioral disturbance: Secondary | ICD-10-CM | POA: Diagnosis not present

## 2016-10-13 DIAGNOSIS — M6281 Muscle weakness (generalized): Secondary | ICD-10-CM | POA: Diagnosis not present

## 2016-10-13 DIAGNOSIS — F0391 Unspecified dementia with behavioral disturbance: Secondary | ICD-10-CM | POA: Diagnosis not present

## 2016-10-13 DIAGNOSIS — G309 Alzheimer's disease, unspecified: Secondary | ICD-10-CM | POA: Diagnosis not present

## 2016-10-13 DIAGNOSIS — R1311 Dysphagia, oral phase: Secondary | ICD-10-CM | POA: Diagnosis not present

## 2016-10-18 DIAGNOSIS — F0391 Unspecified dementia with behavioral disturbance: Secondary | ICD-10-CM | POA: Diagnosis not present

## 2016-10-18 DIAGNOSIS — L89510 Pressure ulcer of right ankle, unstageable: Secondary | ICD-10-CM | POA: Diagnosis not present

## 2016-10-18 DIAGNOSIS — M6281 Muscle weakness (generalized): Secondary | ICD-10-CM | POA: Diagnosis not present

## 2016-10-18 DIAGNOSIS — F0281 Dementia in other diseases classified elsewhere with behavioral disturbance: Secondary | ICD-10-CM | POA: Diagnosis not present

## 2016-10-18 DIAGNOSIS — R1311 Dysphagia, oral phase: Secondary | ICD-10-CM | POA: Diagnosis not present

## 2016-10-18 DIAGNOSIS — G309 Alzheimer's disease, unspecified: Secondary | ICD-10-CM | POA: Diagnosis not present

## 2016-10-19 DIAGNOSIS — M6281 Muscle weakness (generalized): Secondary | ICD-10-CM | POA: Diagnosis not present

## 2016-10-19 DIAGNOSIS — L89510 Pressure ulcer of right ankle, unstageable: Secondary | ICD-10-CM | POA: Diagnosis not present

## 2016-10-19 DIAGNOSIS — R1311 Dysphagia, oral phase: Secondary | ICD-10-CM | POA: Diagnosis not present

## 2016-10-19 DIAGNOSIS — F0281 Dementia in other diseases classified elsewhere with behavioral disturbance: Secondary | ICD-10-CM | POA: Diagnosis not present

## 2016-10-19 DIAGNOSIS — G309 Alzheimer's disease, unspecified: Secondary | ICD-10-CM | POA: Diagnosis not present

## 2016-10-19 DIAGNOSIS — F0391 Unspecified dementia with behavioral disturbance: Secondary | ICD-10-CM | POA: Diagnosis not present

## 2016-10-20 DIAGNOSIS — M6281 Muscle weakness (generalized): Secondary | ICD-10-CM | POA: Diagnosis not present

## 2016-10-20 DIAGNOSIS — R1311 Dysphagia, oral phase: Secondary | ICD-10-CM | POA: Diagnosis not present

## 2016-10-20 DIAGNOSIS — F0391 Unspecified dementia with behavioral disturbance: Secondary | ICD-10-CM | POA: Diagnosis not present

## 2016-10-20 DIAGNOSIS — G309 Alzheimer's disease, unspecified: Secondary | ICD-10-CM | POA: Diagnosis not present

## 2016-10-20 DIAGNOSIS — F0281 Dementia in other diseases classified elsewhere with behavioral disturbance: Secondary | ICD-10-CM | POA: Diagnosis not present

## 2016-10-20 DIAGNOSIS — L89510 Pressure ulcer of right ankle, unstageable: Secondary | ICD-10-CM | POA: Diagnosis not present

## 2016-10-21 DIAGNOSIS — M6281 Muscle weakness (generalized): Secondary | ICD-10-CM | POA: Diagnosis not present

## 2016-10-21 DIAGNOSIS — F0391 Unspecified dementia with behavioral disturbance: Secondary | ICD-10-CM | POA: Diagnosis not present

## 2016-10-21 DIAGNOSIS — G309 Alzheimer's disease, unspecified: Secondary | ICD-10-CM | POA: Diagnosis not present

## 2016-10-21 DIAGNOSIS — R1311 Dysphagia, oral phase: Secondary | ICD-10-CM | POA: Diagnosis not present

## 2016-10-21 DIAGNOSIS — Z515 Encounter for palliative care: Secondary | ICD-10-CM | POA: Diagnosis not present

## 2016-10-21 DIAGNOSIS — I130 Hypertensive heart and chronic kidney disease with heart failure and stage 1 through stage 4 chronic kidney disease, or unspecified chronic kidney disease: Secondary | ICD-10-CM | POA: Diagnosis not present

## 2016-10-21 DIAGNOSIS — Z950 Presence of cardiac pacemaker: Secondary | ICD-10-CM | POA: Diagnosis not present

## 2016-10-21 DIAGNOSIS — S065X0D Traumatic subdural hemorrhage without loss of consciousness, subsequent encounter: Secondary | ICD-10-CM | POA: Diagnosis not present

## 2016-10-21 DIAGNOSIS — N183 Chronic kidney disease, stage 3 (moderate): Secondary | ICD-10-CM | POA: Diagnosis not present

## 2016-10-21 DIAGNOSIS — E1140 Type 2 diabetes mellitus with diabetic neuropathy, unspecified: Secondary | ICD-10-CM | POA: Diagnosis not present

## 2016-10-21 DIAGNOSIS — F0280 Dementia in other diseases classified elsewhere without behavioral disturbance: Secondary | ICD-10-CM | POA: Diagnosis not present

## 2016-10-21 DIAGNOSIS — L89510 Pressure ulcer of right ankle, unstageable: Secondary | ICD-10-CM | POA: Diagnosis not present

## 2016-10-21 DIAGNOSIS — Z9981 Dependence on supplemental oxygen: Secondary | ICD-10-CM | POA: Diagnosis not present

## 2016-10-21 DIAGNOSIS — D631 Anemia in chronic kidney disease: Secondary | ICD-10-CM | POA: Diagnosis not present

## 2016-10-21 DIAGNOSIS — I5042 Chronic combined systolic (congestive) and diastolic (congestive) heart failure: Secondary | ICD-10-CM | POA: Diagnosis not present

## 2016-10-21 DIAGNOSIS — E1122 Type 2 diabetes mellitus with diabetic chronic kidney disease: Secondary | ICD-10-CM | POA: Diagnosis not present

## 2016-10-21 DIAGNOSIS — F0281 Dementia in other diseases classified elsewhere with behavioral disturbance: Secondary | ICD-10-CM | POA: Diagnosis not present

## 2016-10-21 DIAGNOSIS — S51012D Laceration without foreign body of left elbow, subsequent encounter: Secondary | ICD-10-CM | POA: Diagnosis not present

## 2016-10-22 DIAGNOSIS — S065X0D Traumatic subdural hemorrhage without loss of consciousness, subsequent encounter: Secondary | ICD-10-CM | POA: Diagnosis not present

## 2016-10-22 DIAGNOSIS — I130 Hypertensive heart and chronic kidney disease with heart failure and stage 1 through stage 4 chronic kidney disease, or unspecified chronic kidney disease: Secondary | ICD-10-CM | POA: Diagnosis not present

## 2016-10-22 DIAGNOSIS — I5042 Chronic combined systolic (congestive) and diastolic (congestive) heart failure: Secondary | ICD-10-CM | POA: Diagnosis not present

## 2016-10-22 DIAGNOSIS — E1122 Type 2 diabetes mellitus with diabetic chronic kidney disease: Secondary | ICD-10-CM | POA: Diagnosis not present

## 2016-10-22 DIAGNOSIS — G309 Alzheimer's disease, unspecified: Secondary | ICD-10-CM | POA: Diagnosis not present

## 2016-10-22 DIAGNOSIS — F0280 Dementia in other diseases classified elsewhere without behavioral disturbance: Secondary | ICD-10-CM | POA: Diagnosis not present

## 2016-10-23 DIAGNOSIS — E1122 Type 2 diabetes mellitus with diabetic chronic kidney disease: Secondary | ICD-10-CM | POA: Diagnosis not present

## 2016-10-23 DIAGNOSIS — S065X0D Traumatic subdural hemorrhage without loss of consciousness, subsequent encounter: Secondary | ICD-10-CM | POA: Diagnosis not present

## 2016-10-23 DIAGNOSIS — G309 Alzheimer's disease, unspecified: Secondary | ICD-10-CM | POA: Diagnosis not present

## 2016-10-23 DIAGNOSIS — F0280 Dementia in other diseases classified elsewhere without behavioral disturbance: Secondary | ICD-10-CM | POA: Diagnosis not present

## 2016-10-23 DIAGNOSIS — I5042 Chronic combined systolic (congestive) and diastolic (congestive) heart failure: Secondary | ICD-10-CM | POA: Diagnosis not present

## 2016-10-23 DIAGNOSIS — I130 Hypertensive heart and chronic kidney disease with heart failure and stage 1 through stage 4 chronic kidney disease, or unspecified chronic kidney disease: Secondary | ICD-10-CM | POA: Diagnosis not present

## 2016-10-25 DIAGNOSIS — I5042 Chronic combined systolic (congestive) and diastolic (congestive) heart failure: Secondary | ICD-10-CM | POA: Diagnosis not present

## 2016-10-25 DIAGNOSIS — I130 Hypertensive heart and chronic kidney disease with heart failure and stage 1 through stage 4 chronic kidney disease, or unspecified chronic kidney disease: Secondary | ICD-10-CM | POA: Diagnosis not present

## 2016-10-25 DIAGNOSIS — G309 Alzheimer's disease, unspecified: Secondary | ICD-10-CM | POA: Diagnosis not present

## 2016-10-25 DIAGNOSIS — S065X0D Traumatic subdural hemorrhage without loss of consciousness, subsequent encounter: Secondary | ICD-10-CM | POA: Diagnosis not present

## 2016-10-25 DIAGNOSIS — M6281 Muscle weakness (generalized): Secondary | ICD-10-CM | POA: Diagnosis not present

## 2016-10-25 DIAGNOSIS — R1311 Dysphagia, oral phase: Secondary | ICD-10-CM | POA: Diagnosis not present

## 2016-10-25 DIAGNOSIS — F0281 Dementia in other diseases classified elsewhere with behavioral disturbance: Secondary | ICD-10-CM | POA: Diagnosis not present

## 2016-10-25 DIAGNOSIS — L89510 Pressure ulcer of right ankle, unstageable: Secondary | ICD-10-CM | POA: Diagnosis not present

## 2016-10-25 DIAGNOSIS — E1122 Type 2 diabetes mellitus with diabetic chronic kidney disease: Secondary | ICD-10-CM | POA: Diagnosis not present

## 2016-10-25 DIAGNOSIS — F0391 Unspecified dementia with behavioral disturbance: Secondary | ICD-10-CM | POA: Diagnosis not present

## 2016-10-25 DIAGNOSIS — F0280 Dementia in other diseases classified elsewhere without behavioral disturbance: Secondary | ICD-10-CM | POA: Diagnosis not present

## 2016-10-27 DIAGNOSIS — E1122 Type 2 diabetes mellitus with diabetic chronic kidney disease: Secondary | ICD-10-CM | POA: Diagnosis not present

## 2016-10-27 DIAGNOSIS — S065X0D Traumatic subdural hemorrhage without loss of consciousness, subsequent encounter: Secondary | ICD-10-CM | POA: Diagnosis not present

## 2016-10-27 DIAGNOSIS — I5042 Chronic combined systolic (congestive) and diastolic (congestive) heart failure: Secondary | ICD-10-CM | POA: Diagnosis not present

## 2016-10-27 DIAGNOSIS — I130 Hypertensive heart and chronic kidney disease with heart failure and stage 1 through stage 4 chronic kidney disease, or unspecified chronic kidney disease: Secondary | ICD-10-CM | POA: Diagnosis not present

## 2016-10-27 DIAGNOSIS — G309 Alzheimer's disease, unspecified: Secondary | ICD-10-CM | POA: Diagnosis not present

## 2016-10-27 DIAGNOSIS — F0280 Dementia in other diseases classified elsewhere without behavioral disturbance: Secondary | ICD-10-CM | POA: Diagnosis not present

## 2016-10-29 DIAGNOSIS — E1122 Type 2 diabetes mellitus with diabetic chronic kidney disease: Secondary | ICD-10-CM | POA: Diagnosis not present

## 2016-10-29 DIAGNOSIS — F0280 Dementia in other diseases classified elsewhere without behavioral disturbance: Secondary | ICD-10-CM | POA: Diagnosis not present

## 2016-10-29 DIAGNOSIS — I130 Hypertensive heart and chronic kidney disease with heart failure and stage 1 through stage 4 chronic kidney disease, or unspecified chronic kidney disease: Secondary | ICD-10-CM | POA: Diagnosis not present

## 2016-10-29 DIAGNOSIS — I5042 Chronic combined systolic (congestive) and diastolic (congestive) heart failure: Secondary | ICD-10-CM | POA: Diagnosis not present

## 2016-10-29 DIAGNOSIS — S065X0D Traumatic subdural hemorrhage without loss of consciousness, subsequent encounter: Secondary | ICD-10-CM | POA: Diagnosis not present

## 2016-10-29 DIAGNOSIS — G309 Alzheimer's disease, unspecified: Secondary | ICD-10-CM | POA: Diagnosis not present

## 2016-11-01 DIAGNOSIS — E1122 Type 2 diabetes mellitus with diabetic chronic kidney disease: Secondary | ICD-10-CM | POA: Diagnosis not present

## 2016-11-01 DIAGNOSIS — S065X0D Traumatic subdural hemorrhage without loss of consciousness, subsequent encounter: Secondary | ICD-10-CM | POA: Diagnosis not present

## 2016-11-01 DIAGNOSIS — Z950 Presence of cardiac pacemaker: Secondary | ICD-10-CM | POA: Diagnosis not present

## 2016-11-01 DIAGNOSIS — G309 Alzheimer's disease, unspecified: Secondary | ICD-10-CM | POA: Diagnosis not present

## 2016-11-01 DIAGNOSIS — E1140 Type 2 diabetes mellitus with diabetic neuropathy, unspecified: Secondary | ICD-10-CM | POA: Diagnosis not present

## 2016-11-01 DIAGNOSIS — I5042 Chronic combined systolic (congestive) and diastolic (congestive) heart failure: Secondary | ICD-10-CM | POA: Diagnosis not present

## 2016-11-01 DIAGNOSIS — D631 Anemia in chronic kidney disease: Secondary | ICD-10-CM | POA: Diagnosis not present

## 2016-11-01 DIAGNOSIS — N183 Chronic kidney disease, stage 3 (moderate): Secondary | ICD-10-CM | POA: Diagnosis not present

## 2016-11-01 DIAGNOSIS — Z9981 Dependence on supplemental oxygen: Secondary | ICD-10-CM | POA: Diagnosis not present

## 2016-11-01 DIAGNOSIS — F0280 Dementia in other diseases classified elsewhere without behavioral disturbance: Secondary | ICD-10-CM | POA: Diagnosis not present

## 2016-11-01 DIAGNOSIS — S51012D Laceration without foreign body of left elbow, subsequent encounter: Secondary | ICD-10-CM | POA: Diagnosis not present

## 2016-11-01 DIAGNOSIS — Z515 Encounter for palliative care: Secondary | ICD-10-CM | POA: Diagnosis not present

## 2016-11-01 DIAGNOSIS — I130 Hypertensive heart and chronic kidney disease with heart failure and stage 1 through stage 4 chronic kidney disease, or unspecified chronic kidney disease: Secondary | ICD-10-CM | POA: Diagnosis not present

## 2016-11-03 DIAGNOSIS — G309 Alzheimer's disease, unspecified: Secondary | ICD-10-CM | POA: Diagnosis not present

## 2016-11-03 DIAGNOSIS — F0280 Dementia in other diseases classified elsewhere without behavioral disturbance: Secondary | ICD-10-CM | POA: Diagnosis not present

## 2016-11-03 DIAGNOSIS — E1122 Type 2 diabetes mellitus with diabetic chronic kidney disease: Secondary | ICD-10-CM | POA: Diagnosis not present

## 2016-11-03 DIAGNOSIS — I5042 Chronic combined systolic (congestive) and diastolic (congestive) heart failure: Secondary | ICD-10-CM | POA: Diagnosis not present

## 2016-11-03 DIAGNOSIS — S065X0D Traumatic subdural hemorrhage without loss of consciousness, subsequent encounter: Secondary | ICD-10-CM | POA: Diagnosis not present

## 2016-11-03 DIAGNOSIS — I130 Hypertensive heart and chronic kidney disease with heart failure and stage 1 through stage 4 chronic kidney disease, or unspecified chronic kidney disease: Secondary | ICD-10-CM | POA: Diagnosis not present

## 2016-11-08 DIAGNOSIS — I130 Hypertensive heart and chronic kidney disease with heart failure and stage 1 through stage 4 chronic kidney disease, or unspecified chronic kidney disease: Secondary | ICD-10-CM | POA: Diagnosis not present

## 2016-11-08 DIAGNOSIS — F0280 Dementia in other diseases classified elsewhere without behavioral disturbance: Secondary | ICD-10-CM | POA: Diagnosis not present

## 2016-11-08 DIAGNOSIS — G309 Alzheimer's disease, unspecified: Secondary | ICD-10-CM | POA: Diagnosis not present

## 2016-11-08 DIAGNOSIS — S065X0D Traumatic subdural hemorrhage without loss of consciousness, subsequent encounter: Secondary | ICD-10-CM | POA: Diagnosis not present

## 2016-11-08 DIAGNOSIS — I5042 Chronic combined systolic (congestive) and diastolic (congestive) heart failure: Secondary | ICD-10-CM | POA: Diagnosis not present

## 2016-11-08 DIAGNOSIS — E1122 Type 2 diabetes mellitus with diabetic chronic kidney disease: Secondary | ICD-10-CM | POA: Diagnosis not present

## 2016-11-10 DIAGNOSIS — I130 Hypertensive heart and chronic kidney disease with heart failure and stage 1 through stage 4 chronic kidney disease, or unspecified chronic kidney disease: Secondary | ICD-10-CM | POA: Diagnosis not present

## 2016-11-10 DIAGNOSIS — S065X0D Traumatic subdural hemorrhage without loss of consciousness, subsequent encounter: Secondary | ICD-10-CM | POA: Diagnosis not present

## 2016-11-10 DIAGNOSIS — F0280 Dementia in other diseases classified elsewhere without behavioral disturbance: Secondary | ICD-10-CM | POA: Diagnosis not present

## 2016-11-10 DIAGNOSIS — I5042 Chronic combined systolic (congestive) and diastolic (congestive) heart failure: Secondary | ICD-10-CM | POA: Diagnosis not present

## 2016-11-10 DIAGNOSIS — E1122 Type 2 diabetes mellitus with diabetic chronic kidney disease: Secondary | ICD-10-CM | POA: Diagnosis not present

## 2016-11-10 DIAGNOSIS — G309 Alzheimer's disease, unspecified: Secondary | ICD-10-CM | POA: Diagnosis not present

## 2016-11-12 DIAGNOSIS — G40909 Epilepsy, unspecified, not intractable, without status epilepticus: Secondary | ICD-10-CM | POA: Diagnosis not present

## 2016-11-12 DIAGNOSIS — I739 Peripheral vascular disease, unspecified: Secondary | ICD-10-CM | POA: Diagnosis not present

## 2016-11-12 DIAGNOSIS — F0390 Unspecified dementia without behavioral disturbance: Secondary | ICD-10-CM | POA: Diagnosis not present

## 2016-11-12 DIAGNOSIS — F419 Anxiety disorder, unspecified: Secondary | ICD-10-CM | POA: Diagnosis not present

## 2016-11-12 DIAGNOSIS — E119 Type 2 diabetes mellitus without complications: Secondary | ICD-10-CM | POA: Diagnosis not present

## 2016-11-12 DIAGNOSIS — F0630 Mood disorder due to known physiological condition, unspecified: Secondary | ICD-10-CM | POA: Diagnosis not present

## 2016-11-12 DIAGNOSIS — I6203 Nontraumatic chronic subdural hemorrhage: Secondary | ICD-10-CM | POA: Diagnosis not present

## 2016-11-17 DIAGNOSIS — S065X0D Traumatic subdural hemorrhage without loss of consciousness, subsequent encounter: Secondary | ICD-10-CM | POA: Diagnosis not present

## 2016-11-17 DIAGNOSIS — G309 Alzheimer's disease, unspecified: Secondary | ICD-10-CM | POA: Diagnosis not present

## 2016-11-17 DIAGNOSIS — I130 Hypertensive heart and chronic kidney disease with heart failure and stage 1 through stage 4 chronic kidney disease, or unspecified chronic kidney disease: Secondary | ICD-10-CM | POA: Diagnosis not present

## 2016-11-17 DIAGNOSIS — I5042 Chronic combined systolic (congestive) and diastolic (congestive) heart failure: Secondary | ICD-10-CM | POA: Diagnosis not present

## 2016-11-17 DIAGNOSIS — E1122 Type 2 diabetes mellitus with diabetic chronic kidney disease: Secondary | ICD-10-CM | POA: Diagnosis not present

## 2016-11-17 DIAGNOSIS — F0280 Dementia in other diseases classified elsewhere without behavioral disturbance: Secondary | ICD-10-CM | POA: Diagnosis not present

## 2016-11-19 DIAGNOSIS — F0280 Dementia in other diseases classified elsewhere without behavioral disturbance: Secondary | ICD-10-CM | POA: Diagnosis not present

## 2016-11-19 DIAGNOSIS — E1122 Type 2 diabetes mellitus with diabetic chronic kidney disease: Secondary | ICD-10-CM | POA: Diagnosis not present

## 2016-11-19 DIAGNOSIS — I5042 Chronic combined systolic (congestive) and diastolic (congestive) heart failure: Secondary | ICD-10-CM | POA: Diagnosis not present

## 2016-11-19 DIAGNOSIS — S065X0D Traumatic subdural hemorrhage without loss of consciousness, subsequent encounter: Secondary | ICD-10-CM | POA: Diagnosis not present

## 2016-11-19 DIAGNOSIS — G309 Alzheimer's disease, unspecified: Secondary | ICD-10-CM | POA: Diagnosis not present

## 2016-11-19 DIAGNOSIS — I130 Hypertensive heart and chronic kidney disease with heart failure and stage 1 through stage 4 chronic kidney disease, or unspecified chronic kidney disease: Secondary | ICD-10-CM | POA: Diagnosis not present

## 2016-11-22 DIAGNOSIS — F0280 Dementia in other diseases classified elsewhere without behavioral disturbance: Secondary | ICD-10-CM | POA: Diagnosis not present

## 2016-11-22 DIAGNOSIS — E1122 Type 2 diabetes mellitus with diabetic chronic kidney disease: Secondary | ICD-10-CM | POA: Diagnosis not present

## 2016-11-22 DIAGNOSIS — I5042 Chronic combined systolic (congestive) and diastolic (congestive) heart failure: Secondary | ICD-10-CM | POA: Diagnosis not present

## 2016-11-22 DIAGNOSIS — G309 Alzheimer's disease, unspecified: Secondary | ICD-10-CM | POA: Diagnosis not present

## 2016-11-22 DIAGNOSIS — S065X0D Traumatic subdural hemorrhage without loss of consciousness, subsequent encounter: Secondary | ICD-10-CM | POA: Diagnosis not present

## 2016-11-22 DIAGNOSIS — I130 Hypertensive heart and chronic kidney disease with heart failure and stage 1 through stage 4 chronic kidney disease, or unspecified chronic kidney disease: Secondary | ICD-10-CM | POA: Diagnosis not present

## 2016-11-24 DIAGNOSIS — E1122 Type 2 diabetes mellitus with diabetic chronic kidney disease: Secondary | ICD-10-CM | POA: Diagnosis not present

## 2016-11-24 DIAGNOSIS — S065X0D Traumatic subdural hemorrhage without loss of consciousness, subsequent encounter: Secondary | ICD-10-CM | POA: Diagnosis not present

## 2016-11-24 DIAGNOSIS — I5042 Chronic combined systolic (congestive) and diastolic (congestive) heart failure: Secondary | ICD-10-CM | POA: Diagnosis not present

## 2016-11-24 DIAGNOSIS — G309 Alzheimer's disease, unspecified: Secondary | ICD-10-CM | POA: Diagnosis not present

## 2016-11-24 DIAGNOSIS — F0280 Dementia in other diseases classified elsewhere without behavioral disturbance: Secondary | ICD-10-CM | POA: Diagnosis not present

## 2016-11-24 DIAGNOSIS — I130 Hypertensive heart and chronic kidney disease with heart failure and stage 1 through stage 4 chronic kidney disease, or unspecified chronic kidney disease: Secondary | ICD-10-CM | POA: Diagnosis not present

## 2016-11-29 DIAGNOSIS — I130 Hypertensive heart and chronic kidney disease with heart failure and stage 1 through stage 4 chronic kidney disease, or unspecified chronic kidney disease: Secondary | ICD-10-CM | POA: Diagnosis not present

## 2016-11-29 DIAGNOSIS — S065X0D Traumatic subdural hemorrhage without loss of consciousness, subsequent encounter: Secondary | ICD-10-CM | POA: Diagnosis not present

## 2016-11-29 DIAGNOSIS — E1122 Type 2 diabetes mellitus with diabetic chronic kidney disease: Secondary | ICD-10-CM | POA: Diagnosis not present

## 2016-11-29 DIAGNOSIS — F0280 Dementia in other diseases classified elsewhere without behavioral disturbance: Secondary | ICD-10-CM | POA: Diagnosis not present

## 2016-11-29 DIAGNOSIS — I5042 Chronic combined systolic (congestive) and diastolic (congestive) heart failure: Secondary | ICD-10-CM | POA: Diagnosis not present

## 2016-11-29 DIAGNOSIS — G309 Alzheimer's disease, unspecified: Secondary | ICD-10-CM | POA: Diagnosis not present

## 2016-12-01 DIAGNOSIS — I5042 Chronic combined systolic (congestive) and diastolic (congestive) heart failure: Secondary | ICD-10-CM | POA: Diagnosis not present

## 2016-12-01 DIAGNOSIS — I130 Hypertensive heart and chronic kidney disease with heart failure and stage 1 through stage 4 chronic kidney disease, or unspecified chronic kidney disease: Secondary | ICD-10-CM | POA: Diagnosis not present

## 2016-12-01 DIAGNOSIS — S065X0D Traumatic subdural hemorrhage without loss of consciousness, subsequent encounter: Secondary | ICD-10-CM | POA: Diagnosis not present

## 2016-12-01 DIAGNOSIS — G309 Alzheimer's disease, unspecified: Secondary | ICD-10-CM | POA: Diagnosis not present

## 2016-12-01 DIAGNOSIS — F0280 Dementia in other diseases classified elsewhere without behavioral disturbance: Secondary | ICD-10-CM | POA: Diagnosis not present

## 2016-12-01 DIAGNOSIS — E1122 Type 2 diabetes mellitus with diabetic chronic kidney disease: Secondary | ICD-10-CM | POA: Diagnosis not present

## 2016-12-02 DIAGNOSIS — D631 Anemia in chronic kidney disease: Secondary | ICD-10-CM | POA: Diagnosis not present

## 2016-12-02 DIAGNOSIS — G309 Alzheimer's disease, unspecified: Secondary | ICD-10-CM | POA: Diagnosis not present

## 2016-12-02 DIAGNOSIS — Z950 Presence of cardiac pacemaker: Secondary | ICD-10-CM | POA: Diagnosis not present

## 2016-12-02 DIAGNOSIS — S065X0D Traumatic subdural hemorrhage without loss of consciousness, subsequent encounter: Secondary | ICD-10-CM | POA: Diagnosis not present

## 2016-12-02 DIAGNOSIS — N183 Chronic kidney disease, stage 3 (moderate): Secondary | ICD-10-CM | POA: Diagnosis not present

## 2016-12-02 DIAGNOSIS — E1122 Type 2 diabetes mellitus with diabetic chronic kidney disease: Secondary | ICD-10-CM | POA: Diagnosis not present

## 2016-12-02 DIAGNOSIS — I130 Hypertensive heart and chronic kidney disease with heart failure and stage 1 through stage 4 chronic kidney disease, or unspecified chronic kidney disease: Secondary | ICD-10-CM | POA: Diagnosis not present

## 2016-12-02 DIAGNOSIS — F0280 Dementia in other diseases classified elsewhere without behavioral disturbance: Secondary | ICD-10-CM | POA: Diagnosis not present

## 2016-12-02 DIAGNOSIS — E1140 Type 2 diabetes mellitus with diabetic neuropathy, unspecified: Secondary | ICD-10-CM | POA: Diagnosis not present

## 2016-12-02 DIAGNOSIS — Z9981 Dependence on supplemental oxygen: Secondary | ICD-10-CM | POA: Diagnosis not present

## 2016-12-02 DIAGNOSIS — I5042 Chronic combined systolic (congestive) and diastolic (congestive) heart failure: Secondary | ICD-10-CM | POA: Diagnosis not present

## 2016-12-02 DIAGNOSIS — Z515 Encounter for palliative care: Secondary | ICD-10-CM | POA: Diagnosis not present

## 2016-12-02 DIAGNOSIS — S51012D Laceration without foreign body of left elbow, subsequent encounter: Secondary | ICD-10-CM | POA: Diagnosis not present

## 2016-12-07 DIAGNOSIS — F0280 Dementia in other diseases classified elsewhere without behavioral disturbance: Secondary | ICD-10-CM | POA: Diagnosis not present

## 2016-12-07 DIAGNOSIS — G309 Alzheimer's disease, unspecified: Secondary | ICD-10-CM | POA: Diagnosis not present

## 2016-12-07 DIAGNOSIS — I5042 Chronic combined systolic (congestive) and diastolic (congestive) heart failure: Secondary | ICD-10-CM | POA: Diagnosis not present

## 2016-12-07 DIAGNOSIS — E1122 Type 2 diabetes mellitus with diabetic chronic kidney disease: Secondary | ICD-10-CM | POA: Diagnosis not present

## 2016-12-07 DIAGNOSIS — S065X0D Traumatic subdural hemorrhage without loss of consciousness, subsequent encounter: Secondary | ICD-10-CM | POA: Diagnosis not present

## 2016-12-07 DIAGNOSIS — I130 Hypertensive heart and chronic kidney disease with heart failure and stage 1 through stage 4 chronic kidney disease, or unspecified chronic kidney disease: Secondary | ICD-10-CM | POA: Diagnosis not present

## 2016-12-09 DIAGNOSIS — Z23 Encounter for immunization: Secondary | ICD-10-CM | POA: Diagnosis not present

## 2016-12-10 DIAGNOSIS — S065X0D Traumatic subdural hemorrhage without loss of consciousness, subsequent encounter: Secondary | ICD-10-CM | POA: Diagnosis not present

## 2016-12-10 DIAGNOSIS — I5042 Chronic combined systolic (congestive) and diastolic (congestive) heart failure: Secondary | ICD-10-CM | POA: Diagnosis not present

## 2016-12-10 DIAGNOSIS — F0280 Dementia in other diseases classified elsewhere without behavioral disturbance: Secondary | ICD-10-CM | POA: Diagnosis not present

## 2016-12-10 DIAGNOSIS — G309 Alzheimer's disease, unspecified: Secondary | ICD-10-CM | POA: Diagnosis not present

## 2016-12-10 DIAGNOSIS — E1122 Type 2 diabetes mellitus with diabetic chronic kidney disease: Secondary | ICD-10-CM | POA: Diagnosis not present

## 2016-12-10 DIAGNOSIS — I130 Hypertensive heart and chronic kidney disease with heart failure and stage 1 through stage 4 chronic kidney disease, or unspecified chronic kidney disease: Secondary | ICD-10-CM | POA: Diagnosis not present

## 2016-12-13 DIAGNOSIS — F0280 Dementia in other diseases classified elsewhere without behavioral disturbance: Secondary | ICD-10-CM | POA: Diagnosis not present

## 2016-12-13 DIAGNOSIS — S065X0D Traumatic subdural hemorrhage without loss of consciousness, subsequent encounter: Secondary | ICD-10-CM | POA: Diagnosis not present

## 2016-12-13 DIAGNOSIS — E1122 Type 2 diabetes mellitus with diabetic chronic kidney disease: Secondary | ICD-10-CM | POA: Diagnosis not present

## 2016-12-13 DIAGNOSIS — I5042 Chronic combined systolic (congestive) and diastolic (congestive) heart failure: Secondary | ICD-10-CM | POA: Diagnosis not present

## 2016-12-13 DIAGNOSIS — G309 Alzheimer's disease, unspecified: Secondary | ICD-10-CM | POA: Diagnosis not present

## 2016-12-13 DIAGNOSIS — I130 Hypertensive heart and chronic kidney disease with heart failure and stage 1 through stage 4 chronic kidney disease, or unspecified chronic kidney disease: Secondary | ICD-10-CM | POA: Diagnosis not present

## 2016-12-14 DIAGNOSIS — E119 Type 2 diabetes mellitus without complications: Secondary | ICD-10-CM | POA: Diagnosis not present

## 2016-12-14 DIAGNOSIS — I779 Disorder of arteries and arterioles, unspecified: Secondary | ICD-10-CM | POA: Diagnosis not present

## 2016-12-14 DIAGNOSIS — I6203 Nontraumatic chronic subdural hemorrhage: Secondary | ICD-10-CM | POA: Diagnosis not present

## 2016-12-14 DIAGNOSIS — G40309 Generalized idiopathic epilepsy and epileptic syndromes, not intractable, without status epilepticus: Secondary | ICD-10-CM | POA: Diagnosis not present

## 2016-12-17 DIAGNOSIS — G309 Alzheimer's disease, unspecified: Secondary | ICD-10-CM | POA: Diagnosis not present

## 2016-12-17 DIAGNOSIS — S065X0D Traumatic subdural hemorrhage without loss of consciousness, subsequent encounter: Secondary | ICD-10-CM | POA: Diagnosis not present

## 2016-12-17 DIAGNOSIS — I5042 Chronic combined systolic (congestive) and diastolic (congestive) heart failure: Secondary | ICD-10-CM | POA: Diagnosis not present

## 2016-12-17 DIAGNOSIS — F0280 Dementia in other diseases classified elsewhere without behavioral disturbance: Secondary | ICD-10-CM | POA: Diagnosis not present

## 2016-12-17 DIAGNOSIS — E1122 Type 2 diabetes mellitus with diabetic chronic kidney disease: Secondary | ICD-10-CM | POA: Diagnosis not present

## 2016-12-17 DIAGNOSIS — I130 Hypertensive heart and chronic kidney disease with heart failure and stage 1 through stage 4 chronic kidney disease, or unspecified chronic kidney disease: Secondary | ICD-10-CM | POA: Diagnosis not present

## 2016-12-20 DIAGNOSIS — E1122 Type 2 diabetes mellitus with diabetic chronic kidney disease: Secondary | ICD-10-CM | POA: Diagnosis not present

## 2016-12-20 DIAGNOSIS — G309 Alzheimer's disease, unspecified: Secondary | ICD-10-CM | POA: Diagnosis not present

## 2016-12-20 DIAGNOSIS — I130 Hypertensive heart and chronic kidney disease with heart failure and stage 1 through stage 4 chronic kidney disease, or unspecified chronic kidney disease: Secondary | ICD-10-CM | POA: Diagnosis not present

## 2016-12-20 DIAGNOSIS — I5042 Chronic combined systolic (congestive) and diastolic (congestive) heart failure: Secondary | ICD-10-CM | POA: Diagnosis not present

## 2016-12-20 DIAGNOSIS — S065X0D Traumatic subdural hemorrhage without loss of consciousness, subsequent encounter: Secondary | ICD-10-CM | POA: Diagnosis not present

## 2016-12-20 DIAGNOSIS — F0280 Dementia in other diseases classified elsewhere without behavioral disturbance: Secondary | ICD-10-CM | POA: Diagnosis not present

## 2016-12-24 DIAGNOSIS — F0280 Dementia in other diseases classified elsewhere without behavioral disturbance: Secondary | ICD-10-CM | POA: Diagnosis not present

## 2016-12-24 DIAGNOSIS — I130 Hypertensive heart and chronic kidney disease with heart failure and stage 1 through stage 4 chronic kidney disease, or unspecified chronic kidney disease: Secondary | ICD-10-CM | POA: Diagnosis not present

## 2016-12-24 DIAGNOSIS — S065X0D Traumatic subdural hemorrhage without loss of consciousness, subsequent encounter: Secondary | ICD-10-CM | POA: Diagnosis not present

## 2016-12-24 DIAGNOSIS — G309 Alzheimer's disease, unspecified: Secondary | ICD-10-CM | POA: Diagnosis not present

## 2016-12-24 DIAGNOSIS — E1122 Type 2 diabetes mellitus with diabetic chronic kidney disease: Secondary | ICD-10-CM | POA: Diagnosis not present

## 2016-12-24 DIAGNOSIS — I5042 Chronic combined systolic (congestive) and diastolic (congestive) heart failure: Secondary | ICD-10-CM | POA: Diagnosis not present

## 2016-12-25 DIAGNOSIS — F0280 Dementia in other diseases classified elsewhere without behavioral disturbance: Secondary | ICD-10-CM | POA: Diagnosis not present

## 2016-12-25 DIAGNOSIS — E1122 Type 2 diabetes mellitus with diabetic chronic kidney disease: Secondary | ICD-10-CM | POA: Diagnosis not present

## 2016-12-25 DIAGNOSIS — G309 Alzheimer's disease, unspecified: Secondary | ICD-10-CM | POA: Diagnosis not present

## 2016-12-25 DIAGNOSIS — I130 Hypertensive heart and chronic kidney disease with heart failure and stage 1 through stage 4 chronic kidney disease, or unspecified chronic kidney disease: Secondary | ICD-10-CM | POA: Diagnosis not present

## 2016-12-25 DIAGNOSIS — I5042 Chronic combined systolic (congestive) and diastolic (congestive) heart failure: Secondary | ICD-10-CM | POA: Diagnosis not present

## 2016-12-25 DIAGNOSIS — S065X0D Traumatic subdural hemorrhage without loss of consciousness, subsequent encounter: Secondary | ICD-10-CM | POA: Diagnosis not present

## 2016-12-30 DIAGNOSIS — S065X0D Traumatic subdural hemorrhage without loss of consciousness, subsequent encounter: Secondary | ICD-10-CM | POA: Diagnosis not present

## 2016-12-30 DIAGNOSIS — I130 Hypertensive heart and chronic kidney disease with heart failure and stage 1 through stage 4 chronic kidney disease, or unspecified chronic kidney disease: Secondary | ICD-10-CM | POA: Diagnosis not present

## 2016-12-30 DIAGNOSIS — G309 Alzheimer's disease, unspecified: Secondary | ICD-10-CM | POA: Diagnosis not present

## 2016-12-30 DIAGNOSIS — I5042 Chronic combined systolic (congestive) and diastolic (congestive) heart failure: Secondary | ICD-10-CM | POA: Diagnosis not present

## 2016-12-30 DIAGNOSIS — F0280 Dementia in other diseases classified elsewhere without behavioral disturbance: Secondary | ICD-10-CM | POA: Diagnosis not present

## 2016-12-30 DIAGNOSIS — E1122 Type 2 diabetes mellitus with diabetic chronic kidney disease: Secondary | ICD-10-CM | POA: Diagnosis not present

## 2017-01-01 DIAGNOSIS — Z950 Presence of cardiac pacemaker: Secondary | ICD-10-CM | POA: Diagnosis not present

## 2017-01-01 DIAGNOSIS — G309 Alzheimer's disease, unspecified: Secondary | ICD-10-CM | POA: Diagnosis not present

## 2017-01-01 DIAGNOSIS — I130 Hypertensive heart and chronic kidney disease with heart failure and stage 1 through stage 4 chronic kidney disease, or unspecified chronic kidney disease: Secondary | ICD-10-CM | POA: Diagnosis not present

## 2017-01-01 DIAGNOSIS — E1122 Type 2 diabetes mellitus with diabetic chronic kidney disease: Secondary | ICD-10-CM | POA: Diagnosis not present

## 2017-01-01 DIAGNOSIS — Z515 Encounter for palliative care: Secondary | ICD-10-CM | POA: Diagnosis not present

## 2017-01-01 DIAGNOSIS — S51012D Laceration without foreign body of left elbow, subsequent encounter: Secondary | ICD-10-CM | POA: Diagnosis not present

## 2017-01-01 DIAGNOSIS — S065X0D Traumatic subdural hemorrhage without loss of consciousness, subsequent encounter: Secondary | ICD-10-CM | POA: Diagnosis not present

## 2017-01-01 DIAGNOSIS — Z9981 Dependence on supplemental oxygen: Secondary | ICD-10-CM | POA: Diagnosis not present

## 2017-01-01 DIAGNOSIS — N183 Chronic kidney disease, stage 3 (moderate): Secondary | ICD-10-CM | POA: Diagnosis not present

## 2017-01-01 DIAGNOSIS — D631 Anemia in chronic kidney disease: Secondary | ICD-10-CM | POA: Diagnosis not present

## 2017-01-01 DIAGNOSIS — F0280 Dementia in other diseases classified elsewhere without behavioral disturbance: Secondary | ICD-10-CM | POA: Diagnosis not present

## 2017-01-01 DIAGNOSIS — I5042 Chronic combined systolic (congestive) and diastolic (congestive) heart failure: Secondary | ICD-10-CM | POA: Diagnosis not present

## 2017-01-01 DIAGNOSIS — E1140 Type 2 diabetes mellitus with diabetic neuropathy, unspecified: Secondary | ICD-10-CM | POA: Diagnosis not present

## 2017-01-03 DIAGNOSIS — I130 Hypertensive heart and chronic kidney disease with heart failure and stage 1 through stage 4 chronic kidney disease, or unspecified chronic kidney disease: Secondary | ICD-10-CM | POA: Diagnosis not present

## 2017-01-03 DIAGNOSIS — S065X0D Traumatic subdural hemorrhage without loss of consciousness, subsequent encounter: Secondary | ICD-10-CM | POA: Diagnosis not present

## 2017-01-03 DIAGNOSIS — E1122 Type 2 diabetes mellitus with diabetic chronic kidney disease: Secondary | ICD-10-CM | POA: Diagnosis not present

## 2017-01-03 DIAGNOSIS — F0280 Dementia in other diseases classified elsewhere without behavioral disturbance: Secondary | ICD-10-CM | POA: Diagnosis not present

## 2017-01-03 DIAGNOSIS — G309 Alzheimer's disease, unspecified: Secondary | ICD-10-CM | POA: Diagnosis not present

## 2017-01-03 DIAGNOSIS — I5042 Chronic combined systolic (congestive) and diastolic (congestive) heart failure: Secondary | ICD-10-CM | POA: Diagnosis not present

## 2017-01-06 DIAGNOSIS — G40309 Generalized idiopathic epilepsy and epileptic syndromes, not intractable, without status epilepticus: Secondary | ICD-10-CM | POA: Diagnosis not present

## 2017-01-06 DIAGNOSIS — S065X0D Traumatic subdural hemorrhage without loss of consciousness, subsequent encounter: Secondary | ICD-10-CM | POA: Diagnosis not present

## 2017-01-06 DIAGNOSIS — F0390 Unspecified dementia without behavioral disturbance: Secondary | ICD-10-CM | POA: Diagnosis not present

## 2017-01-06 DIAGNOSIS — I5042 Chronic combined systolic (congestive) and diastolic (congestive) heart failure: Secondary | ICD-10-CM | POA: Diagnosis not present

## 2017-01-07 DIAGNOSIS — S065X0D Traumatic subdural hemorrhage without loss of consciousness, subsequent encounter: Secondary | ICD-10-CM | POA: Diagnosis not present

## 2017-01-07 DIAGNOSIS — E1122 Type 2 diabetes mellitus with diabetic chronic kidney disease: Secondary | ICD-10-CM | POA: Diagnosis not present

## 2017-01-07 DIAGNOSIS — I5042 Chronic combined systolic (congestive) and diastolic (congestive) heart failure: Secondary | ICD-10-CM | POA: Diagnosis not present

## 2017-01-07 DIAGNOSIS — F0280 Dementia in other diseases classified elsewhere without behavioral disturbance: Secondary | ICD-10-CM | POA: Diagnosis not present

## 2017-01-07 DIAGNOSIS — G309 Alzheimer's disease, unspecified: Secondary | ICD-10-CM | POA: Diagnosis not present

## 2017-01-07 DIAGNOSIS — I130 Hypertensive heart and chronic kidney disease with heart failure and stage 1 through stage 4 chronic kidney disease, or unspecified chronic kidney disease: Secondary | ICD-10-CM | POA: Diagnosis not present

## 2017-01-15 DIAGNOSIS — F0280 Dementia in other diseases classified elsewhere without behavioral disturbance: Secondary | ICD-10-CM | POA: Diagnosis not present

## 2017-01-15 DIAGNOSIS — G309 Alzheimer's disease, unspecified: Secondary | ICD-10-CM | POA: Diagnosis not present

## 2017-01-15 DIAGNOSIS — I5042 Chronic combined systolic (congestive) and diastolic (congestive) heart failure: Secondary | ICD-10-CM | POA: Diagnosis not present

## 2017-01-15 DIAGNOSIS — I130 Hypertensive heart and chronic kidney disease with heart failure and stage 1 through stage 4 chronic kidney disease, or unspecified chronic kidney disease: Secondary | ICD-10-CM | POA: Diagnosis not present

## 2017-01-15 DIAGNOSIS — S065X0D Traumatic subdural hemorrhage without loss of consciousness, subsequent encounter: Secondary | ICD-10-CM | POA: Diagnosis not present

## 2017-01-15 DIAGNOSIS — E1122 Type 2 diabetes mellitus with diabetic chronic kidney disease: Secondary | ICD-10-CM | POA: Diagnosis not present

## 2017-01-22 DIAGNOSIS — F0280 Dementia in other diseases classified elsewhere without behavioral disturbance: Secondary | ICD-10-CM | POA: Diagnosis not present

## 2017-01-22 DIAGNOSIS — S065X0D Traumatic subdural hemorrhage without loss of consciousness, subsequent encounter: Secondary | ICD-10-CM | POA: Diagnosis not present

## 2017-01-22 DIAGNOSIS — I5042 Chronic combined systolic (congestive) and diastolic (congestive) heart failure: Secondary | ICD-10-CM | POA: Diagnosis not present

## 2017-01-22 DIAGNOSIS — E1122 Type 2 diabetes mellitus with diabetic chronic kidney disease: Secondary | ICD-10-CM | POA: Diagnosis not present

## 2017-01-22 DIAGNOSIS — I130 Hypertensive heart and chronic kidney disease with heart failure and stage 1 through stage 4 chronic kidney disease, or unspecified chronic kidney disease: Secondary | ICD-10-CM | POA: Diagnosis not present

## 2017-01-22 DIAGNOSIS — G309 Alzheimer's disease, unspecified: Secondary | ICD-10-CM | POA: Diagnosis not present

## 2017-01-28 DIAGNOSIS — G309 Alzheimer's disease, unspecified: Secondary | ICD-10-CM | POA: Diagnosis not present

## 2017-01-28 DIAGNOSIS — I130 Hypertensive heart and chronic kidney disease with heart failure and stage 1 through stage 4 chronic kidney disease, or unspecified chronic kidney disease: Secondary | ICD-10-CM | POA: Diagnosis not present

## 2017-01-28 DIAGNOSIS — F0280 Dementia in other diseases classified elsewhere without behavioral disturbance: Secondary | ICD-10-CM | POA: Diagnosis not present

## 2017-01-28 DIAGNOSIS — E1122 Type 2 diabetes mellitus with diabetic chronic kidney disease: Secondary | ICD-10-CM | POA: Diagnosis not present

## 2017-01-28 DIAGNOSIS — I5042 Chronic combined systolic (congestive) and diastolic (congestive) heart failure: Secondary | ICD-10-CM | POA: Diagnosis not present

## 2017-01-28 DIAGNOSIS — S065X0D Traumatic subdural hemorrhage without loss of consciousness, subsequent encounter: Secondary | ICD-10-CM | POA: Diagnosis not present

## 2017-01-29 DIAGNOSIS — S065X0D Traumatic subdural hemorrhage without loss of consciousness, subsequent encounter: Secondary | ICD-10-CM | POA: Diagnosis not present

## 2017-01-29 DIAGNOSIS — G309 Alzheimer's disease, unspecified: Secondary | ICD-10-CM | POA: Diagnosis not present

## 2017-01-29 DIAGNOSIS — E1122 Type 2 diabetes mellitus with diabetic chronic kidney disease: Secondary | ICD-10-CM | POA: Diagnosis not present

## 2017-01-29 DIAGNOSIS — I5042 Chronic combined systolic (congestive) and diastolic (congestive) heart failure: Secondary | ICD-10-CM | POA: Diagnosis not present

## 2017-01-29 DIAGNOSIS — I130 Hypertensive heart and chronic kidney disease with heart failure and stage 1 through stage 4 chronic kidney disease, or unspecified chronic kidney disease: Secondary | ICD-10-CM | POA: Diagnosis not present

## 2017-01-29 DIAGNOSIS — F0280 Dementia in other diseases classified elsewhere without behavioral disturbance: Secondary | ICD-10-CM | POA: Diagnosis not present

## 2017-02-01 DIAGNOSIS — F0280 Dementia in other diseases classified elsewhere without behavioral disturbance: Secondary | ICD-10-CM | POA: Diagnosis not present

## 2017-02-01 DIAGNOSIS — E1140 Type 2 diabetes mellitus with diabetic neuropathy, unspecified: Secondary | ICD-10-CM | POA: Diagnosis not present

## 2017-02-01 DIAGNOSIS — Z950 Presence of cardiac pacemaker: Secondary | ICD-10-CM | POA: Diagnosis not present

## 2017-02-01 DIAGNOSIS — I5042 Chronic combined systolic (congestive) and diastolic (congestive) heart failure: Secondary | ICD-10-CM | POA: Diagnosis not present

## 2017-02-01 DIAGNOSIS — E1122 Type 2 diabetes mellitus with diabetic chronic kidney disease: Secondary | ICD-10-CM | POA: Diagnosis not present

## 2017-02-01 DIAGNOSIS — S065X0D Traumatic subdural hemorrhage without loss of consciousness, subsequent encounter: Secondary | ICD-10-CM | POA: Diagnosis not present

## 2017-02-01 DIAGNOSIS — I130 Hypertensive heart and chronic kidney disease with heart failure and stage 1 through stage 4 chronic kidney disease, or unspecified chronic kidney disease: Secondary | ICD-10-CM | POA: Diagnosis not present

## 2017-02-01 DIAGNOSIS — D631 Anemia in chronic kidney disease: Secondary | ICD-10-CM | POA: Diagnosis not present

## 2017-02-01 DIAGNOSIS — G309 Alzheimer's disease, unspecified: Secondary | ICD-10-CM | POA: Diagnosis not present

## 2017-02-01 DIAGNOSIS — N183 Chronic kidney disease, stage 3 (moderate): Secondary | ICD-10-CM | POA: Diagnosis not present

## 2017-02-01 DIAGNOSIS — Z9981 Dependence on supplemental oxygen: Secondary | ICD-10-CM | POA: Diagnosis not present

## 2017-02-01 DIAGNOSIS — S51012D Laceration without foreign body of left elbow, subsequent encounter: Secondary | ICD-10-CM | POA: Diagnosis not present

## 2017-02-01 DIAGNOSIS — Z515 Encounter for palliative care: Secondary | ICD-10-CM | POA: Diagnosis not present

## 2017-02-04 DIAGNOSIS — G309 Alzheimer's disease, unspecified: Secondary | ICD-10-CM | POA: Diagnosis not present

## 2017-02-04 DIAGNOSIS — F0280 Dementia in other diseases classified elsewhere without behavioral disturbance: Secondary | ICD-10-CM | POA: Diagnosis not present

## 2017-02-04 DIAGNOSIS — S065X0D Traumatic subdural hemorrhage without loss of consciousness, subsequent encounter: Secondary | ICD-10-CM | POA: Diagnosis not present

## 2017-02-04 DIAGNOSIS — I130 Hypertensive heart and chronic kidney disease with heart failure and stage 1 through stage 4 chronic kidney disease, or unspecified chronic kidney disease: Secondary | ICD-10-CM | POA: Diagnosis not present

## 2017-02-04 DIAGNOSIS — E1122 Type 2 diabetes mellitus with diabetic chronic kidney disease: Secondary | ICD-10-CM | POA: Diagnosis not present

## 2017-02-04 DIAGNOSIS — I5042 Chronic combined systolic (congestive) and diastolic (congestive) heart failure: Secondary | ICD-10-CM | POA: Diagnosis not present

## 2017-02-05 DIAGNOSIS — I5042 Chronic combined systolic (congestive) and diastolic (congestive) heart failure: Secondary | ICD-10-CM | POA: Diagnosis not present

## 2017-02-05 DIAGNOSIS — G309 Alzheimer's disease, unspecified: Secondary | ICD-10-CM | POA: Diagnosis not present

## 2017-02-05 DIAGNOSIS — F0280 Dementia in other diseases classified elsewhere without behavioral disturbance: Secondary | ICD-10-CM | POA: Diagnosis not present

## 2017-02-05 DIAGNOSIS — I130 Hypertensive heart and chronic kidney disease with heart failure and stage 1 through stage 4 chronic kidney disease, or unspecified chronic kidney disease: Secondary | ICD-10-CM | POA: Diagnosis not present

## 2017-02-05 DIAGNOSIS — S065X0D Traumatic subdural hemorrhage without loss of consciousness, subsequent encounter: Secondary | ICD-10-CM | POA: Diagnosis not present

## 2017-02-05 DIAGNOSIS — E1122 Type 2 diabetes mellitus with diabetic chronic kidney disease: Secondary | ICD-10-CM | POA: Diagnosis not present

## 2017-02-10 DIAGNOSIS — I5042 Chronic combined systolic (congestive) and diastolic (congestive) heart failure: Secondary | ICD-10-CM | POA: Diagnosis not present

## 2017-02-10 DIAGNOSIS — I130 Hypertensive heart and chronic kidney disease with heart failure and stage 1 through stage 4 chronic kidney disease, or unspecified chronic kidney disease: Secondary | ICD-10-CM | POA: Diagnosis not present

## 2017-02-10 DIAGNOSIS — G309 Alzheimer's disease, unspecified: Secondary | ICD-10-CM | POA: Diagnosis not present

## 2017-02-10 DIAGNOSIS — S065X0D Traumatic subdural hemorrhage without loss of consciousness, subsequent encounter: Secondary | ICD-10-CM | POA: Diagnosis not present

## 2017-02-10 DIAGNOSIS — E1122 Type 2 diabetes mellitus with diabetic chronic kidney disease: Secondary | ICD-10-CM | POA: Diagnosis not present

## 2017-02-10 DIAGNOSIS — F0280 Dementia in other diseases classified elsewhere without behavioral disturbance: Secondary | ICD-10-CM | POA: Diagnosis not present

## 2017-02-17 DIAGNOSIS — G309 Alzheimer's disease, unspecified: Secondary | ICD-10-CM | POA: Diagnosis not present

## 2017-02-17 DIAGNOSIS — S065X0D Traumatic subdural hemorrhage without loss of consciousness, subsequent encounter: Secondary | ICD-10-CM | POA: Diagnosis not present

## 2017-02-17 DIAGNOSIS — I5042 Chronic combined systolic (congestive) and diastolic (congestive) heart failure: Secondary | ICD-10-CM | POA: Diagnosis not present

## 2017-02-17 DIAGNOSIS — E1122 Type 2 diabetes mellitus with diabetic chronic kidney disease: Secondary | ICD-10-CM | POA: Diagnosis not present

## 2017-02-17 DIAGNOSIS — F0280 Dementia in other diseases classified elsewhere without behavioral disturbance: Secondary | ICD-10-CM | POA: Diagnosis not present

## 2017-02-17 DIAGNOSIS — I130 Hypertensive heart and chronic kidney disease with heart failure and stage 1 through stage 4 chronic kidney disease, or unspecified chronic kidney disease: Secondary | ICD-10-CM | POA: Diagnosis not present

## 2017-02-24 DIAGNOSIS — G309 Alzheimer's disease, unspecified: Secondary | ICD-10-CM | POA: Diagnosis not present

## 2017-02-24 DIAGNOSIS — E1122 Type 2 diabetes mellitus with diabetic chronic kidney disease: Secondary | ICD-10-CM | POA: Diagnosis not present

## 2017-02-24 DIAGNOSIS — I130 Hypertensive heart and chronic kidney disease with heart failure and stage 1 through stage 4 chronic kidney disease, or unspecified chronic kidney disease: Secondary | ICD-10-CM | POA: Diagnosis not present

## 2017-02-24 DIAGNOSIS — F0280 Dementia in other diseases classified elsewhere without behavioral disturbance: Secondary | ICD-10-CM | POA: Diagnosis not present

## 2017-02-24 DIAGNOSIS — S065X0D Traumatic subdural hemorrhage without loss of consciousness, subsequent encounter: Secondary | ICD-10-CM | POA: Diagnosis not present

## 2017-02-24 DIAGNOSIS — I5042 Chronic combined systolic (congestive) and diastolic (congestive) heart failure: Secondary | ICD-10-CM | POA: Diagnosis not present

## 2017-02-25 DIAGNOSIS — I130 Hypertensive heart and chronic kidney disease with heart failure and stage 1 through stage 4 chronic kidney disease, or unspecified chronic kidney disease: Secondary | ICD-10-CM | POA: Diagnosis not present

## 2017-02-25 DIAGNOSIS — G309 Alzheimer's disease, unspecified: Secondary | ICD-10-CM | POA: Diagnosis not present

## 2017-02-25 DIAGNOSIS — I5042 Chronic combined systolic (congestive) and diastolic (congestive) heart failure: Secondary | ICD-10-CM | POA: Diagnosis not present

## 2017-02-25 DIAGNOSIS — S065X0D Traumatic subdural hemorrhage without loss of consciousness, subsequent encounter: Secondary | ICD-10-CM | POA: Diagnosis not present

## 2017-02-25 DIAGNOSIS — F0280 Dementia in other diseases classified elsewhere without behavioral disturbance: Secondary | ICD-10-CM | POA: Diagnosis not present

## 2017-02-25 DIAGNOSIS — E1122 Type 2 diabetes mellitus with diabetic chronic kidney disease: Secondary | ICD-10-CM | POA: Diagnosis not present

## 2017-03-02 DIAGNOSIS — E1122 Type 2 diabetes mellitus with diabetic chronic kidney disease: Secondary | ICD-10-CM | POA: Diagnosis not present

## 2017-03-02 DIAGNOSIS — G309 Alzheimer's disease, unspecified: Secondary | ICD-10-CM | POA: Diagnosis not present

## 2017-03-02 DIAGNOSIS — S065X0D Traumatic subdural hemorrhage without loss of consciousness, subsequent encounter: Secondary | ICD-10-CM | POA: Diagnosis not present

## 2017-03-02 DIAGNOSIS — I130 Hypertensive heart and chronic kidney disease with heart failure and stage 1 through stage 4 chronic kidney disease, or unspecified chronic kidney disease: Secondary | ICD-10-CM | POA: Diagnosis not present

## 2017-03-02 DIAGNOSIS — F0280 Dementia in other diseases classified elsewhere without behavioral disturbance: Secondary | ICD-10-CM | POA: Diagnosis not present

## 2017-03-02 DIAGNOSIS — I5042 Chronic combined systolic (congestive) and diastolic (congestive) heart failure: Secondary | ICD-10-CM | POA: Diagnosis not present

## 2017-03-03 DIAGNOSIS — I130 Hypertensive heart and chronic kidney disease with heart failure and stage 1 through stage 4 chronic kidney disease, or unspecified chronic kidney disease: Secondary | ICD-10-CM | POA: Diagnosis not present

## 2017-03-03 DIAGNOSIS — I5042 Chronic combined systolic (congestive) and diastolic (congestive) heart failure: Secondary | ICD-10-CM | POA: Diagnosis not present

## 2017-03-03 DIAGNOSIS — F0280 Dementia in other diseases classified elsewhere without behavioral disturbance: Secondary | ICD-10-CM | POA: Diagnosis not present

## 2017-03-03 DIAGNOSIS — E1122 Type 2 diabetes mellitus with diabetic chronic kidney disease: Secondary | ICD-10-CM | POA: Diagnosis not present

## 2017-03-03 DIAGNOSIS — G309 Alzheimer's disease, unspecified: Secondary | ICD-10-CM | POA: Diagnosis not present

## 2017-03-03 DIAGNOSIS — S065X0D Traumatic subdural hemorrhage without loss of consciousness, subsequent encounter: Secondary | ICD-10-CM | POA: Diagnosis not present

## 2017-03-04 DIAGNOSIS — Z9981 Dependence on supplemental oxygen: Secondary | ICD-10-CM | POA: Diagnosis not present

## 2017-03-04 DIAGNOSIS — S065X0D Traumatic subdural hemorrhage without loss of consciousness, subsequent encounter: Secondary | ICD-10-CM | POA: Diagnosis not present

## 2017-03-04 DIAGNOSIS — Z515 Encounter for palliative care: Secondary | ICD-10-CM | POA: Diagnosis not present

## 2017-03-04 DIAGNOSIS — F0280 Dementia in other diseases classified elsewhere without behavioral disturbance: Secondary | ICD-10-CM | POA: Diagnosis not present

## 2017-03-04 DIAGNOSIS — I5042 Chronic combined systolic (congestive) and diastolic (congestive) heart failure: Secondary | ICD-10-CM | POA: Diagnosis not present

## 2017-03-04 DIAGNOSIS — D631 Anemia in chronic kidney disease: Secondary | ICD-10-CM | POA: Diagnosis not present

## 2017-03-04 DIAGNOSIS — E1122 Type 2 diabetes mellitus with diabetic chronic kidney disease: Secondary | ICD-10-CM | POA: Diagnosis not present

## 2017-03-04 DIAGNOSIS — G309 Alzheimer's disease, unspecified: Secondary | ICD-10-CM | POA: Diagnosis not present

## 2017-03-04 DIAGNOSIS — E1140 Type 2 diabetes mellitus with diabetic neuropathy, unspecified: Secondary | ICD-10-CM | POA: Diagnosis not present

## 2017-03-04 DIAGNOSIS — N183 Chronic kidney disease, stage 3 (moderate): Secondary | ICD-10-CM | POA: Diagnosis not present

## 2017-03-04 DIAGNOSIS — S51012D Laceration without foreign body of left elbow, subsequent encounter: Secondary | ICD-10-CM | POA: Diagnosis not present

## 2017-03-04 DIAGNOSIS — Z950 Presence of cardiac pacemaker: Secondary | ICD-10-CM | POA: Diagnosis not present

## 2017-03-04 DIAGNOSIS — I130 Hypertensive heart and chronic kidney disease with heart failure and stage 1 through stage 4 chronic kidney disease, or unspecified chronic kidney disease: Secondary | ICD-10-CM | POA: Diagnosis not present

## 2017-03-08 DIAGNOSIS — E1122 Type 2 diabetes mellitus with diabetic chronic kidney disease: Secondary | ICD-10-CM | POA: Diagnosis not present

## 2017-03-08 DIAGNOSIS — G309 Alzheimer's disease, unspecified: Secondary | ICD-10-CM | POA: Diagnosis not present

## 2017-03-08 DIAGNOSIS — S065X0D Traumatic subdural hemorrhage without loss of consciousness, subsequent encounter: Secondary | ICD-10-CM | POA: Diagnosis not present

## 2017-03-08 DIAGNOSIS — I130 Hypertensive heart and chronic kidney disease with heart failure and stage 1 through stage 4 chronic kidney disease, or unspecified chronic kidney disease: Secondary | ICD-10-CM | POA: Diagnosis not present

## 2017-03-08 DIAGNOSIS — F0280 Dementia in other diseases classified elsewhere without behavioral disturbance: Secondary | ICD-10-CM | POA: Diagnosis not present

## 2017-03-08 DIAGNOSIS — I5042 Chronic combined systolic (congestive) and diastolic (congestive) heart failure: Secondary | ICD-10-CM | POA: Diagnosis not present

## 2017-03-11 DIAGNOSIS — G40309 Generalized idiopathic epilepsy and epileptic syndromes, not intractable, without status epilepticus: Secondary | ICD-10-CM | POA: Diagnosis not present

## 2017-03-11 DIAGNOSIS — S065X0D Traumatic subdural hemorrhage without loss of consciousness, subsequent encounter: Secondary | ICD-10-CM | POA: Diagnosis not present

## 2017-03-11 DIAGNOSIS — J209 Acute bronchitis, unspecified: Secondary | ICD-10-CM | POA: Diagnosis not present

## 2017-03-11 DIAGNOSIS — F0390 Unspecified dementia without behavioral disturbance: Secondary | ICD-10-CM | POA: Diagnosis not present

## 2017-03-14 DIAGNOSIS — I130 Hypertensive heart and chronic kidney disease with heart failure and stage 1 through stage 4 chronic kidney disease, or unspecified chronic kidney disease: Secondary | ICD-10-CM | POA: Diagnosis not present

## 2017-03-14 DIAGNOSIS — S065X0D Traumatic subdural hemorrhage without loss of consciousness, subsequent encounter: Secondary | ICD-10-CM | POA: Diagnosis not present

## 2017-03-14 DIAGNOSIS — I5042 Chronic combined systolic (congestive) and diastolic (congestive) heart failure: Secondary | ICD-10-CM | POA: Diagnosis not present

## 2017-03-14 DIAGNOSIS — E1122 Type 2 diabetes mellitus with diabetic chronic kidney disease: Secondary | ICD-10-CM | POA: Diagnosis not present

## 2017-03-14 DIAGNOSIS — G309 Alzheimer's disease, unspecified: Secondary | ICD-10-CM | POA: Diagnosis not present

## 2017-03-14 DIAGNOSIS — F0280 Dementia in other diseases classified elsewhere without behavioral disturbance: Secondary | ICD-10-CM | POA: Diagnosis not present

## 2017-03-17 DIAGNOSIS — S065X0D Traumatic subdural hemorrhage without loss of consciousness, subsequent encounter: Secondary | ICD-10-CM | POA: Diagnosis not present

## 2017-03-17 DIAGNOSIS — E1122 Type 2 diabetes mellitus with diabetic chronic kidney disease: Secondary | ICD-10-CM | POA: Diagnosis not present

## 2017-03-17 DIAGNOSIS — G309 Alzheimer's disease, unspecified: Secondary | ICD-10-CM | POA: Diagnosis not present

## 2017-03-17 DIAGNOSIS — I5042 Chronic combined systolic (congestive) and diastolic (congestive) heart failure: Secondary | ICD-10-CM | POA: Diagnosis not present

## 2017-03-17 DIAGNOSIS — I130 Hypertensive heart and chronic kidney disease with heart failure and stage 1 through stage 4 chronic kidney disease, or unspecified chronic kidney disease: Secondary | ICD-10-CM | POA: Diagnosis not present

## 2017-03-17 DIAGNOSIS — F0280 Dementia in other diseases classified elsewhere without behavioral disturbance: Secondary | ICD-10-CM | POA: Diagnosis not present

## 2017-03-21 DIAGNOSIS — E1122 Type 2 diabetes mellitus with diabetic chronic kidney disease: Secondary | ICD-10-CM | POA: Diagnosis not present

## 2017-03-21 DIAGNOSIS — I130 Hypertensive heart and chronic kidney disease with heart failure and stage 1 through stage 4 chronic kidney disease, or unspecified chronic kidney disease: Secondary | ICD-10-CM | POA: Diagnosis not present

## 2017-03-21 DIAGNOSIS — F0280 Dementia in other diseases classified elsewhere without behavioral disturbance: Secondary | ICD-10-CM | POA: Diagnosis not present

## 2017-03-21 DIAGNOSIS — G309 Alzheimer's disease, unspecified: Secondary | ICD-10-CM | POA: Diagnosis not present

## 2017-03-21 DIAGNOSIS — S065X0D Traumatic subdural hemorrhage without loss of consciousness, subsequent encounter: Secondary | ICD-10-CM | POA: Diagnosis not present

## 2017-03-21 DIAGNOSIS — I5042 Chronic combined systolic (congestive) and diastolic (congestive) heart failure: Secondary | ICD-10-CM | POA: Diagnosis not present

## 2017-03-22 DIAGNOSIS — M6281 Muscle weakness (generalized): Secondary | ICD-10-CM | POA: Diagnosis not present

## 2017-03-22 DIAGNOSIS — I10 Essential (primary) hypertension: Secondary | ICD-10-CM | POA: Diagnosis not present

## 2017-03-22 DIAGNOSIS — Z9181 History of falling: Secondary | ICD-10-CM | POA: Diagnosis not present

## 2017-03-22 DIAGNOSIS — E785 Hyperlipidemia, unspecified: Secondary | ICD-10-CM | POA: Diagnosis not present

## 2017-03-22 DIAGNOSIS — R2689 Other abnormalities of gait and mobility: Secondary | ICD-10-CM | POA: Diagnosis not present

## 2017-03-22 DIAGNOSIS — I739 Peripheral vascular disease, unspecified: Secondary | ICD-10-CM | POA: Diagnosis not present

## 2017-03-22 DIAGNOSIS — G8929 Other chronic pain: Secondary | ICD-10-CM | POA: Diagnosis not present

## 2017-03-22 DIAGNOSIS — F0281 Dementia in other diseases classified elsewhere with behavioral disturbance: Secondary | ICD-10-CM | POA: Diagnosis not present

## 2017-03-22 DIAGNOSIS — E119 Type 2 diabetes mellitus without complications: Secondary | ICD-10-CM | POA: Diagnosis not present

## 2017-03-22 DIAGNOSIS — R5381 Other malaise: Secondary | ICD-10-CM | POA: Diagnosis not present

## 2017-03-23 DIAGNOSIS — I130 Hypertensive heart and chronic kidney disease with heart failure and stage 1 through stage 4 chronic kidney disease, or unspecified chronic kidney disease: Secondary | ICD-10-CM | POA: Diagnosis not present

## 2017-03-23 DIAGNOSIS — I5042 Chronic combined systolic (congestive) and diastolic (congestive) heart failure: Secondary | ICD-10-CM | POA: Diagnosis not present

## 2017-03-23 DIAGNOSIS — E1122 Type 2 diabetes mellitus with diabetic chronic kidney disease: Secondary | ICD-10-CM | POA: Diagnosis not present

## 2017-03-23 DIAGNOSIS — G309 Alzheimer's disease, unspecified: Secondary | ICD-10-CM | POA: Diagnosis not present

## 2017-03-23 DIAGNOSIS — F0280 Dementia in other diseases classified elsewhere without behavioral disturbance: Secondary | ICD-10-CM | POA: Diagnosis not present

## 2017-03-23 DIAGNOSIS — S065X0D Traumatic subdural hemorrhage without loss of consciousness, subsequent encounter: Secondary | ICD-10-CM | POA: Diagnosis not present

## 2017-03-29 DIAGNOSIS — I130 Hypertensive heart and chronic kidney disease with heart failure and stage 1 through stage 4 chronic kidney disease, or unspecified chronic kidney disease: Secondary | ICD-10-CM | POA: Diagnosis not present

## 2017-03-29 DIAGNOSIS — I5042 Chronic combined systolic (congestive) and diastolic (congestive) heart failure: Secondary | ICD-10-CM | POA: Diagnosis not present

## 2017-03-29 DIAGNOSIS — F0280 Dementia in other diseases classified elsewhere without behavioral disturbance: Secondary | ICD-10-CM | POA: Diagnosis not present

## 2017-03-29 DIAGNOSIS — E1122 Type 2 diabetes mellitus with diabetic chronic kidney disease: Secondary | ICD-10-CM | POA: Diagnosis not present

## 2017-03-29 DIAGNOSIS — G309 Alzheimer's disease, unspecified: Secondary | ICD-10-CM | POA: Diagnosis not present

## 2017-03-29 DIAGNOSIS — S065X0D Traumatic subdural hemorrhage without loss of consciousness, subsequent encounter: Secondary | ICD-10-CM | POA: Diagnosis not present

## 2017-03-31 DIAGNOSIS — E1122 Type 2 diabetes mellitus with diabetic chronic kidney disease: Secondary | ICD-10-CM | POA: Diagnosis not present

## 2017-03-31 DIAGNOSIS — F0280 Dementia in other diseases classified elsewhere without behavioral disturbance: Secondary | ICD-10-CM | POA: Diagnosis not present

## 2017-03-31 DIAGNOSIS — I5042 Chronic combined systolic (congestive) and diastolic (congestive) heart failure: Secondary | ICD-10-CM | POA: Diagnosis not present

## 2017-03-31 DIAGNOSIS — I130 Hypertensive heart and chronic kidney disease with heart failure and stage 1 through stage 4 chronic kidney disease, or unspecified chronic kidney disease: Secondary | ICD-10-CM | POA: Diagnosis not present

## 2017-03-31 DIAGNOSIS — G309 Alzheimer's disease, unspecified: Secondary | ICD-10-CM | POA: Diagnosis not present

## 2017-03-31 DIAGNOSIS — S065X0D Traumatic subdural hemorrhage without loss of consciousness, subsequent encounter: Secondary | ICD-10-CM | POA: Diagnosis not present

## 2017-04-01 DIAGNOSIS — E1122 Type 2 diabetes mellitus with diabetic chronic kidney disease: Secondary | ICD-10-CM | POA: Diagnosis not present

## 2017-04-01 DIAGNOSIS — F0280 Dementia in other diseases classified elsewhere without behavioral disturbance: Secondary | ICD-10-CM | POA: Diagnosis not present

## 2017-04-01 DIAGNOSIS — G309 Alzheimer's disease, unspecified: Secondary | ICD-10-CM | POA: Diagnosis not present

## 2017-04-01 DIAGNOSIS — I5042 Chronic combined systolic (congestive) and diastolic (congestive) heart failure: Secondary | ICD-10-CM | POA: Diagnosis not present

## 2017-04-01 DIAGNOSIS — N183 Chronic kidney disease, stage 3 (moderate): Secondary | ICD-10-CM | POA: Diagnosis not present

## 2017-04-01 DIAGNOSIS — S065X0D Traumatic subdural hemorrhage without loss of consciousness, subsequent encounter: Secondary | ICD-10-CM | POA: Diagnosis not present

## 2017-04-01 DIAGNOSIS — Z950 Presence of cardiac pacemaker: Secondary | ICD-10-CM | POA: Diagnosis not present

## 2017-04-01 DIAGNOSIS — I130 Hypertensive heart and chronic kidney disease with heart failure and stage 1 through stage 4 chronic kidney disease, or unspecified chronic kidney disease: Secondary | ICD-10-CM | POA: Diagnosis not present

## 2017-04-01 DIAGNOSIS — Z9981 Dependence on supplemental oxygen: Secondary | ICD-10-CM | POA: Diagnosis not present

## 2017-04-01 DIAGNOSIS — E1140 Type 2 diabetes mellitus with diabetic neuropathy, unspecified: Secondary | ICD-10-CM | POA: Diagnosis not present

## 2017-04-01 DIAGNOSIS — S51012D Laceration without foreign body of left elbow, subsequent encounter: Secondary | ICD-10-CM | POA: Diagnosis not present

## 2017-04-01 DIAGNOSIS — D631 Anemia in chronic kidney disease: Secondary | ICD-10-CM | POA: Diagnosis not present

## 2017-04-01 DIAGNOSIS — Z515 Encounter for palliative care: Secondary | ICD-10-CM | POA: Diagnosis not present

## 2017-04-04 DIAGNOSIS — I130 Hypertensive heart and chronic kidney disease with heart failure and stage 1 through stage 4 chronic kidney disease, or unspecified chronic kidney disease: Secondary | ICD-10-CM | POA: Diagnosis not present

## 2017-04-04 DIAGNOSIS — E1122 Type 2 diabetes mellitus with diabetic chronic kidney disease: Secondary | ICD-10-CM | POA: Diagnosis not present

## 2017-04-04 DIAGNOSIS — S065X0D Traumatic subdural hemorrhage without loss of consciousness, subsequent encounter: Secondary | ICD-10-CM | POA: Diagnosis not present

## 2017-04-04 DIAGNOSIS — F0280 Dementia in other diseases classified elsewhere without behavioral disturbance: Secondary | ICD-10-CM | POA: Diagnosis not present

## 2017-04-04 DIAGNOSIS — I5042 Chronic combined systolic (congestive) and diastolic (congestive) heart failure: Secondary | ICD-10-CM | POA: Diagnosis not present

## 2017-04-04 DIAGNOSIS — G309 Alzheimer's disease, unspecified: Secondary | ICD-10-CM | POA: Diagnosis not present

## 2017-04-06 DIAGNOSIS — I130 Hypertensive heart and chronic kidney disease with heart failure and stage 1 through stage 4 chronic kidney disease, or unspecified chronic kidney disease: Secondary | ICD-10-CM | POA: Diagnosis not present

## 2017-04-06 DIAGNOSIS — I779 Disorder of arteries and arterioles, unspecified: Secondary | ICD-10-CM | POA: Diagnosis not present

## 2017-04-06 DIAGNOSIS — F0280 Dementia in other diseases classified elsewhere without behavioral disturbance: Secondary | ICD-10-CM | POA: Diagnosis not present

## 2017-04-06 DIAGNOSIS — E119 Type 2 diabetes mellitus without complications: Secondary | ICD-10-CM | POA: Diagnosis not present

## 2017-04-06 DIAGNOSIS — G40309 Generalized idiopathic epilepsy and epileptic syndromes, not intractable, without status epilepticus: Secondary | ICD-10-CM | POA: Diagnosis not present

## 2017-04-06 DIAGNOSIS — S065X0D Traumatic subdural hemorrhage without loss of consciousness, subsequent encounter: Secondary | ICD-10-CM | POA: Diagnosis not present

## 2017-04-06 DIAGNOSIS — I5042 Chronic combined systolic (congestive) and diastolic (congestive) heart failure: Secondary | ICD-10-CM | POA: Diagnosis not present

## 2017-04-06 DIAGNOSIS — G309 Alzheimer's disease, unspecified: Secondary | ICD-10-CM | POA: Diagnosis not present

## 2017-04-06 DIAGNOSIS — E1122 Type 2 diabetes mellitus with diabetic chronic kidney disease: Secondary | ICD-10-CM | POA: Diagnosis not present

## 2017-04-08 DIAGNOSIS — I5042 Chronic combined systolic (congestive) and diastolic (congestive) heart failure: Secondary | ICD-10-CM | POA: Diagnosis not present

## 2017-04-08 DIAGNOSIS — F0280 Dementia in other diseases classified elsewhere without behavioral disturbance: Secondary | ICD-10-CM | POA: Diagnosis not present

## 2017-04-08 DIAGNOSIS — S065X0D Traumatic subdural hemorrhage without loss of consciousness, subsequent encounter: Secondary | ICD-10-CM | POA: Diagnosis not present

## 2017-04-08 DIAGNOSIS — E1122 Type 2 diabetes mellitus with diabetic chronic kidney disease: Secondary | ICD-10-CM | POA: Diagnosis not present

## 2017-04-08 DIAGNOSIS — I130 Hypertensive heart and chronic kidney disease with heart failure and stage 1 through stage 4 chronic kidney disease, or unspecified chronic kidney disease: Secondary | ICD-10-CM | POA: Diagnosis not present

## 2017-04-08 DIAGNOSIS — G309 Alzheimer's disease, unspecified: Secondary | ICD-10-CM | POA: Diagnosis not present

## 2017-04-09 DIAGNOSIS — F0280 Dementia in other diseases classified elsewhere without behavioral disturbance: Secondary | ICD-10-CM | POA: Diagnosis not present

## 2017-04-09 DIAGNOSIS — E1122 Type 2 diabetes mellitus with diabetic chronic kidney disease: Secondary | ICD-10-CM | POA: Diagnosis not present

## 2017-04-09 DIAGNOSIS — G309 Alzheimer's disease, unspecified: Secondary | ICD-10-CM | POA: Diagnosis not present

## 2017-04-09 DIAGNOSIS — I5042 Chronic combined systolic (congestive) and diastolic (congestive) heart failure: Secondary | ICD-10-CM | POA: Diagnosis not present

## 2017-04-09 DIAGNOSIS — S065X0D Traumatic subdural hemorrhage without loss of consciousness, subsequent encounter: Secondary | ICD-10-CM | POA: Diagnosis not present

## 2017-04-09 DIAGNOSIS — I130 Hypertensive heart and chronic kidney disease with heart failure and stage 1 through stage 4 chronic kidney disease, or unspecified chronic kidney disease: Secondary | ICD-10-CM | POA: Diagnosis not present

## 2017-04-12 DIAGNOSIS — I130 Hypertensive heart and chronic kidney disease with heart failure and stage 1 through stage 4 chronic kidney disease, or unspecified chronic kidney disease: Secondary | ICD-10-CM | POA: Diagnosis not present

## 2017-04-12 DIAGNOSIS — E1122 Type 2 diabetes mellitus with diabetic chronic kidney disease: Secondary | ICD-10-CM | POA: Diagnosis not present

## 2017-04-12 DIAGNOSIS — F0280 Dementia in other diseases classified elsewhere without behavioral disturbance: Secondary | ICD-10-CM | POA: Diagnosis not present

## 2017-04-12 DIAGNOSIS — G309 Alzheimer's disease, unspecified: Secondary | ICD-10-CM | POA: Diagnosis not present

## 2017-04-12 DIAGNOSIS — S065X0D Traumatic subdural hemorrhage without loss of consciousness, subsequent encounter: Secondary | ICD-10-CM | POA: Diagnosis not present

## 2017-04-12 DIAGNOSIS — I5042 Chronic combined systolic (congestive) and diastolic (congestive) heart failure: Secondary | ICD-10-CM | POA: Diagnosis not present

## 2017-04-13 DIAGNOSIS — I130 Hypertensive heart and chronic kidney disease with heart failure and stage 1 through stage 4 chronic kidney disease, or unspecified chronic kidney disease: Secondary | ICD-10-CM | POA: Diagnosis not present

## 2017-04-13 DIAGNOSIS — S065X0D Traumatic subdural hemorrhage without loss of consciousness, subsequent encounter: Secondary | ICD-10-CM | POA: Diagnosis not present

## 2017-04-13 DIAGNOSIS — I5042 Chronic combined systolic (congestive) and diastolic (congestive) heart failure: Secondary | ICD-10-CM | POA: Diagnosis not present

## 2017-04-13 DIAGNOSIS — F0280 Dementia in other diseases classified elsewhere without behavioral disturbance: Secondary | ICD-10-CM | POA: Diagnosis not present

## 2017-04-13 DIAGNOSIS — E1122 Type 2 diabetes mellitus with diabetic chronic kidney disease: Secondary | ICD-10-CM | POA: Diagnosis not present

## 2017-04-13 DIAGNOSIS — G309 Alzheimer's disease, unspecified: Secondary | ICD-10-CM | POA: Diagnosis not present

## 2017-05-02 DEATH — deceased

## 2017-05-04 IMAGING — CT CT HEAD W/O CM
4 series · 15 of 47 positions shown, 17 images · non-contrast
Comparison: 08/10/2015 and earlier.

CLINICAL DATA: 87-year-old male with left subdural hematoma [REDACTED]. Initial encounter.

EXAM:
CT HEAD WITHOUT CONTRAST
TECHNIQUE: Contiguous axial images were obtained from the base of the skull
through the vertex without intravenous contrast.

[Series 2: head without · axial · non-contrast · 0.41mm/px · z∈[-67,+53]mm · 7 of 33 slices shown, 9 images]
[im 5/33  brain]
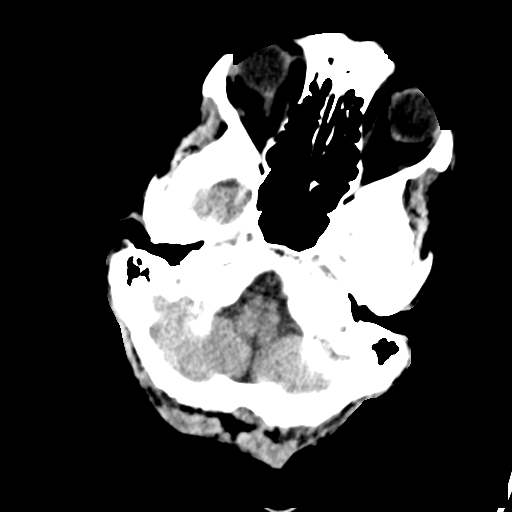
[im 5/33  bone]
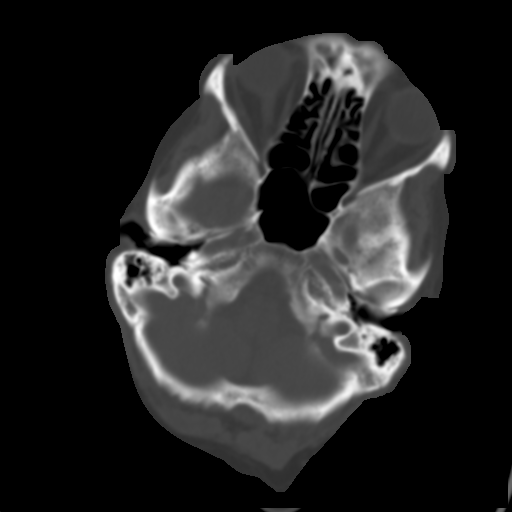
[im 9/33  brain]
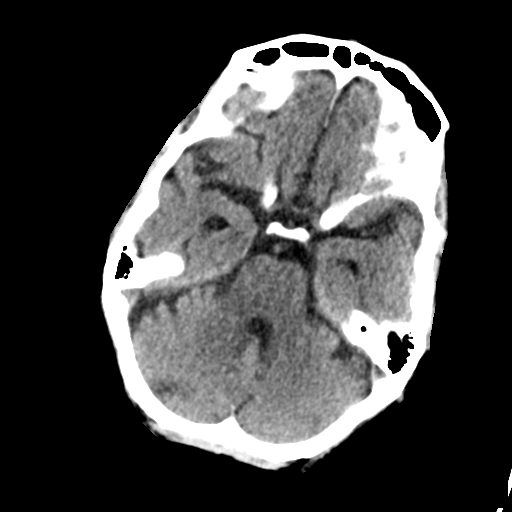
[im 13/33  brain]
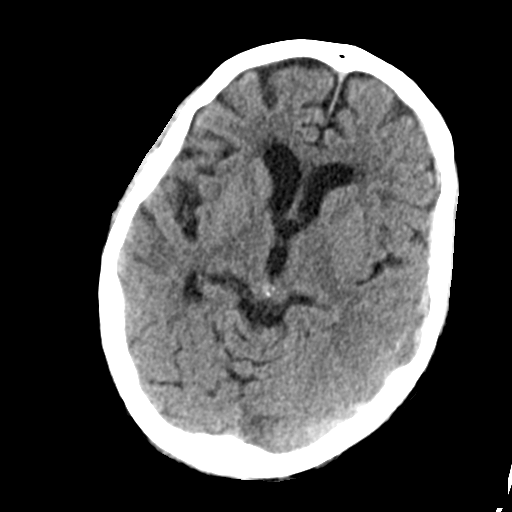
[im 17/33  brain]
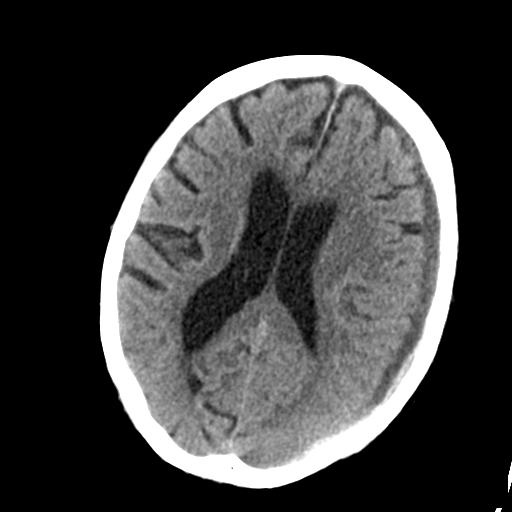
[im 21/33  brain]
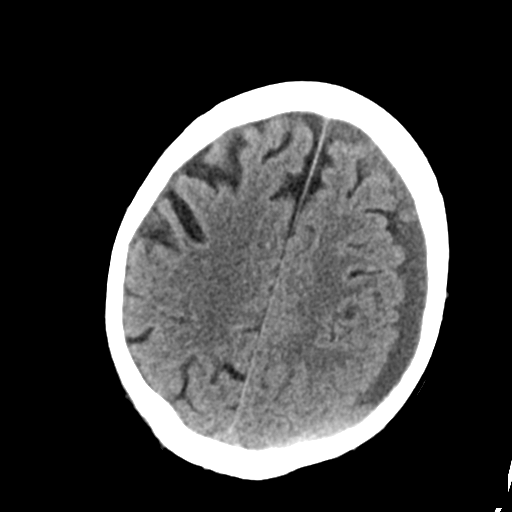
[im 21/33  bone]
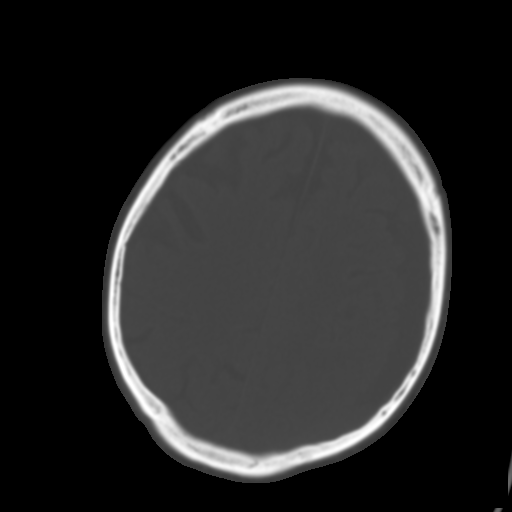
[im 25/33  brain]
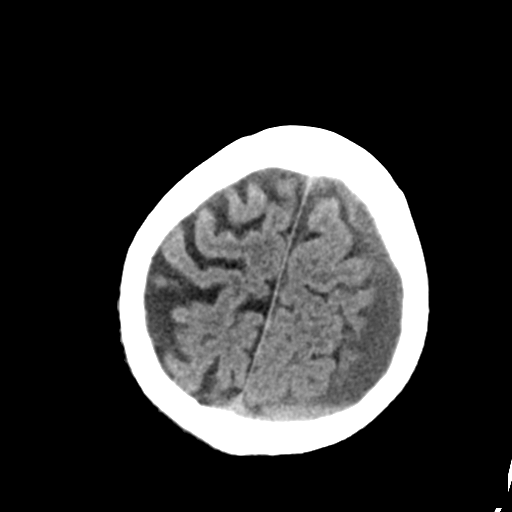
[im 29/33  brain]
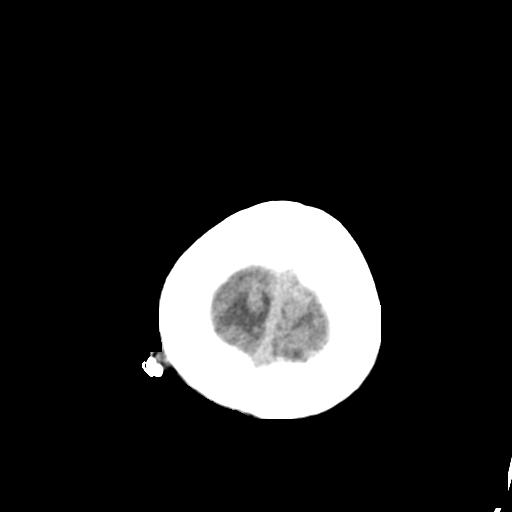

[Series 3: head bone · axial · 0.41mm/px · z∈[-71,-55]mm · 2 of 81 slices shown]
[im 9/81  bone]
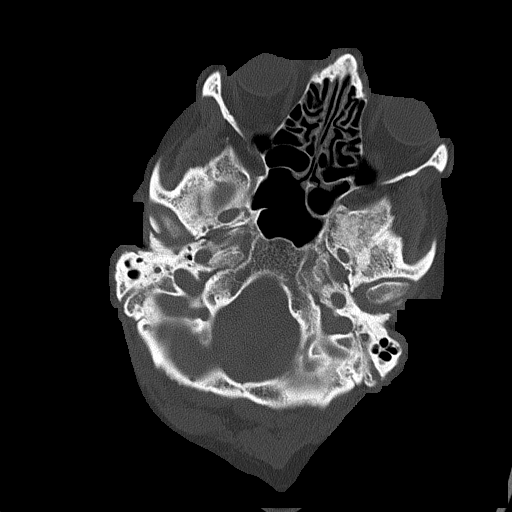
[im 17/81  bone]
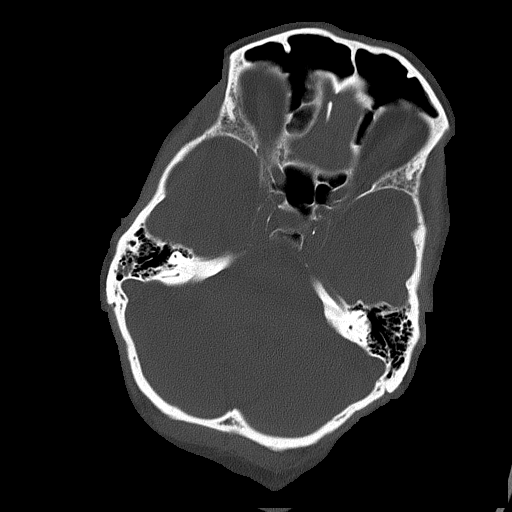

[Series 4: head without cor · coronal · non-contrast · 0.32mm/px · 3 of 67 slices shown]
[im 23/67  brain]
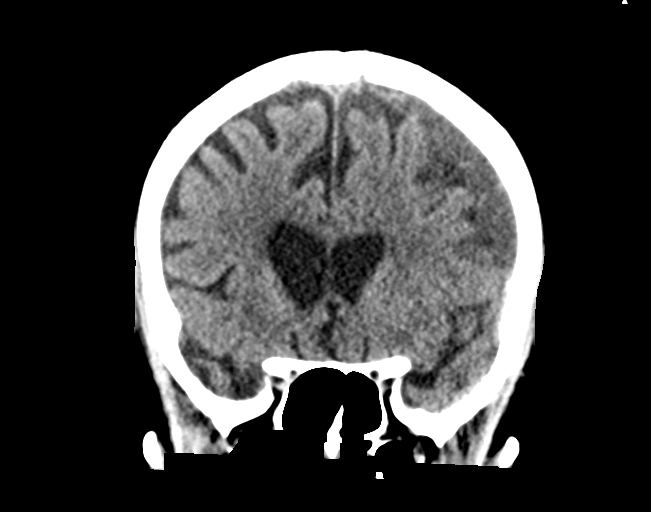
[im 30/67  brain]
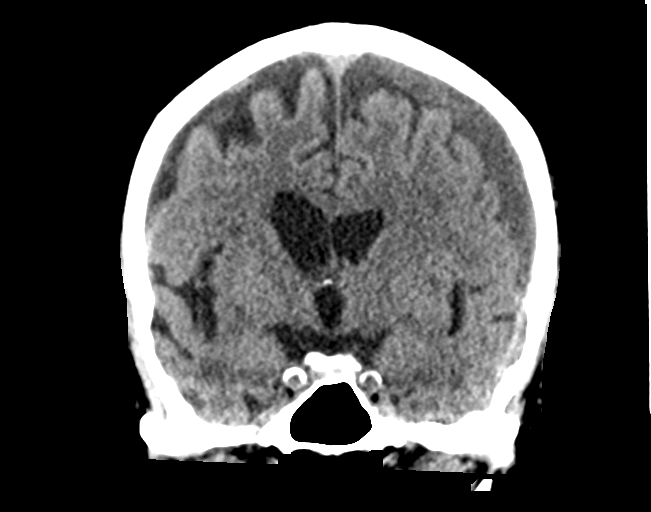
[im 37/67  brain]
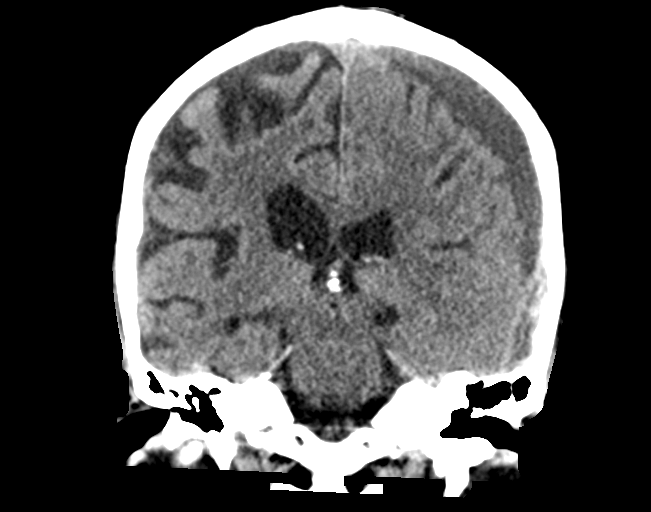

[Series 5: head without sag · sagittal · non-contrast · 0.33mm/px · 3 of 51 slices shown]
[im 17/51  brain]
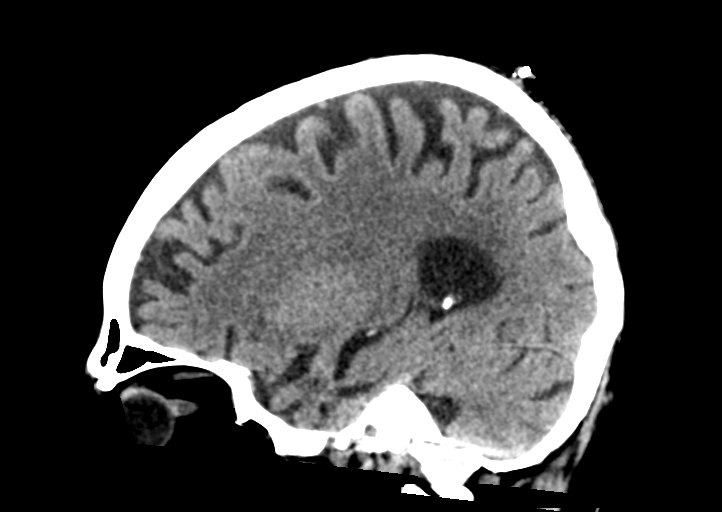
[im 26/51  brain]
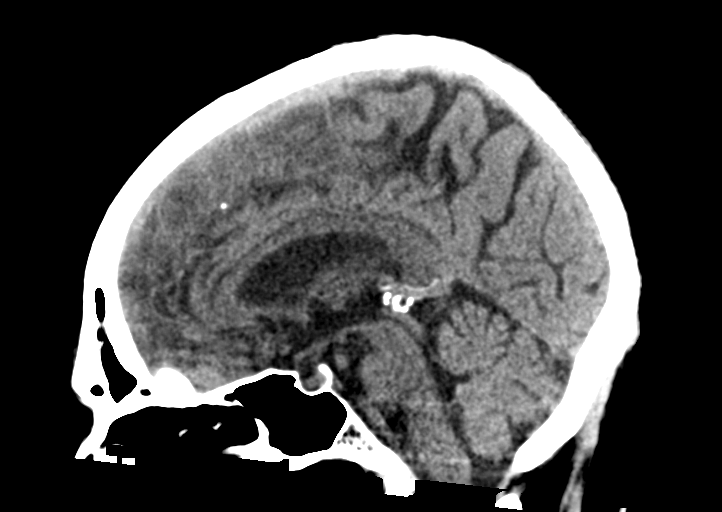
[im 34/51  brain]
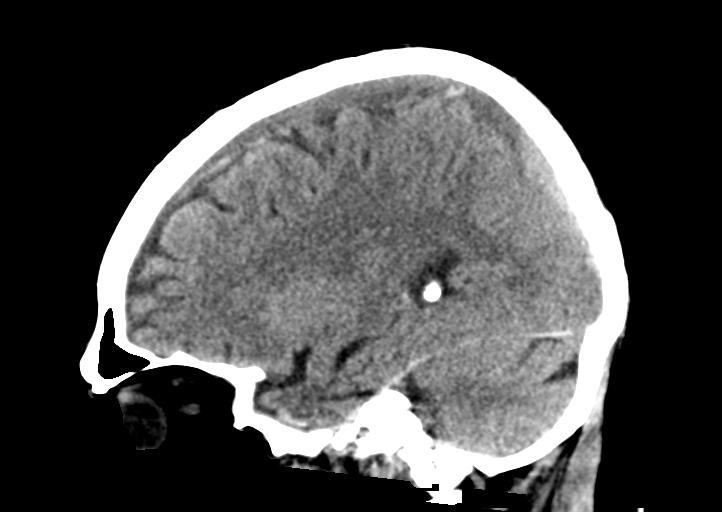

[15 of 47 positions shown; findings below may reference images not displayed]

FINDINGS: Visualized paranasal sinuses and mastoids are stable and well
pneumatized. No acute orbit or scalp soft tissue findings. Stable
skin staples along the right vertex. Calvarium intact. No acute
osseous abnormality identified.

Mixed density the mostly low-density left side subdural hematoma
measures up to 11 mm in thickness now, versus 8-9 mm previously. The
subdural is largest along the posterior convexity. Anteriorly the
hemorrhage measures more like 7-8 mm, stable. Rightward midline
shift of 4 mm is increased (series 2, image 17). Mass effect on the
left lateral ventricle is not significantly changed. No
ventriculomegaly.

No other extra-axial collection or new intracranial hemorrhage.
Stable gray-white matter differentiation throughout the brain. No
cortically based acute infarct identified. No suspicious
intracranial vascular hyperdensity. Calcified atherosclerosis at the
skull base.
IMPRESSION: 1. Progressed left subdural hematoma since 08/10/2015. Mildly
increased rightward midline shift of 4 mm.
2. No new intracranial abnormality.
